# Patient Record
Sex: Male | Born: 1955
Health system: Southern US, Community
[De-identification: ages and names within clinical notes are randomized; demographics above are authoritative.]

## PROBLEM LIST (undated history)

## (undated) DIAGNOSIS — F329 Major depressive disorder, single episode, unspecified: Secondary | ICD-10-CM

## (undated) DIAGNOSIS — I5020 Unspecified systolic (congestive) heart failure: Secondary | ICD-10-CM

## (undated) DIAGNOSIS — E119 Type 2 diabetes mellitus without complications: Secondary | ICD-10-CM

## (undated) DIAGNOSIS — M1990 Unspecified osteoarthritis, unspecified site: Secondary | ICD-10-CM

## (undated) DIAGNOSIS — I10 Essential (primary) hypertension: Secondary | ICD-10-CM

## (undated) DIAGNOSIS — I2510 Atherosclerotic heart disease of native coronary artery without angina pectoris: Secondary | ICD-10-CM

## (undated) DIAGNOSIS — E785 Hyperlipidemia, unspecified: Secondary | ICD-10-CM

## (undated) DIAGNOSIS — F32A Depression, unspecified: Secondary | ICD-10-CM

## (undated) DIAGNOSIS — N183 Chronic kidney disease, stage 3 unspecified: Secondary | ICD-10-CM

## (undated) DIAGNOSIS — I77810 Thoracic aortic ectasia: Secondary | ICD-10-CM

## (undated) DIAGNOSIS — I255 Ischemic cardiomyopathy: Secondary | ICD-10-CM

## (undated) HISTORY — DX: Unspecified osteoarthritis, unspecified site: M19.90

## (undated) HISTORY — DX: Hyperlipidemia, unspecified: E78.5

## (undated) HISTORY — PX: CATARACT EXTRACTION: SUR2

## (undated) HISTORY — PX: TONSILLECTOMY: SUR1361

## (undated) HISTORY — DX: Depression, unspecified: F32.A

## (undated) HISTORY — DX: Essential (primary) hypertension: I10

## (undated) HISTORY — DX: Type 2 diabetes mellitus without complications: E11.9

---
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
## 2017-04-08 LAB — HM HEPATITIS C SCREENING LAB: HM Hepatitis Screen: NEGATIVE

## 2020-02-11 ENCOUNTER — Telehealth: Payer: Self-pay

## 2020-02-11 ENCOUNTER — Encounter (INDEPENDENT_AMBULATORY_CARE_PROVIDER_SITE_OTHER): Payer: Self-pay

## 2020-02-11 ENCOUNTER — Other Ambulatory Visit: Payer: Self-pay

## 2020-02-11 ENCOUNTER — Ambulatory Visit (INDEPENDENT_AMBULATORY_CARE_PROVIDER_SITE_OTHER): Payer: Medicare Other | Admitting: Physician Assistant

## 2020-02-11 ENCOUNTER — Encounter: Payer: Self-pay | Admitting: Physician Assistant

## 2020-02-11 VITALS — BP 138/80 | HR 77 | Temp 98.2°F | Resp 16 | Ht 70.0 in | Wt 248.0 lb

## 2020-02-11 DIAGNOSIS — IMO0002 Reserved for concepts with insufficient information to code with codable children: Secondary | ICD-10-CM

## 2020-02-11 DIAGNOSIS — E785 Hyperlipidemia, unspecified: Secondary | ICD-10-CM

## 2020-02-11 DIAGNOSIS — B070 Plantar wart: Secondary | ICD-10-CM

## 2020-02-11 DIAGNOSIS — E1122 Type 2 diabetes mellitus with diabetic chronic kidney disease: Secondary | ICD-10-CM

## 2020-02-11 DIAGNOSIS — I10 Essential (primary) hypertension: Secondary | ICD-10-CM

## 2020-02-11 DIAGNOSIS — Z794 Long term (current) use of insulin: Secondary | ICD-10-CM

## 2020-02-11 DIAGNOSIS — E1159 Type 2 diabetes mellitus with other circulatory complications: Secondary | ICD-10-CM

## 2020-02-11 DIAGNOSIS — E1140 Type 2 diabetes mellitus with diabetic neuropathy, unspecified: Secondary | ICD-10-CM | POA: Diagnosis not present

## 2020-02-11 DIAGNOSIS — E1165 Type 2 diabetes mellitus with hyperglycemia: Secondary | ICD-10-CM | POA: Diagnosis not present

## 2020-02-11 DIAGNOSIS — I152 Hypertension secondary to endocrine disorders: Secondary | ICD-10-CM

## 2020-02-11 DIAGNOSIS — Z125 Encounter for screening for malignant neoplasm of prostate: Secondary | ICD-10-CM

## 2020-02-11 DIAGNOSIS — E1169 Type 2 diabetes mellitus with other specified complication: Secondary | ICD-10-CM | POA: Diagnosis not present

## 2020-02-11 LAB — COMPREHENSIVE METABOLIC PANEL
ALT: 17 U/L (ref 0–53)
AST: 15 U/L (ref 0–37)
Albumin: 4.4 g/dL (ref 3.5–5.2)
Alkaline Phosphatase: 80 U/L (ref 39–117)
BUN: 22 mg/dL (ref 6–23)
CO2: 29 mEq/L (ref 19–32)
Calcium: 9.4 mg/dL (ref 8.4–10.5)
Chloride: 96 mEq/L (ref 96–112)
Creatinine, Ser: 1.26 mg/dL (ref 0.40–1.50)
GFR: 57.44 mL/min — ABNORMAL LOW (ref >60.00)
Glucose, Bld: 438 mg/dL — ABNORMAL HIGH (ref 70–99)
Potassium: 4.6 mEq/L (ref 3.5–5.1)
Sodium: 133 mEq/L — ABNORMAL LOW (ref 135–145)
Total Bilirubin: 0.7 mg/dL (ref 0.2–1.2)
Total Protein: 6.8 g/dL (ref 6.0–8.3)

## 2020-02-11 LAB — POCT URINALYSIS DIPSTICK
Bilirubin, UA: NEGATIVE
Blood, UA: NEGATIVE
Glucose, UA: POSITIVE — AB
Ketones, UA: POSITIVE
Leukocytes, UA: NEGATIVE
Nitrite, UA: NEGATIVE
Protein, UA: NEGATIVE
Spec Grav, UA: 1.015 (ref 1.010–1.025)
Urobilinogen, UA: 0.2 E.U./dL
pH, UA: 5.5 (ref 5.0–8.0)

## 2020-02-11 LAB — POCT GLYCOSYLATED HEMOGLOBIN (HGB A1C): Hemoglobin A1C: 14.7 % — AB (ref 4.0–5.6)

## 2020-02-11 LAB — CBC WITH DIFFERENTIAL/PLATELET
Basophils Absolute: 0 10*3/uL (ref 0.0–0.1)
Basophils Relative: 1 % (ref 0.0–3.0)
Eosinophils Absolute: 0.1 10*3/uL (ref 0.0–0.7)
Eosinophils Relative: 1.8 % (ref 0.0–5.0)
HCT: 49.5 % (ref 39.0–52.0)
Hemoglobin: 17.4 g/dL — ABNORMAL HIGH (ref 13.0–17.0)
Lymphocytes Relative: 37 % (ref 12.0–46.0)
Lymphs Abs: 1.7 10*3/uL (ref 0.7–4.0)
MCHC: 35.1 g/dL (ref 30.0–36.0)
MCV: 90.5 fl (ref 78.0–100.0)
Monocytes Absolute: 0.3 10*3/uL (ref 0.1–1.0)
Monocytes Relative: 7.3 % (ref 3.0–12.0)
Neutro Abs: 2.5 10*3/uL (ref 1.4–7.7)
Neutrophils Relative %: 52.9 % (ref 43.0–77.0)
Platelets: 172 10*3/uL (ref 150.0–400.0)
RBC: 5.47 Mil/uL (ref 4.22–5.81)
RDW: 13.3 % (ref 11.5–15.5)
WBC: 4.7 10*3/uL (ref 4.0–10.5)

## 2020-02-11 LAB — MICROALBUMIN / CREATININE URINE RATIO
Creatinine,U: 51.7 mg/dL
Microalb Creat Ratio: 11 mg/g (ref 0.0–30.0)
Microalb, Ur: 5.7 mg/dL — ABNORMAL HIGH (ref 0.0–1.9)

## 2020-02-11 LAB — LIPID PANEL
Cholesterol: 374 mg/dL — ABNORMAL HIGH (ref 0–200)
HDL: 30 mg/dL — ABNORMAL LOW (ref >39.00)
Total CHOL/HDL Ratio: 12
Triglycerides: 1665 mg/dL — ABNORMAL HIGH (ref 0.0–149.0)

## 2020-02-11 LAB — PSA: PSA: 0.68 ng/mL (ref 0.10–4.00)

## 2020-02-11 LAB — TSH: TSH: 2.95 u[IU]/mL (ref 0.35–4.50)

## 2020-02-11 LAB — POCT CBG (FASTING - GLUCOSE)-MANUAL ENTRY: Glucose Fasting, POC: 400 mg/dL — AB (ref 70–99)

## 2020-02-11 LAB — LDL CHOLESTEROL, DIRECT: Direct LDL: 81 mg/dL

## 2020-02-11 MED ORDER — BASAGLAR KWIKPEN 100 UNIT/ML ~~LOC~~ SOPN: 55.0000 [IU] | PEN_INJECTOR | Freq: Every day | SUBCUTANEOUS | 2 refills | Status: DC

## 2020-02-11 MED ORDER — ROSUVASTATIN CALCIUM 40 MG PO TABS: 40.0000 mg | ORAL_TABLET | Freq: Every day | ORAL | 3 refills | Status: DC

## 2020-02-11 MED ORDER — LOSARTAN POTASSIUM 25 MG PO TABS: 25.0000 mg | ORAL_TABLET | Freq: Every day | ORAL | 2 refills | Status: DC

## 2020-02-11 NOTE — Telephone Encounter (Signed)
Patient left a vm message only containing his name and phone number where he could be reached on the nurse's voice mail. Called patient but no answer. Left a vm message for the patient to call the office back.

## 2020-02-11 NOTE — Patient Instructions (Addendum)
Please go to the lab today for blood work.  I will call you with your results. We will alter treatment regimen(s) if indicated by your results.   We will need to get things under control as fast as possible but as safe as possible.   You need to really keep a low carb diet. I am restarting your Basaglar for now at 55 units once daily. We will be adding other agents to this but I need to assess your renal function. Take as directed.  I am sending in a freestyle libre meter for you so it will be easier to get glucose data without all the fingersticks.  I am restarting your losartan once daily and your crestor once daily.  Again we want to make sure you tolerate these things before we keep adding since you have had issue in the past.  I am setting you up with a local Endocrinologist, Nephrologist, Ophthalmologist and [place] will receive calls from these specialists. If you do not hear from them within a week at most, let me know.   I want to follow-up in 2 weeks to assess how things are going. Sooner if needed.    Diabetes Mellitus and Foot Care Foot care is an important part of your health, especially when you have diabetes. Diabetes may cause you to have problems because of poor blood flow (circulation) to your feet and legs, which can cause your skin to:  Become thinner and drier.  Break more easily.  Heal more slowly.  Peel and crack. You may also have nerve damage (neuropathy) in your legs and feet, causing decreased feeling in them. This means that you may not notice minor injuries to your feet that could lead to more serious problems. Noticing and addressing any potential problems early is the best way to prevent future foot problems. How to care for your feet Foot hygiene  Wash your feet daily with warm water and mild soap. Do not use hot water. Then, pat your feet and the areas between your toes until they are completely dry. Do not soak your feet as this can dry your  skin.  Trim your toenails straight across. Do not dig under them or around the cuticle. File the edges of your nails with an emery board or nail file.  Apply a moisturizing lotion or petroleum jelly to the skin on your feet and to dry, brittle toenails. Use lotion that does not contain alcohol and is unscented. Do not apply lotion between your toes. Shoes and socks  Wear clean socks or stockings every day. Make sure they are not too tight. Do not wear knee-high stockings since they may decrease blood flow to your legs.  Wear shoes that fit properly and have enough cushioning. Always look in your shoes before you put them on to be sure there are no objects inside.  To break in new shoes, wear them for just a few hours a day. This prevents injuries on your feet. Wounds, scrapes, corns, and calluses  Check your feet daily for blisters, cuts, bruises, sores, and redness. If you cannot see the bottom of your feet, use a mirror or ask someone for help.  Do not cut corns or calluses or try to remove them with medicine.  If you find a minor scrape, cut, or break in the skin on your feet, keep it and the skin around it clean and dry. You may clean these areas with mild soap and water. Do not clean  the area with peroxide, alcohol, or iodine.  If you have a wound, scrape, corn, or callus on your foot, look at it several times a day to make sure it is healing and not infected. Check for: ? Redness, swelling, or pain. ? Fluid or blood. ? Warmth. ? Pus or a bad smell. General instructions  Do not cross your legs. This may decrease blood flow to your feet.  Do not use heating pads or hot water bottles on your feet. They may burn your skin. If you have lost feeling in your feet or legs, you may not know this is happening until it is too late.  Protect your feet from hot and cold by wearing shoes, such as at the beach or on hot pavement.  Schedule a complete foot exam at least once a year (annually)  or more often if you have foot problems. If you have foot problems, report any cuts, sores, or bruises to your health care provider immediately. Contact a health care provider if:  You have a medical condition that increases your risk of infection and you have any cuts, sores, or bruises on your feet.  You have an injury that is not healing.  You have redness on your legs or feet.  You feel burning or tingling in your legs or feet.  You have pain or cramps in your legs and feet.  Your legs or feet are numb.  Your feet always feel cold.  You have pain around a toenail. Get help right away if:  You have a wound, scrape, corn, or callus on your foot and: ? You have pain, swelling, or redness that gets worse. ? You have fluid or blood coming from the wound, scrape, corn, or callus. ? Your wound, scrape, corn, or callus feels warm to the touch. ? You have pus or a bad smell coming from the wound, scrape, corn, or callus. ? You have a fever. ? You have a red line going up your leg. Summary  Check your feet every day for cuts, sores, red spots, swelling, and blisters.  Moisturize feet and legs daily.  Wear shoes that fit properly and have enough cushioning.  If you have foot problems, report any cuts, sores, or bruises to your health care provider immediately.  Schedule a complete foot exam at least once a year (annually) or more often if you have foot problems. This information is not intended to replace advice given to you by your health care provider. Make sure you discuss any questions you have with your health care provider. Document Revised: 05/20/2019 Document Reviewed: 09/28/2016 Elsevier Patient Education  [place]. Fee

## 2020-02-11 NOTE — Progress Notes (Signed)
Patient presents to clinic today with his wife to establish care. Patient has not seen a PCP since they moved from [place] over 6 months ago.  Per patient and wife, patient with history of DM II with hypertension and hyperlipidemia. Notes he was previously on a multi drug regimen for each condition; metoprolol, losartan and amlodipine for hypertension. Crestor and zetia for hyperlipidemia. Metformin, Novolog, Basaglar, Trulicity and [place] for DM.  Has been off of all medications for at least 6 months. Notes something was causing stomach upset so he stopped all of his medications. Wife notes she encouraged him to hold one medication at a time to see which may have been the culprit. States patient has horrible history of not taking his medications as directed and being non-adherent with treatment regimens.   Patient states he does not check glucose levels as directed. Does note history of some eye changes but unsure of dx of diabetic retinopathy. Is due for repeat eye examination. Wife notes he does have history of kidney disease 2/2 DM. Will need nephrologist in the area. Denies history of neuropathy. Denies current neuropathic symptoms. Was being managed by Endocrinology. Wife states she feels that he was on so many medications because he would tell provider he was taking medications when he really was not, they would note A1C rising and add on more medication.   In regards to hypertension and hyperlipidemia patient states he does try to watch what he eats. Patient denies chest pain, palpitations, lightheadedness, dizziness, vision changes or frequent headaches. Denies history of CVA or MI.    Health Maintenance: Immunizations -- will review records and update immunizations accordingly.  Colonoscopy -- Will need to review records.  Past Medical History:  Diagnosis Date  . Arthritis   . Depression   . Diabetes mellitus without complication ([place])   . Hyperlipidemia   . Hypertension      Past Surgical History:  Procedure Laterality Date  . TONSILLECTOMY      No current outpatient medications on file prior to visit.   No current facility-administered medications on file prior to visit.    Allergies  Allergen Reactions  . Niaspan [Niacin] Other (See Comments)    Red skin, red eyes    Family History  Problem Relation Age of Onset  . Cancer Mother   . Early death Mother   . Stroke Mother   . Diabetes Father   . Heart attack Father   . Heart disease Father   . Hyperlipidemia Father     Social History   Socioeconomic History  . Marital status: Married    Spouse name: Not on file  . Number of children: Not on file  . Years of education: Not on file  . Highest education level: Not on file  Occupational History  . Not on file  Tobacco Use  . Smoking status: Never Smoker  . Smokeless tobacco: Never Used  Vaping Use  . Vaping Use: Never used  Substance and Sexual Activity  . Alcohol use: Not Currently  . Drug use: Never  . Sexual activity: Yes  Other Topics Concern  . Not on file  Social History Narrative  . Not on file   Social Determinants of Health   Financial Resource Strain:   . Difficulty of Paying Living Expenses:   Food Insecurity:   . Worried About Charity fundraiser in the Last Year:   . Arboriculturist in the Last Year:   Transportation Needs:   .  Lack of Transportation (Medical):   Marland Kitchen Lack of Transportation (Non-Medical):   Physical Activity:   . Days of Exercise per Week:   . Minutes of Exercise per Session:   Stress:   . Feeling of Stress :   Social Connections:   . Frequency of Communication with Friends and Family:   . Frequency of Social Gatherings with Friends and Family:   . Attends Religious Services:   . Active Member of Clubs or Organizations:   . Attends Archivist Meetings:   Marland Kitchen Marital Status:   Intimate Partner Violence:   . Fear of Current or Ex-Partner:   . Emotionally Abused:   Marland Kitchen Physically  Abused:   . Sexually Abused:    ROS  Pertinent ROS are listed in the HPI  BP 138/80 (BP Location: Left Arm, Cuff Size: Large)   Pulse 77   Temp 98.2 F (36.8 C) (Temporal)   Resp 16   Ht 5\' 10"  (1.778 m)   Wt 248 lb (112.5 kg)   SpO2 97%   BMI 35.58 kg/m   Physical Exam Vitals reviewed.  Constitutional:      Appearance: Normal appearance.  HENT:     Head: Normocephalic and atraumatic.     Right Ear: Tympanic membrane normal.     Left Ear: Tympanic membrane normal.     Nose: Nose normal.     Mouth/Throat:     Mouth: Mucous membranes are moist.  Eyes:     Conjunctiva/sclera: Conjunctivae normal.     Pupils: Pupils are equal, round, and reactive to light.  Cardiovascular:     Rate and Rhythm: Normal rate and regular rhythm.     Pulses: Normal pulses.     Heart sounds: Normal heart sounds.  Pulmonary:     Effort: Pulmonary effort is normal.     Breath sounds: Normal breath sounds.  Musculoskeletal:     Cervical back: Neck supple.  Neurological:     General: No focal deficit present.     Mental Status: He is alert and oriented to person, place, and time.  Psychiatric:        Mood and Affect: Mood normal.    Diabetic Foot Form - Detailed   Diabetic Foot Exam - detailed Diabetic Foot exam was performed with the following findings: Yes 02/12/2020  7:46 AM  Can the patient see the bottom of their feet?: Yes Are the shoes appropriate in style and fit?: No Is there swelling or and abnormal foot shape?: No Is there a claw toe deformity?: No Is there elevated skin temparature?: No Is there foot or ankle muscle weakness?: No Normal Range of Motion: Yes Right posterior Tibialias: Present Left posterior Tibialias: Present  Right Dorsalis Pedis: Present Left Dorsalis Pedis: Present  Semmes-Weinstein Monofilament Test R Site 1-Great Toe: Pos L Site 1-Great Toe: Pos    Comments: Multiple plantar warts noted of ball of feet bilaterally, most are about 2 mm in diameter but a  few are up to 5 mm in diameter. These are the most symptomatic per patient.      Assessment/Plan: 1. Type 2 diabetes mellitus with diabetic neuropathy, with long-term current use of insulin ([place]) 2. Uncontrolled type II diabetes mellitus with chronic kidney disease ([place]) Discussed with patient that stopping all medications with his chronic conditions is a very bad idea. Think at this point giving all the history given, we are better off starting from scratch. POC A1C at 14.7. Restart Basaglar at 55 units daily. We  will update labs today. I want to see how renal function is at present to determine what other medications are safe to restart. Rx freestyle [place] sent in to get better reads on glucose level -- he is to check TID and record, bringing to follow-up. Referrals to Endo, Ophthalmology, Nephrology and Podiatry placed today. ARB and statin restarted along with 81 mg ASA daily. Evidence of neuropathy found on diabetic foot examination today. Discussed proper footwear, foot hygiene and monitoring of skin. Close follow-up scheduled to make further changes while awaiting Endocrinology assessment.  - Comprehensive metabolic panel - Lipid panel - Urine Microalbumin w/creat. ratio - Ambulatory referral to Podiatry - Ambulatory referral to Endocrinology - Ambulatory referral to Ophthalmology - POCT CBG (Fasting - Glucose) - POCT Urinalysis Dipstick  3. Hypertension associated with diabetes ([place]) BP with only mild elevation. Will restart losartan at prior dose. Start DASH diet. Labs as noted below. Want to obtain/review prior records to see if the other agents were solely for HTN or if there are other compelling indications that I am unaware of. Will adjust according to findings. Follow-up for repeat assessment of BP scheduled.  - CBC with Differential/Platelet - Comprehensive metabolic panel - Lipid panel - TSH  4. Hyperlipidemia associated with type 2 diabetes mellitus ([place]) Restart Crestor at  prior dose. Begin 81 mg ASA daily. Fasting labs today. - POCT HgB A1C - Comprehensive metabolic panel - Lipid panel  5. Prostate cancer screening The meaning of a false positive PSA and a false negative PSA has been discussed. He indicates understanding of the limitations of this screening test and wishes to proceed with screening PSA testing.  - PSA  6. Plantar warts Noted on diabetic foot examination today. Patient and wife have been trying to cut out themselves. Directed to stop immediately due to risk of diabetic foot infection. No sign of infection today. Will need referral to Podiatry for diabetic foot care and removal of warts. 1 application of Cryotherapy applied today in office to 4 larger lesions to help them shrink prior to podiatry management. Home care reviewed with patient and wife.  - Ambulatory referral to [place], PA-C

## 2020-02-17 ENCOUNTER — Telehealth: Payer: Self-pay

## 2020-02-17 DIAGNOSIS — IMO0002 Reserved for concepts with insufficient information to code with codable children: Secondary | ICD-10-CM

## 2020-02-17 MED ORDER — FREESTYLE LIBRE 14 DAY SENSOR MISC: 3 refills | Status: AC

## 2020-02-17 MED ORDER — FREESTYLE LIBRE 14 DAY READER DEVI: 0 refills | Status: AC

## 2020-02-17 NOTE — Telephone Encounter (Signed)
Rx for the meter and sensors has been sent.

## 2020-02-17 NOTE — Telephone Encounter (Signed)
Patient said he needs a prescription sent to the pharmacy for a [facility].

## 2020-02-17 NOTE — Telephone Encounter (Signed)
Called patient made aware that prescription had been sent to pharmacy

## 2020-02-22 DIAGNOSIS — E785 Hyperlipidemia, unspecified: Secondary | ICD-10-CM | POA: Insufficient documentation

## 2020-02-22 DIAGNOSIS — IMO0002 Reserved for concepts with insufficient information to code with codable children: Secondary | ICD-10-CM | POA: Insufficient documentation

## 2020-02-22 DIAGNOSIS — I152 Hypertension secondary to endocrine disorders: Secondary | ICD-10-CM | POA: Insufficient documentation

## 2020-02-22 DIAGNOSIS — E1169 Type 2 diabetes mellitus with other specified complication: Secondary | ICD-10-CM | POA: Insufficient documentation

## 2020-02-22 DIAGNOSIS — E119 Type 2 diabetes mellitus without complications: Secondary | ICD-10-CM | POA: Insufficient documentation

## 2020-02-22 DIAGNOSIS — E1140 Type 2 diabetes mellitus with diabetic neuropathy, unspecified: Secondary | ICD-10-CM | POA: Insufficient documentation

## 2020-02-22 DIAGNOSIS — B070 Plantar wart: Secondary | ICD-10-CM | POA: Insufficient documentation

## 2020-02-22 DIAGNOSIS — Z125 Encounter for screening for malignant neoplasm of prostate: Secondary | ICD-10-CM | POA: Insufficient documentation

## 2020-02-22 DIAGNOSIS — E1159 Type 2 diabetes mellitus with other circulatory complications: Secondary | ICD-10-CM | POA: Insufficient documentation

## 2020-02-25 ENCOUNTER — Encounter: Payer: Self-pay | Admitting: Physician Assistant

## 2020-02-25 ENCOUNTER — Ambulatory Visit (INDEPENDENT_AMBULATORY_CARE_PROVIDER_SITE_OTHER): Payer: Medicare Other | Admitting: Physician Assistant

## 2020-02-25 ENCOUNTER — Other Ambulatory Visit: Payer: Self-pay

## 2020-02-25 VITALS — BP 140/78 | HR 64 | Temp 97.6°F | Wt 258.4 lb

## 2020-02-25 DIAGNOSIS — E1140 Type 2 diabetes mellitus with diabetic neuropathy, unspecified: Secondary | ICD-10-CM | POA: Diagnosis not present

## 2020-02-25 DIAGNOSIS — E1169 Type 2 diabetes mellitus with other specified complication: Secondary | ICD-10-CM | POA: Diagnosis not present

## 2020-02-25 DIAGNOSIS — I10 Essential (primary) hypertension: Secondary | ICD-10-CM | POA: Diagnosis not present

## 2020-02-25 DIAGNOSIS — E1159 Type 2 diabetes mellitus with other circulatory complications: Secondary | ICD-10-CM

## 2020-02-25 DIAGNOSIS — I152 Hypertension secondary to endocrine disorders: Secondary | ICD-10-CM

## 2020-02-25 DIAGNOSIS — Z794 Long term (current) use of insulin: Secondary | ICD-10-CM

## 2020-02-25 DIAGNOSIS — E785 Hyperlipidemia, unspecified: Secondary | ICD-10-CM

## 2020-02-25 MED ORDER — LOSARTAN POTASSIUM 50 MG PO TABS
50.0000 mg | ORAL_TABLET | Freq: Every day | ORAL | 0 refills | Status: DC
Start: 2020-02-25 — End: 2020-05-18

## 2020-02-25 NOTE — Progress Notes (Signed)
Patient presents to clinic today for follow-up of chronic medical conditions after being restarted on medication regimen at last visit 2 weeks ago.  Patient with history of type 2 diabetes uncontrolled with chronic kidney disease and neuropathy, hyperlipidemia and hypertension.  Patient had stopped all of his medicines due to nausea, unsure which was the culprit.  At last visit labs were obtained, patient was restarted on losartan 25 mg daily, rosuvastatin 40 mg daily, baby aspirin and, and Basaglar 55 units once daily.  A prescription for freestyle libre meter sent in for more consistent glucose checks.  Patient was also referred to endocrinology, ophthalmology, podiatry and nephrology given his uncontrolled diabetes with complications.  Since last visit, patient endorses taking medications as directed and tolerating well.  Did receive his freestyle libre meter but states the sensors he has tried all read error.  As such she has not been able to check glucose frequently. Patient denies chest pain, palpitations, lightheadedness, dizziness, vision changes or frequent headaches.  Patient endorses trying to keep a lower carb diet.  Is keeping well-hydrated.  Notes staying very active at work.  Patient already has appointment scheduled with podiatry and nephrology next week.  Has ophthalmology appointment pending.  States he has been contacted by endocrinology but has not called them back yet. Patient denies chest pain, palpitations, lightheadedness, dizziness, vision changes or frequent headaches.      Past Medical History:  Diagnosis Date  . Arthritis   . Depression   . Diabetes mellitus without complication ([place])   . Hyperlipidemia   . Hypertension     Current Outpatient Medications on File Prior to Visit  Medication Sig Dispense Refill  . Continuous Blood Gluc Receiver (FREESTYLE LIBRE 14 DAY READER) DEVI Use to check glucose levels TID; E11.9 1 each 0  . Continuous Blood Gluc Sensor  (FREESTYLE LIBRE 14 DAY SENSOR) MISC Use to check glucose TID. Change every 14 days. E 11.9 6 each 3  . Insulin Glargine (BASAGLAR KWIKPEN) 100 UNIT/ML Inject 0.55 mLs (55 Units total) into the skin daily. 15 mL 2  . losartan (COZAAR) 25 MG tablet Take 1 tablet (25 mg total) by mouth daily. 30 tablet 2  . rosuvastatin (CRESTOR) 40 MG tablet Take 1 tablet (40 mg total) by mouth daily. 30 tablet 3   No current facility-administered medications on file prior to visit.    Allergies  Allergen Reactions  . Niaspan [Niacin] Other (See Comments)    Red skin, red eyes    Family History  Problem Relation Age of Onset  . Cancer Mother   . Early death Mother   . Stroke Mother   . Diabetes Father   . Heart attack Father   . Heart disease Father   . Hyperlipidemia Father     Social History   Socioeconomic History  . Marital status: Married    Spouse name: Not on file  . Number of children: Not on file  . Years of education: Not on file  . Highest education level: Not on file  Occupational History  . Not on file  Tobacco Use  . Smoking status: Never Smoker  . Smokeless tobacco: Never Used  Vaping Use  . Vaping Use: Never used  Substance and Sexual Activity  . Alcohol use: Not Currently  . Drug use: Never  . Sexual activity: Yes  Other Topics Concern  . Not on file  Social History Narrative  . Not on file   Social Determinants of Health  Financial Resource Strain:   . Difficulty of Paying Living Expenses:   Food Insecurity:   . Worried About Charity fundraiser in the Last Year:   . Arboriculturist in the Last Year:   Transportation Needs:   . Film/video editor (Medical):   Marland Kitchen Lack of Transportation (Non-Medical):   Physical Activity:   . Days of Exercise per Week:   . Minutes of Exercise per Session:   Stress:   . Feeling of Stress :   Social Connections:   . Frequency of Communication with Friends and Family:   . Frequency of Social Gatherings with Friends and  Family:   . Attends Religious Services:   . Active Member of Clubs or Organizations:   . Attends Archivist Meetings:   Marland Kitchen Marital Status:     Review of Systems - See HPI.  All other ROS are negative.  BP (!) 148/78   Pulse 64   Temp 97.6 F (36.4 C) (Temporal)   Wt 258 lb 6.4 oz (117.2 kg)   SpO2 97%   BMI 37.08 kg/m   Physical Exam Vitals reviewed.  Constitutional:      Appearance: Normal appearance.  HENT:     Head: Normocephalic and atraumatic.     Mouth/Throat:     Mouth: Mucous membranes are moist.  Eyes:     Conjunctiva/sclera: Conjunctivae normal.     Pupils: Pupils are equal, round, and reactive to light.  Cardiovascular:     Rate and Rhythm: Normal rate and regular rhythm.     Pulses: Normal pulses.     Heart sounds: Normal heart sounds.  Pulmonary:     Effort: Pulmonary effort is normal.     Breath sounds: Normal breath sounds.  Musculoskeletal:     Cervical back: Neck supple.  Neurological:     General: No focal deficit present.     Mental Status: He is alert and oriented to person, place, and time.  Psychiatric:        Mood and Affect: Mood normal.     Recent Results (from the past 2160 hour(s))  POCT HgB A1C     Status: Abnormal   Collection Time: 02/11/20  9:48 AM  Result Value Ref Range   Hemoglobin A1C 14.7 (A) 4.0 - 5.6 %   HbA1c POC (<> result, manual entry)     HbA1c, POC (prediabetic range)     HbA1c, POC (controlled diabetic range)    CBC with Differential/Platelet     Status: Abnormal   Collection Time: 02/11/20 10:37 AM  Result Value Ref Range   WBC 4.7 4.0 - 10.5 K/uL   RBC 5.47 4.22 - 5.81 Mil/uL   Hemoglobin 17.4 (H) 13.0 - 17.0 g/dL   HCT 49.5 39 - 52 %   MCV 90.5 78.0 - 100.0 fl   MCHC 35.1 30.0 - 36.0 g/dL   RDW 13.3 11.5 - 15.5 %   Platelets 172.0 Repeated and verified X2. 150 - 400 K/uL   Neutrophils Relative % 52.9 43 - 77 %   Lymphocytes Relative 37.0 12 - 46 %   Monocytes Relative 7.3 3 - 12 %    Eosinophils Relative 1.8 0 - 5 %   Basophils Relative 1.0 0 - 3 %   Neutro Abs 2.5 1.4 - 7.7 K/uL   Lymphs Abs 1.7 0.7 - 4.0 K/uL   Monocytes Absolute 0.3 0 - 1 K/uL   Eosinophils Absolute 0.1 0 - 0 K/uL  Basophils Absolute 0.0 0 - 0 K/uL  Comprehensive metabolic panel     Status: Abnormal   Collection Time: 02/11/20 10:37 AM  Result Value Ref Range   Sodium 133 (L) 135 - 145 mEq/L   Potassium 4.6 3.5 - 5.1 mEq/L   Chloride 96 96 - 112 mEq/L   CO2 29 19 - 32 mEq/L   Glucose, Bld 438 (H) 70 - 99 mg/dL   BUN 22 6 - 23 mg/dL   Creatinine, Ser 1.26 0.40 - 1.50 mg/dL   Total Bilirubin 0.7 0.2 - 1.2 mg/dL   Alkaline Phosphatase 80 39 - 117 U/L   AST 15 0 - 37 U/L   ALT 17 0 - 53 U/L   Total Protein 6.8 6.0 - 8.3 g/dL   Albumin 4.4 3.5 - 5.2 g/dL   GFR 57.44 (L) >60.00 mL/min   Calcium 9.4 8.4 - 10.5 mg/dL  Lipid panel     Status: Abnormal   Collection Time: 02/11/20 10:37 AM  Result Value Ref Range   Cholesterol 374 (H) 0 - 200 mg/dL    Comment: ATP III Classification       Desirable:  < 200 mg/dL               Borderline High:  200 - 239 mg/dL          High:  > = 240 mg/dL   Triglycerides (H) 0 - 149 mg/dL    1665.0 Triglyceride is over 400; calculations on Lipids are invalid.    Comment: Normal:  <150 mg/dLBorderline High:  150 - 199 mg/dL   HDL 30.00 (L) >39.00 mg/dL   Total CHOL/HDL Ratio 12     Comment:                Men          Women1/2 Average Risk     3.4          3.3Average Risk          5.0          4.42X Average Risk          9.6          7.13X Average Risk          15.0          11.0                      TSH     Status: None   Collection Time: 02/11/20 10:37 AM  Result Value Ref Range   TSH 2.95 0.35 - 4.50 uIU/mL  PSA     Status: None   Collection Time: 02/11/20 10:37 AM  Result Value Ref Range   PSA 0.68 0.10 - 4.00 ng/mL    Comment: Test performed using Access Hybritech PSA Assay, a parmagnetic partical, chemiluminecent immunoassay.  Urine Microalbumin  w/creat. ratio     Status: Abnormal   Collection Time: 02/11/20 10:37 AM  Result Value Ref Range   Microalb, Ur 5.7 (H) 0.0 - 1.9 mg/dL   Creatinine,U 51.7 mg/dL   Microalb Creat Ratio 11.0 0.0 - 30.0 mg/g  LDL cholesterol, direct     Status: None   Collection Time: 02/11/20 10:37 AM  Result Value Ref Range   Direct LDL 81.0 mg/dL    Comment: Optimal:  <100 mg/dLNear or Above Optimal:  100-129 mg/dLBorderline High:  130-159 mg/dLHigh:  160-189 mg/dLVery High:  >190 mg/dL  POCT CBG (Fasting - Glucose)  Status: Abnormal   Collection Time: 02/11/20 10:41 AM  Result Value Ref Range   Glucose Fasting, POC 400 (A) 70 - 99 mg/dL  POCT Urinalysis Dipstick     Status: Abnormal   Collection Time: 02/11/20 10:58 AM  Result Value Ref Range   Color, UA lighg yellow    Clarity, UA clear    Glucose, UA Positive (A) Negative    Comment: 3+   Bilirubin, UA Negative    Ketones, UA Positive, 5    Spec Grav, UA 1.015 1.010 - 1.025   Blood, UA Negative    pH, UA 5.5 5.0 - 8.0   Protein, UA Negative Negative   Urobilinogen, UA 0.2 0.2 or 1.0 E.U./dL   Nitrite, UA Negative    Leukocytes, UA Negative Negative   Appearance     Odor      Assessment/Plan: 1. Hypertension associated with diabetes ([place]) BP improved on recheck.  Still above goal given diabetes.  Will increase losartan to 50 mg daily.  Continue DASH diet.  Follow-up 4 weeks for reassessment.  2. Type 2 diabetes mellitus with diabetic neuropathy, with long-term current use of insulin ([place]) Patient given a temporary meter to use at home for now, checking glucose 3 times daily.  He is to reach out to his pharmacy regarding his freestyle libre sensors to get a new package of these.  He is to record glucose over the next week as directed and contact us with these readings so we can make further adjustments.  Telephone number for endocrinology given to patient.  He is going to call and set up an appointment.  3. Hyperlipidemia associated  with type 2 diabetes mellitus ([place]) Tolerating statin well.  We will continue current dose along with 81 mg ASA.  Follow-up in a month we will repeat fasting lipids and LFTs and adjust dosage according.  Goal LDL less than 70.  This visit occurred during the SARS-CoV-2 public health emergency.  Safety protocols were in place, including screening questions prior to the visit, additional usage of staff PPE, and extensive cleaning of exam room while observing appropriate contact time as indicated for disinfecting solutions.    Leeanne Rio, PA-C

## 2020-02-25 NOTE — Patient Instructions (Addendum)
Please follow-up with the Podiatrist and the Nephrologist (Kidney specialist as scheduled).   The number for Endocrinology is [phone]. They have been trying to reach you to set up appointment.   For now use the meter and strips I have given to check you glucose twice daily. Take the [facility] [place] sensors back to the pharmacy -- they should switch these out for you since they are a bad batch. Let me know if you have any issue.   Continue the insulin as directed for now. I want you to message me via MyChart or call in a week to give [place] an update on glucose levels so we can make further adjustments.   Continue the cholesterol medication as directed.   Increase your losartan to 2 tablets (50 mg) daily until you run out. I am sending in a script for the 50 mg dose to take daily as directed once you run out of the 25 mg tablets.   Let's follow-up in 1 month. Sooner for the skin tag removal if you would like.    DASH Eating Plan DASH stands for "Dietary Approaches to Stop Hypertension." The DASH eating plan is a healthy eating plan that has been shown to reduce high blood pressure (hypertension). It may also reduce your risk for type 2 diabetes, heart disease, and stroke. The DASH eating plan may also help with weight loss. What are tips for following this plan?  General guidelines  Avoid eating more than 2,300 mg (milligrams) of salt (sodium) a day. If you have hypertension, you may need to reduce your sodium intake to 1,500 mg a day.  Limit alcohol intake to no more than 1 drink a day for nonpregnant women and 2 drinks a day for men. One drink equals 12 oz of beer, 5 oz of wine, or 1 oz of hard liquor.  Work with your health care provider to maintain a healthy body weight or to lose weight. Ask what an ideal weight is for you.  Get at least 30 minutes of exercise that causes your heart to beat faster (aerobic exercise) most days of the week. Activities may include walking, swimming,  or biking.  Work with your health care provider or diet and nutrition specialist (dietitian) to adjust your eating plan to your individual calorie needs. Reading food labels   Check food labels for the amount of sodium per serving. Choose foods with less than 5 percent of the Daily Value of sodium. Generally, foods with less than 300 mg of sodium per serving fit into this eating plan.  To find whole grains, look for the word "whole" as the first word in the ingredient list. Shopping  Buy products labeled as "low-sodium" or "no salt added."  Buy fresh foods. Avoid canned foods and premade or frozen meals. Cooking  Avoid adding salt when cooking. Use salt-free seasonings or herbs instead of table salt or sea salt. Check with your health care provider or pharmacist before using salt substitutes.  Do not fry foods. Cook foods using healthy methods such as baking, boiling, grilling, and broiling instead.  Cook with heart-healthy oils, such as olive, canola, soybean, or sunflower oil. Meal planning  Eat a balanced diet that includes: ? 5 or more servings of fruits and vegetables each day. At each meal, try to fill half of your plate with fruits and vegetables. ? Up to 6-8 servings of whole grains each day. ? Less than 6 oz of lean meat, poultry, or fish each day. A  3-oz serving of meat is about the same size as a deck of cards. One egg equals 1 oz. ? 2 servings of low-fat dairy each day. ? A serving of nuts, seeds, or beans 5 times each week. ? Heart-healthy fats. Healthy fats called Omega-3 fatty acids are found in foods such as flaxseeds and coldwater fish, like sardines, salmon, and mackerel.  Limit how much you eat of the following: ? Canned or prepackaged foods. ? Food that is high in trans fat, such as fried foods. ? Food that is high in saturated fat, such as fatty meat. ? Sweets, desserts, sugary drinks, and other foods with added sugar. ? Full-fat dairy products.  Do not salt  foods before eating.  Try to eat at least 2 vegetarian meals each week.  Eat more home-cooked food and less restaurant, buffet, and fast food.  When eating at a restaurant, ask that your food be prepared with less salt or no salt, if possible. What foods are recommended? The items listed may not be a complete list. Talk with your dietitian about what dietary choices are best for you. Grains Whole-grain or whole-wheat bread. Whole-grain or whole-wheat pasta. Brown rice. [place]. Bulgur. Whole-grain and low-sodium cereals. Pita bread. Low-fat, low-sodium crackers. Whole-wheat flour tortillas. Vegetables Fresh or frozen vegetables (raw, steamed, roasted, or grilled). Low-sodium or reduced-sodium tomato and vegetable juice. Low-sodium or reduced-sodium tomato sauce and tomato paste. Low-sodium or reduced-sodium canned vegetables. Fruits All fresh, dried, or frozen fruit. Canned fruit in natural juice (without added sugar). Meat and other protein foods Skinless chicken or [place]. Ground chicken or [place]. Pork with fat trimmed off. Fish and seafood. Egg whites. Dried beans, peas, or lentils. Unsalted nuts, nut butters, and seeds. Unsalted canned beans. Lean cuts of beef with fat trimmed off. Low-sodium, lean deli meat. Dairy Low-fat (1%) or fat-free (skim) milk. Fat-free, low-fat, or reduced-fat cheeses. Nonfat, low-sodium ricotta or cottage cheese. Low-fat or nonfat yogurt. Low-fat, low-sodium cheese. Fats and oils Soft margarine without trans fats. Vegetable oil. Low-fat, reduced-fat, or light mayonnaise and salad dressings (reduced-sodium). Canola, safflower, olive, soybean, and sunflower oils. Avocado. Seasoning and other foods Herbs. Spices. Seasoning mixes without salt. Unsalted popcorn and pretzels. Fat-free sweets. What foods are not recommended? The items listed may not be a complete list. Talk with your dietitian about what dietary choices are best for you. Grains Baked goods  made with fat, such as croissants, muffins, or some breads. Dry pasta or rice meal packs. Vegetables Creamed or fried vegetables. Vegetables in a cheese sauce. Regular canned vegetables (not low-sodium or reduced-sodium). Regular canned tomato sauce and paste (not low-sodium or reduced-sodium). Regular tomato and vegetable juice (not low-sodium or reduced-sodium). Angie Fava. Olives. Fruits Canned fruit in a light or heavy syrup. Fried fruit. Fruit in cream or butter sauce. Meat and other protein foods Fatty cuts of meat. Ribs. Fried meat. Berniece Salines. Sausage. Bologna and other processed lunch meats. Salami. Fatback. Hotdogs. Bratwurst. Salted nuts and seeds. Canned beans with added salt. Canned or smoked fish. Whole eggs or egg yolks. Chicken or [place] with skin. Dairy Whole or 2% milk, cream, and half-and-half. Whole or full-fat cream cheese. Whole-fat or sweetened yogurt. Full-fat cheese. Nondairy creamers. Whipped toppings. Processed cheese and cheese spreads. Fats and oils Butter. Stick margarine. Lard. Shortening. Ghee. Bacon fat. Tropical oils, such as coconut, palm kernel, or palm oil. Seasoning and other foods Salted popcorn and pretzels. Onion salt, garlic salt, seasoned salt, table salt, and sea salt. Worcestershire sauce. Tartar sauce.  Barbecue sauce. Teriyaki sauce. Soy sauce, including reduced-sodium. Steak sauce. Canned and packaged gravies. Fish sauce. Oyster sauce. Cocktail sauce. Horseradish that you find on the shelf. Ketchup. Mustard. Meat flavorings and tenderizers. Bouillon cubes. Hot sauce and Tabasco sauce. Premade or packaged marinades. Premade or packaged taco seasonings. Relishes. Regular salad dressings. Where to find more information:  National Heart, Lung, and [place]: https://wilson-eaton.com/  American Heart Association: www.heart.org Summary  The DASH eating plan is a healthy eating plan that has been shown to reduce high blood pressure (hypertension). It may also reduce  your risk for type 2 diabetes, heart disease, and stroke.  With the DASH eating plan, you should limit salt (sodium) intake to 2,300 mg a day. If you have hypertension, you may need to reduce your sodium intake to 1,500 mg a day.  When on the DASH eating plan, aim to eat more fresh fruits and vegetables, whole grains, lean proteins, low-fat dairy, and heart-healthy fats.  Work with your health care provider or diet and nutrition specialist (dietitian) to adjust your eating plan to your individual calorie needs. This information is not intended to replace advice given to you by your health care provider. Make sure you discuss any questions you have with your health care provider. Document Revised: 08/09/2017 Document Reviewed: 08/20/2016 Elsevier Patient Education  2020 [employer]. =

## 2020-03-02 ENCOUNTER — Encounter (INDEPENDENT_AMBULATORY_CARE_PROVIDER_SITE_OTHER): Payer: Self-pay | Admitting: Ophthalmology

## 2020-03-02 ENCOUNTER — Other Ambulatory Visit: Payer: Self-pay

## 2020-03-02 ENCOUNTER — Ambulatory Visit (INDEPENDENT_AMBULATORY_CARE_PROVIDER_SITE_OTHER): Payer: Medicare Other | Admitting: Ophthalmology

## 2020-03-02 DIAGNOSIS — E113591 Type 2 diabetes mellitus with proliferative diabetic retinopathy without macular edema, right eye: Secondary | ICD-10-CM

## 2020-03-02 DIAGNOSIS — E113592 Type 2 diabetes mellitus with proliferative diabetic retinopathy without macular edema, left eye: Secondary | ICD-10-CM | POA: Diagnosis not present

## 2020-03-02 MED ORDER — FLUORESCEIN SODIUM 10 % IV SOLN
500.0000 mg | INTRAVENOUS | Status: AC | PRN
  Administered 2020-03-02: 500 mg via INTRAVENOUS

## 2020-03-02 NOTE — Progress Notes (Signed)
03/02/2020     CHIEF COMPLAINT Patient presents for Diabetic Eye Exam   HISTORY OF PRESENT ILLNESS: Andrew Pruitt is a 64 y.o. male who presents to the clinic today for:   HPI    Diabetic Eye Exam    Vision is stable.  Associated Symptoms Negative for Flashes and Floaters.  Diabetes characteristics include Type 2.  This started 20 years ago.  Blood sugar level is uncontrolled.  Last Blood Glucose 275.  Last A1C 14.  Associated Diagnosis Kidney Disease and Neuropathy.          Comments    Referred by [facility] - Patient states that he has poor vision OU at a distance. Patient states he knows that he has 'busted blood vessels in the back'. Patients wife states that every time he gets new glasses he states they do not work. LBS 275 /// A1C 14       Last edited by Gerda Diss on 03/02/2020  8:59 AM. (History)      Referring physician: Brunetta Jeans, PA-C [address] [place],  [place] [id number]  HISTORICAL INFORMATION:   Selected notes from the MEDICAL RECORD NUMBER    Lab Results  Component Value Date   HGBA1C 14.7 (A) 02/11/2020     CURRENT MEDICATIONS: No current outpatient medications on file. (Ophthalmic Drugs)   No current facility-administered medications for this visit. (Ophthalmic Drugs)   Current Outpatient Medications (Other)  Medication Sig  . Continuous Blood Gluc Receiver (FREESTYLE LIBRE 14 DAY READER) DEVI Use to check glucose levels TID; E11.9  . Continuous Blood Gluc Sensor (FREESTYLE LIBRE 14 DAY SENSOR) MISC Use to check glucose TID. Change every 14 days. E 11.9  . Insulin Glargine (BASAGLAR KWIKPEN) 100 UNIT/ML Inject 0.55 mLs (55 Units total) into the skin daily.  Marland Kitchen losartan (COZAAR) 50 MG tablet Take 1 tablet (50 mg total) by mouth daily.  . rosuvastatin (CRESTOR) 40 MG tablet Take 1 tablet (40 mg total) by mouth daily.   No current facility-administered medications for this visit. (Other)      REVIEW OF  SYSTEMS:    ALLERGIES Allergies  Allergen Reactions  . Niaspan [Niacin] Other (See Comments)    Red skin, red eyes    PAST MEDICAL HISTORY Past Medical History:  Diagnosis Date  . Arthritis   . Depression   . Diabetes mellitus without complication ([place])   . Hyperlipidemia   . Hypertension    Past Surgical History:  Procedure Laterality Date  . TONSILLECTOMY      FAMILY HISTORY Family History  Problem Relation Age of Onset  . Cancer Mother        Uterine   . Early death Mother   . Stroke Mother   . Diabetes Father   . Heart attack Father   . Heart disease Father   . Hyperlipidemia Father     SOCIAL HISTORY Social History   Tobacco Use  . Smoking status: Never Smoker  . Smokeless tobacco: Never Used  Vaping Use  . Vaping Use: Never used  Substance Use Topics  . Alcohol use: Not Currently  . Drug use: Never         OPHTHALMIC EXAM:  Base Eye Exam    Visual Acuity (Snellen - Linear)      Right Left   Dist [place] 20/30+2 20/40   Dist ph [place] 20/20-2 20/30       Tonometry (Tonopen, 9:05 AM)  Right Left   Pressure 19 18       Pupils      Pupils Dark Light Shape React APD   Right PERRL 5 4 Round Sluggish None   Left PERRL 5 4 Round Sluggish None       Visual Fields (Counting fingers)      Left Right    Full Full       Extraocular Movement      Right Left    Full Full       Neuro/Psych    Oriented x3: Yes   Mood/Affect: Normal       Dilation    Both eyes: 1.0% Mydriacyl, 2.5% Phenylephrine @ 9:05 AM        Slit Lamp and Fundus Exam    External Exam      Right Left   External Normal Normal       Slit Lamp Exam      Right Left   Lids/Lashes Normal Normal   Conjunctiva/Sclera White and quiet White and quiet   Cornea Clear Clear   Anterior Chamber Deep and quiet Deep and quiet   Iris Round and reactive Round and reactive   Lens 1+ Nuclear sclerosis 1+ Nuclear sclerosis   Anterior Vitreous Normal Normal       Fundus Exam       Right Left   Posterior Vitreous Normal Normal   Disc Normal Normal   C/D Ratio 0.3 0.45   Macula Microaneurysms, no macular thickening Microaneurysms, no macular thickening   Vessels NPDR-Severe NPDR-Severe   Periphery Normal Normal          IMAGING AND PROCEDURES  Imaging and Procedures for 03/02/20  Fluorescein Angiography Optos (Transit OS)       Injection:  500 mg Fluorescein Sodium 10 % injection   NDC: [id number]   Route: Intravenous, Site: Left ArmRight Eye   Progression has no prior data. Early phase findings include leakage. Mid/Late phase findings include leakage, retinal neovascularization.   Left Eye   Progression has no prior data. Early phase findings include leakage, delayed filling, microaneurysm. Mid/Late phase findings include leakage, window defect, retinal neovascularization.   Notes OS, with extensive peripheral nonperfusion capillary dropout and neovascularization elsewhere superotemporal to the nerve as well as along the inferotemporal vascular arcade.  This indicates the need for intravitreal Avastin to control followed thereafter by peripheral panretinal photocoagulation to long-term control of his neovascular disease from capillary dropout and to decrease vegF release.  OD with very severe NPDR and very small areas of neovascularization elsewhere that secondary to capillary dropout       Color Fundus Photography Optos - OU - Both Eyes       Right Eye Progression has no prior data. Macula : microaneurysms. Vessels : Neovascularization. Periphery : neovascularization.   Left Eye Progression has no prior data. Disc findings include hemorrhage. Macula : microaneurysms. Vessels : Neovascularization. Periphery : neovascularization.   Notes Severe diabetic retinopathy OU, currently clear media.  Extensive dot blot hemorrhages microaneurysms and IRMA                ASSESSMENT/PLAN:  No problem-specific Assessment & Plan notes  found for this encounter.      ICD-10-CM   1. Proliferative diabetic retinopathy of left eye without macular edema associated with type 2 diabetes mellitus (HCC)  E26.8341 Fluorescein Angiography Optos (Transit OS)    Fluorescein Sodium 10 % injection 500 mg    Color Fundus Photography Optos -  OU - Both Eyes  2. Proliferative diabetic retinopathy of right eye without macular edema associated with type 2 diabetes mellitus (HCC)  W97.9480 Fluorescein Angiography Optos (Transit OS)    Fluorescein Sodium 10 % injection 500 mg    Color Fundus Photography Optos - OU - Both Eyes    1.  We will schedule intravitreal Avastin OS in the near future.  2.  Thereafter will follow up with peripheral panretinal photocoagulation left eye.  3.  Very severe NPDR OD with early in NVE, early PDR, will also deliver Avastin intravitreal OD as the patient attempts to control his blood sugar.  .  Patient understands it takes 2 years of excellent blood sugar control before the progression of disease arrest due to previous accumulation of damage.   Ophthalmic Meds Ordered this visit:  Meds ordered this encounter  Medications  . Fluorescein Sodium 10 % injection 500 mg       Return in about 1 week (around 03/09/2020) for OS, AVASTIN OCT.  There are no Patient Instructions on file for this visit.   Explained the diagnoses, plan, and follow up with the patient and they expressed understanding.  Patient expressed understanding of the importance of proper follow up care.   Clent Demark Mariona Scholes M.D. Diseases & Surgery of the Retina and Vitreous Retina & Diabetic [place] 03/02/20     Abbreviations: M myopia (nearsighted); A astigmatism; H hyperopia (farsighted); P presbyopia; Mrx spectacle prescription;  CTL contact lenses; OD right eye; OS left eye; OU both eyes  XT exotropia; ET esotropia; PEK punctate epithelial keratitis; PEE punctate epithelial erosions; DES dry eye syndrome; MGD meibomian gland  dysfunction; ATs artificial tears; PFAT's preservative free artificial tears; [place] nuclear sclerotic cataract; PSC posterior subcapsular cataract; ERM epi-retinal membrane; PVD posterior vitreous detachment; RD retinal detachment; DM diabetes mellitus; DR diabetic retinopathy; NPDR non-proliferative diabetic retinopathy; PDR proliferative diabetic retinopathy; CSME clinically significant macular edema; DME diabetic macular edema; dbh dot blot hemorrhages; CWS cotton wool spot; POAG primary open angle glaucoma; C/D cup-to-disc ratio; HVF humphrey visual field; GVF goldmann visual field; OCT optical coherence tomography; IOP intraocular pressure; BRVO Branch retinal vein occlusion; CRVO central retinal vein occlusion; CRAO central retinal artery occlusion; BRAO branch retinal artery occlusion; RT retinal tear; SB scleral buckle; PPV pars plana vitrectomy; VH Vitreous hemorrhage; PRP panretinal laser photocoagulation; IVK intravitreal kenalog; VMT vitreomacular traction; MH Macular hole;  NVD neovascularization of the disc; NVE neovascularization elsewhere; AREDS age related eye disease study; ARMD age related macular degeneration; POAG primary open angle glaucoma; EBMD epithelial/anterior basement membrane dystrophy; ACIOL anterior chamber intraocular lens; IOL intraocular lens; PCIOL posterior chamber intraocular lens; Phaco/IOL phacoemulsification with intraocular lens placement; [place] photorefractive keratectomy; LASIK laser assisted in situ keratomileusis; HTN hypertension; DM diabetes mellitus; COPD chronic obstructive pulmonary disease

## 2020-03-04 ENCOUNTER — Ambulatory Visit (INDEPENDENT_AMBULATORY_CARE_PROVIDER_SITE_OTHER): Payer: Medicare Other | Admitting: Endocrinology

## 2020-03-04 ENCOUNTER — Encounter: Payer: Self-pay | Admitting: Endocrinology

## 2020-03-04 ENCOUNTER — Other Ambulatory Visit: Payer: Self-pay

## 2020-03-04 VITALS — BP 134/80 | HR 73 | Ht 70.0 in | Wt 258.8 lb

## 2020-03-04 DIAGNOSIS — E1140 Type 2 diabetes mellitus with diabetic neuropathy, unspecified: Secondary | ICD-10-CM

## 2020-03-04 DIAGNOSIS — Z794 Long term (current) use of insulin: Secondary | ICD-10-CM | POA: Diagnosis not present

## 2020-03-04 MED ORDER — BASAGLAR KWIKPEN 100 UNIT/ML ~~LOC~~ SOPN: 65.0000 [IU] | PEN_INJECTOR | SUBCUTANEOUS | 2 refills | Status: DC

## 2020-03-04 NOTE — Patient Instructions (Addendum)
good diet and exercise significantly improve the control of your diabetes.  please let me know if you wish to be referred to a dietician.  high blood sugar is very risky to your health.  you should see an eye doctor and dentist every year.  It is very important to get all recommended vaccinations.  Controlling your blood pressure and cholesterol drastically reduces the damage diabetes does to your body.  Those who smoke should quit.  Please discuss these with your doctor.  check your blood sugar twice a day.  vary the time of day when you check, between before the 3 meals, and at bedtime.  also check if you have symptoms of your blood sugar being too high or too low.  please keep a record of the readings and bring it to your next appointment here (or you can bring the meter itself).  You can write it on any piece of paper.  please call us sooner if your blood sugar goes below 70, or if you have a lot of readings over 200. We will need to take this complex situation in stages.   Please change the Basaglar to 65 units each morning.  Please call or message [place] next week, to tell us how the blood sugar is doing.   If we continue to have the problem of the blood sugar being low in the morning and high later on, we'll change to NPH, which is a faster-acting insulin.   Please come back for a follow-up appointment in 1 month.

## 2020-03-04 NOTE — Progress Notes (Signed)
Subjective:    Patient ID: Andrew Pruitt, male    DOB: January 07, 1956, 64 y.o.   MRN: [id number]  HPI pt is referred by Raiford Noble, PA,  for diabetes.  Pt states DM was dx'ed in 4259; it is complicated by PN, PDR, and CRI; he has been on insulin since 2005; pt says his diet and exercise are fair; he has never had pancreatitis, pancreatic surgery, severe hypoglycemia or DKA.  He moved here from [place] 6 mos ago.  He says he was taking multiple DM meds, and would like to minimize the number of meds.  Since insulin was resumed 3 weeks ago, cbg's vary 118-400 (avg 275).  It is in general higher as the day goes on.  He takes Lantus at HS.  I reviewed continuous glucose monitor data.  Glucose varies from 140 to > 400.  It is in general higher as the day goes on Past Medical History:  Diagnosis Date   Arthritis    Depression    Diabetes mellitus without complication ([place])    Hyperlipidemia    Hypertension     Past Surgical History:  Procedure Laterality Date   TONSILLECTOMY      Social History   Socioeconomic History   Marital status: Married    Spouse name: Not on file   Number of children: Not on file   Years of education: Not on file   Highest education level: Not on file  Occupational History   Not on file  Tobacco Use   Smoking status: Never Smoker   Smokeless tobacco: Never Used  Vaping Use   Vaping Use: Never used  Substance and Sexual Activity   Alcohol use: Not Currently   Drug use: Never   Sexual activity: Yes  Other Topics Concern   Not on file  Social History Narrative   Not on file   Social Determinants of Health   Financial Resource Strain:    Difficulty of Paying Living Expenses:   Food Insecurity:    Worried About Charity fundraiser in the Last Year:    Arboriculturist in the Last Year:   Transportation Needs:    Film/video editor (Medical):    Lack of Transportation (Non-Medical):   Physical Activity:    Days of Exercise  per Week:    Minutes of Exercise per Session:   Stress:    Feeling of Stress :   Social Connections:    Frequency of Communication with Friends and Family:    Frequency of Social Gatherings with Friends and Family:    Attends Religious Services:    Active Member of Clubs or Organizations:    Attends Music therapist:    Marital Status:   Intimate Partner Violence:    Fear of Current or Ex-Partner:    Emotionally Abused:    Physically Abused:    Sexually Abused:     Current Outpatient Medications on File Prior to Visit  Medication Sig Dispense Refill   Continuous Blood Gluc Receiver (FREESTYLE LIBRE 14 DAY READER) DEVI Use to check glucose levels TID; E11.9 1 each 0   Continuous Blood Gluc Sensor (FREESTYLE LIBRE 14 DAY SENSOR) MISC Use to check glucose TID. Change every 14 days. E 11.9 6 each 3   losartan (COZAAR) 50 MG tablet Take 1 tablet (50 mg total) by mouth daily. 90 tablet 0   rosuvastatin (CRESTOR) 40 MG tablet Take 1 tablet (40 mg total) by mouth daily. 30 tablet 3  No current facility-administered medications on file prior to visit.    Allergies  Allergen Reactions   Niaspan [Niacin] Other (See Comments)    Red skin, red eyes    Family History  Problem Relation Age of Onset   Cancer Mother        Uterine    Early death Mother    Stroke Mother    Diabetes Father    Heart attack Father    Heart disease Father    Hyperlipidemia Father     BP 134/80    Pulse 73    Ht 5\' 10"  (1.778 m)    Wt 258 lb 12.8 oz (117.4 kg)    SpO2 96%    BMI 37.13 kg/m    Review of Systems denies chest pain, sob, n/v, memory loss.  He has mild blurry vision, chronic depression, and frequent urination.  He has lost a few lbs.       Objective:   Physical Exam VS: see vs page GEN: no distress HEAD: head: no deformity eyes: no periorbital swelling, no proptosis external nose and ears are normal NECK: supple, thyroid is not enlarged CHEST  WALL: no deformity LUNGS: clear to auscultation CV: reg rate and rhythm, no murmur MUSCULOSKELETAL: muscle bulk and strength are grossly normal.  no obvious joint swelling.  gait is normal and steady.   EXTEMITIES: no deformity.  no ulcer on the feet.  feet are of normal color and temp.  1+ bilat leg edema PULSES: dorsalis pedis intact bilat.  no carotid bruit NEURO:  cn 2-12 grossly intact.   readily moves all 4's.  sensation is intact to touch on the feet.   SKIN:  Normal texture and temperature.  No rash or suspicious lesion is visible.   NODES:  None palpable at the neck PSYCH: alert, well-oriented.  Does not appear anxious nor depressed.   Lab Results  Component Value Date   HGBA1C 14.7 (A) 02/11/2020   Lab Results  Component Value Date   CREATININE 1.26 02/11/2020   BUN 22 02/11/2020   NA 133 (L) 02/11/2020   K 4.6 02/11/2020   CL 96 02/11/2020   CO2 29 02/11/2020    I have reviewed outside records, and summarized: Pt was noted to have elevated A1c, and referred here.  He was also seen by opthal for DR.  Avastin injection was planned      Assessment & Plan:  Insulin-requiring type 2 DM, with CRI: he needs increased rx  Patient Instructions  good diet and exercise significantly improve the control of your diabetes.  please let me know if you wish to be referred to a dietician.  high blood sugar is very risky to your health.  you should see an eye doctor and dentist every year.  It is very important to get all recommended vaccinations.  Controlling your blood pressure and cholesterol drastically reduces the damage diabetes does to your body.  Those who smoke should quit.  Please discuss these with your doctor.  check your blood sugar twice a day.  vary the time of day when you check, between before the 3 meals, and at bedtime.  also check if you have symptoms of your blood sugar being too high or too low.  please keep a record of the readings and bring it to your next  appointment here (or you can bring the meter itself).  You can write it on any piece of paper.  please call us sooner if your blood  sugar goes below 70, or if you have a lot of readings over 200. We will need to take this complex situation in stages.   Please change the Basaglar to 65 units each morning.  Please call or message [place] next week, to tell us how the blood sugar is doing.   If we continue to have the problem of the blood sugar being low in the morning and high later on, we'll change to NPH, which is a faster-acting insulin.   Please come back for a follow-up appointment in 1 month.

## 2020-03-07 ENCOUNTER — Encounter: Payer: Self-pay | Admitting: Podiatry

## 2020-03-07 ENCOUNTER — Other Ambulatory Visit: Payer: Self-pay

## 2020-03-07 ENCOUNTER — Ambulatory Visit (INDEPENDENT_AMBULATORY_CARE_PROVIDER_SITE_OTHER): Payer: Medicare Other | Admitting: Podiatry

## 2020-03-07 VITALS — Temp 97.6°F

## 2020-03-07 DIAGNOSIS — E1149 Type 2 diabetes mellitus with other diabetic neurological complication: Secondary | ICD-10-CM | POA: Diagnosis not present

## 2020-03-07 DIAGNOSIS — Q828 Other specified congenital malformations of skin: Secondary | ICD-10-CM

## 2020-03-07 DIAGNOSIS — E1140 Type 2 diabetes mellitus with diabetic neuropathy, unspecified: Secondary | ICD-10-CM | POA: Diagnosis not present

## 2020-03-07 NOTE — Patient Instructions (Signed)
Diabetes Mellitus and Foot Care Foot care is an important part of your health, especially when you have diabetes. Diabetes may cause you to have problems because of poor blood flow (circulation) to your feet and legs, which can cause your skin to:  Become thinner and drier.  Break more easily.  Heal more slowly.  Peel and crack. You may also have nerve damage (neuropathy) in your legs and feet, causing decreased feeling in them. This means that you may not notice minor injuries to your feet that could lead to more serious problems. Noticing and addressing any potential problems early is the best way to prevent future foot problems. How to care for your feet Foot hygiene  Wash your feet daily with warm water and mild soap. Do not use hot water. Then, pat your feet and the areas between your toes until they are completely dry. Do not soak your feet as this can dry your skin.  Trim your toenails straight across. Do not dig under them or around the cuticle. File the edges of your nails with an emery board or nail file.  Apply a moisturizing lotion or petroleum jelly to the skin on your feet and to dry, brittle toenails. Use lotion that does not contain alcohol and is unscented. Do not apply lotion between your toes. Shoes and socks  Wear clean socks or stockings every day. Make sure they are not too tight. Do not wear knee-high stockings since they may decrease blood flow to your legs.  Wear shoes that fit properly and have enough cushioning. Always look in your shoes before you put them on to be sure there are no objects inside.  To break in new shoes, wear them for just a few hours a day. This prevents injuries on your feet. Wounds, scrapes, corns, and calluses  Check your feet daily for blisters, cuts, bruises, sores, and redness. If you cannot see the bottom of your feet, use a mirror or ask someone for help.  Do not cut corns or calluses or try to remove them with medicine.  If you  find a minor scrape, cut, or break in the skin on your feet, keep it and the skin around it clean and dry. You may clean these areas with mild soap and water. Do not clean the area with peroxide, alcohol, or iodine.  If you have a wound, scrape, corn, or callus on your foot, look at it several times a day to make sure it is healing and not infected. Check for: ? Redness, swelling, or pain. ? Fluid or blood. ? Warmth. ? Pus or a bad smell. General instructions  Do not cross your legs. This may decrease blood flow to your feet.  Do not use heating pads or hot water bottles on your feet. They may burn your skin. If you have lost feeling in your feet or legs, you may not know this is happening until it is too late.  Protect your feet from hot and cold by wearing shoes, such as at the beach or on hot pavement.  Schedule a complete foot exam at least once a year (annually) or more often if you have foot problems. If you have foot problems, report any cuts, sores, or bruises to your health care provider immediately. Contact a health care provider if:  You have a medical condition that increases your risk of infection and you have any cuts, sores, or bruises on your feet.  You have an injury that is not   healing.  You have redness on your legs or feet.  You feel burning or tingling in your legs or feet.  You have pain or cramps in your legs and feet.  Your legs or feet are numb.  Your feet always feel cold.  You have pain around a toenail. Get help right away if:  You have a wound, scrape, corn, or callus on your foot and: ? You have pain, swelling, or redness that gets worse. ? You have fluid or blood coming from the wound, scrape, corn, or callus. ? Your wound, scrape, corn, or callus feels warm to the touch. ? You have pus or a bad smell coming from the wound, scrape, corn, or callus. ? You have a fever. ? You have a red line going up your leg. Summary  Check your feet every day  for cuts, sores, red spots, swelling, and blisters.  Moisturize feet and legs daily.  Wear shoes that fit properly and have enough cushioning.  If you have foot problems, report any cuts, sores, or bruises to your health care provider immediately.  Schedule a complete foot exam at least once a year (annually) or more often if you have foot problems. This information is not intended to replace advice given to you by your health care provider. Make sure you discuss any questions you have with your health care provider. Document Revised: 05/20/2019 Document Reviewed: 09/28/2016 Elsevier Patient Education  2020 [employer] Inc[employer]  

## 2020-03-07 NOTE — Progress Notes (Signed)
BNM

## 2020-03-07 NOTE — Progress Notes (Signed)
   Subjective:    Patient ID: Andrew Pruitt, male    DOB: 1955/10/26, 64 y.o.   MRN: [id number]  HPI    Review of Systems  All other systems reviewed and are negative.      Objective:   Physical Exam        Assessment & Plan:

## 2020-03-09 ENCOUNTER — Encounter (INDEPENDENT_AMBULATORY_CARE_PROVIDER_SITE_OTHER): Payer: Self-pay | Admitting: Ophthalmology

## 2020-03-09 ENCOUNTER — Ambulatory Visit (INDEPENDENT_AMBULATORY_CARE_PROVIDER_SITE_OTHER): Payer: Medicare Other | Admitting: Ophthalmology

## 2020-03-09 ENCOUNTER — Other Ambulatory Visit: Payer: Self-pay

## 2020-03-09 DIAGNOSIS — E113592 Type 2 diabetes mellitus with proliferative diabetic retinopathy without macular edema, left eye: Secondary | ICD-10-CM

## 2020-03-09 MED ORDER — BEVACIZUMAB CHEMO INJECTION 1.25MG/0.05ML SYRINGE FOR KALEIDOSCOPE
1.2500 mg | INTRAVITREAL | Status: AC | PRN
  Administered 2020-03-09: 1.25 mg via INTRAVITREAL

## 2020-03-09 NOTE — Progress Notes (Signed)
Subjective:   Patient ID: Andrew Pruitt, male   DOB: 64 y.o.   MRN: [id number]   HPI Patient presents stating I need a basic diabetic foot exam and I know I have been in very poor control and also I have lesions on both my feet that can become bothersome and my wife tries to work on them but it is difficult to do.  Patient does not smoke is moderately obese and would like to be more active and has had diabetes for approximately 20 years   Review of Systems  All other systems reviewed and are negative.       Objective:  Physical Exam Vitals and nursing note reviewed.  Constitutional:      Appearance: He is well-developed.  Pulmonary:     Effort: Pulmonary effort is normal.  Musculoskeletal:        General: Normal range of motion.  Skin:    General: Skin is warm.  Neurological:     Mental Status: He is alert.     Vascular status indicates intact pulses that are slightly weak.  Patient does have diminishment of sharp dull vibratory distal bilateral and is noted to have numerous keratotic lesions plantar aspect of both feet measuring approximately 20 different lesions.  Patient has good range of motion of the subtalar midtarsal joint and mild diminishment muscle strength and was found that have good digital perfusion and is well oriented      Assessment:  Patient who is at high risk due to elevated A1c's who has multiple porokeratotic lesions bilateral along with diabetic neuropathy of a moderate nature     Plan:  H&P reviewed diabetic foot care and spent a great deal of time encouraging him to reduce his A1c and to take this more seriously.  Today using sharp sterile debridement I carefully debrided all lesions not going deep to taking the lesions out so that it can be done in a more professional standard.  This will be done periodically by physician in our group and I encouraged him to continue to do this.  I then discussed daily inspections if any changes were to occur or any  source of redness he is to a point immediately

## 2020-03-09 NOTE — Progress Notes (Signed)
03/09/2020     CHIEF COMPLAINT Patient presents for Retina Follow Up   HISTORY OF PRESENT ILLNESS: Andrew Pruitt is a 64 y.o. male who presents to the clinic today for:   HPI    Retina Follow Up    Patient presents with  Diabetic Retinopathy.  In left eye.  Duration of 1 week.  Since onset it is stable.          Comments    1 week follow up - Poss Avastin OS, no dilation Patient denies change in vision and overall has no complaints. LBS 150 /// A1C 14       Last edited by Gerda Diss on 03/09/2020  8:55 AM. (History)      Referring physician: Brunetta Jeans, PA-C [address] [place],  [place] [address]  HISTORICAL INFORMATION:   Selected notes from the MEDICAL RECORD NUMBER    Lab Results  Component Value Date   HGBA1C 14.7 (A) 02/11/2020     CURRENT MEDICATIONS: No current outpatient medications on file. (Ophthalmic Drugs)   No current facility-administered medications for this visit. (Ophthalmic Drugs)   Current Outpatient Medications (Other)  Medication Sig  . Continuous Blood Gluc Receiver (FREESTYLE LIBRE 14 DAY READER) DEVI Use to check glucose levels TID; E11.9  . Continuous Blood Gluc Sensor (FREESTYLE LIBRE 14 DAY SENSOR) MISC Use to check glucose TID. Change every 14 days. E 11.9  . Insulin Glargine (BASAGLAR KWIKPEN) 100 UNIT/ML Inject 0.65 mLs (65 Units total) into the skin every morning.  Marland Kitchen losartan (COZAAR) 50 MG tablet Take 1 tablet (50 mg total) by mouth daily.  . rosuvastatin (CRESTOR) 40 MG tablet Take 1 tablet (40 mg total) by mouth daily.   No current facility-administered medications for this visit. (Other)      REVIEW OF SYSTEMS:    ALLERGIES Allergies  Allergen Reactions  . Niaspan [Niacin] Other (See Comments)    Red skin, red eyes    PAST MEDICAL HISTORY Past Medical History:  Diagnosis Date  . Arthritis   . Depression   . Diabetes mellitus without complication ([place])   . Hyperlipidemia   . Hypertension     Past Surgical History:  Procedure Laterality Date  . TONSILLECTOMY      FAMILY HISTORY Family History  Problem Relation Age of Onset  . Cancer Mother        Uterine   . Early death Mother   . Stroke Mother   . Diabetes Father   . Heart attack Father   . Heart disease Father   . Hyperlipidemia Father     SOCIAL HISTORY Social History   Tobacco Use  . Smoking status: Never Smoker  . Smokeless tobacco: Never Used  Vaping Use  . Vaping Use: Never used  Substance Use Topics  . Alcohol use: Not Currently  . Drug use: Never         OPHTHALMIC EXAM:  Base Eye Exam    Visual Acuity (Snellen - Linear)      Right Left   Dist [place] 20/20-2 20/25-2       Tonometry (Tonopen, 8:56 AM)      Right Left   Pressure 22 21       Neuro/Psych    Oriented x3: Yes   Mood/Affect: Normal        Slit Lamp and Fundus Exam    External Exam      Right Left   External Normal Normal  Slit Lamp Exam      Right Left   Lids/Lashes Normal Normal   Conjunctiva/Sclera White and quiet White and quiet   Cornea Clear Clear   Anterior Chamber Deep and quiet Deep and quiet   Iris Round and reactive Round and reactive   Vitreous Normal Normal          IMAGING AND PROCEDURES  Imaging and Procedures for 03/09/20  Intravitreal Injection, Pharmacologic Agent - OS - Left Eye       Time Out 03/09/2020. 9:24 AM. Confirmed correct patient, procedure, site, and patient consented.   Anesthesia Topical anesthesia was used. Anesthetic medications included Akten 3.5%.   Procedure Preparation included Ofloxacin , 10% betadine to eyelids. A 30 gauge needle was used.   Injection:  1.25 mg Bevacizumab (AVASTIN) SOLN   NDC: [address]-4, Lot: [id number]   Route: Intravitreal, Site: Left Eye, Waste: 0 mg  Post-op Post injection exam found visual acuity of at least counting fingers. The patient tolerated the procedure well. There were no complications. The patient received written and  verbal post procedure care education. Post injection medications were not given.                 ASSESSMENT/PLAN:  No problem-specific Assessment & Plan notes found for this encounter.      ICD-10-CM   1. Proliferative diabetic retinopathy of left eye without macular edema associated with type 2 diabetes mellitus (HCC)  T01.6010 Intravitreal Injection, Pharmacologic Agent - OS - Left Eye    Bevacizumab (AVASTIN) SOLN 1.25 mg    1.  OS for intravitreal Avastin today to slow the progression of proliferative diabetic retinopathy and diffuse macular edema, will need PRP in the near future peripherally  2.  OD will schedule in 1 to 2 weeks for intravitreal Avastin to prevent progression of PDR  3.  Ophthalmic Meds Ordered this visit:  Meds ordered this encounter  Medications  . Bevacizumab (AVASTIN) SOLN 1.25 mg       Return in about 1 week (around 03/16/2020) for AVASTIN OCT, OD.  There are no Patient Instructions on file for this visit.   Explained the diagnoses, plan, and follow up with the patient and they expressed understanding.  Patient expressed understanding of the importance of proper follow up care.   Clent Demark Idamay Hosein M.D. Diseases & Surgery of the Retina and Vitreous Retina & Diabetic [place] 03/09/20     Abbreviations: M myopia (nearsighted); A astigmatism; H hyperopia (farsighted); P presbyopia; Mrx spectacle prescription;  CTL contact lenses; OD right eye; OS left eye; OU both eyes  XT exotropia; ET esotropia; PEK punctate epithelial keratitis; PEE punctate epithelial erosions; DES dry eye syndrome; MGD meibomian gland dysfunction; ATs artificial tears; PFAT's preservative free artificial tears; [place] nuclear sclerotic cataract; PSC posterior subcapsular cataract; ERM epi-retinal membrane; PVD posterior vitreous detachment; RD retinal detachment; DM diabetes mellitus; DR diabetic retinopathy; NPDR non-proliferative diabetic retinopathy; PDR proliferative  diabetic retinopathy; CSME clinically significant macular edema; DME diabetic macular edema; dbh dot blot hemorrhages; CWS cotton wool spot; POAG primary open angle glaucoma; C/D cup-to-disc ratio; HVF humphrey visual field; GVF goldmann visual field; OCT optical coherence tomography; IOP intraocular pressure; BRVO Branch retinal vein occlusion; CRVO central retinal vein occlusion; CRAO central retinal artery occlusion; BRAO branch retinal artery occlusion; RT retinal tear; SB scleral buckle; PPV pars plana vitrectomy; VH Vitreous hemorrhage; PRP panretinal laser photocoagulation; IVK intravitreal kenalog; VMT vitreomacular traction; MH Macular hole;  NVD neovascularization of the disc; NVE neovascularization  elsewhere; AREDS age related eye disease study; ARMD age related macular degeneration; POAG primary open angle glaucoma; EBMD epithelial/anterior basement membrane dystrophy; ACIOL anterior chamber intraocular lens; IOL intraocular lens; PCIOL posterior chamber intraocular lens; Phaco/IOL phacoemulsification with intraocular lens placement; PRK photorefractive keratectomy; LASIK laser assisted in situ keratomileusis; HTN hypertension; DM diabetes mellitus; COPD chronic obstructive pulmonary disease 

## 2020-03-16 ENCOUNTER — Ambulatory Visit (INDEPENDENT_AMBULATORY_CARE_PROVIDER_SITE_OTHER): Payer: Medicare Other | Admitting: Ophthalmology

## 2020-03-16 ENCOUNTER — Other Ambulatory Visit: Payer: Self-pay

## 2020-03-16 ENCOUNTER — Encounter (INDEPENDENT_AMBULATORY_CARE_PROVIDER_SITE_OTHER): Payer: Self-pay | Admitting: Ophthalmology

## 2020-03-16 DIAGNOSIS — E113591 Type 2 diabetes mellitus with proliferative diabetic retinopathy without macular edema, right eye: Secondary | ICD-10-CM | POA: Diagnosis not present

## 2020-03-16 MED ORDER — BEVACIZUMAB CHEMO INJECTION 1.25MG/0.05ML SYRINGE FOR KALEIDOSCOPE
1.2500 mg | INTRAVITREAL | Status: AC | PRN
  Administered 2020-03-16: 1.25 mg via INTRAVITREAL

## 2020-03-16 NOTE — Progress Notes (Signed)
03/16/2020     CHIEF COMPLAINT Patient presents for Retina Follow Up   HISTORY OF PRESENT ILLNESS: Andrew Pruitt is a 64 y.o. male who presents to the clinic today for:   HPI    Retina Follow Up    Patient presents with  Diabetic Retinopathy.  In right eye.  This started 1 week ago.  Severity is mild.  Duration of 1 week.  Since onset it is stable.          Comments    1 Week Avastin OD, no dilation  Pt denies noticeable changes to [place] OU since last visit. Pt denies ocular pain, flashes of light, or floaters OU.         Last edited by Rockie Neighbours, [place] on 03/16/2020  9:17 AM. (History)      Referring physician: Brunetta Jeans, PA-C [address] [place],  [place] [address]  HISTORICAL INFORMATION:   Selected notes from the MEDICAL RECORD NUMBER    Lab Results  Component Value Date   HGBA1C 14.7 (A) 02/11/2020     CURRENT MEDICATIONS: No current outpatient medications on file. (Ophthalmic Drugs)   No current facility-administered medications for this visit. (Ophthalmic Drugs)   Current Outpatient Medications (Other)  Medication Sig  . Continuous Blood Gluc Receiver (FREESTYLE LIBRE 14 DAY READER) DEVI Use to check glucose levels TID; E11.9  . Continuous Blood Gluc Sensor (FREESTYLE LIBRE 14 DAY SENSOR) MISC Use to check glucose TID. Change every 14 days. E 11.9  . Insulin Glargine (BASAGLAR KWIKPEN) 100 UNIT/ML Inject 0.65 mLs (65 Units total) into the skin every morning.  Marland Kitchen losartan (COZAAR) 50 MG tablet Take 1 tablet (50 mg total) by mouth daily.  . rosuvastatin (CRESTOR) 40 MG tablet Take 1 tablet (40 mg total) by mouth daily.   No current facility-administered medications for this visit. (Other)      REVIEW OF SYSTEMS:    ALLERGIES Allergies  Allergen Reactions  . Niaspan [Niacin] Other (See Comments)    Red skin, red eyes    PAST MEDICAL HISTORY Past Medical History:  Diagnosis Date  . Arthritis   . Depression   . Diabetes mellitus  without complication ([place])   . Hyperlipidemia   . Hypertension    Past Surgical History:  Procedure Laterality Date  . TONSILLECTOMY      FAMILY HISTORY Family History  Problem Relation Age of Onset  . Cancer Mother        Uterine   . Early death Mother   . Stroke Mother   . Diabetes Father   . Heart attack Father   . Heart disease Father   . Hyperlipidemia Father     SOCIAL HISTORY Social History   Tobacco Use  . Smoking status: Never Smoker  . Smokeless tobacco: Never Used  Vaping Use  . Vaping Use: Never used  Substance Use Topics  . Alcohol use: Not Currently  . Drug use: Never         OPHTHALMIC EXAM:  Base Eye Exam    Visual Acuity (ETDRS)      Right Left   Dist [place] 20/20 20/40 +2   Dist ph [place]  20/20       Tonometry (Tonopen, 9:21 AM)      Right Left   Pressure 15 13       Pupils      Pupils Dark Light Shape React APD   Right PERRL 5 4 Round Brisk None  Left PERRL 5 4 Round Brisk None       Visual Fields (Counting fingers)      Left Right    Full Full       Extraocular Movement      Right Left    Full Full       Neuro/Psych    Oriented x3: Yes   Mood/Affect: Normal       Dilation   No dilation today per GAR          IMAGING AND PROCEDURES  Imaging and Procedures for 03/16/20  OCT, Retina - OU - Both Eyes       Right Eye Quality was good. Scan locations included subfoveal. Central Foveal Thickness: 286. Progression has been stable.   Left Eye Quality was good. Scan locations included subfoveal. Central Foveal Thickness: 291. Progression has improved.   Notes CSME OD, focally in significant temporal to the fovea.   Injection intravitreal Avastin OD today       Intravitreal Injection, Pharmacologic Agent - OD - Right Eye       Time Out 03/16/2020. 9:49 AM. Confirmed correct patient, procedure, site, and patient consented.   Anesthesia Topical anesthesia was used. Anesthetic medications included Akten 3.5%.    Procedure Preparation included Tobramycin 0.3%, 10% betadine to eyelids. A 30 gauge needle was used.   Injection:  1.25 mg Bevacizumab (AVASTIN) SOLN   NDC: [address]-3, Lot: [id number]   Route: Intravitreal, Site: Right Eye, Waste: 0 mg  Post-op Post injection exam found visual acuity of at least counting fingers. The patient tolerated the procedure well. There were no complications. The patient received written and verbal post procedure care education. Post injection medications were not given.                 ASSESSMENT/PLAN:  No problem-specific Assessment & Plan notes found for this encounter.      ICD-10-CM   1. Proliferative diabetic retinopathy of right eye without macular edema associated with type 2 diabetes mellitus (HCC)  E11.3591 OCT, Retina - OU - Both Eyes    Intravitreal Injection, Pharmacologic Agent - OD - Right Eye    Bevacizumab (AVASTIN) SOLN 1.25 mg    1.  2.  3.  Ophthalmic Meds Ordered this visit:  Meds ordered this encounter  Medications  . Bevacizumab (AVASTIN) SOLN 1.25 mg       Return in about 2 weeks (around 03/30/2020) for OS, PRP.  There are no Patient Instructions on file for this visit.   Explained the diagnoses, plan, and follow up with the patient and they expressed understanding.  Patient expressed understanding of the importance of proper follow up care.   Clent Demark Shawne Bulow M.D. Diseases & Surgery of the Retina and Vitreous Retina & Diabetic [place] 03/16/20     Abbreviations: M myopia (nearsighted); A astigmatism; H hyperopia (farsighted); P presbyopia; Mrx spectacle prescription;  CTL contact lenses; OD right eye; OS left eye; OU both eyes  XT exotropia; ET esotropia; PEK punctate epithelial keratitis; PEE punctate epithelial erosions; DES dry eye syndrome; MGD meibomian gland dysfunction; ATs artificial tears; PFAT's preservative free artificial tears; [place] nuclear sclerotic cataract; PSC posterior subcapsular cataract;  ERM epi-retinal membrane; PVD posterior vitreous detachment; RD retinal detachment; DM diabetes mellitus; DR diabetic retinopathy; NPDR non-proliferative diabetic retinopathy; PDR proliferative diabetic retinopathy; CSME clinically significant macular edema; DME diabetic macular edema; dbh dot blot hemorrhages; CWS cotton wool spot; POAG primary open angle glaucoma; C/D cup-to-disc ratio; HVF humphrey visual  field; GVF goldmann visual field; OCT optical coherence tomography; IOP intraocular pressure; BRVO Branch retinal vein occlusion; CRVO central retinal vein occlusion; CRAO central retinal artery occlusion; BRAO branch retinal artery occlusion; RT retinal tear; SB scleral buckle; PPV pars plana vitrectomy; VH Vitreous hemorrhage; PRP panretinal laser photocoagulation; IVK intravitreal kenalog; VMT vitreomacular traction; MH Macular hole;  NVD neovascularization of the disc; NVE neovascularization elsewhere; AREDS age related eye disease study; ARMD age related macular degeneration; POAG primary open angle glaucoma; EBMD epithelial/anterior basement membrane dystrophy; ACIOL anterior chamber intraocular lens; IOL intraocular lens; PCIOL posterior chamber intraocular lens; Phaco/IOL phacoemulsification with intraocular lens placement; [place] photorefractive keratectomy; LASIK laser assisted in situ keratomileusis; HTN hypertension; DM diabetes mellitus; COPD chronic obstructive pulmonary disease

## 2020-03-23 ENCOUNTER — Ambulatory Visit (INDEPENDENT_AMBULATORY_CARE_PROVIDER_SITE_OTHER): Payer: Medicare Other | Admitting: Psychiatry

## 2020-03-23 ENCOUNTER — Other Ambulatory Visit: Payer: Self-pay

## 2020-03-23 DIAGNOSIS — F4323 Adjustment disorder with mixed anxiety and depressed mood: Secondary | ICD-10-CM

## 2020-03-23 NOTE — Progress Notes (Signed)
PROBLEM-FOCUSED INITIAL PSYCHOTHERAPY EVALUATION Andrew Moore, PhD LP [facility]  Name: Andrew Pruitt Date: 03/23/2020 Time spent: 50 min MRN: [id number] DOB: Sep 22, 1955 Guardian/Payee: self  PCP: Brunetta Jeans, PA-C Documentation requested on this visit: No  PROBLEM HISTORY Reason for Visit /Presenting Problem:  Chief Complaint  Patient presents with  . Establish Care  . Stress    Narrative/History of Present Illness Referred by self for treatment of stress and purported anger management.  PT reports wife has recommended him for help with anger -- may walk out in a huff when disputes happen.  No violence, threats, physical acting-out, or insulting language noted.  Greatly about being frustrated.  In [place] since December.  Original plan to relocate near daughter here, but stuck living with her and son-in-law after 54 years in [place] area).  Feels out of place here.  Remarried himself since 2000, wife had come to call his home her home.  Moved last summer in installments, got bullied out of the first job he held here at [employer] in [place].  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  Meanwhile, stepdaughter Loma Sousa, with whom they live, took over the finances, charging that PT and W blew it.  Issue with Anda Kraft having given $12K to [facility] for dubious reasons.  Family dynamics noted below, largely marked by control struggle with stepdaughter.    Wants to overcome depression, which he feels most for feeling confined to small space in their house, seeing sD and SIL spend large amounts of time smoking and drinking, and getting hooked into debates and disagreements in the house.  Wife in agreement with setting boundaries with their benefactor children, just not so clear how.  In  agreement about moving out, financially need to save $40-50K in order to go back out on their own.    Prior Psychiatric Assessment/Treatment:   Outpatient treatment: remote history Psychiatric hospitalization: none stated Psychological assessment/testing: none stated   Abuse/neglect screening: Victim of abuse: Not assessed at this time / none suspected.   Victim of neglect: Not assessed at this time / none suspected.   Perpetrator of abuse/neglect: Not assessed at this time / none suspected.   Witness / Exposure to Domestic Violence: Not assessed at this time / none suspected.   Witness to Community Violence:  Not assessed at this time / none suspected.   Protective Services Involvement: No.   Report needed: No.    Substance abuse screening: Current substance abuse: Not assessed at this time / none suspected.   History of impactful substance use/abuse: Not assessed at this time / none suspected.     FAMILY/SOCIAL HISTORY Family of origin -- eldest of 51 boys, father expected the most of PT, used to call him an SOB Family of intention/current living situation -- 5 grown children.  First wife 75 months, mother of eldest daughter, absconded with the daughter, costly.  Ex-W always told the daughter he was an "uncle", not her father.  Pt was only given 1 hr supervised visitation per 2 weeks.  Remarried, 2nd wife verbally abusive, called him stupid/dumb, took her back 3x, she milked him financially and ran off with a guy 51 yrs younger.  1 son with her, hx of his mother indulging frequent car-trading.  Son has found a track to live on  and become successful as a Buyer, retail.  Current wife Anda Kraft), her eldest D passed away 65 year of suspected opiate abuse.  Has seen Anda Kraft turn bitter in the aftermath.  Met her originally as the mother of several of his employees when he managed a [employer] and became something of a father figure to many.  Anda Kraft realized she was dissatisfied with her  20-year marriage to a fraudster, began their relationship as an affair.  Eventually came out to [facility], who was employed by PT at the time, allegedly well-received.  Living with Loma Sousa ("control freak") now.  She has been married 3x herself, hx of married to a hypocritical fundamentalist/porn addict, now stably to a man [place] from [place] who has hasty, ill-formed ideas about making money. 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 Spiritually -- deferred Enjoyable activities -- car collecting Other situational factors affecting treatment and prognosis: Stressors from the following areas: Financial difficulties, Marital or family conflict and Occupational concerns Barriers to service: none stated  Notable cultural sensitivities: none stated Strengths: Hopefulness, Self Advocate and Able to Communicate Effectively   MED/SURG HISTORY Med/surg history was not reviewed with PT at this time.  Of note for psychotherapy at this time stress-related illnesses and possible depressing medication effects. Past Medical History:  Diagnosis Date  . Arthritis   . Depression   . Diabetes mellitus without complication ([place])   . Hyperlipidemia   . Hypertension      Past Surgical History:  Procedure Laterality Date  . TONSILLECTOMY      Allergies  Allergen Reactions  . Niaspan [Niacin] Other (See Comments)    Red skin, red eyes    Medications (as listed in Epic): Current Outpatient Medications  Medication Sig Dispense Refill  . Continuous Blood Gluc Receiver (FREESTYLE LIBRE 14 DAY READER) DEVI Use to check glucose levels TID; E11.9 1 each 0  . Continuous Blood Gluc Sensor (FREESTYLE LIBRE 14 DAY SENSOR) MISC Use to check glucose TID. Change every 14 days. E 11.9 6 each 3  . Insulin NPH, Human,, Isophane, (HUMULIN N KWIKPEN) 100 UNIT/ML Kiwkpen Inject 65 Units into the  skin every morning. And pen needles 1/day 10 pen 11  . losartan (COZAAR) 50 MG tablet Take 1 tablet (50 mg total) by mouth daily. 90 tablet 0  . rosuvastatin (CRESTOR) 40 MG tablet Take 1 tablet (40 mg total) by mouth daily. 30 tablet 3  . Semaglutide,0.25 or 0.5MG/DOS, (OZEMPIC, 0.25 OR 0.5 MG/DOSE,) 2 MG/1.5ML SOPN Inject 0.1875 mLs (0.25 mg total) into the skin once a week. 1 pen 11   No current facility-administered medications for this visit.    MENTAL STATUS AND OBSERVATIONS Appearance:   Casual and Neat     Behavior:  Appropriate  Motor:  Normal  Speech/Language:   Clear and Coherent  Affect:  Appropriate  Mood:  worn  Thought process:  normal  Thought content:    WNL  Sensory/Perceptual disturbances:    WNL  Orientation:  Fully oriented  Attention:  Good  Concentration:  Good  Memory:  WNL  Fund of knowledge:   Good  Insight:    Good  Judgment:   Good  Impulse Control:  Good   Initial Risk Assessment: Danger to self: No Self-injurious behavior: No Danger to others: No Physical aggression / violence: No Duty to warn: No Access to firearms a concern: No Rosaria Ferries  involvement: No Patient / guardian was educated about steps to take if suicide or homicide risk level increases between visits: yes . While future psychiatric events cannot be accurately predicted, the patient does not currently require acute inpatient psychiatric care and does not currently meet [facility] involuntary commitment criteria.   DIAGNOSIS:    ICD-10-CM   1. Adjustment disorder with mixed anxiety and depressed mood  F43.23     INITIAL TREATMENT: . Support/validation provided for distressing symptoms and confirmed rapport . Ethical orientation and informed consent confirmed re: o privacy rights -- including but not limited to HIPAA, EMR and use of e-PHI o patient responsibilities -- scheduling, fair notice of changes, in-person vs. telehealth and regulatory and financial conditions affecting  choice o expectations for working relationship in psychotherapy o needs and consents for working partnerships and exchange of information with other health care providers, especially any medication and other behavioral health providers . Initial orientation to cognitive-behavioral and solution-focused therapy approach . Initial discussion and problem-solving o Acknowledged emotional difficulty of having been the head of household and having to submit to unwanted things for the sake of peace, understandable trigger for frustration, anger o Acknowledged difficulty with step relationship and wife being intermediary and having difficulty with conflict . Strong benefit to just being able to unpack his history and predicament in a supportive environment, validated PT's capacity to solve his living and relationship problems satisfactorily, largely about restoring autonomy and boundaries . Outlook for therapy -- scheduling constraints, availability of crisis service, inclusion of family member(s) as appropriate  Plan: . Further clarify with wife intentions, plans, steps needed to establish independent residence, whether it is in the vicinity or back to [place], as desired . Engage further for social problem-solving as desired . Option to relaxation training most likely to improve response to stress and ability to self-soothe on demand . Option formal or informal cognitive therapy to adjust and equip for more constructive interpretation of adverse interactions . Maintain medication as prescribed and work faithfully with relevant prescriber(s) if any changes are desired or seem indicated . Call the clinic on-call service, present to ER, or call 911 if any life-threatening psychiatric crisis Return in about 2 weeks (around 04/06/2020).  Blanchie Serve, PhD  Andrew Moore, PhD LP Clinical Psychologist, [facility] [address] [place], [place] [address] [phone]

## 2020-03-25 ENCOUNTER — Other Ambulatory Visit: Payer: Self-pay

## 2020-03-25 ENCOUNTER — Encounter: Payer: Self-pay | Admitting: Physician Assistant

## 2020-03-25 ENCOUNTER — Ambulatory Visit (INDEPENDENT_AMBULATORY_CARE_PROVIDER_SITE_OTHER): Payer: Medicare Other | Admitting: Physician Assistant

## 2020-03-25 VITALS — BP 130/82 | HR 75 | Temp 98.0°F | Resp 16 | Ht 70.0 in | Wt 259.0 lb

## 2020-03-25 DIAGNOSIS — I10 Essential (primary) hypertension: Secondary | ICD-10-CM

## 2020-03-25 DIAGNOSIS — E1159 Type 2 diabetes mellitus with other circulatory complications: Secondary | ICD-10-CM | POA: Diagnosis not present

## 2020-03-25 DIAGNOSIS — I152 Hypertension secondary to endocrine disorders: Secondary | ICD-10-CM

## 2020-03-25 DIAGNOSIS — Z23 Encounter for immunization: Secondary | ICD-10-CM

## 2020-03-25 DIAGNOSIS — Z1211 Encounter for screening for malignant neoplasm of colon: Secondary | ICD-10-CM

## 2020-03-25 NOTE — Patient Instructions (Signed)
Please keep taking BP medication as directed.  Follow low-salt diet below.  Call Dr. Loanne Drilling -- to discuss change in regimen to help with evening glucose levels. You really need to cut back on the snacking.   Follow-up with me in 3 months. Sooner if needed.  You will be contacted regarding your Cologuard kit. Please complete as soon as you can once it is received. We will call with results.   Hang in there!   DASH Eating Plan DASH stands for "Dietary Approaches to Stop Hypertension." The DASH eating plan is a healthy eating plan that has been shown to reduce high blood pressure (hypertension). It may also reduce your risk for type 2 diabetes, heart disease, and stroke. The DASH eating plan may also help with weight loss. What are tips for following this plan?  General guidelines  Avoid eating more than 2,300 mg (milligrams) of salt (sodium) a day. If you have hypertension, you may need to reduce your sodium intake to 1,500 mg a day.  Limit alcohol intake to no more than 1 drink a day for nonpregnant women and 2 drinks a day for men. One drink equals 12 oz of beer, 5 oz of wine, or 1 oz of hard liquor.  Work with your health care provider to maintain a healthy body weight or to lose weight. Ask what an ideal weight is for you.  Get at least 30 minutes of exercise that causes your heart to beat faster (aerobic exercise) most days of the week. Activities may include walking, swimming, or biking.  Work with your health care provider or diet and nutrition specialist (dietitian) to adjust your eating plan to your individual calorie needs. Reading food labels   Check food labels for the amount of sodium per serving. Choose foods with less than 5 percent of the Daily Value of sodium. Generally, foods with less than 300 mg of sodium per serving fit into this eating plan.  To find whole grains, look for the word "whole" as the first word in the ingredient list. Shopping  Buy products  labeled as "low-sodium" or "no salt added."  Buy fresh foods. Avoid canned foods and premade or frozen meals. Cooking  Avoid adding salt when cooking. Use salt-free seasonings or herbs instead of table salt or sea salt. Check with your health care provider or pharmacist before using salt substitutes.  Do not fry foods. Cook foods using healthy methods such as baking, boiling, grilling, and broiling instead.  Cook with heart-healthy oils, such as olive, canola, soybean, or sunflower oil. Meal planning  Eat a balanced diet that includes: ? 5 or more servings of fruits and vegetables each day. At each meal, try to fill half of your plate with fruits and vegetables. ? Up to 6-8 servings of whole grains each day. ? Less than 6 oz of lean meat, poultry, or fish each day. A 3-oz serving of meat is about the same size as a deck of cards. One egg equals 1 oz. ? 2 servings of low-fat dairy each day. ? A serving of nuts, seeds, or beans 5 times each week. ? Heart-healthy fats. Healthy fats called Omega-3 fatty acids are found in foods such as flaxseeds and coldwater fish, like sardines, salmon, and mackerel.  Limit how much you eat of the following: ? Canned or prepackaged foods. ? Food that is high in trans fat, such as fried foods. ? Food that is high in saturated fat, such as fatty meat. ? Sweets, desserts,  sugary drinks, and other foods with added sugar. ? Full-fat dairy products.  Do not salt foods before eating.  Try to eat at least 2 vegetarian meals each week.  Eat more home-cooked food and less restaurant, buffet, and fast food.  When eating at a restaurant, ask that your food be prepared with less salt or no salt, if possible. What foods are recommended? The items listed may not be a complete list. Talk with your dietitian about what dietary choices are best for you. Grains Whole-grain or whole-wheat bread. Whole-grain or whole-wheat pasta. Brown rice. [place]. Bulgur.  Whole-grain and low-sodium cereals. Pita bread. Low-fat, low-sodium crackers. Whole-wheat flour tortillas. Vegetables Fresh or frozen vegetables (raw, steamed, roasted, or grilled). Low-sodium or reduced-sodium tomato and vegetable juice. Low-sodium or reduced-sodium tomato sauce and tomato paste. Low-sodium or reduced-sodium canned vegetables. Fruits All fresh, dried, or frozen fruit. Canned fruit in natural juice (without added sugar). Meat and other protein foods Skinless chicken or [place]. Ground chicken or [place]. Pork with fat trimmed off. Fish and seafood. Egg whites. Dried beans, peas, or lentils. Unsalted nuts, nut butters, and seeds. Unsalted canned beans. Lean cuts of beef with fat trimmed off. Low-sodium, lean deli meat. Dairy Low-fat (1%) or fat-free (skim) milk. Fat-free, low-fat, or reduced-fat cheeses. Nonfat, low-sodium ricotta or cottage cheese. Low-fat or nonfat yogurt. Low-fat, low-sodium cheese. Fats and oils Soft margarine without trans fats. Vegetable oil. Low-fat, reduced-fat, or light mayonnaise and salad dressings (reduced-sodium). Canola, safflower, olive, soybean, and sunflower oils. Avocado. Seasoning and other foods Herbs. Spices. Seasoning mixes without salt. Unsalted popcorn and pretzels. Fat-free sweets. What foods are not recommended? The items listed may not be a complete list. Talk with your dietitian about what dietary choices are best for you. Grains Baked goods made with fat, such as croissants, muffins, or some breads. Dry pasta or rice meal packs. Vegetables Creamed or fried vegetables. Vegetables in a cheese sauce. Regular canned vegetables (not low-sodium or reduced-sodium). Regular canned tomato sauce and paste (not low-sodium or reduced-sodium). Regular tomato and vegetable juice (not low-sodium or reduced-sodium). Angie Fava. Olives. Fruits Canned fruit in a light or heavy syrup. Fried fruit. Fruit in cream or butter sauce. Meat and other protein  foods Fatty cuts of meat. Ribs. Fried meat. Berniece Salines. Sausage. Bologna and other processed lunch meats. Salami. Fatback. Hotdogs. Bratwurst. Salted nuts and seeds. Canned beans with added salt. Canned or smoked fish. Whole eggs or egg yolks. Chicken or [place] with skin. Dairy Whole or 2% milk, cream, and half-and-half. Whole or full-fat cream cheese. Whole-fat or sweetened yogurt. Full-fat cheese. Nondairy creamers. Whipped toppings. Processed cheese and cheese spreads. Fats and oils Butter. Stick margarine. Lard. Shortening. Ghee. Bacon fat. Tropical oils, such as coconut, palm kernel, or palm oil. Seasoning and other foods Salted popcorn and pretzels. Onion salt, garlic salt, seasoned salt, table salt, and sea salt. Worcestershire sauce. Tartar sauce. Barbecue sauce. Teriyaki sauce. Soy sauce, including reduced-sodium. Steak sauce. Canned and packaged gravies. Fish sauce. Oyster sauce. Cocktail sauce. Horseradish that you find on the shelf. Ketchup. Mustard. Meat flavorings and tenderizers. Bouillon cubes. Hot sauce and Tabasco sauce. Premade or packaged marinades. Premade or packaged taco seasonings. Relishes. Regular salad dressings. Where to find more information:  National Heart, Lung, and [place]: https://wilson-eaton.com/  American Heart Association: www.heart.org Summary  The DASH eating plan is a healthy eating plan that has been shown to reduce high blood pressure (hypertension). It may also reduce your risk for type 2 diabetes, heart disease,  and stroke.  With the DASH eating plan, you should limit salt (sodium) intake to 2,300 mg a day. If you have hypertension, you may need to reduce your sodium intake to 1,500 mg a day.  When on the DASH eating plan, aim to eat more fresh fruits and vegetables, whole grains, lean proteins, low-fat dairy, and heart-healthy fats.  Work with your health care provider or diet and nutrition specialist (dietitian) to adjust your eating plan to your  individual calorie needs. This information is not intended to replace advice given to you by your health care provider. Make sure you discuss any questions you have with your health care provider. Document Revised: 08/09/2017 Document Reviewed: 08/20/2016 Elsevier Patient Education  2020 [employer].

## 2020-03-25 NOTE — Progress Notes (Signed)
Patient presents to clinic today for follow-up of hypertension associated with DM II. Patient has been able to establish with Endocrinology since last visit, seeing Dr. Loanne Drilling. At last visit, losartan was increased to 50 mg daily due to BP above goal (140/78). Patient endorses taking medication as directed and tolerating well. Has not been checking BP at home. Patient denies chest pain, palpitations, lightheadedness, dizziness, vision changes or frequent headaches.  BP Readings from Last 3 Encounters:  03/04/20 134/80  02/25/20 140/78  02/11/20 138/80    Past Medical History:  Diagnosis Date  . Arthritis   . Depression   . Diabetes mellitus without complication ([place])   . Hyperlipidemia   . Hypertension     Current Outpatient Medications on File Prior to Visit  Medication Sig Dispense Refill  . Continuous Blood Gluc Receiver (FREESTYLE LIBRE 14 DAY READER) DEVI Use to check glucose levels TID; E11.9 1 each 0  . Continuous Blood Gluc Sensor (FREESTYLE LIBRE 14 DAY SENSOR) MISC Use to check glucose TID. Change every 14 days. E 11.9 6 each 3  . Insulin Glargine (BASAGLAR KWIKPEN) 100 UNIT/ML Inject 0.65 mLs (65 Units total) into the skin every morning. 15 mL 2  . losartan (COZAAR) 50 MG tablet Take 1 tablet (50 mg total) by mouth daily. 90 tablet 0  . rosuvastatin (CRESTOR) 40 MG tablet Take 1 tablet (40 mg total) by mouth daily. 30 tablet 3   No current facility-administered medications on file prior to visit.    Allergies  Allergen Reactions  . Niaspan [Niacin] Other (See Comments)    Red skin, red eyes    Family History  Problem Relation Age of Onset  . Cancer Mother        Uterine   . Early death Mother   . Stroke Mother   . Diabetes Father   . Heart attack Father   . Heart disease Father   . Hyperlipidemia Father     Social History   Socioeconomic History  . Marital status: Married    Spouse name: Not on file  . Number of children: Not on file  . Years of  education: Not on file  . Highest education level: Not on file  Occupational History  . Not on file  Tobacco Use  . Smoking status: Never Smoker  . Smokeless tobacco: Never Used  Vaping Use  . Vaping Use: Never used  Substance and Sexual Activity  . Alcohol use: Not Currently  . Drug use: Never  . Sexual activity: Yes  Other Topics Concern  . Not on file  Social History Narrative  . Not on file   Social Determinants of Health   Financial Resource Strain:   . Difficulty of Paying Living Expenses:   Food Insecurity:   . Worried About Charity fundraiser in the Last Year:   . Arboriculturist in the Last Year:   Transportation Needs:   . Film/video editor (Medical):   Marland Kitchen Lack of Transportation (Non-Medical):   Physical Activity:   . Days of Exercise per Week:   . Minutes of Exercise per Session:   Stress:   . Feeling of Stress :   Social Connections:   . Frequency of Communication with Friends and Family:   . Frequency of Social Gatherings with Friends and Family:   . Attends Religious Services:   . Active Member of Clubs or Organizations:   . Attends Archivist Meetings:   Marland Kitchen Marital Status:  Review of Systems - See HPI.  All other ROS are negative.  Wt 259 lb (117.5 kg)   BMI 37.16 kg/m   Physical Exam Vitals reviewed.  Constitutional:      Appearance: Normal appearance.  HENT:     Head: Normocephalic and atraumatic.  Eyes:     Conjunctiva/sclera: Conjunctivae normal.  Cardiovascular:     Rate and Rhythm: Normal rate and regular rhythm.     Pulses: Normal pulses.     Heart sounds: Normal heart sounds.  Pulmonary:     Effort: Pulmonary effort is normal.     Breath sounds: Normal breath sounds.  Musculoskeletal:     Cervical back: Neck supple.  Neurological:     General: No focal deficit present.     Mental Status: He is alert.  Psychiatric:        Mood and Affect: Mood normal.     Recent Results (from the past 2160 hour(s))  POCT  HgB A1C     Status: Abnormal   Collection Time: 02/11/20  9:48 AM  Result Value Ref Range   Hemoglobin A1C 14.7 (A) 4.0 - 5.6 %   HbA1c POC (<> result, manual entry)     HbA1c, POC (prediabetic range)     HbA1c, POC (controlled diabetic range)    CBC with Differential/Platelet     Status: Abnormal   Collection Time: 02/11/20 10:37 AM  Result Value Ref Range   WBC 4.7 4.0 - 10.5 K/uL   RBC 5.47 4.22 - 5.81 Mil/uL   Hemoglobin 17.4 (H) 13.0 - 17.0 g/dL   HCT 49.5 39 - 52 %   MCV 90.5 78.0 - 100.0 fl   MCHC 35.1 30.0 - 36.0 g/dL   RDW 13.3 11.5 - 15.5 %   Platelets 172.0 Repeated and verified X2. 150 - 400 K/uL   Neutrophils Relative % 52.9 43 - 77 %   Lymphocytes Relative 37.0 12 - 46 %   Monocytes Relative 7.3 3 - 12 %   Eosinophils Relative 1.8 0 - 5 %   Basophils Relative 1.0 0 - 3 %   Neutro Abs 2.5 1.4 - 7.7 K/uL   Lymphs Abs 1.7 0.7 - 4.0 K/uL   Monocytes Absolute 0.3 0 - 1 K/uL   Eosinophils Absolute 0.1 0 - 0 K/uL   Basophils Absolute 0.0 0 - 0 K/uL  Comprehensive metabolic panel     Status: Abnormal   Collection Time: 02/11/20 10:37 AM  Result Value Ref Range   Sodium 133 (L) 135 - 145 mEq/L   Potassium 4.6 3.5 - 5.1 mEq/L   Chloride 96 96 - 112 mEq/L   CO2 29 19 - 32 mEq/L   Glucose, Bld 438 (H) 70 - 99 mg/dL   BUN 22 6 - 23 mg/dL   Creatinine, Ser 1.26 0.40 - 1.50 mg/dL   Total Bilirubin 0.7 0.2 - 1.2 mg/dL   Alkaline Phosphatase 80 39 - 117 U/L   AST 15 0 - 37 U/L   ALT 17 0 - 53 U/L   Total Protein 6.8 6.0 - 8.3 g/dL   Albumin 4.4 3.5 - 5.2 g/dL   GFR 57.44 (L) >60.00 mL/min   Calcium 9.4 8.4 - 10.5 mg/dL  Lipid panel     Status: Abnormal   Collection Time: 02/11/20 10:37 AM  Result Value Ref Range   Cholesterol 374 (H) 0 - 200 mg/dL    Comment: ATP III Classification       Desirable:  <  200 mg/dL               Borderline High:  200 - 239 mg/dL          High:  > = 240 mg/dL   Triglycerides (H) 0 - 149 mg/dL    1665.0 Triglyceride is over 400;  calculations on Lipids are invalid.    Comment: Normal:  <150 mg/dLBorderline High:  150 - 199 mg/dL   HDL 30.00 (L) >39.00 mg/dL   Total CHOL/HDL Ratio 12     Comment:                Men          Women1/2 Average Risk     3.4          3.3Average Risk          5.0          4.42X Average Risk          9.6          7.13X Average Risk          15.0          11.0                      TSH     Status: None   Collection Time: 02/11/20 10:37 AM  Result Value Ref Range   TSH 2.95 0.35 - 4.50 uIU/mL  PSA     Status: None   Collection Time: 02/11/20 10:37 AM  Result Value Ref Range   PSA 0.68 0.10 - 4.00 ng/mL    Comment: Test performed using Access Hybritech PSA Assay, a parmagnetic partical, chemiluminecent immunoassay.  Urine Microalbumin w/creat. ratio     Status: Abnormal   Collection Time: 02/11/20 10:37 AM  Result Value Ref Range   Microalb, Ur 5.7 (H) 0.0 - 1.9 mg/dL   Creatinine,U 51.7 mg/dL   Microalb Creat Ratio 11.0 0.0 - 30.0 mg/g  LDL cholesterol, direct     Status: None   Collection Time: 02/11/20 10:37 AM  Result Value Ref Range   Direct LDL 81.0 mg/dL    Comment: Optimal:  <100 mg/dLNear or Above Optimal:  100-129 mg/dLBorderline High:  130-159 mg/dLHigh:  160-189 mg/dLVery High:  >190 mg/dL  POCT CBG (Fasting - Glucose)     Status: Abnormal   Collection Time: 02/11/20 10:41 AM  Result Value Ref Range   Glucose Fasting, POC 400 (A) 70 - 99 mg/dL  POCT Urinalysis Dipstick     Status: Abnormal   Collection Time: 02/11/20 10:58 AM  Result Value Ref Range   Color, UA lighg yellow    Clarity, UA clear    Glucose, UA Positive (A) Negative    Comment: 3+   Bilirubin, UA Negative    Ketones, UA Positive, 5    Spec Grav, UA 1.015 1.010 - 1.025   Blood, UA Negative    pH, UA 5.5 5.0 - 8.0   Protein, UA Negative Negative   Urobilinogen, UA 0.2 0.2 or 1.0 E.U./dL   Nitrite, UA Negative    Leukocytes, UA Negative Negative   Appearance     Odor     Assessment/Plan: 1.  Hypertension associated with diabetes ([place]) BP improved today. Continue current regimen. Follow-up with me in 3 months.   2. Need for Streptococcus pneumoniae vaccination Prevnar given today. Pneumovax in 1 year. Patient to get Shingrix from the pharmacy due to cost with Medicare.  3. Colon  cancer screening Average risk. No prior screening. Agrees  - Cologuard  This visit occurred during the SARS-CoV-2 public health emergency.  Safety protocols were in place, including screening questions prior to the visit, additional usage of staff PPE, and extensive cleaning of exam room while observing appropriate contact time as indicated for disinfecting solutions.     Leeanne Rio, PA-C

## 2020-03-30 ENCOUNTER — Other Ambulatory Visit: Payer: Self-pay

## 2020-03-30 ENCOUNTER — Ambulatory Visit (INDEPENDENT_AMBULATORY_CARE_PROVIDER_SITE_OTHER): Payer: Medicare Other | Admitting: Ophthalmology

## 2020-03-30 ENCOUNTER — Encounter (INDEPENDENT_AMBULATORY_CARE_PROVIDER_SITE_OTHER): Payer: Self-pay | Admitting: Ophthalmology

## 2020-03-30 DIAGNOSIS — E113592 Type 2 diabetes mellitus with proliferative diabetic retinopathy without macular edema, left eye: Secondary | ICD-10-CM

## 2020-03-30 NOTE — Progress Notes (Signed)
03/30/2020     CHIEF COMPLAINT Patient presents for Retina Follow Up   HISTORY OF PRESENT ILLNESS: Andrew Pruitt is a 64 y.o. male who presents to the clinic today for:   HPI    Retina Follow Up    Patient presents with  Diabetic Retinopathy.  In left eye.  This started 2 weeks ago.  Duration of 2 weeks.  Since onset it is stable.          Comments    2 week f/u dilated exam and possible PRP OS.  Pt denies noticeable changes to [place] OU since last visit. Pt denies ocular pain, flashes of light, or floaters OU.  LBS was 197 this a.m        Last edited by Melburn Popper, COA on 03/30/2020  9:04 AM. (History)      Referring physician: Brunetta Jeans, PA-C [id number] [place],  [place] [id number]  HISTORICAL INFORMATION:   Selected notes from the MEDICAL RECORD NUMBER    Lab Results  Component Value Date   HGBA1C 14.7 (A) 02/11/2020     CURRENT MEDICATIONS: No current outpatient medications on file. (Ophthalmic Drugs)   No current facility-administered medications for this visit. (Ophthalmic Drugs)   Current Outpatient Medications (Other)  Medication Sig  . Continuous Blood Gluc Receiver (FREESTYLE LIBRE 14 DAY READER) DEVI Use to check glucose levels TID; E11.9  . Continuous Blood Gluc Sensor (FREESTYLE LIBRE 14 DAY SENSOR) MISC Use to check glucose TID. Change every 14 days. E 11.9  . Insulin Glargine (BASAGLAR KWIKPEN) 100 UNIT/ML Inject 0.65 mLs (65 Units total) into the skin every morning.  Marland Kitchen losartan (COZAAR) 50 MG tablet Take 1 tablet (50 mg total) by mouth daily.  . rosuvastatin (CRESTOR) 40 MG tablet Take 1 tablet (40 mg total) by mouth daily.   No current facility-administered medications for this visit. (Other)      REVIEW OF SYSTEMS:    ALLERGIES Allergies  Allergen Reactions  . Niaspan [Niacin] Other (See Comments)    Red skin, red eyes    PAST MEDICAL HISTORY Past Medical History:  Diagnosis Date  . Arthritis   . Depression     . Diabetes mellitus without complication ([place])   . Hyperlipidemia   . Hypertension    Past Surgical History:  Procedure Laterality Date  . TONSILLECTOMY      FAMILY HISTORY Family History  Problem Relation Age of Onset  . Cancer Mother        Uterine   . Early death Mother   . Stroke Mother   . Diabetes Father   . Heart attack Father   . Heart disease Father   . Hyperlipidemia Father     SOCIAL HISTORY Social History   Tobacco Use  . Smoking status: Never Smoker  . Smokeless tobacco: Never Used  Vaping Use  . Vaping Use: Never used  Substance Use Topics  . Alcohol use: Not Currently  . Drug use: Never         OPHTHALMIC EXAM:  Base Eye Exam    Visual Acuity (ETDRS)      Right Left   Dist [place] 20/20 -1 20/40   Dist ph [place]  20/30       Tonometry (Tonopen, 9:07 AM)      Right Left   Pressure 18 17       Pupils      Pupils Dark Light Shape React APD   Right  PERRL 4 3 Round Slow None   Left PERRL 4 3 Round Slow None       Visual Fields (Counting fingers)      Left Right    Full Full       Extraocular Movement      Right Left    Full Full       Neuro/Psych    Oriented x3: Yes   Mood/Affect: Normal       Dilation    Left eye: 1.0% Mydriacyl, 2.5% Phenylephrine @ 9:07 AM        Slit Lamp and Fundus Exam    External Exam      Right Left   External Normal Normal       Slit Lamp Exam      Right Left   Lids/Lashes Normal Normal   Conjunctiva/Sclera White and quiet White and quiet   Cornea Clear Clear   Anterior Chamber Deep and quiet Deep and quiet   Iris Round and reactive Round and reactive   Lens Normal Normal   Vitreous Normal Normal          IMAGING AND PROCEDURES  Imaging and Procedures for 03/30/20  Panretinal Photocoagulation - OS - Left Eye       Time Out Confirmed correct patient, procedure, site, and patient consented.   Anesthesia Topical anesthesia was used. Anesthetic medications included Proparacaine 0.5%.    Laser Information The type of laser was diode. Color was yellow. The duration in seconds was 0.02. The spot size was 390 microns. Laser power was 280. Total spots was 1437.   Post-op The patient tolerated the procedure well. There were no complications. The patient received written and verbal post procedure care education.   Notes PRP applied anterior and at the equator, nearly 362 regions of capillary nonperfusion.                ASSESSMENT/PLAN:  Proliferative diabetic retinopathy of left eye (HCC) PRP applied anterior and at the equator 360 OS to regions of capillary nonperfusion      ICD-10-CM   1. Proliferative diabetic retinopathy of left eye without macular edema associated with type 2 diabetes mellitus (HCC)  Z30.8657 Panretinal Photocoagulation - OS - Left Eye    1.  PRP applied OS today  2.  3.  Ophthalmic Meds Ordered this visit:  No orders of the defined types were placed in this encounter.      Return in about 2 weeks (around 04/13/2020) for OD, dilate, PRP,  2 quadrants.  There are no Patient Instructions on file for this visit.   Explained the diagnoses, plan, and follow up with the patient and they expressed understanding.  Patient expressed understanding of the importance of proper follow up care.   Clent Demark Seiji Wiswell M.D. Diseases & Surgery of the Retina and Vitreous [facility] [place] 03/30/20     Abbreviations: M myopia (nearsighted); A astigmatism; H hyperopia (farsighted); P presbyopia; Mrx spectacle prescription;  CTL contact lenses; OD right eye; OS left eye; OU both eyes  XT exotropia; ET esotropia; PEK punctate epithelial keratitis; PEE punctate epithelial erosions; DES dry eye syndrome; MGD meibomian gland dysfunction; ATs artificial tears; PFAT's preservative free artificial tears; [place] nuclear sclerotic cataract; PSC posterior subcapsular cataract; ERM epi-retinal membrane; PVD posterior vitreous detachment; RD retinal  detachment; DM diabetes mellitus; DR diabetic retinopathy; NPDR non-proliferative diabetic retinopathy; PDR proliferative diabetic retinopathy; CSME clinically significant macular edema; DME diabetic macular edema; dbh dot blot hemorrhages;  CWS cotton wool spot; POAG primary open angle glaucoma; C/D cup-to-disc ratio; HVF humphrey visual field; GVF goldmann visual field; OCT optical coherence tomography; IOP intraocular pressure; BRVO Branch retinal vein occlusion; CRVO central retinal vein occlusion; CRAO central retinal artery occlusion; BRAO branch retinal artery occlusion; RT retinal tear; SB scleral buckle; PPV pars plana vitrectomy; VH Vitreous hemorrhage; PRP panretinal laser photocoagulation; IVK intravitreal kenalog; VMT vitreomacular traction; MH Macular hole;  NVD neovascularization of the disc; NVE neovascularization elsewhere; AREDS age related eye disease study; ARMD age related macular degeneration; POAG primary open angle glaucoma; EBMD epithelial/anterior basement membrane dystrophy; ACIOL anterior chamber intraocular lens; IOL intraocular lens; PCIOL posterior chamber intraocular lens; Phaco/IOL phacoemulsification with intraocular lens placement; [place] photorefractive keratectomy; LASIK laser assisted in situ keratomileusis; HTN hypertension; DM diabetes mellitus; COPD chronic obstructive pulmonary disease

## 2020-03-30 NOTE — Assessment & Plan Note (Signed)
PRP applied anterior and at the equator 360 OS to regions of capillary nonperfusion

## 2020-04-08 ENCOUNTER — Other Ambulatory Visit: Payer: Self-pay

## 2020-04-08 ENCOUNTER — Telehealth: Payer: Self-pay | Admitting: Endocrinology

## 2020-04-08 ENCOUNTER — Encounter: Payer: Self-pay | Admitting: Endocrinology

## 2020-04-08 ENCOUNTER — Ambulatory Visit (INDEPENDENT_AMBULATORY_CARE_PROVIDER_SITE_OTHER): Payer: Medicare Other | Admitting: Endocrinology

## 2020-04-08 VITALS — BP 140/72 | HR 85 | Ht 70.0 in | Wt 264.4 lb

## 2020-04-08 DIAGNOSIS — Z794 Long term (current) use of insulin: Secondary | ICD-10-CM | POA: Diagnosis not present

## 2020-04-08 DIAGNOSIS — E1140 Type 2 diabetes mellitus with diabetic neuropathy, unspecified: Secondary | ICD-10-CM | POA: Diagnosis not present

## 2020-04-08 LAB — POCT GLYCOSYLATED HEMOGLOBIN (HGB A1C): Hemoglobin A1C: 12.6 % — AB (ref 4.0–5.6)

## 2020-04-08 MED ORDER — TRULICITY 0.75 MG/0.5ML ~~LOC~~ SOAJ
0.7500 mg | SUBCUTANEOUS | 3 refills | Status: DC
Start: 2020-04-08 — End: 2020-04-08

## 2020-04-08 MED ORDER — OZEMPIC (0.25 OR 0.5 MG/DOSE) 2 MG/1.5ML ~~LOC~~ SOPN: 0.2500 mg | PEN_INJECTOR | SUBCUTANEOUS | 11 refills | Status: DC

## 2020-04-08 MED ORDER — HUMULIN N KWIKPEN 100 UNIT/ML ~~LOC~~ SUPN: 65.0000 [IU] | PEN_INJECTOR | SUBCUTANEOUS | 11 refills | Status: DC

## 2020-04-08 MED ORDER — HUMULIN N KWIKPEN 100 UNIT/ML ~~LOC~~ SUPN: 50.0000 [IU] | PEN_INJECTOR | SUBCUTANEOUS | 11 refills | Status: DC

## 2020-04-08 NOTE — Patient Instructions (Addendum)
check your blood sugar twice a day.  vary the time of day when you check, between before the 3 meals, and at bedtime.  also check if you have symptoms of your blood sugar being too high or too low.  please keep a record of the readings and bring it to your next appointment here (or you can bring the meter itself).  You can write it on any piece of paper.  please call us sooner if your blood sugar goes below 70, or if you have a lot of readings over 200. We will need to take this complex situation in stages.   Please change the Basaglar to NPH, 65 units each morning.    I have also sent a prescription to your pharmacy, to start Trulicity Please come back for a follow-up appointment in 1 month.

## 2020-04-08 NOTE — Telephone Encounter (Signed)
Please review and advise.

## 2020-04-08 NOTE — Progress Notes (Signed)
Subjective:    Patient ID: Andrew Pruitt, male    DOB: Feb 05, 1956, 64 y.o.   MRN: [id number]  HPI Pt returns for f/u of diabetes mellitus: DM type: Insulin-requiring type 2 Dx'ed: 3007 Complications: PN, PDR, and CRI Therapy: insulin since 2005 DKA: never Severe hypoglycemia: never Pancreatitis: never Pancreatic imaging: never SDOH: none Other: she declines multiple daily injections Interval history: pt states he feels well in general.  I have reviewed continuous glucose monitor data.  Glucose varies from 130 up to >400.  It is in general higher as the day goes on.    Past Medical History:  Diagnosis Date  . Arthritis   . Depression   . Diabetes mellitus without complication ([place])   . Hyperlipidemia   . Hypertension     Past Surgical History:  Procedure Laterality Date  . TONSILLECTOMY      Social History   Socioeconomic History  . Marital status: Married    Spouse name: Not on file  . Number of children: Not on file  . Years of education: Not on file  . Highest education level: Not on file  Occupational History  . Not on file  Tobacco Use  . Smoking status: Never Smoker  . Smokeless tobacco: Never Used  Vaping Use  . Vaping Use: Never used  Substance and Sexual Activity  . Alcohol use: Not Currently  . Drug use: Never  . Sexual activity: Yes  Other Topics Concern  . Not on file  Social History Narrative  . Not on file   Social Determinants of Health   Financial Resource Strain:   . Difficulty of Paying Living Expenses:   Food Insecurity:   . Worried About Charity fundraiser in the Last Year:   . Arboriculturist in the Last Year:   Transportation Needs:   . Film/video editor (Medical):   Marland Kitchen Lack of Transportation (Non-Medical):   Physical Activity:   . Days of Exercise per Week:   . Minutes of Exercise per Session:   Stress:   . Feeling of Stress :   Social Connections:   . Frequency of Communication with Friends and Family:   . Frequency  of Social Gatherings with Friends and Family:   . Attends Religious Services:   . Active Member of Clubs or Organizations:   . Attends Archivist Meetings:   Marland Kitchen Marital Status:   Intimate Partner Violence:   . Fear of Current or Ex-Partner:   . Emotionally Abused:   Marland Kitchen Physically Abused:   . Sexually Abused:     Current Outpatient Medications on File Prior to Visit  Medication Sig Dispense Refill  . Continuous Blood Gluc Receiver (FREESTYLE LIBRE 14 DAY READER) DEVI Use to check glucose levels TID; E11.9 1 each 0  . Continuous Blood Gluc Sensor (FREESTYLE LIBRE 14 DAY SENSOR) MISC Use to check glucose TID. Change every 14 days. E 11.9 6 each 3  . losartan (COZAAR) 50 MG tablet Take 1 tablet (50 mg total) by mouth daily. 90 tablet 0  . rosuvastatin (CRESTOR) 40 MG tablet Take 1 tablet (40 mg total) by mouth daily. 30 tablet 3   No current facility-administered medications on file prior to visit.    Allergies  Allergen Reactions  . Niaspan [Niacin] Other (See Comments)    Red skin, red eyes    Family History  Problem Relation Age of Onset  . Cancer Mother        Uterine   .  Early death Mother   . Stroke Mother   . Diabetes Father   . Heart attack Father   . Heart disease Father   . Hyperlipidemia Father     BP (!) 140/72 (BP Location: Left Arm, Patient Position: Sitting, Cuff Size: Normal)   Pulse 85   Ht 5\' 10"  (1.778 m)   Wt (!) 264 lb 6.4 oz (119.9 kg)   SpO2 98%   BMI 37.94 kg/m   Review of Systems He denies hypoglycemia.      Objective:   Physical Exam VITAL SIGNS:  See vs page GENERAL: no distress Pulses: dorsalis pedis intact bilat.   MSK: no deformity of the feet CV: no leg edema Skin:  no ulcer on the feet.  normal color and temp on the feet. Neuro: sensation is intact to touch on the feet  Lab Results  Component Value Date   HGBA1C 12.6 (A) 04/08/2020       Assessment & Plan:  Insulin-requiring type 2 DM.  The pattern of his cbg's  indicates he needs a faster-acting qd insulin. He declines multiple daily injections.    Patient Instructions  check your blood sugar twice a day.  vary the time of day when you check, between before the 3 meals, and at bedtime.  also check if you have symptoms of your blood sugar being too high or too low.  please keep a record of the readings and bring it to your next appointment here (or you can bring the meter itself).  You can write it on any piece of paper.  please call us sooner if your blood sugar goes below 70, or if you have a lot of readings over 200. We will need to take this complex situation in stages.   Please change the Basaglar to NPH, 65 units each morning.    I have also sent a prescription to your pharmacy, to start Trulicity Please come back for a follow-up appointment in 1 month.

## 2020-04-08 NOTE — Telephone Encounter (Signed)
Ok, I have sent a prescription to your pharmacy, to change to [place].  There is a discount card that makes the copay less.

## 2020-04-08 NOTE — Telephone Encounter (Signed)
Patient called to advise that Trulicity is too expensive and that Dr Loanne Drilling said "to let him know if this was a cost prohibitive medication and that he would do something different."  Call back number [phone]

## 2020-04-08 NOTE — Telephone Encounter (Signed)
Called pt and informed him about Dr. Cordelia Pen response below. Verbalized acceptance and understanding.

## 2020-04-11 ENCOUNTER — Encounter (INDEPENDENT_AMBULATORY_CARE_PROVIDER_SITE_OTHER): Payer: Self-pay | Admitting: Ophthalmology

## 2020-04-11 ENCOUNTER — Ambulatory Visit (INDEPENDENT_AMBULATORY_CARE_PROVIDER_SITE_OTHER): Payer: Medicare Other | Admitting: Ophthalmology

## 2020-04-11 ENCOUNTER — Other Ambulatory Visit: Payer: Self-pay

## 2020-04-11 DIAGNOSIS — E113591 Type 2 diabetes mellitus with proliferative diabetic retinopathy without macular edema, right eye: Secondary | ICD-10-CM | POA: Diagnosis not present

## 2020-04-11 NOTE — Progress Notes (Signed)
04/11/2020     CHIEF COMPLAINT Patient presents for Retina Follow Up   HISTORY OF PRESENT ILLNESS: Andrew Pruitt is a 64 y.o. male who presents to the clinic today for:   HPI    Retina Follow Up    Patient presents with  Diabetic Retinopathy.  In right eye.  This started 2 weeks ago.  Severity is mild.  Duration of 2 weeks.  Since onset it is stable.          Comments    2 Week PRP OD  Pt denies noticeable changes to [place] OU since last visit. Pt denies ocular pain, flashes of light, or floaters OU.  LBS: 110 this AM       Last edited by Rockie Neighbours, [place] on 04/11/2020 10:38 AM. (History)      Referring physician: Brunetta Jeans, PA-C [address] [place],  [place] [address]  HISTORICAL INFORMATION:   Selected notes from the MEDICAL RECORD NUMBER    Lab Results  Component Value Date   HGBA1C 12.6 (A) 04/08/2020     CURRENT MEDICATIONS: No current outpatient medications on file. (Ophthalmic Drugs)   No current facility-administered medications for this visit. (Ophthalmic Drugs)   Current Outpatient Medications (Other)  Medication Sig  . Continuous Blood Gluc Receiver (FREESTYLE LIBRE 14 DAY READER) DEVI Use to check glucose levels TID; E11.9  . Continuous Blood Gluc Sensor (FREESTYLE LIBRE 14 DAY SENSOR) MISC Use to check glucose TID. Change every 14 days. E 11.9  . Insulin NPH, Human,, Isophane, (HUMULIN N KWIKPEN) 100 UNIT/ML Kiwkpen Inject 65 Units into the skin every morning. And pen needles 1/day  . losartan (COZAAR) 50 MG tablet Take 1 tablet (50 mg total) by mouth daily.  . rosuvastatin (CRESTOR) 40 MG tablet Take 1 tablet (40 mg total) by mouth daily.  . Semaglutide,0.25 or 0.5MG /DOS, (OZEMPIC, 0.25 OR 0.5 MG/DOSE,) 2 MG/1.5ML SOPN Inject 0.1875 mLs (0.25 mg total) into the skin once a week.   No current facility-administered medications for this visit. (Other)      REVIEW OF SYSTEMS:    ALLERGIES Allergies  Allergen Reactions  . Niaspan  [Niacin] Other (See Comments)    Red skin, red eyes    PAST MEDICAL HISTORY Past Medical History:  Diagnosis Date  . Arthritis   . Depression   . Diabetes mellitus without complication ([place])   . Hyperlipidemia   . Hypertension    Past Surgical History:  Procedure Laterality Date  . TONSILLECTOMY      FAMILY HISTORY Family History  Problem Relation Age of Onset  . Cancer Mother        Uterine   . Early death Mother   . Stroke Mother   . Diabetes Father   . Heart attack Father   . Heart disease Father   . Hyperlipidemia Father     SOCIAL HISTORY Social History   Tobacco Use  . Smoking status: Never Smoker  . Smokeless tobacco: Never Used  Vaping Use  . Vaping Use: Never used  Substance Use Topics  . Alcohol use: Not Currently  . Drug use: Never         OPHTHALMIC EXAM:  Base Eye Exam    Visual Acuity (ETDRS)      Right Left   Dist [place] 20/25 -1 20/25       Tonometry (Tonopen, 10:42 AM)      Right Left   Pressure 16 14  Pupils      Pupils Dark Light Shape React APD   Right PERRL 4 3 Round Brisk None   Left PERRL 4 3 Round Brisk None       Visual Fields (Counting fingers)      Left Right    Full Full       Extraocular Movement      Right Left    Full Full       Neuro/Psych    Oriented x3: Yes   Mood/Affect: Normal       Dilation    Right eye: 1.0% Mydriacyl, 2.5% Phenylephrine @ 10:42 AM          IMAGING AND PROCEDURES  Imaging and Procedures for 04/11/20  Panretinal Photocoagulation - OD - Right Eye       Time Out Confirmed correct patient, procedure, site, and patient consented.   Anesthesia Topical anesthesia was used. Anesthetic medications included Proparacaine 0.5%.   Laser Information The type of laser was diode. Color was yellow. The duration in seconds was 0.02. The spot size was 390 microns. Laser power was 260. Total spots was 680.   Post-op The patient tolerated the procedure well. There were no  complications. The patient received written and verbal post procedure care education.                 ASSESSMENT/PLAN:  No problem-specific Assessment & Plan notes found for this encounter.      ICD-10-CM   1. Proliferative diabetic retinopathy of right eye without macular edema associated with type 2 diabetes mellitus (HCC)  N86.7672 Panretinal Photocoagulation - OD - Right Eye    1.  Return as scheduled for injection of intravitreal antivegF agent  2.    3.  Ophthalmic Meds Ordered this visit:  No orders of the defined types were placed in this encounter.      Return in about 1 week (around 04/18/2020) for dilate, OS, AVASTIN OCT.  There are no Patient Instructions on file for this visit.   Explained the diagnoses, plan, and follow up with the patient and they expressed understanding.  Patient expressed understanding of the importance of proper follow up care.   Clent Demark Oaklan Persons M.D. Diseases & Surgery of the Retina and Vitreous Retina & Diabetic [place] 04/11/20     Abbreviations: M myopia (nearsighted); A astigmatism; H hyperopia (farsighted); P presbyopia; Mrx spectacle prescription;  CTL contact lenses; OD right eye; OS left eye; OU both eyes  XT exotropia; ET esotropia; PEK punctate epithelial keratitis; PEE punctate epithelial erosions; DES dry eye syndrome; MGD meibomian gland dysfunction; ATs artificial tears; PFAT's preservative free artificial tears; [place] nuclear sclerotic cataract; PSC posterior subcapsular cataract; ERM epi-retinal membrane; PVD posterior vitreous detachment; RD retinal detachment; DM diabetes mellitus; DR diabetic retinopathy; NPDR non-proliferative diabetic retinopathy; PDR proliferative diabetic retinopathy; CSME clinically significant macular edema; DME diabetic macular edema; dbh dot blot hemorrhages; CWS cotton wool spot; POAG primary open angle glaucoma; C/D cup-to-disc ratio; HVF humphrey visual field; GVF goldmann visual field; OCT  optical coherence tomography; IOP intraocular pressure; BRVO Branch retinal vein occlusion; CRVO central retinal vein occlusion; CRAO central retinal artery occlusion; BRAO branch retinal artery occlusion; RT retinal tear; SB scleral buckle; PPV pars plana vitrectomy; VH Vitreous hemorrhage; PRP panretinal laser photocoagulation; IVK intravitreal kenalog; VMT vitreomacular traction; MH Macular hole;  NVD neovascularization of the disc; NVE neovascularization elsewhere; AREDS age related eye disease study; ARMD age related macular degeneration; POAG primary open angle glaucoma; EBMD  epithelial/anterior basement membrane dystrophy; ACIOL anterior chamber intraocular lens; IOL intraocular lens; PCIOL posterior chamber intraocular lens; Phaco/IOL phacoemulsification with intraocular lens placement; [place] photorefractive keratectomy; LASIK laser assisted in situ keratomileusis; HTN hypertension; DM diabetes mellitus; COPD chronic obstructive pulmonary disease

## 2020-04-13 ENCOUNTER — Other Ambulatory Visit: Payer: Self-pay

## 2020-04-13 ENCOUNTER — Ambulatory Visit (INDEPENDENT_AMBULATORY_CARE_PROVIDER_SITE_OTHER): Payer: Medicare Other | Admitting: Ophthalmology

## 2020-04-13 ENCOUNTER — Encounter (INDEPENDENT_AMBULATORY_CARE_PROVIDER_SITE_OTHER): Payer: Self-pay | Admitting: Ophthalmology

## 2020-04-13 DIAGNOSIS — E113592 Type 2 diabetes mellitus with proliferative diabetic retinopathy without macular edema, left eye: Secondary | ICD-10-CM | POA: Diagnosis not present

## 2020-04-13 DIAGNOSIS — E113512 Type 2 diabetes mellitus with proliferative diabetic retinopathy with macular edema, left eye: Secondary | ICD-10-CM

## 2020-04-13 MED ORDER — BEVACIZUMAB CHEMO INJECTION 1.25MG/0.05ML SYRINGE FOR KALEIDOSCOPE
1.2500 mg | INTRAVITREAL | Status: AC | PRN
Start: 2020-04-13 — End: 2020-04-13
  Administered 2020-04-13: 1.25 mg via INTRAVITREAL

## 2020-04-13 NOTE — Assessment & Plan Note (Signed)
Intravitreal Avastin OS today, much less retinal thickening thus will repeat today and simply observe hereafter OS

## 2020-04-13 NOTE — Progress Notes (Signed)
04/13/2020     CHIEF COMPLAINT Patient presents for Retina Follow Up   HISTORY OF PRESENT ILLNESS: Andrew Pruitt is a 64 y.o. male who presents to the clinic today for:   HPI    Retina Follow Up    Patient presents with  Diabetic Retinopathy.  In left eye.  Severity is moderate.  Duration of 5 weeks.  Since onset it is stable.  I, the attending physician,  performed the HPI with the patient and updated documentation appropriately.          Comments    5 Week PDR f\u OS. Possible Avastin OS. OCT  Pt states vision is stable. Denies any complaints. BGL: did not check       Last edited by Tilda Franco on 04/13/2020  9:30 AM. (History)      Referring physician: Brunetta Jeans, PA-C [address] [place],  [place] [address]  HISTORICAL INFORMATION:   Selected notes from the MEDICAL RECORD NUMBER    Lab Results  Component Value Date   HGBA1C 12.6 (A) 04/08/2020     CURRENT MEDICATIONS: No current outpatient medications on file. (Ophthalmic Drugs)   No current facility-administered medications for this visit. (Ophthalmic Drugs)   Current Outpatient Medications (Other)  Medication Sig  . Continuous Blood Gluc Receiver (FREESTYLE LIBRE 14 DAY READER) DEVI Use to check glucose levels TID; E11.9  . Continuous Blood Gluc Sensor (FREESTYLE LIBRE 14 DAY SENSOR) MISC Use to check glucose TID. Change every 14 days. E 11.9  . Insulin NPH, Human,, Isophane, (HUMULIN N KWIKPEN) 100 UNIT/ML Kiwkpen Inject 65 Units into the skin every morning. And pen needles 1/day  . losartan (COZAAR) 50 MG tablet Take 1 tablet (50 mg total) by mouth daily.  . rosuvastatin (CRESTOR) 40 MG tablet Take 1 tablet (40 mg total) by mouth daily.  . Semaglutide,0.25 or 0.5MG /DOS, (OZEMPIC, 0.25 OR 0.5 MG/DOSE,) 2 MG/1.5ML SOPN Inject 0.1875 mLs (0.25 mg total) into the skin once a week.   No current facility-administered medications for this visit. (Other)      REVIEW OF SYSTEMS: ROS     Positive for: Endocrine   Last edited by Tilda Franco on 04/13/2020  9:30 AM. (History)       ALLERGIES Allergies  Allergen Reactions  . Niaspan [Niacin] Other (See Comments)    Red skin, red eyes    PAST MEDICAL HISTORY Past Medical History:  Diagnosis Date  . Arthritis   . Depression   . Diabetes mellitus without complication ([place])   . Hyperlipidemia   . Hypertension    Past Surgical History:  Procedure Laterality Date  . TONSILLECTOMY      FAMILY HISTORY Family History  Problem Relation Age of Onset  . Cancer Mother        Uterine   . Early death Mother   . Stroke Mother   . Diabetes Father   . Heart attack Father   . Heart disease Father   . Hyperlipidemia Father     SOCIAL HISTORY Social History   Tobacco Use  . Smoking status: Never Smoker  . Smokeless tobacco: Never Used  Vaping Use  . Vaping Use: Never used  Substance Use Topics  . Alcohol use: Not Currently  . Drug use: Never         OPHTHALMIC EXAM:  Base Eye Exam    Visual Acuity (Snellen - Linear)      Right Left   Dist [place]  20/20 20/30 +       Tonometry (Tonopen, 9:34 AM)      Right Left   Pressure 12 10       Pupils      Pupils Dark Light Shape React APD   Right PERRL 4 3 Round Slow None   Left PERRL 4 3 Round Slow None       Visual Fields (Counting fingers)      Left Right    Full Full       Neuro/Psych    Oriented x3: Yes   Mood/Affect: Normal       Dilation    Left eye: 1.0% Mydriacyl, 2.5% Phenylephrine @ 9:34 AM        Slit Lamp and Fundus Exam    External Exam      Right Left   External Normal Normal       Slit Lamp Exam      Right Left   Lids/Lashes Normal Normal   Conjunctiva/Sclera White and quiet White and quiet   Cornea Clear Clear   Anterior Chamber Deep and quiet Deep and quiet   [place]ris Round and reactive Round and reactive   Lens 1+ Nuclear sclerosis 1+ Nuclear sclerosis   Anterior Vitreous Normal Normal       Fundus Exam      Right  Left   Posterior Vitreous  Normal   Disc  Normal   C/D Ratio  0.45   Macula  Microaneurysms, no macular thickening   Vessels  NPDR-Severe   Periphery  Normal          IMAGING AND PROCEDURES  Imaging and Procedures for 04/13/20  OCT, Retina - OU - Both Eyes       Right Eye Quality was good. Scan locations included subfoveal. Central Foveal Thickness: 284. Progression has improved.   Left Eye Quality was good. Scan locations included subfoveal. Central Foveal Thickness: 288. Progression has improved.   Notes The OD much improved after recent injection Eylea of intravitreal Avastin, OS similarly improved, will repeat injection Avastin OS today       Intravitreal Injection, Pharmacologic Agent - OS - Left Eye       Time Out 04/13/2020. 11:00 AM. Confirmed correct patient, procedure, site, and patient consented.   Anesthesia Topical anesthesia was used. Anesthetic medications included [place] 3.5%.   Procedure Preparation included Tobramycin 0.3%, 10% betadine to eyelids, 5% betadine to ocular surface. A 30 gauge needle was used.   Injection:  1.25 mg Bevacizumab (AVASTIN) SOLN   NDC: [address]-2, Lot: [id number]   Route: Intravitreal, Site: Left Eye, Waste: 0 mg  Post-op Post injection exam found visual acuity of at least counting fingers. The patient tolerated the procedure well. There were no complications. The patient received written and verbal post procedure care education. Post injection medications were not given.                 ASSESSMENT/PLAN:  Proliferative diabetic retinopathy of left eye with macular edema associated with type 2 diabetes mellitus (HCC) Intravitreal Avastin OS today, much less retinal thickening thus will repeat today and simply observe hereafter OS      ICD-10-CM   1. Proliferative diabetic retinopathy of left eye without macular edema associated with type 2 diabetes mellitus (HCC)  M35.3614 OCT, Retina - OU - Both Eyes  2.  Proliferative diabetic retinopathy of left eye with macular edema associated with type 2 diabetes mellitus (HCC)  E31.5400 Intravitreal Injection, Pharmacologic Agent -  OS - Left Eye    Bevacizumab (AVASTIN) SOLN 1.25 mg    1.  Repeat injection intravitreal Avastin OS today, will observation alone OS as the PRP in the past may help stabilize and decrease vegF load  2.  Follow-up examination as scheduled and likely injection of intravitreal Avastin until CSME completely resolves.  3.  Ophthalmic Meds Ordered this visit:  Meds ordered this encounter  Medications  . Bevacizumab (AVASTIN) SOLN 1.25 mg       Return in about 8 weeks (around 06/08/2020), or ,,, OS follow-up in 8 weeks OCT and exam, for ,,,,,,,,As scheduled, OD, AVASTIN OCT.  There are no Patient Instructions on file for this visit.   Explained the diagnoses, plan, and follow up with the patient and they expressed understanding.  Patient expressed understanding of the importance of proper follow up care.   Clent Demark Elif Yonts M.D. Diseases & Surgery of the Retina and Vitreous Retina & Diabetic [place] 04/13/20     Abbreviations: M myopia (nearsighted); A astigmatism; H hyperopia (farsighted); P presbyopia; Mrx spectacle prescription;  CTL contact lenses; OD right eye; OS left eye; OU both eyes  XT exotropia; ET esotropia; PEK punctate epithelial keratitis; PEE punctate epithelial erosions; DES dry eye syndrome; MGD meibomian gland dysfunction; ATs artificial tears; PFAT's preservative free artificial tears; [place] nuclear sclerotic cataract; PSC posterior subcapsular cataract; ERM epi-retinal membrane; PVD posterior vitreous detachment; RD retinal detachment; DM diabetes mellitus; DR diabetic retinopathy; NPDR non-proliferative diabetic retinopathy; PDR proliferative diabetic retinopathy; CSME clinically significant macular edema; DME diabetic macular edema; dbh dot blot hemorrhages; CWS cotton wool spot; POAG primary open angle  glaucoma; C/D cup-to-disc ratio; HVF humphrey visual field; GVF goldmann visual field; OCT optical coherence tomography; IOP intraocular pressure; BRVO Branch retinal vein occlusion; CRVO central retinal vein occlusion; CRAO central retinal artery occlusion; BRAO branch retinal artery occlusion; RT retinal tear; SB scleral buckle; PPV pars plana vitrectomy; VH Vitreous hemorrhage; PRP panretinal laser photocoagulation; IVK intravitreal kenalog; VMT vitreomacular traction; MH Macular hole;  NVD neovascularization of the disc; NVE neovascularization elsewhere; AREDS age related eye disease study; ARMD age related macular degeneration; POAG primary open angle glaucoma; EBMD epithelial/anterior basement membrane dystrophy; ACIOL anterior chamber intraocular lens; IOL intraocular lens; PCIOL posterior chamber intraocular lens; Phaco/IOL phacoemulsification with intraocular lens placement; [place] photorefractive keratectomy; LASIK laser assisted in situ keratomileusis; HTN hypertension; DM diabetes mellitus; COPD chronic obstructive pulmonary disease

## 2020-04-18 ENCOUNTER — Encounter (INDEPENDENT_AMBULATORY_CARE_PROVIDER_SITE_OTHER): Payer: Medicare Other | Admitting: Ophthalmology

## 2020-04-19 ENCOUNTER — Other Ambulatory Visit: Payer: Self-pay

## 2020-04-19 ENCOUNTER — Ambulatory Visit (INDEPENDENT_AMBULATORY_CARE_PROVIDER_SITE_OTHER): Payer: Medicare Other | Admitting: Psychiatry

## 2020-04-19 ENCOUNTER — Encounter (INDEPENDENT_AMBULATORY_CARE_PROVIDER_SITE_OTHER): Payer: Medicare Other | Admitting: Ophthalmology

## 2020-04-19 DIAGNOSIS — E1140 Type 2 diabetes mellitus with diabetic neuropathy, unspecified: Secondary | ICD-10-CM

## 2020-04-19 DIAGNOSIS — F4323 Adjustment disorder with mixed anxiety and depressed mood: Secondary | ICD-10-CM | POA: Diagnosis not present

## 2020-04-19 DIAGNOSIS — Z794 Long term (current) use of insulin: Secondary | ICD-10-CM

## 2020-04-19 MED ORDER — BD PEN NEEDLE MINI U/F 31G X 5 MM MISC: 1.0000 | Freq: Every day | 0 refills | Status: DC

## 2020-04-19 MED ORDER — HUMULIN N KWIKPEN 100 UNIT/ML ~~LOC~~ SUPN: 65.0000 [IU] | PEN_INJECTOR | SUBCUTANEOUS | Status: DC

## 2020-04-19 NOTE — Progress Notes (Signed)
Psychotherapy Progress Note [facility] Andrew Moore, PhD LP  Patient ID: Andrew Pruitt     MRN: [id number] Therapy format: Individual psychotherapy Date: 04/19/2020      Start: 9:13a     Stop: 10:04a     Time Spent: 51 min Location: In-person   Session narrative (presenting needs, interim history, self-report of stressors and symptoms, applications of prior therapy, status changes, and interventions made in session) Bothered further by developments with sD and SIL -- "half-assed' repairs, ignorant choices, committing large amounts of money when they owe him and W $20K, and they bought another house, in [place], which PT sees as unwise, and revealing unilateral plans to move themselves and PT & W there, with PT & W in a fully built basement apartment.  While it may be thoughtful to find the space and semi-detachment, it's still commitment without consulting, and coming clearer that PT and W do not want to remain here but will prefer to return to [place], [place] if at all possible.  Visited last week, wife very interested in going back, partly for how the real estate system is more streamlined there.  Pt also very interested, likes the people and property better, and close to some other family members.    PT looking for 2nd job to go ahead and make enough money to go out on their own.  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  Trying to sell some family land in [place] to see if he can free up more of the $100K they will need to relocate.  Estimates it will take a year, unless her real estate productivity stays active.  Will have to go along with the [facility] plan until then.    Has established better boundaries about finances, gotten daughter out of their checking account.  She is asking for rent in addition to the $250/mo food portion, has noted having helped PT & W by paying off their cars, changing auto insurance, other matters.  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  Personally, bothered by watching sD and SIL's hasty judgment and how his experience and wisdom get pooh-poohed regularly for everything from real estate values to building to how it's not good for Curt's tools to sit out in the rain.  Does feel like lines of authority over personal finance are established well enough to tolerate the rest but gets drawn into debates still about why to do things one way or another. Trying to use his own capacity to notice feeling heated and take a personal timeout.  Not always working, but significant amount of the time.  Insight in session that his father instilled a sense of never good enough in him, cultured a need to prove when questioned, that perhaps he can afford to let pass better with other people.  Affirmed and encouraged.  Discussed outlook for therapy -- feels he has the direction, means, and validation to go on as-needed, remains welcome at his interest.  Therapeutic modalities: Cognitive Behavioral Therapy, Solution-Oriented/Positive Psychology and Assertiveness/Communication  Mental Status/Observations:  Appearance:   Casual     Behavior:  Appropriate  Motor:  Normal  Speech/Language:   Clear and Coherent  Affect:  Appropriate  Mood:  normal and a little down  Thought process:  normal  Thought content:    WNL  Sensory/Perceptual disturbances:    WNL  Orientation:  Fully oriented  Attention:  Good    Concentration:  Good  Memory:  WNL  Insight:    Good  Judgment:   Good  Impulse Control:  Good   Risk Assessment: Danger to Self: No Self-injurious Behavior: No Danger to Others: No Physical Aggression / Violence: No Duty to Warn: No Access to Firearms a concern: No  Assessment of progress:  progressing  Diagnosis:   ICD-10-CM   1. Adjustment disorder with mixed anxiety and depressed mood   F43.23    Plan:  . Practice being alert to when a conversation is a "nonstarter" or "slippery slope" into intractable debate and offer to table it, agree to disagree, or just not have to work it out . When pressed by daughter or son-in-law about doing things for him and W or giving unsolicited advice, consider just asking, without sarcasm, "How much do you need to fix that for me/us?" . Other recommendations/advice as may be noted above . Continue to utilize previously learned skills ad lib . Maintain medication as prescribed and work faithfully with relevant prescriber(s) if any changes are desired or seem indicated . Call the clinic on-call service, present to ER, or call 911 if any life-threatening psychiatric crisis Return for time at discretion. . Already scheduled visit in this office Visit date not found.  Blanchie Serve, PhD Andrew Moore, PhD LP Clinical Psychologist, [facility] [address] [place], [place] [address] [phone]

## 2020-04-20 ENCOUNTER — Ambulatory Visit (INDEPENDENT_AMBULATORY_CARE_PROVIDER_SITE_OTHER): Payer: Medicare Other | Admitting: Ophthalmology

## 2020-04-20 ENCOUNTER — Encounter (INDEPENDENT_AMBULATORY_CARE_PROVIDER_SITE_OTHER): Payer: Self-pay | Admitting: Ophthalmology

## 2020-04-20 DIAGNOSIS — E113591 Type 2 diabetes mellitus with proliferative diabetic retinopathy without macular edema, right eye: Secondary | ICD-10-CM | POA: Diagnosis not present

## 2020-04-20 DIAGNOSIS — E113511 Type 2 diabetes mellitus with proliferative diabetic retinopathy with macular edema, right eye: Secondary | ICD-10-CM | POA: Insufficient documentation

## 2020-04-20 MED ORDER — BEVACIZUMAB CHEMO INJECTION 1.25MG/0.05ML SYRINGE FOR KALEIDOSCOPE
1.2500 mg | INTRAVITREAL | Status: AC | PRN
  Administered 2020-04-20: 1.25 mg via INTRAVITREAL

## 2020-04-20 NOTE — Progress Notes (Signed)
04/20/2020     CHIEF COMPLAINT Patient presents for Retina Follow Up   HISTORY OF PRESENT ILLNESS: Andrew Pruitt is a 64 y.o. male who presents to the clinic today for:   HPI    Retina Follow Up    Patient presents with  Diabetic Retinopathy.  In right eye.  Duration of 5 weeks.  Since onset it is stable.          Comments    5 week follow up - Poss Avastin OD Patient denies change in vision and overall has no complaints. LBS 193 /// A1C 17       Last edited by Gerda Diss on 04/20/2020  8:30 AM. (History)      Referring physician: Brunetta Jeans, PA-C [id number] [place],  [place] [id number]  HISTORICAL INFORMATION:   Selected notes from the MEDICAL RECORD NUMBER    Lab Results  Component Value Date   HGBA1C 12.6 (A) 04/08/2020     CURRENT MEDICATIONS: No current outpatient medications on file. (Ophthalmic Drugs)   No current facility-administered medications for this visit. (Ophthalmic Drugs)   Current Outpatient Medications (Other)  Medication Sig   Continuous Blood Gluc Receiver (FREESTYLE LIBRE 14 DAY READER) DEVI Use to check glucose levels TID; E11.9   Continuous Blood Gluc Sensor (FREESTYLE LIBRE 14 DAY SENSOR) MISC Use to check glucose TID. Change every 14 days. E 11.9   Insulin NPH, Human,, Isophane, (HUMULIN N KWIKPEN) 100 UNIT/ML Kiwkpen Inject 65 Units into the skin every morning.   Insulin Pen Needle (B-D UF III MINI PEN NEEDLES) 31G X 5 MM MISC 1 each by Other route daily. E11.9   losartan (COZAAR) 50 MG tablet Take 1 tablet (50 mg total) by mouth daily.   rosuvastatin (CRESTOR) 40 MG tablet Take 1 tablet (40 mg total) by mouth daily.   Semaglutide,0.25 or 0.5MG /DOS, (OZEMPIC, 0.25 OR 0.5 MG/DOSE,) 2 MG/1.5ML SOPN Inject 0.1875 mLs (0.25 mg total) into the skin once a week.   No current facility-administered medications for this visit. (Other)      REVIEW OF SYSTEMS:    ALLERGIES Allergies  Allergen Reactions    Niaspan [Niacin] Other (See Comments)    Red skin, red eyes    PAST MEDICAL HISTORY Past Medical History:  Diagnosis Date   Arthritis    Depression    Diabetes mellitus without complication ([place])    Hyperlipidemia    Hypertension    Past Surgical History:  Procedure Laterality Date   TONSILLECTOMY      FAMILY HISTORY Family History  Problem Relation Age of Onset   Cancer Mother        Uterine    Early death Mother    Stroke Mother    Diabetes Father    Heart attack Father    Heart disease Father    Hyperlipidemia Father     SOCIAL HISTORY Social History   Tobacco Use   Smoking status: Never Smoker   Smokeless tobacco: Never Used  Scientific laboratory technician Use: Never used  Substance Use Topics   Alcohol use: Not Currently   Drug use: Never         OPHTHALMIC EXAM:  Base Eye Exam    Visual Acuity (Snellen - Linear)      Right Left   Dist [place] 20/30-2 20/40-1   Dist ph [place]  20/25+2       Tonometry (Tonopen, 8:29 AM)      Right  Left   Pressure 19 17       Pupils      Pupils Dark Light Shape React APD   Right PERRL 4 3 Round Slow None   Left PERRL 4 3 Round Slow None       Visual Fields (Counting fingers)      Left Right    Full Full       Extraocular Movement      Right Left    Full Full       Neuro/Psych    Oriented x3: Yes   Mood/Affect: Normal       Dilation    Right eye: 1.0% Mydriacyl, 2.5% Phenylephrine @ 8:29 AM        Slit Lamp and Fundus Exam    External Exam      Right Left   External Normal Normal       Slit Lamp Exam      Right Left   Lids/Lashes Normal Normal   Conjunctiva/Sclera White and quiet White and quiet   Cornea Clear Clear   Anterior Chamber Deep and quiet Deep and quiet   Iris Round and reactive Round and reactive   Lens 2+ Nuclear sclerosis 1+ Nuclear sclerosis   Anterior Vitreous Normal Normal       Fundus Exam      Right Left   Posterior Vitreous Normal    Disc Normal    C/D  Ratio 0.3    Macula Microaneurysms, no macular thickening    Vessels NPDR-Severe    Periphery Normal           IMAGING AND PROCEDURES  Imaging and Procedures for 04/20/20  Intravitreal Injection, Pharmacologic Agent - OD - Right Eye       Time Out 04/20/2020. 9:08 AM. Confirmed correct patient, procedure, site, and patient consented.   Anesthesia Topical anesthesia was used. Anesthetic medications included Akten 3.5%.   Procedure Preparation included Tobramycin 0.3%, 10% betadine to eyelids, 5% betadine to ocular surface. A 30 gauge needle was used.   Injection:  1.25 mg Bevacizumab (AVASTIN) SOLN   NDC: [address]-6, Lot: [id number]   Route: Intravitreal, Site: Right Eye, Waste: 0 mg  Post-op Post injection exam found visual acuity of at least counting fingers. The patient tolerated the procedure well. There were no complications. The patient received written and verbal post procedure care education. Post injection medications were not given.                 ASSESSMENT/PLAN:  Diabetic macular edema of right eye with proliferative retinopathy associated with type 2 diabetes mellitus (HCC) OD status post recent PRP #1 for extensive retinal nonperfusion, in order to decrease vegF production.  Center involved CSME has improved little over 1 month after first injection.  We will repeat injection today and examination for possible Avastin repeat OD at 8 weeks      ICD-10-CM   1. Diabetic macular edema of right eye with proliferative retinopathy associated with type 2 diabetes mellitus (HCC)  P54.6568 Intravitreal Injection, Pharmacologic Agent - OD - Right Eye    Bevacizumab (AVASTIN) SOLN 1.25 mg  2. Proliferative diabetic retinopathy of right eye without macular edema associated with type 2 diabetes mellitus ([place])  E11.3591     1.  PDR OU, status post PRP #1 OU  2.  Involved CSME OD, status post Avastin No. 1, repeat today reexamination in 8 weeks  3.  Return for  examination OS as scheduled  Ophthalmic Meds  Ordered this visit:  Meds ordered this encounter  Medications   Bevacizumab (AVASTIN) SOLN 1.25 mg       Return in about 8 weeks (around 06/15/2020) for dilate, OD, AVASTIN OCT.  There are no Patient Instructions on file for this visit.   Explained the diagnoses, plan, and follow up with the patient and they expressed understanding.  Patient expressed understanding of the importance of proper follow up care.   Clent Demark Jianni Shelden M.D. Diseases & Surgery of the Retina and Vitreous Retina & Diabetic [place] 04/20/20     Abbreviations: M myopia (nearsighted); A astigmatism; H hyperopia (farsighted); P presbyopia; Mrx spectacle prescription;  CTL contact lenses; OD right eye; OS left eye; OU both eyes  XT exotropia; ET esotropia; PEK punctate epithelial keratitis; PEE punctate epithelial erosions; DES dry eye syndrome; MGD meibomian gland dysfunction; ATs artificial tears; PFAT's preservative free artificial tears; [place] nuclear sclerotic cataract; PSC posterior subcapsular cataract; ERM epi-retinal membrane; PVD posterior vitreous detachment; RD retinal detachment; DM diabetes mellitus; DR diabetic retinopathy; NPDR non-proliferative diabetic retinopathy; PDR proliferative diabetic retinopathy; CSME clinically significant macular edema; DME diabetic macular edema; dbh dot blot hemorrhages; CWS cotton wool spot; POAG primary open angle glaucoma; C/D cup-to-disc ratio; HVF humphrey visual field; GVF goldmann visual field; OCT optical coherence tomography; IOP intraocular pressure; BRVO Branch retinal vein occlusion; CRVO central retinal vein occlusion; CRAO central retinal artery occlusion; BRAO branch retinal artery occlusion; RT retinal tear; SB scleral buckle; PPV pars plana vitrectomy; VH Vitreous hemorrhage; PRP panretinal laser photocoagulation; IVK intravitreal kenalog; VMT vitreomacular traction; MH Macular hole;  NVD neovascularization of the  disc; NVE neovascularization elsewhere; AREDS age related eye disease study; ARMD age related macular degeneration; POAG primary open angle glaucoma; EBMD epithelial/anterior basement membrane dystrophy; ACIOL anterior chamber intraocular lens; IOL intraocular lens; PCIOL posterior chamber intraocular lens; Phaco/IOL phacoemulsification with intraocular lens placement; [place] photorefractive keratectomy; LASIK laser assisted in situ keratomileusis; HTN hypertension; DM diabetes mellitus; COPD chronic obstructive pulmonary disease

## 2020-04-20 NOTE — Assessment & Plan Note (Signed)
OD status post recent PRP #1 for extensive retinal nonperfusion, in order to decrease vegF production.  Center involved CSME has improved little over 1 month after first injection.  We will repeat injection today and examination for possible Avastin repeat OD at 8 weeks

## 2020-04-26 LAB — COLOGUARD: Cologuard: POSITIVE — AB

## 2020-05-02 ENCOUNTER — Telehealth: Payer: Self-pay | Admitting: Emergency Medicine

## 2020-05-02 ENCOUNTER — Other Ambulatory Visit: Payer: Self-pay | Admitting: Emergency Medicine

## 2020-05-02 DIAGNOSIS — R195 Other fecal abnormalities: Secondary | ICD-10-CM

## 2020-05-02 NOTE — Telephone Encounter (Signed)
Positive Cologuard. Needs referral to GI for diagnostic colonoscopy. Ok to place referral to GI for consult, etc.

## 2020-05-02 NOTE — Telephone Encounter (Signed)
Cologuard test completed on 04/26/20 is positive.  Report in your folder for review

## 2020-05-03 NOTE — Telephone Encounter (Signed)
Advised patient of positive cologuard results, will proceed with referral to GI for diagnostic colonoscopy. Referral placed

## 2020-05-03 NOTE — Addendum Note (Signed)
Addended by: Leonidas Romberg on: 05/03/2020 02:57 PM   Modules accepted: Orders

## 2020-05-04 ENCOUNTER — Encounter: Payer: Self-pay | Admitting: Gastroenterology

## 2020-05-04 ENCOUNTER — Other Ambulatory Visit: Payer: Self-pay | Admitting: Physician Assistant

## 2020-05-05 ENCOUNTER — Other Ambulatory Visit: Payer: Self-pay | Admitting: Physician Assistant

## 2020-05-18 ENCOUNTER — Ambulatory Visit (INDEPENDENT_AMBULATORY_CARE_PROVIDER_SITE_OTHER): Payer: Medicare Other | Admitting: Endocrinology

## 2020-05-18 ENCOUNTER — Encounter: Payer: Self-pay | Admitting: Endocrinology

## 2020-05-18 ENCOUNTER — Other Ambulatory Visit: Payer: Self-pay

## 2020-05-18 VITALS — BP 152/80 | HR 77 | Ht 70.0 in | Wt 270.0 lb

## 2020-05-18 DIAGNOSIS — E1140 Type 2 diabetes mellitus with diabetic neuropathy, unspecified: Secondary | ICD-10-CM

## 2020-05-18 DIAGNOSIS — Z794 Long term (current) use of insulin: Secondary | ICD-10-CM | POA: Diagnosis not present

## 2020-05-18 LAB — POCT GLYCOSYLATED HEMOGLOBIN (HGB A1C): Hemoglobin A1C: 11.7 % — AB (ref 4.0–5.6)

## 2020-05-18 MED ORDER — NOVOLIN N FLEXPEN 100 UNIT/ML ~~LOC~~ SUPN: 80.0000 [IU] | PEN_INJECTOR | SUBCUTANEOUS | 11 refills | Status: DC

## 2020-05-18 MED ORDER — TRULICITY 1.5 MG/0.5ML ~~LOC~~ SOAJ: 1.5000 mg | SUBCUTANEOUS | 3 refills | Status: DC

## 2020-05-18 NOTE — Patient Instructions (Addendum)
Your blood pressure is high today.  Please see your primary care provider soon, to have it rechecked check your blood sugar twice a day.  vary the time of day when you check, between before the 3 meals, and at bedtime.  also check if you have symptoms of your blood sugar being too high or too low.  please keep a record of the readings and bring it to your next appointment here (or you can bring the meter itself).  You can write it on any piece of paper.  please call us sooner if your blood sugar goes below 70, or if you have a lot of readings over 200. We will need to take this complex situation in stages.   Please change the NPH insulin to "Novolin" 80 units each morning.    I have also sent a prescription to your pharmacy, to double the Trulicity Please come back for a follow-up appointment in 6 weeks.

## 2020-05-18 NOTE — Progress Notes (Signed)
   Subjective:    Patient ID: Andrew Pruitt, male    DOB: 1955-11-04, 64 y.o.   MRN: [id number]  HPI Pt returns for f/u of diabetes mellitus: DM type: Insulin-requiring type 2 Dx'ed: 9675 Complications: PN, PDR, and CRI Therapy: insulin since 2005 DKA: never Severe hypoglycemia: never Pancreatitis: never Pancreatic imaging: never SDOH: none Other: she declines multiple daily injections Interval history: pt says glucoses are in the 200's-300's.  pt states he feels well in general. He takes meds as rx'ed   Review of Systems He denies hypoglycemia and nausea.      Objective:   Physical Exam VITAL SIGNS:  See vs page GENERAL: no distress Pulses: dorsalis pedis intact bilat.   MSK: no deformity of the feet CV: 1+ bilat leg edema Skin:  no ulcer on the feet.  normal color and temp on the feet. Neuro: sensation is intact to touch on the feet.     Lab Results  Component Value Date   HGBA1C 11.7 (A) 05/18/2020   Assessment & Plan:  Insulin-requiring type 2 DM.  uncontrolled     Patient Instructions  Your blood pressure is high today.  Please see your primary care provider soon, to have it rechecked check your blood sugar twice a day.  vary the time of day when you check, between before the 3 meals, and at bedtime.  also check if you have symptoms of your blood sugar being too high or too low.  please keep a record of the readings and bring it to your next appointment here (or you can bring the meter itself).  You can write it on any piece of paper.  please call us sooner if your blood sugar goes below 70, or if you have a lot of readings over 200. We will need to take this complex situation in stages.   Please change the NPH insulin to "Novolin" 80 units each morning.    I have also sent a prescription to your pharmacy, to double the Trulicity Please come back for a follow-up appointment in 6 weeks.

## 2020-05-20 ENCOUNTER — Telehealth: Payer: Self-pay | Admitting: Physician Assistant

## 2020-05-20 DIAGNOSIS — I152 Hypertension secondary to endocrine disorders: Secondary | ICD-10-CM

## 2020-05-20 NOTE — Telephone Encounter (Signed)
Patient thinks he is having a reaction to the Losartin that he takes.  He has developed a cough and feels like it is coming from this medication.  Patient states he has had this before with an AC blocker - Please Advise.

## 2020-05-23 ENCOUNTER — Ambulatory Visit
Admission: EM | Admit: 2020-05-23 | Discharge: 2020-05-23 | Disposition: A | Discharge status: Home / Self Care | Payer: Medicare Other | Attending: Physician Assistant | Admitting: Physician Assistant

## 2020-05-23 ENCOUNTER — Other Ambulatory Visit: Payer: Self-pay

## 2020-05-23 DIAGNOSIS — R059 Cough, unspecified: Secondary | ICD-10-CM

## 2020-05-23 DIAGNOSIS — Z1152 Encounter for screening for COVID-19: Secondary | ICD-10-CM | POA: Diagnosis not present

## 2020-05-23 MED ORDER — BENZONATATE 200 MG PO CAPS: 200.0000 mg | ORAL_CAPSULE | Freq: Three times a day (TID) | ORAL | 0 refills | Status: DC

## 2020-05-23 NOTE — ED Provider Notes (Signed)
[facility]    CSN: [id number] Arrival date & time: 05/23/20  1719      History   Chief Complaint Chief Complaint  Patient presents with  . Cough  . Fatigue    HPI Andrew Pruitt is a 64 y.o. male.   64 year old male comes in for 1 week history of cough. States cough is nonproductive without shortness of breath. No obvious rhinorrhea, nasal congestion. States some irritation/itching to the throat that is causing the cough. Denies fever, chills, body aches. Denies abdominal pain, nausea, vomiting, diarrhea. Denies loss of taste/smell. Never smoker  Last a1c 11.7  States thought it could be his BP meds, so stopped for 3 days, no improvement. Chart review indicates he is on losartan, but he is unsure if this is correct.      Past Medical History:  Diagnosis Date  . Arthritis   . Depression   . Diabetes mellitus without complication ([place])   . Hyperlipidemia   . Hypertension     Patient Active Problem List   Diagnosis Date Noted  . Diabetic macular edema of right eye with proliferative retinopathy associated with type 2 diabetes mellitus ([place]) 04/20/2020  . Proliferative diabetic retinopathy of right eye ([place]) 03/02/2020  . Proliferative diabetic retinopathy of left eye with macular edema associated with type 2 diabetes mellitus ([place]) 03/02/2020  . Type 2 diabetes mellitus with diabetic neuropathy, with long-term current use of insulin ([place]) 02/22/2020  . Uncontrolled type II diabetes mellitus with chronic kidney disease ([place]) 02/22/2020  . Hypertension associated with diabetes ([place]) 02/22/2020  . Hyperlipidemia associated with type 2 diabetes mellitus ([place]) 02/22/2020  . Prostate cancer screening 02/22/2020  . Plantar warts 02/22/2020    Past Surgical History:  Procedure Laterality Date  . TONSILLECTOMY         Home Medications    Prior to Admission medications   Medication Sig Start Date End Date Taking? Authorizing Provider  benzonatate  (TESSALON) 200 MG capsule Take 1 capsule (200 mg total) by mouth every 8 (eight) hours. 05/23/20   Ok Edwards, PA-C  Continuous Blood Gluc Receiver (FREESTYLE LIBRE 14 DAY READER) DEVI Use to check glucose levels TID; E11.9 Patient not taking: Reported on 05/18/2020 02/17/20   Brunetta Jeans, PA-C  Continuous Blood Gluc Sensor (FREESTYLE LIBRE 14 DAY SENSOR) MISC Use to check glucose TID. Change every 14 days. E 11.9 Patient not taking: Reported on 05/18/2020 02/17/20   Brunetta Jeans, PA-C  Dulaglutide (TRULICITY) 1.5 KV/4.2VZ SOPN Inject 0.5 mLs (1.5 mg total) into the skin once a week. 05/18/20   Renato Shin, MD  Insulin NPH, Human,, Isophane, (NOVOLIN N FLEXPEN) 100 UNIT/ML Kiwkpen Inject 80 Units into the skin every morning. 05/18/20   Renato Shin, MD  Insulin Pen Needle (B-D UF III MINI PEN NEEDLES) 31G X 5 MM MISC 1 each by Other route daily. E11.9 04/19/20   Renato Shin, MD  losartan (COZAAR) 50 MG tablet Take 1 tablet (50 mg total) by mouth daily. 05/04/20   Brunetta Jeans, PA-C  rosuvastatin (CRESTOR) 40 MG tablet TAKE 1 TABLET BY MOUTH EVERY DAY 05/05/20   Brunetta Jeans, PA-C    Family History Family History  Problem Relation Age of Onset  . Cancer Mother        Uterine   . Early death Mother   . Stroke Mother   . Diabetes Father   . Heart attack Father   . Heart disease Father   .  Hyperlipidemia Father     Social History Social History   Tobacco Use  . Smoking status: Never Smoker  . Smokeless tobacco: Never Used  Vaping Use  . Vaping Use: Never used  Substance Use Topics  . Alcohol use: Not Currently  . Drug use: Never     Allergies   Niaspan [niacin]   Review of Systems Review of Systems  Reason unable to perform ROS: See HPI as above.     Physical Exam Triage Vital Signs ED Triage Vitals  Enc Vitals Group     BP 05/23/20 1801 (!) 150/84     Pulse Rate 05/23/20 1801 98     Resp 05/23/20 1801 16     Temp 05/23/20 1801 99.4 F (37.4 C)     Temp  Source 05/23/20 1801 Oral     SpO2 05/23/20 1801 100 %     Weight --      Height --      Head Circumference --      Peak Flow --      Pain Score 05/23/20 1802 0     Pain Loc --      Pain Edu? --      Excl. in [place]? --    No data found.  Updated Vital Signs BP (!) 150/84 (BP Location: Right Arm)   Pulse 98   Temp 99.4 F (37.4 C) (Oral)   Resp 16   SpO2 100%   Physical Exam Constitutional:      General: He is not in acute distress.    Appearance: Normal appearance. He is not ill-appearing, toxic-appearing or diaphoretic.  HENT:     Head: Normocephalic and atraumatic.     Mouth/Throat:     Mouth: Mucous membranes are moist.     Pharynx: Oropharynx is clear. Uvula midline.  Cardiovascular:     Rate and Rhythm: Normal rate and regular rhythm.     Heart sounds: Normal heart sounds. No murmur heard.  No friction rub. No gallop.   Pulmonary:     Effort: Pulmonary effort is normal. No accessory muscle usage, prolonged expiration, respiratory distress or retractions.     Comments: Lungs clear to auscultation without adventitious lung sounds. Musculoskeletal:     Cervical back: Normal range of motion and neck supple.  Neurological:     General: No focal deficit present.     Mental Status: He is alert and oriented to person, place, and time.      UC Treatments / Results  Labs (all labs ordered are listed, but only abnormal results are displayed) Labs Reviewed  NOVEL CORONAVIRUS, NAA    EKG   Radiology No results found.  Procedures Procedures (including critical care time)  Medications Ordered in UC Medications - No data to display  Initial Impression / Assessment and Plan / UC Course  I have reviewed the triage vital signs and the nursing notes.  Pertinent labs & imaging results that were available during my care of the patient were reviewed by me and considered in my medical decision making (see chart for details).    COVID PCR test ordered. Patient to  quarantine until testing results return. No alarming signs on exam. LCTAB. Symptomatic treatment discussed.  Push fluids.  Return precautions given.  Patient expresses understanding and agrees to plan.  Final Clinical Impressions(s) / UC Diagnoses   Final diagnoses:  Encounter for screening for COVID-19  Cough    ED Prescriptions    Medication Sig Dispense Auth. Provider  benzonatate (TESSALON) 200 MG capsule Take 1 capsule (200 mg total) by mouth every 8 (eight) hours. 21 capsule Ok Edwards, PA-C     PDMP not reviewed this encounter.   Ok Edwards, PA-C 05/23/20 1900

## 2020-05-23 NOTE — Discharge Instructions (Signed)
COVID PCR testing ordered. I would like you to quarantine until testing results. Tessalon for cough. Zyrtec for throat irritation, possibly from drainage/allergies. You can take over the counter flonase/nasacort to help with nasal congestion/drainage. Tylenol/motrin for pain and fever. Keep hydrated, urine should be clear to pale yellow in color. If experiencing shortness of breath, trouble breathing, go to the emergency department for further evaluation needed. Otherwise follow up with PCP for further evaluation.

## 2020-05-23 NOTE — Telephone Encounter (Signed)
Pt called in checking on this, please advise  

## 2020-05-23 NOTE — ED Triage Notes (Signed)
Pt present a cough with fatigue, symptoms started 1 week ago. Pt cough is non productive.

## 2020-05-24 NOTE — Telephone Encounter (Signed)
Since he is a diabetic we would want to try to have him on a medication in this class if possible to help protect kidneys from the diabetes. I would recommend that we consider switching from losartan to another ARB to get this protection but to see if symptoms abate. The other option would be to switch to a different class of medicine altogether and monitor microalbumin to make sure ratios are not climbing indicating injury to small blood vessels in the kidneys from diabetes.   If he is ok with switching to medication in same class, lets switch from losartan 50 to Valsartan 80 and do a follow-up in 10-14 days to recheck symptoms and BP.

## 2020-05-24 NOTE — Telephone Encounter (Signed)
LMOVM to call back about changing medications

## 2020-05-26 LAB — NOVEL CORONAVIRUS, NAA: SARS-CoV-2, NAA: DETECTED — AB

## 2020-05-27 MED ORDER — VALSARTAN 80 MG PO TABS: 80.0000 mg | ORAL_TABLET | Freq: Every day | ORAL | 1 refills | Status: DC

## 2020-05-27 NOTE — Addendum Note (Signed)
Addended by: Leonidas Romberg on: 05/27/2020 02:44 PM   Modules accepted: Orders

## 2020-05-27 NOTE — Telephone Encounter (Signed)
Left a detailed message on patient VM about PCP wanting to change his Losartan to Valsartan to help protect his kidneys due to being diabetic. rx sent to pharmacy Valsartan 80 mg to take and recheck blood pressures and make sure symptoms improve with change in medication.

## 2020-06-07 ENCOUNTER — Ambulatory Visit (INDEPENDENT_AMBULATORY_CARE_PROVIDER_SITE_OTHER): Payer: Medicare Other | Admitting: Podiatry

## 2020-06-07 ENCOUNTER — Encounter: Payer: Self-pay | Admitting: Podiatry

## 2020-06-07 ENCOUNTER — Other Ambulatory Visit: Payer: Self-pay

## 2020-06-07 DIAGNOSIS — B079 Viral wart, unspecified: Secondary | ICD-10-CM | POA: Insufficient documentation

## 2020-06-07 DIAGNOSIS — B070 Plantar wart: Secondary | ICD-10-CM | POA: Diagnosis not present

## 2020-06-07 NOTE — Progress Notes (Signed)
This patient presents to the office today for treatment of multiple skin lesions on the bottom of both feet.  Patient has previously been diagnosed as having plantar warts both feet.  Patient is a diabetic with neuropathy.  Patient also has chronic kidney disease.  He presents the office today for treatment of these multiple skin lesions both feet.  Vascular  Dorsalis pedis and posterior tibial pulses are weakly  palpable  B/L.  Capillary return  WNL.  Temperature gradient is  WNL.  Skin turgor  WNL  Sensorium  Senn Weinstein monofilament wire  Diminished . Diminished  tactile sensation.  Nail Exam  Patient has normal nails with no evidence of bacterial or fungal infection.  Orthopedic  Exam  Muscle tone and muscle strength  WNL.  No limitations of motion feet  B/L.  No crepitus or joint effusion noted.  Foot type is unremarkable and digits show no abnormalities.  Bony prominences are unremarkable.  Skin  No open lesions.  Normal skin texture and turgor. Multiple skin lesions plantar aspect both feet.  Verrucae/Porokeratosis  B/L  Debridement of the multiple skin lesions with a #15 blade and smoothing with a dermal tool.  Patient was told to return to the office  3 months.   Gardiner Barefoot DPM

## 2020-06-08 ENCOUNTER — Ambulatory Visit (INDEPENDENT_AMBULATORY_CARE_PROVIDER_SITE_OTHER): Payer: Medicare Other | Admitting: Ophthalmology

## 2020-06-08 ENCOUNTER — Encounter (INDEPENDENT_AMBULATORY_CARE_PROVIDER_SITE_OTHER): Payer: Medicare Other | Admitting: Ophthalmology

## 2020-06-08 ENCOUNTER — Encounter (INDEPENDENT_AMBULATORY_CARE_PROVIDER_SITE_OTHER): Payer: Self-pay | Admitting: Ophthalmology

## 2020-06-08 DIAGNOSIS — E113512 Type 2 diabetes mellitus with proliferative diabetic retinopathy with macular edema, left eye: Secondary | ICD-10-CM

## 2020-06-08 DIAGNOSIS — E113592 Type 2 diabetes mellitus with proliferative diabetic retinopathy without macular edema, left eye: Secondary | ICD-10-CM | POA: Diagnosis not present

## 2020-06-08 DIAGNOSIS — E113511 Type 2 diabetes mellitus with proliferative diabetic retinopathy with macular edema, right eye: Secondary | ICD-10-CM | POA: Diagnosis not present

## 2020-06-08 NOTE — Assessment & Plan Note (Signed)
CSME OD has continued a slow steady improvement.  Now status post PRP.  Will observe

## 2020-06-08 NOTE — Assessment & Plan Note (Signed)
CSME OS has also resolved.  Continue to monitor.  Follow-up in 3 months

## 2020-06-08 NOTE — Progress Notes (Signed)
06/08/2020     CHIEF COMPLAINT Patient presents for Retina Follow Up   HISTORY OF PRESENT ILLNESS: Andrew Pruitt is a 64 y.o. male who presents to the clinic today for:   HPI    Retina Follow Up    Patient presents with  Diabetic Retinopathy.  In left eye.  Severity is moderate.  Duration of 8.  Since onset it is stable.  I, the attending physician,  performed the HPI with the patient and updated documentation appropriately.          Comments    8 Week PDR f\u OS. Possible Avastin OS. OCT  Pt states no changes in vision. Denies any complaints. BGL: 305 currently       Last edited by Tilda Franco on 06/08/2020  8:24 AM. (History)      Referring physician: Brunetta Jeans, PA-C [address] [place],  [place] [address]  HISTORICAL INFORMATION:   Selected notes from the MEDICAL RECORD NUMBER    Lab Results  Component Value Date   HGBA1C 11.7 (A) 05/18/2020     CURRENT MEDICATIONS: No current outpatient medications on file. (Ophthalmic Drugs)   No current facility-administered medications for this visit. (Ophthalmic Drugs)   Current Outpatient Medications (Other)  Medication Sig  . benzonatate (TESSALON) 200 MG capsule Take 1 capsule (200 mg total) by mouth every 8 (eight) hours.  . Continuous Blood Gluc Receiver (FREESTYLE LIBRE 14 DAY READER) DEVI Use to check glucose levels TID; E11.9  . Continuous Blood Gluc Sensor (FREESTYLE LIBRE 14 DAY SENSOR) MISC Use to check glucose TID. Change every 14 days. E 11.9  . Dulaglutide (TRULICITY) 1.5 IO/0.3TD SOPN Inject 0.5 mLs (1.5 mg total) into the skin once a week.  . Insulin NPH, Human,, Isophane, (NOVOLIN N FLEXPEN) 100 UNIT/ML Kiwkpen Inject 80 Units into the skin every morning.  . Insulin Pen Needle (B-D UF III MINI PEN NEEDLES) 31G X 5 MM MISC 1 each by Other route daily. E11.9  . rosuvastatin (CRESTOR) 40 MG tablet TAKE 1 TABLET BY MOUTH EVERY DAY  . valsartan (DIOVAN) 80 MG tablet Take 1 tablet (80 mg  total) by mouth daily.   No current facility-administered medications for this visit. (Other)      REVIEW OF SYSTEMS: ROS    Positive for: Endocrine   Last edited by Tilda Franco on 06/08/2020  8:22 AM. (History)       ALLERGIES Allergies  Allergen Reactions  . Niaspan [Niacin] Other (See Comments)    Red skin, red eyes    PAST MEDICAL HISTORY Past Medical History:  Diagnosis Date  . Arthritis   . Depression   . Diabetes mellitus without complication ([place])   . Hyperlipidemia   . Hypertension    Past Surgical History:  Procedure Laterality Date  . TONSILLECTOMY      FAMILY HISTORY Family History  Problem Relation Age of Onset  . Cancer Mother        Uterine   . Early death Mother   . Stroke Mother   . Diabetes Father   . Heart attack Father   . Heart disease Father   . Hyperlipidemia Father     SOCIAL HISTORY Social History   Tobacco Use  . Smoking status: Never Smoker  . Smokeless tobacco: Never Used  Vaping Use  . Vaping Use: Never used  Substance Use Topics  . Alcohol use: Not Currently  . Drug use: Never  OPHTHALMIC EXAM: Base Eye Exam    Visual Acuity (Snellen - Linear)      Right Left   Dist [place] 20/30 -1 20/40 -2   Dist ph [place]  20/30 -1       Tonometry (Tonopen, 8:27 AM)      Right Left   Pressure 17 14       Pupils      Pupils Dark Light Shape React APD   Right PERRL 4 3 Round Slow None   Left PERRL 4 3 Round Slow None       Visual Fields (Counting fingers)      Left Right    Full Full       Neuro/Psych    Oriented x3: Yes   Mood/Affect: Normal       Dilation    Left eye: 1.0% Mydriacyl, 2.5% Phenylephrine @ 8:27 AM        Slit Lamp and Fundus Exam    External Exam      Right Left   External Normal Normal       Slit Lamp Exam      Right Left   Lids/Lashes Normal Normal   Conjunctiva/Sclera White and quiet White and quiet   Cornea Clear Clear   Anterior Chamber Deep and quiet Deep and quiet    Iris Round and reactive Round and reactive   Lens 2+ Nuclear sclerosis 1+ Nuclear sclerosis   Anterior Vitreous Normal Normal       Fundus Exam      Right Left   Posterior Vitreous  Normal   Disc  Normal   C/D Ratio  0.45   Macula  Microaneurysms, no macular thickening   Vessels  NPDR-Severe   Periphery  Normal          IMAGING AND PROCEDURES  Imaging and Procedures for 06/08/20  OCT, Retina - OU - Both Eyes       Right Eye Quality was good. Scan locations included subfoveal. Central Foveal Thickness: 273. Progression has improved.   Left Eye Quality was good. Scan locations included subfoveal. Central Foveal Thickness: 279. Progression has improved.   Notes OD, CSME temporal to the fovea, improved.  OS CSME has stabilized and is improved and is resolved                ASSESSMENT/PLAN:  Diabetic macular edema of right eye with proliferative retinopathy associated with type 2 diabetes mellitus (HCC) CSME OD has continued a slow steady improvement.  Now status post PRP.  Will observe  Proliferative diabetic retinopathy of left eye with macular edema associated with type 2 diabetes mellitus (HCC) CSME OS has also resolved.  Continue to monitor.  Follow-up in 3 months      ICD-10-CM   1. Proliferative diabetic retinopathy of left eye without macular edema associated with type 2 diabetes mellitus (HCC)  M09.4709 OCT, Retina - OU - Both Eyes  2. Diabetic macular edema of right eye with proliferative retinopathy associated with type 2 diabetes mellitus ([place])  E11.3511   3. Proliferative diabetic retinopathy of left eye with macular edema associated with type 2 diabetes mellitus ([place])  G28.3662     1.  I emphasized the importance of continued blood sugar control and monitoring  2.  Will observe OU for now.  3.  Ophthalmic Meds Ordered this visit:  No orders of the defined types were placed in this encounter.      Return in about 3 months (around  09/07/2020) for DILATE OU, COLOR FP, OCT.  There are no Patient Instructions on file for this visit.   Explained the diagnoses, plan, and follow up with the patient and they expressed understanding.  Patient expressed understanding of the importance of proper follow up care.   Clent Demark Tiffine Henigan M.D. Diseases & Surgery of the Retina and Vitreous Retina & Diabetic [place] 06/08/20     Abbreviations: M myopia (nearsighted); A astigmatism; H hyperopia (farsighted); P presbyopia; Mrx spectacle prescription;  CTL contact lenses; OD right eye; OS left eye; OU both eyes  XT exotropia; ET esotropia; PEK punctate epithelial keratitis; PEE punctate epithelial erosions; DES dry eye syndrome; MGD meibomian gland dysfunction; ATs artificial tears; PFAT's preservative free artificial tears; [place] nuclear sclerotic cataract; PSC posterior subcapsular cataract; ERM epi-retinal membrane; PVD posterior vitreous detachment; RD retinal detachment; DM diabetes mellitus; DR diabetic retinopathy; NPDR non-proliferative diabetic retinopathy; PDR proliferative diabetic retinopathy; CSME clinically significant macular edema; DME diabetic macular edema; dbh dot blot hemorrhages; CWS cotton wool spot; POAG primary open angle glaucoma; C/D cup-to-disc ratio; HVF humphrey visual field; GVF goldmann visual field; OCT optical coherence tomography; IOP intraocular pressure; BRVO Branch retinal vein occlusion; CRVO central retinal vein occlusion; CRAO central retinal artery occlusion; BRAO branch retinal artery occlusion; RT retinal tear; SB scleral buckle; PPV pars plana vitrectomy; VH Vitreous hemorrhage; PRP panretinal laser photocoagulation; IVK intravitreal kenalog; VMT vitreomacular traction; MH Macular hole;  NVD neovascularization of the disc; NVE neovascularization elsewhere; AREDS age related eye disease study; ARMD age related macular degeneration; POAG primary open angle glaucoma; EBMD epithelial/anterior basement membrane  dystrophy; ACIOL anterior chamber intraocular lens; IOL intraocular lens; PCIOL posterior chamber intraocular lens; Phaco/IOL phacoemulsification with intraocular lens placement; [place] photorefractive keratectomy; LASIK laser assisted in situ keratomileusis; HTN hypertension; DM diabetes mellitus; COPD chronic obstructive pulmonary disease

## 2020-06-09 ENCOUNTER — Other Ambulatory Visit: Payer: Self-pay | Admitting: Nephrology

## 2020-06-09 DIAGNOSIS — N1831 Chronic kidney disease, stage 3a: Secondary | ICD-10-CM

## 2020-06-16 ENCOUNTER — Encounter (INDEPENDENT_AMBULATORY_CARE_PROVIDER_SITE_OTHER): Payer: Self-pay | Admitting: Ophthalmology

## 2020-06-16 ENCOUNTER — Ambulatory Visit (INDEPENDENT_AMBULATORY_CARE_PROVIDER_SITE_OTHER): Payer: Medicare Other | Admitting: Ophthalmology

## 2020-06-16 ENCOUNTER — Other Ambulatory Visit: Payer: Self-pay

## 2020-06-16 DIAGNOSIS — E113511 Type 2 diabetes mellitus with proliferative diabetic retinopathy with macular edema, right eye: Secondary | ICD-10-CM

## 2020-06-16 MED ORDER — BEVACIZUMAB CHEMO INJECTION 1.25MG/0.05ML SYRINGE FOR KALEIDOSCOPE
1.2500 mg | INTRAVITREAL | Status: AC | PRN
  Administered 2020-06-16: 1.25 mg via INTRAVITREAL

## 2020-06-16 NOTE — Progress Notes (Signed)
06/16/2020     CHIEF COMPLAINT Patient presents for Retina Follow Up   HISTORY OF PRESENT ILLNESS: Andrew Pruitt is a 64 y.o. male who presents to the clinic today for:   HPI    Retina Follow Up    Patient presents with  Diabetic Retinopathy.  In right eye.  Severity is moderate.  Duration of 8 weeks.  Since onset it is stable.  I, the attending physician,  performed the HPI with the patient and updated documentation appropriately.          Comments    8 Week f\u OD. Possible Avastin OD. OCT  Pt states vision is stable. Denies any complaints. BGL: 219 currently       Last edited by Tilda Franco on 06/16/2020  8:25 AM. (History)      Referring physician: Brunetta Jeans, PA-C [address] [place],  [place] [address]  HISTORICAL INFORMATION:   Selected notes from the MEDICAL RECORD NUMBER    Lab Results  Component Value Date   HGBA1C 11.7 (A) 05/18/2020     CURRENT MEDICATIONS: No current outpatient medications on file. (Ophthalmic Drugs)   No current facility-administered medications for this visit. (Ophthalmic Drugs)   Current Outpatient Medications (Other)  Medication Sig   benzonatate (TESSALON) 200 MG capsule Take 1 capsule (200 mg total) by mouth every 8 (eight) hours.   Continuous Blood Gluc Receiver (FREESTYLE LIBRE 14 DAY READER) DEVI Use to check glucose levels TID; E11.9   Continuous Blood Gluc Sensor (FREESTYLE LIBRE 14 DAY SENSOR) MISC Use to check glucose TID. Change every 14 days. E 11.9   Dulaglutide (TRULICITY) 1.5 VE/7.2CN SOPN Inject 0.5 mLs (1.5 mg total) into the skin once a week.   Insulin NPH, Human,, Isophane, (NOVOLIN N FLEXPEN) 100 UNIT/ML Kiwkpen Inject 80 Units into the skin every morning.   Insulin Pen Needle (B-D UF III MINI PEN NEEDLES) 31G X 5 MM MISC 1 each by Other route daily. E11.9   rosuvastatin (CRESTOR) 40 MG tablet TAKE 1 TABLET BY MOUTH EVERY DAY   valsartan (DIOVAN) 80 MG tablet Take 1 tablet (80 mg  total) by mouth daily.   No current facility-administered medications for this visit. (Other)      REVIEW OF SYSTEMS: ROS    Positive for: Endocrine   Last edited by Tilda Franco on 06/16/2020  8:25 AM. (History)       ALLERGIES Allergies  Allergen Reactions   Niaspan [Niacin] Other (See Comments)    Red skin, red eyes    PAST MEDICAL HISTORY Past Medical History:  Diagnosis Date   Arthritis    Depression    Diabetes mellitus without complication ([place])    Hyperlipidemia    Hypertension    Past Surgical History:  Procedure Laterality Date   TONSILLECTOMY      FAMILY HISTORY Family History  Problem Relation Age of Onset   Cancer Mother        Uterine    Early death Mother    Stroke Mother    Diabetes Father    Heart attack Father    Heart disease Father    Hyperlipidemia Father     SOCIAL HISTORY Social History   Tobacco Use   Smoking status: Never Smoker   Smokeless tobacco: Never Used  Vaping Use   Vaping Use: Never used  Substance Use Topics   Alcohol use: Not Currently   Drug use: Never  OPHTHALMIC EXAM: Base Eye Exam    Visual Acuity (Snellen - Linear)      Right Left   Dist [place] 20/30 + 20/30 -1       Tonometry (Tonopen, 8:29 AM)      Right Left   Pressure 14 17       Pupils      Pupils Dark Light Shape React APD   Right PERRL 4 3 Round Sluggish None   Left PERRL 4 3 Round Sluggish None       Neuro/Psych    Oriented x3: Yes   Mood/Affect: Normal       Dilation    Right eye: 1.0% Mydriacyl, 2.5% Phenylephrine @ 8:29 AM        Slit Lamp and Fundus Exam    External Exam      Right Left   External Normal Normal       Slit Lamp Exam      Right Left   Lids/Lashes Normal Normal   Conjunctiva/Sclera White and quiet White and quiet   Cornea Clear Clear   Anterior Chamber Deep and quiet Deep and quiet   Iris Round and reactive Round and reactive   Lens 2+ Nuclear sclerosis 1+ Nuclear  sclerosis   Anterior Vitreous Normal Normal       Fundus Exam      Right Left   Posterior Vitreous Normal    Disc Normal    C/D Ratio 0.3    Macula Microaneurysms,  macular thickening, with hard exudates.    Vessels NPDR-Severe    Periphery Normal           IMAGING AND PROCEDURES  Imaging and Procedures for 06/16/20  OCT, Retina - OU - Both Eyes       Right Eye Quality was good. Scan locations included subfoveal. Central Foveal Thickness: 278. Progression has improved.   Left Eye Quality was good. Scan locations included subfoveal. Central Foveal Thickness: 281. Progression has improved.   Notes OD, still active CSME, will proceed with retinal Avastin OD today and examination OU in 8 weeks       Intravitreal Injection, Pharmacologic Agent - OD - Right Eye       Time Out 06/16/2020. 9:12 AM. Confirmed correct patient, procedure, site, and patient consented.   Anesthesia Topical anesthesia was used. Anesthetic medications included [place] 3.5%.   Procedure Preparation included Tobramycin 0.3%, 10% betadine to eyelids, 5% betadine to ocular surface. A 30 gauge needle was used.   Injection:  1.25 mg Bevacizumab (AVASTIN) SOLN   NDC: 70360-001-02   Route: Intravitreal, Site: Right Eye, Waste: 0 mg  Post-op Post injection exam found visual acuity of at least counting fingers. The patient tolerated the procedure well. There were no complications. The patient received written and verbal post procedure care education. Post injection medications were not given.                 ASSESSMENT/PLAN:  No problem-specific Assessment & Plan notes found for this encounter.      ICD-10-CM   1. Diabetic macular edema of right eye with proliferative retinopathy associated with type 2 diabetes mellitus (HCC)  E11.3511 OCT, Retina - OU - Both Eyes    Intravitreal Injection, Pharmacologic Agent - OD - Right Eye    Bevacizumab (AVASTIN) SOLN 1.25 mg    1.  CSME OD does  persist, will treat today with intravitreal Avastin and dilated OU in 8 weeks  2.  3.  Ophthalmic  Meds Ordered this visit:  Meds ordered this encounter  Medications   Bevacizumab (AVASTIN) SOLN 1.25 mg       Return in about 8 weeks (around 08/11/2020) for DILATE OU, OCT, COLOR FP.  There are no Patient Instructions on file for this visit.   Explained the diagnoses, plan, and follow up with the patient and they expressed understanding.  Patient expressed understanding of the importance of proper follow up care.   Clent Demark Lastacia Solum M.D. Diseases & Surgery of the Retina and Vitreous Retina & Diabetic [place] 06/16/20     Abbreviations: M myopia (nearsighted); A astigmatism; H hyperopia (farsighted); P presbyopia; Mrx spectacle prescription;  CTL contact lenses; OD right eye; OS left eye; OU both eyes  XT exotropia; ET esotropia; PEK punctate epithelial keratitis; PEE punctate epithelial erosions; DES dry eye syndrome; MGD meibomian gland dysfunction; ATs artificial tears; PFAT's preservative free artificial tears; [place] nuclear sclerotic cataract; PSC posterior subcapsular cataract; ERM epi-retinal membrane; PVD posterior vitreous detachment; RD retinal detachment; DM diabetes mellitus; DR diabetic retinopathy; NPDR non-proliferative diabetic retinopathy; PDR proliferative diabetic retinopathy; CSME clinically significant macular edema; DME diabetic macular edema; dbh dot blot hemorrhages; CWS cotton wool spot; POAG primary open angle glaucoma; C/D cup-to-disc ratio; HVF humphrey visual field; GVF goldmann visual field; OCT optical coherence tomography; IOP intraocular pressure; BRVO Branch retinal vein occlusion; CRVO central retinal vein occlusion; CRAO central retinal artery occlusion; BRAO branch retinal artery occlusion; RT retinal tear; SB scleral buckle; PPV pars plana vitrectomy; VH Vitreous hemorrhage; PRP panretinal laser photocoagulation; IVK intravitreal kenalog; VMT  vitreomacular traction; MH Macular hole;  NVD neovascularization of the disc; NVE neovascularization elsewhere; AREDS age related eye disease study; ARMD age related macular degeneration; POAG primary open angle glaucoma; EBMD epithelial/anterior basement membrane dystrophy; ACIOL anterior chamber intraocular lens; IOL intraocular lens; PCIOL posterior chamber intraocular lens; Phaco/IOL phacoemulsification with intraocular lens placement; [place] photorefractive keratectomy; LASIK laser assisted in situ keratomileusis; HTN hypertension; DM diabetes mellitus; COPD chronic obstructive pulmonary disease

## 2020-06-21 ENCOUNTER — Other Ambulatory Visit: Payer: Self-pay | Admitting: Physician Assistant

## 2020-06-21 DIAGNOSIS — E1159 Type 2 diabetes mellitus with other circulatory complications: Secondary | ICD-10-CM

## 2020-06-21 DIAGNOSIS — I152 Hypertension secondary to endocrine disorders: Secondary | ICD-10-CM

## 2020-06-22 ENCOUNTER — Ambulatory Visit
Admission: RE | Admit: 2020-06-22 | Discharge: 2020-06-22 | Disposition: A | Discharge status: Home / Self Care | Payer: Medicare Other | Source: Ambulatory Visit | Attending: Nephrology | Admitting: Nephrology

## 2020-06-22 DIAGNOSIS — N1831 Chronic kidney disease, stage 3a: Secondary | ICD-10-CM

## 2020-06-22 IMAGING — US US RENAL
1 series · 14 of 25 positions shown · non-contrast
Comparison: None.

CLINICAL DATA: CKD

EXAM:
RENAL / URINARY TRACT ULTRASOUND COMPLETE

[Series 1: us renal · 0.23mm/px · 14 of 46 slices shown]
[im 1/46]
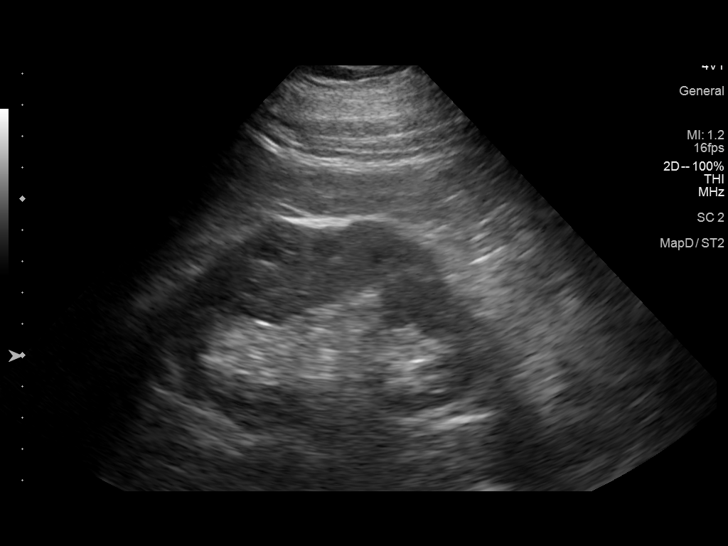
[im 4/46]
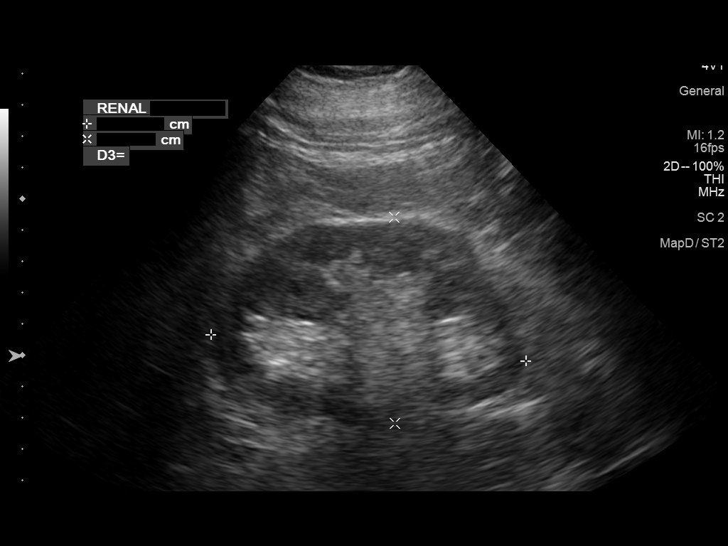
[im 8/46]
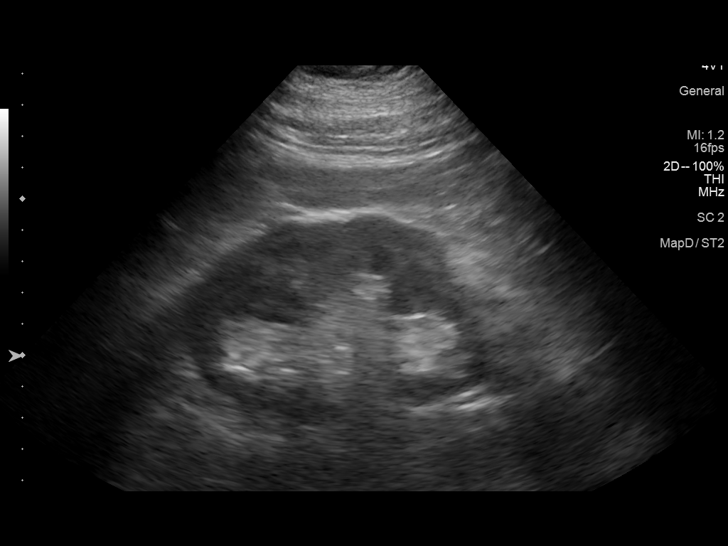
[im 12/46]
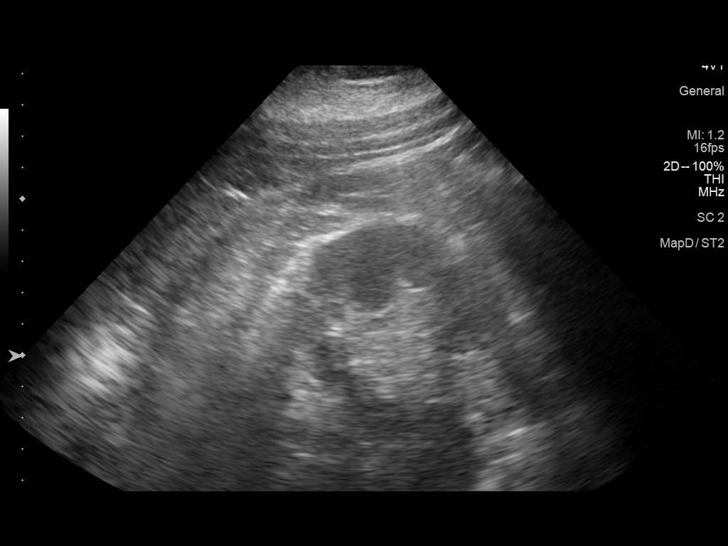
[im 16/46]
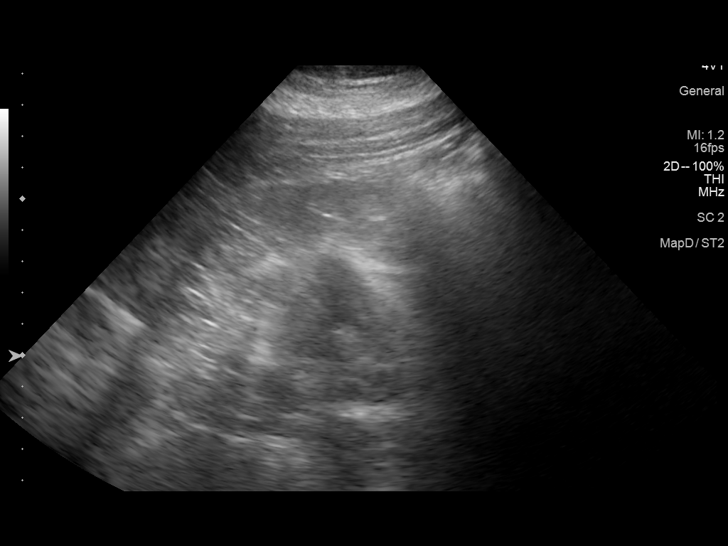
[im 17/46]
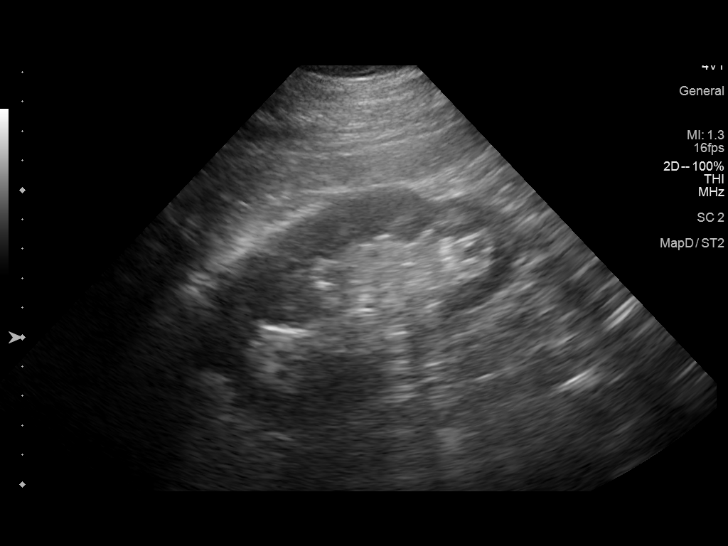
[im 21/46]
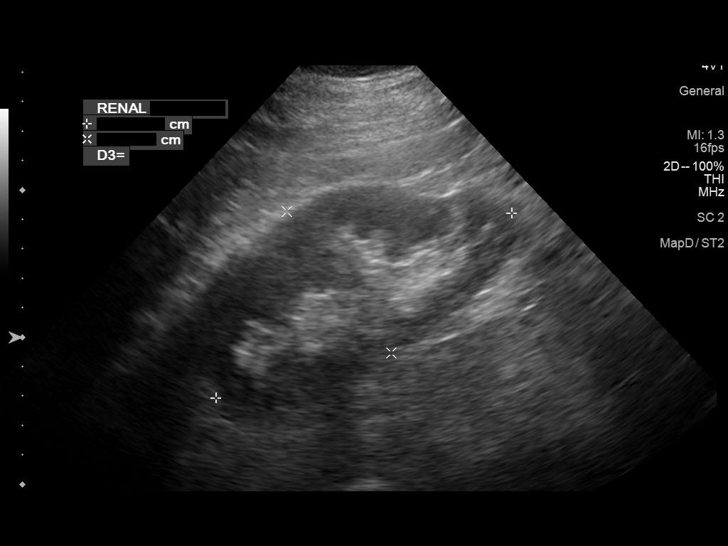
[im 25/46]
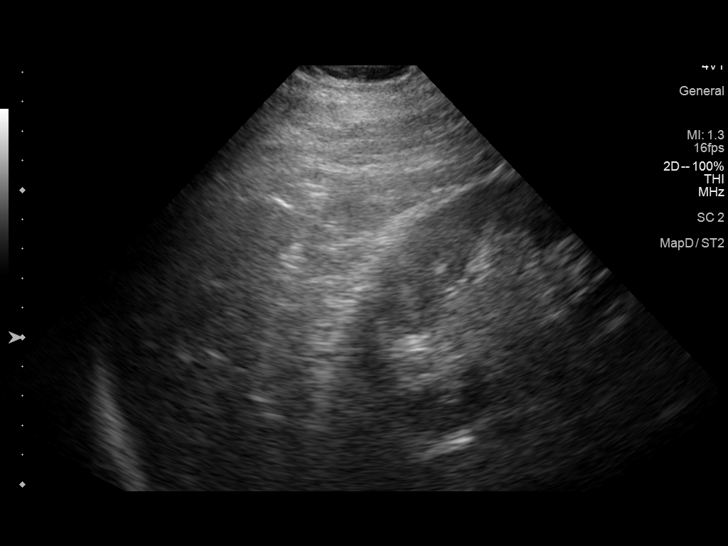
[im 29/46]
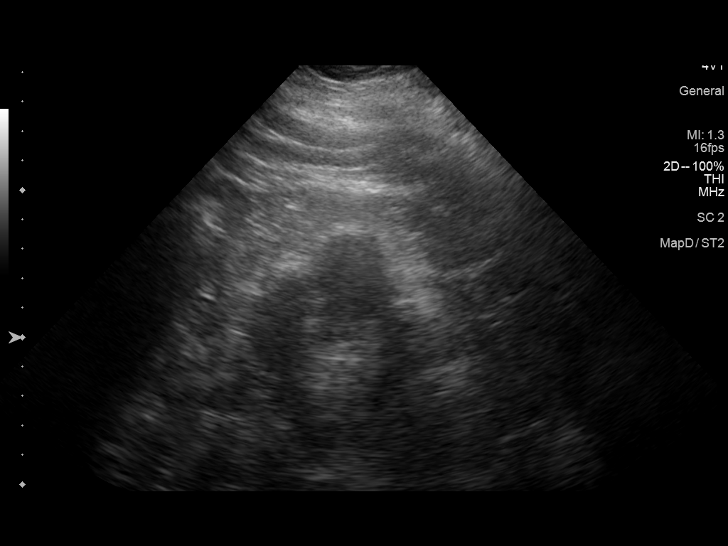
[im 31/46]
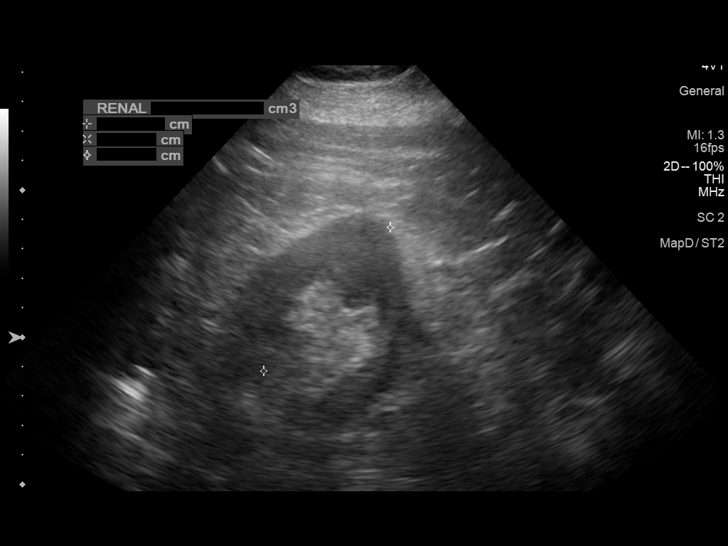
[im 34/46]
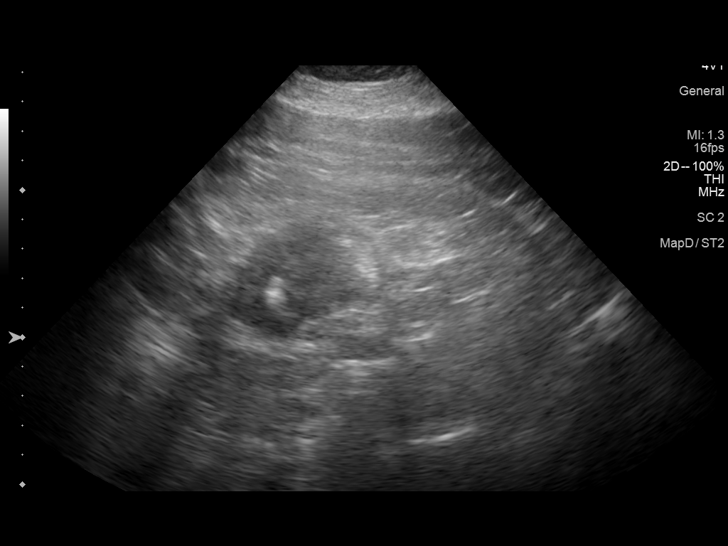
[im 38/46]
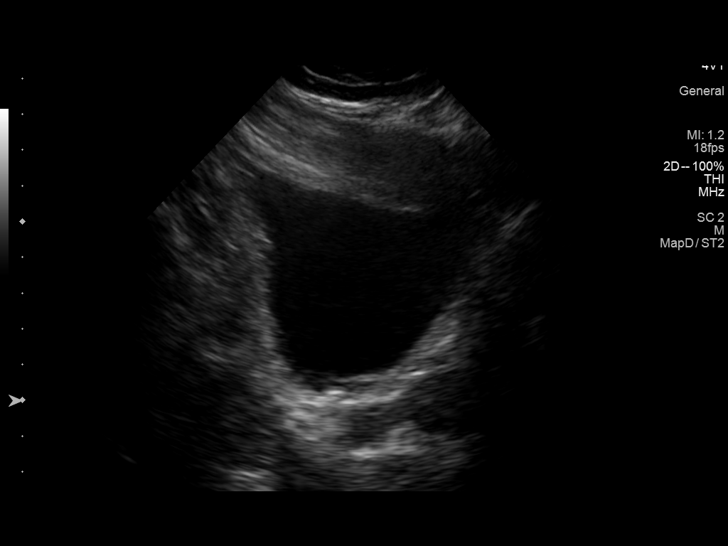
[im 42/46]
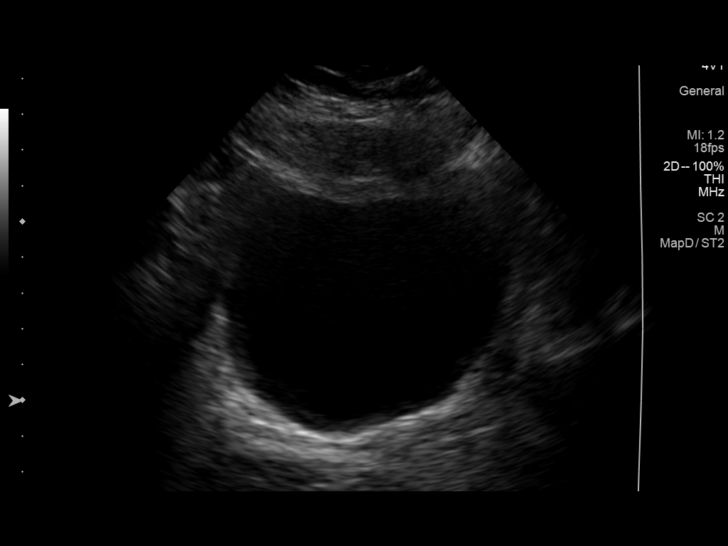
[im 46/46]
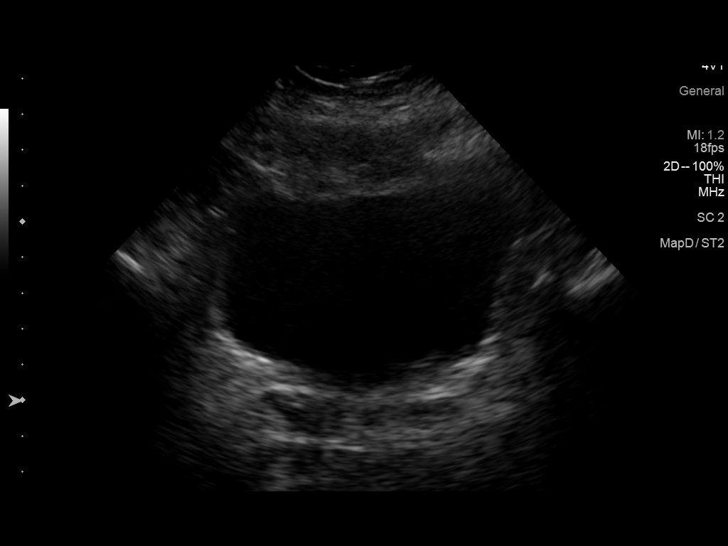

[14 of 25 positions shown; findings below may reference images not displayed]

FINDINGS: Right Kidney:

Renal measurements: 10.1 x 6.6 x 6.2 cm = volume: 217 mL.
Echogenicity within normal limits. No mass or hydronephrosis
visualized.

Left Kidney:

Renal measurements: 11.9 x 6.0 x 6.5 cm = volume: 241 mL.
Echogenicity within normal limits. No mass or hydronephrosis
visualized.

Bladder:

Appears normal for degree of bladder distention.
IMPRESSION: No hydronephrosis.

## 2020-06-24 ENCOUNTER — Other Ambulatory Visit: Payer: Self-pay

## 2020-06-24 ENCOUNTER — Encounter: Payer: Self-pay | Admitting: Physician Assistant

## 2020-06-24 ENCOUNTER — Ambulatory Visit (INDEPENDENT_AMBULATORY_CARE_PROVIDER_SITE_OTHER): Payer: Medicare Other | Admitting: Physician Assistant

## 2020-06-24 VITALS — BP 170/98 | HR 78 | Temp 97.7°F | Resp 17 | Ht 70.0 in | Wt 267.6 lb

## 2020-06-24 DIAGNOSIS — E1169 Type 2 diabetes mellitus with other specified complication: Secondary | ICD-10-CM | POA: Diagnosis not present

## 2020-06-24 DIAGNOSIS — E785 Hyperlipidemia, unspecified: Secondary | ICD-10-CM

## 2020-06-24 DIAGNOSIS — E1159 Type 2 diabetes mellitus with other circulatory complications: Secondary | ICD-10-CM

## 2020-06-24 DIAGNOSIS — I152 Hypertension secondary to endocrine disorders: Secondary | ICD-10-CM

## 2020-06-24 DIAGNOSIS — R053 Chronic cough: Secondary | ICD-10-CM

## 2020-06-24 DIAGNOSIS — Z23 Encounter for immunization: Secondary | ICD-10-CM | POA: Diagnosis not present

## 2020-06-24 DIAGNOSIS — E1122 Type 2 diabetes mellitus with diabetic chronic kidney disease: Secondary | ICD-10-CM

## 2020-06-24 DIAGNOSIS — E1140 Type 2 diabetes mellitus with diabetic neuropathy, unspecified: Secondary | ICD-10-CM

## 2020-06-24 MED ORDER — HYDROCHLOROTHIAZIDE 25 MG PO TABS: 25.0000 mg | ORAL_TABLET | Freq: Every day | ORAL | 1 refills | Status: DC

## 2020-06-24 MED ORDER — AMLODIPINE BESYLATE 2.5 MG PO TABS: 2.5000 mg | ORAL_TABLET | Freq: Every day | ORAL | 3 refills | Status: DC

## 2020-06-24 NOTE — Patient Instructions (Signed)
Please go to our [place] office at [place] for chest x-ray. You can go M-F 8-5. You do not need an appointment for this since I have put an order in.  I will call with results.  Follow dietary recommendations below in hopes it will help with cough. Stop the Valsartan.  I am putting you on a combination of amlodipine and hctz for the next 2 weeks.   If x-ray negative and cough not improving after cessation of ARB  (Valsartan), I will need you to have follow-up with a specialist for further evaluation.  Follow-up 2 weeks for reassessment of blood pressure and cough.

## 2020-06-24 NOTE — Progress Notes (Signed)
Patient presents to clinic today for hypertension follow-up as well as follow-up regarding chronic cough.   Patient with ongoing dry cough felt initially to be secondary to mild GERD which was treated without resolution of cough. Patient was concerned cough was being caused by losartan as he had similar issue with ACEI in the past. We attempted first to switch patient to a different ARB to see if this would mitigate symptoms while still giving renovascular protection from his DM. Is taking the Valsartan as directed but denies any major change in cough. Would like trial off of medication. Patient denies fever, chills, chest congestion. Had COVID a few months ago but cough has been present for longer than this. Denies wheezing, chest tightness or SOB. Denies PND, heartburn or indigestion. Patient has never been a smoker.   Past Medical History:  Diagnosis Date  . Arthritis   . Depression   . Diabetes mellitus without complication ([place])   . Hyperlipidemia   . Hypertension     Current Outpatient Medications on File Prior to Visit  Medication Sig Dispense Refill  . benzonatate (TESSALON) 200 MG capsule Take 1 capsule (200 mg total) by mouth every 8 (eight) hours. 21 capsule 0  . Continuous Blood Gluc Receiver (FREESTYLE LIBRE 14 DAY READER) DEVI Use to check glucose levels TID; E11.9 1 each 0  . Continuous Blood Gluc Sensor (FREESTYLE LIBRE 14 DAY SENSOR) MISC Use to check glucose TID. Change every 14 days. E 11.9 6 each 3  . Dulaglutide (TRULICITY) 1.5 WK/4.6KM SOPN Inject 0.5 mLs (1.5 mg total) into the skin once a week. 6 mL 3  . Insulin NPH, Human,, Isophane, (NOVOLIN N FLEXPEN) 100 UNIT/ML Kiwkpen Inject 80 Units into the skin every morning. 30 mL 11  . Insulin Pen Needle (B-D UF III MINI PEN NEEDLES) 31G X 5 MM MISC 1 each by Other route daily. E11.9 90 each 0  . valsartan (DIOVAN) 80 MG tablet TAKE 1 TABLET BY MOUTH EVERY DAY 30 tablet 1  . rosuvastatin (CRESTOR) 40 MG tablet TAKE 1  TABLET BY MOUTH EVERY DAY (Patient not taking: Reported on 06/24/2020) 90 tablet 1   No current facility-administered medications on file prior to visit.    Allergies  Allergen Reactions  . Niaspan [Niacin] Other (See Comments)    Red skin, red eyes    Family History  Problem Relation Age of Onset  . Cancer Mother        Uterine   . Early death Mother   . Stroke Mother   . Diabetes Father   . Heart attack Father   . Heart disease Father   . Hyperlipidemia Father     Social History   Socioeconomic History  . Marital status: Married    Spouse name: Not on file  . Number of children: Not on file  . Years of education: Not on file  . Highest education level: Not on file  Occupational History  . Not on file  Tobacco Use  . Smoking status: Never Smoker  . Smokeless tobacco: Never Used  Vaping Use  . Vaping Use: Never used  Substance and Sexual Activity  . Alcohol use: Not Currently  . Drug use: Never  . Sexual activity: Yes  Other Topics Concern  . Not on file  Social History Narrative  . Not on file   Social Determinants of Health   Financial Resource Strain:   . Difficulty of Paying Living Expenses: Not on file  Food  Insecurity:   . Worried About Charity fundraiser in the Last Year: Not on file  . Ran Out of Food in the Last Year: Not on file  Transportation Needs:   . Lack of Transportation (Medical): Not on file  . Lack of Transportation (Non-Medical): Not on file  Physical Activity:   . Days of Exercise per Week: Not on file  . Minutes of Exercise per Session: Not on file  Stress:   . Feeling of Stress : Not on file  Social Connections:   . Frequency of Communication with Friends and Family: Not on file  . Frequency of Social Gatherings with Friends and Family: Not on file  . Attends Religious Services: Not on file  . Active Member of Clubs or Organizations: Not on file  . Attends Archivist Meetings: Not on file  . Marital Status: Not on  file    Review of Systems - See HPI.  All other ROS are negative.  BP (!) 170/98   Pulse 78   Temp 97.7 F (36.5 C) (Temporal)   Resp 17   Ht 5\' 10"  (1.778 m)   Wt 267 lb 9.6 oz (121.4 kg)   SpO2 98%   BMI 38.40 kg/m   Physical Exam Vitals reviewed.  Constitutional:      Appearance: Normal appearance.  HENT:     Head: Normocephalic and atraumatic.     Right Ear: Tympanic membrane normal.     Left Ear: Tympanic membrane normal.  Eyes:     Conjunctiva/sclera: Conjunctivae normal.     Pupils: Pupils are equal, round, and reactive to light.  Cardiovascular:     Rate and Rhythm: Normal rate and regular rhythm.     Pulses: Normal pulses.     Heart sounds: Normal heart sounds.  Pulmonary:     Effort: Pulmonary effort is normal.     Breath sounds: Normal breath sounds.  Musculoskeletal:     Cervical back: Neck supple.  Neurological:     General: No focal deficit present.     Mental Status: He is alert and oriented to person, place, and time.  Psychiatric:        Mood and Affect: Mood normal.     Recent Results (from the past 2160 hour(s))  POCT HgB A1C     Status: Abnormal   Collection Time: 04/08/20  8:50 AM  Result Value Ref Range   Hemoglobin A1C 12.6 (A) 4.0 - 5.6 %   HbA1c POC (<> result, manual entry)     HbA1c, POC (prediabetic range)     HbA1c, POC (controlled diabetic range)    Cologuard     Status: Abnormal   Collection Time: 04/26/20 12:00 AM  Result Value Ref Range   Cologuard Positive (A) Negative  POCT HgB A1C     Status: Abnormal   Collection Time: 05/18/20  8:40 AM  Result Value Ref Range   Hemoglobin A1C 11.7 (A) 4.0 - 5.6 %   HbA1c POC (<> result, manual entry)     HbA1c, POC (prediabetic range)     HbA1c, POC (controlled diabetic range)    Novel Coronavirus, NAA ([facility])     Status: Abnormal   Collection Time: 05/23/20  6:09 PM   Specimen: Nasopharyngeal(NP) swabs in vial transport medium   Nasopharynge  Patient  Result Value Ref Range    SARS-CoV-2, NAA Detected (A) Not Detected    Comment: Patients who have a positive COVID-19 test result may now have  treatment options. Treatment options are available for patients with mild to moderate symptoms and for hospitalized patients. Visit our website at [email or web address] for resources and information. This nucleic acid amplification test was developed and its performance characteristics determined by [employer]. Nucleic acid amplification tests include RT-PCR and TMA. This test has not been FDA cleared or approved. This test has been authorized by FDA under an Emergency Use Authorization (EUA). This test is only authorized for the duration of time the declaration that circumstances exist justifying the authorization of the emergency use of in vitro diagnostic tests for detection of SARS-CoV-2 virus and/or diagnosis of COVID-19 infection under section 564(b)(1) of the Act, 21 U.S.C. 962IWL-7(L) (1), unless the authorization is terminated or revoked sooner. When diagnostic testing is negativ e, the possibility of a false negative result should be considered in the context of a patient's recent exposures and the presence of clinical signs and symptoms consistent with COVID-19. An individual without symptoms of COVID-19 and who is not shedding SARS-CoV-2 virus would expect to have a negative (not detected) result in this assay.     Assessment/Plan: 1. Hypertension associated with diabetes ([place]) BP above goal. Will stop ARB altogether giving ongoing cough to see if related. Will start patient on combination of amlodipine and hydrochlorothiazide. DASH diet. He is to keep a check of BP daily at home and record. Bring to follow-up in 2 weeks.   2. Chronic cough Will stop ARB completely for now. Examination unremarkable. Will proceed with CXR. If negative and cough not resolving with discontinuation of ARB, will need Pulmonology evaluation.  - DG Chest 2 View;  Future  4. Need for immunization against influenza - Flu Vaccine QUAD High Dose(Fluad)  This visit occurred during the SARS-CoV-2 public health emergency.  Safety protocols were in place, including screening questions prior to the visit, additional usage of staff PPE, and extensive cleaning of exam room while observing appropriate contact time as indicated for disinfecting solutions.     Leeanne Rio, PA-C

## 2020-06-30 ENCOUNTER — Ambulatory Visit
Admission: RE | Admit: 2020-06-30 | Discharge: 2020-06-30 | Disposition: A | Discharge status: Home / Self Care | Payer: Medicare Other | Source: Ambulatory Visit | Attending: Physician Assistant | Admitting: Physician Assistant

## 2020-06-30 ENCOUNTER — Other Ambulatory Visit: Payer: Self-pay

## 2020-06-30 ENCOUNTER — Other Ambulatory Visit: Payer: Self-pay | Admitting: Physician Assistant

## 2020-06-30 ENCOUNTER — Ambulatory Visit (INDEPENDENT_AMBULATORY_CARE_PROVIDER_SITE_OTHER): Payer: Medicare Other | Admitting: Endocrinology

## 2020-06-30 VITALS — BP 160/90 | HR 81 | Ht 70.0 in | Wt 266.0 lb

## 2020-06-30 DIAGNOSIS — Z794 Long term (current) use of insulin: Secondary | ICD-10-CM

## 2020-06-30 DIAGNOSIS — R053 Chronic cough: Secondary | ICD-10-CM

## 2020-06-30 DIAGNOSIS — E1140 Type 2 diabetes mellitus with diabetic neuropathy, unspecified: Secondary | ICD-10-CM | POA: Diagnosis not present

## 2020-06-30 LAB — POCT GLYCOSYLATED HEMOGLOBIN (HGB A1C): Hemoglobin A1C: 11.7 % — AB (ref 4.0–5.6)

## 2020-06-30 IMAGING — DX DG CHEST 2V
2 series · 2 of 2 positions shown · non-contrast
Comparison: None.

CLINICAL DATA: Chronic cough

EXAM:
CHEST - 2 VIEW

[dg chest 2 view (1 of 2)]
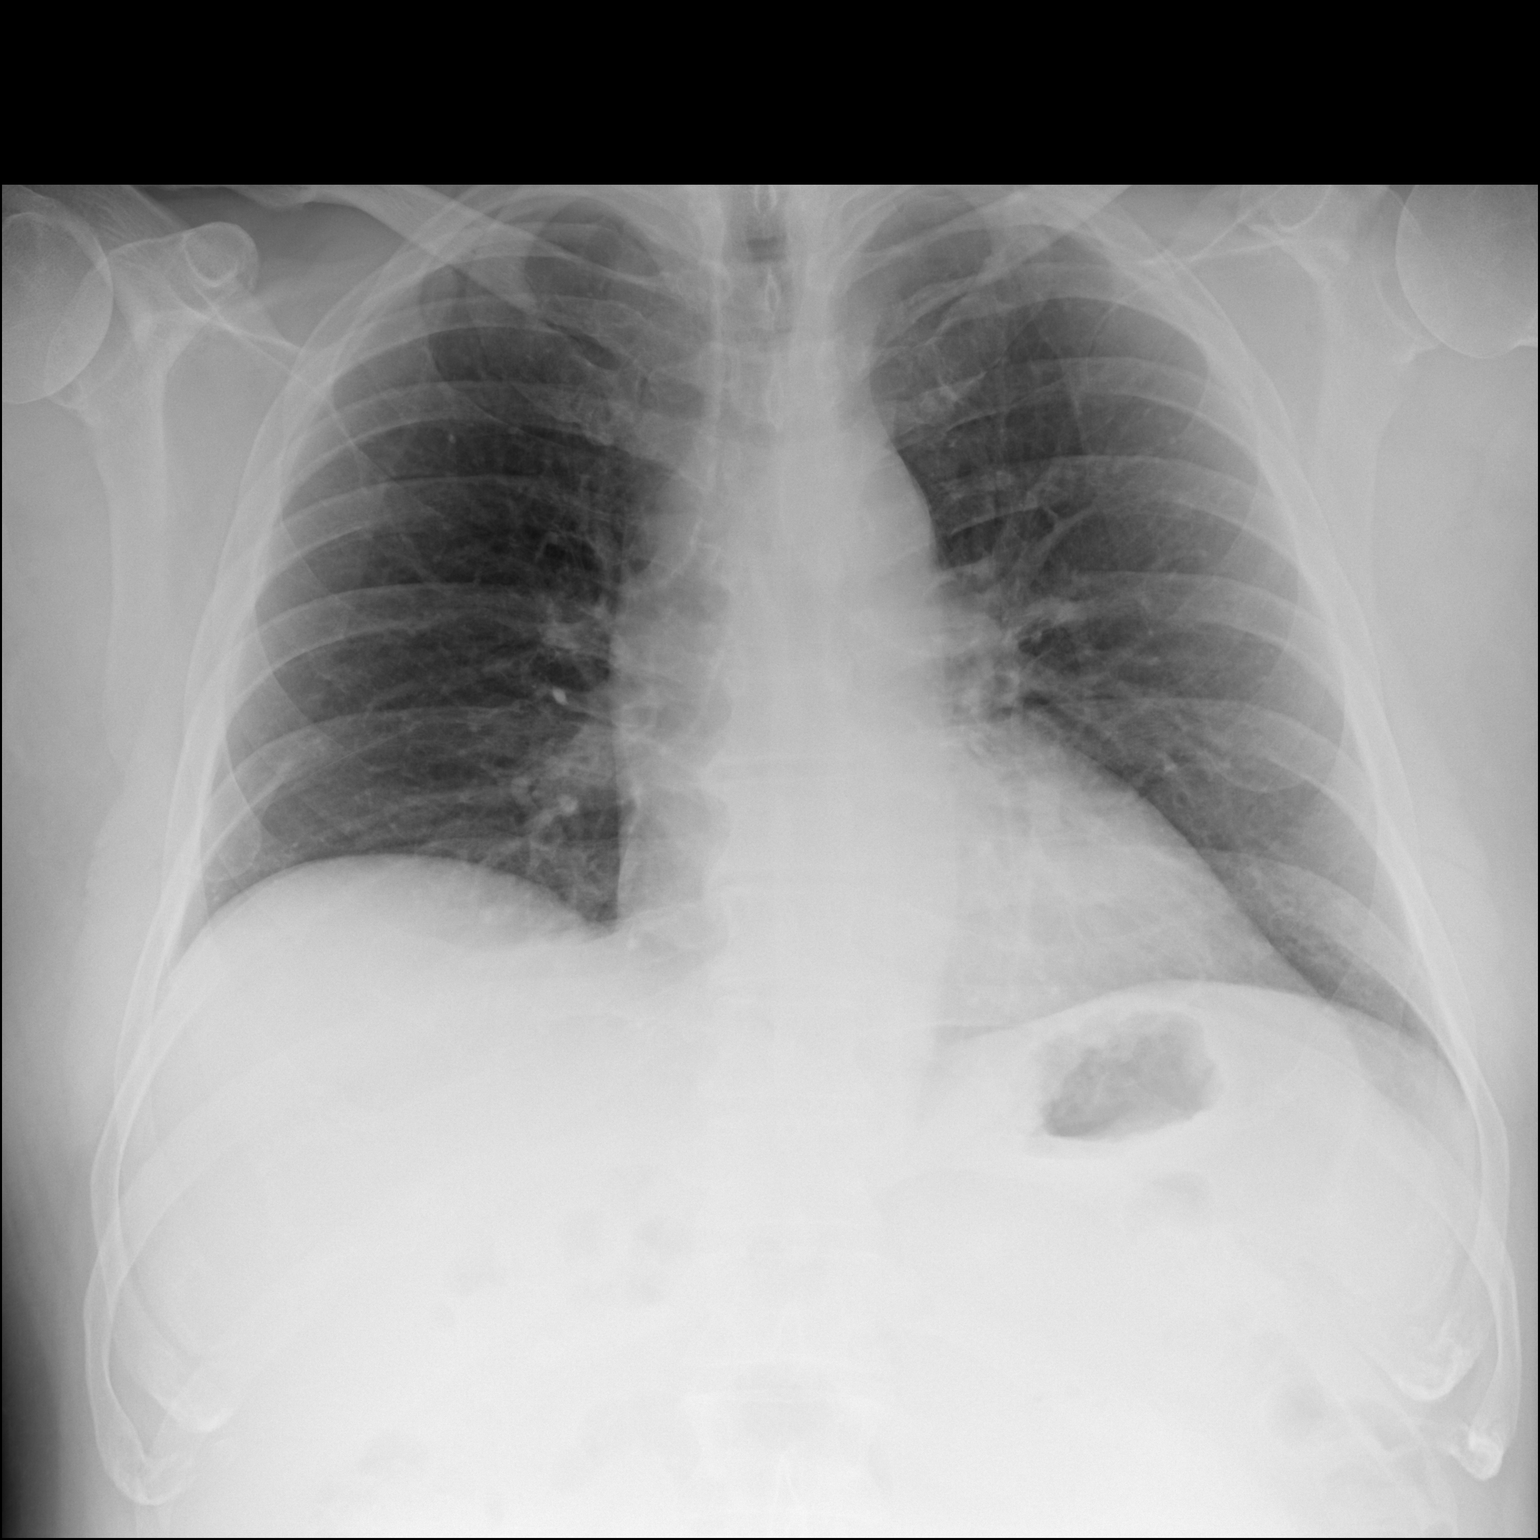

[dg chest 2 view (2 of 2)]
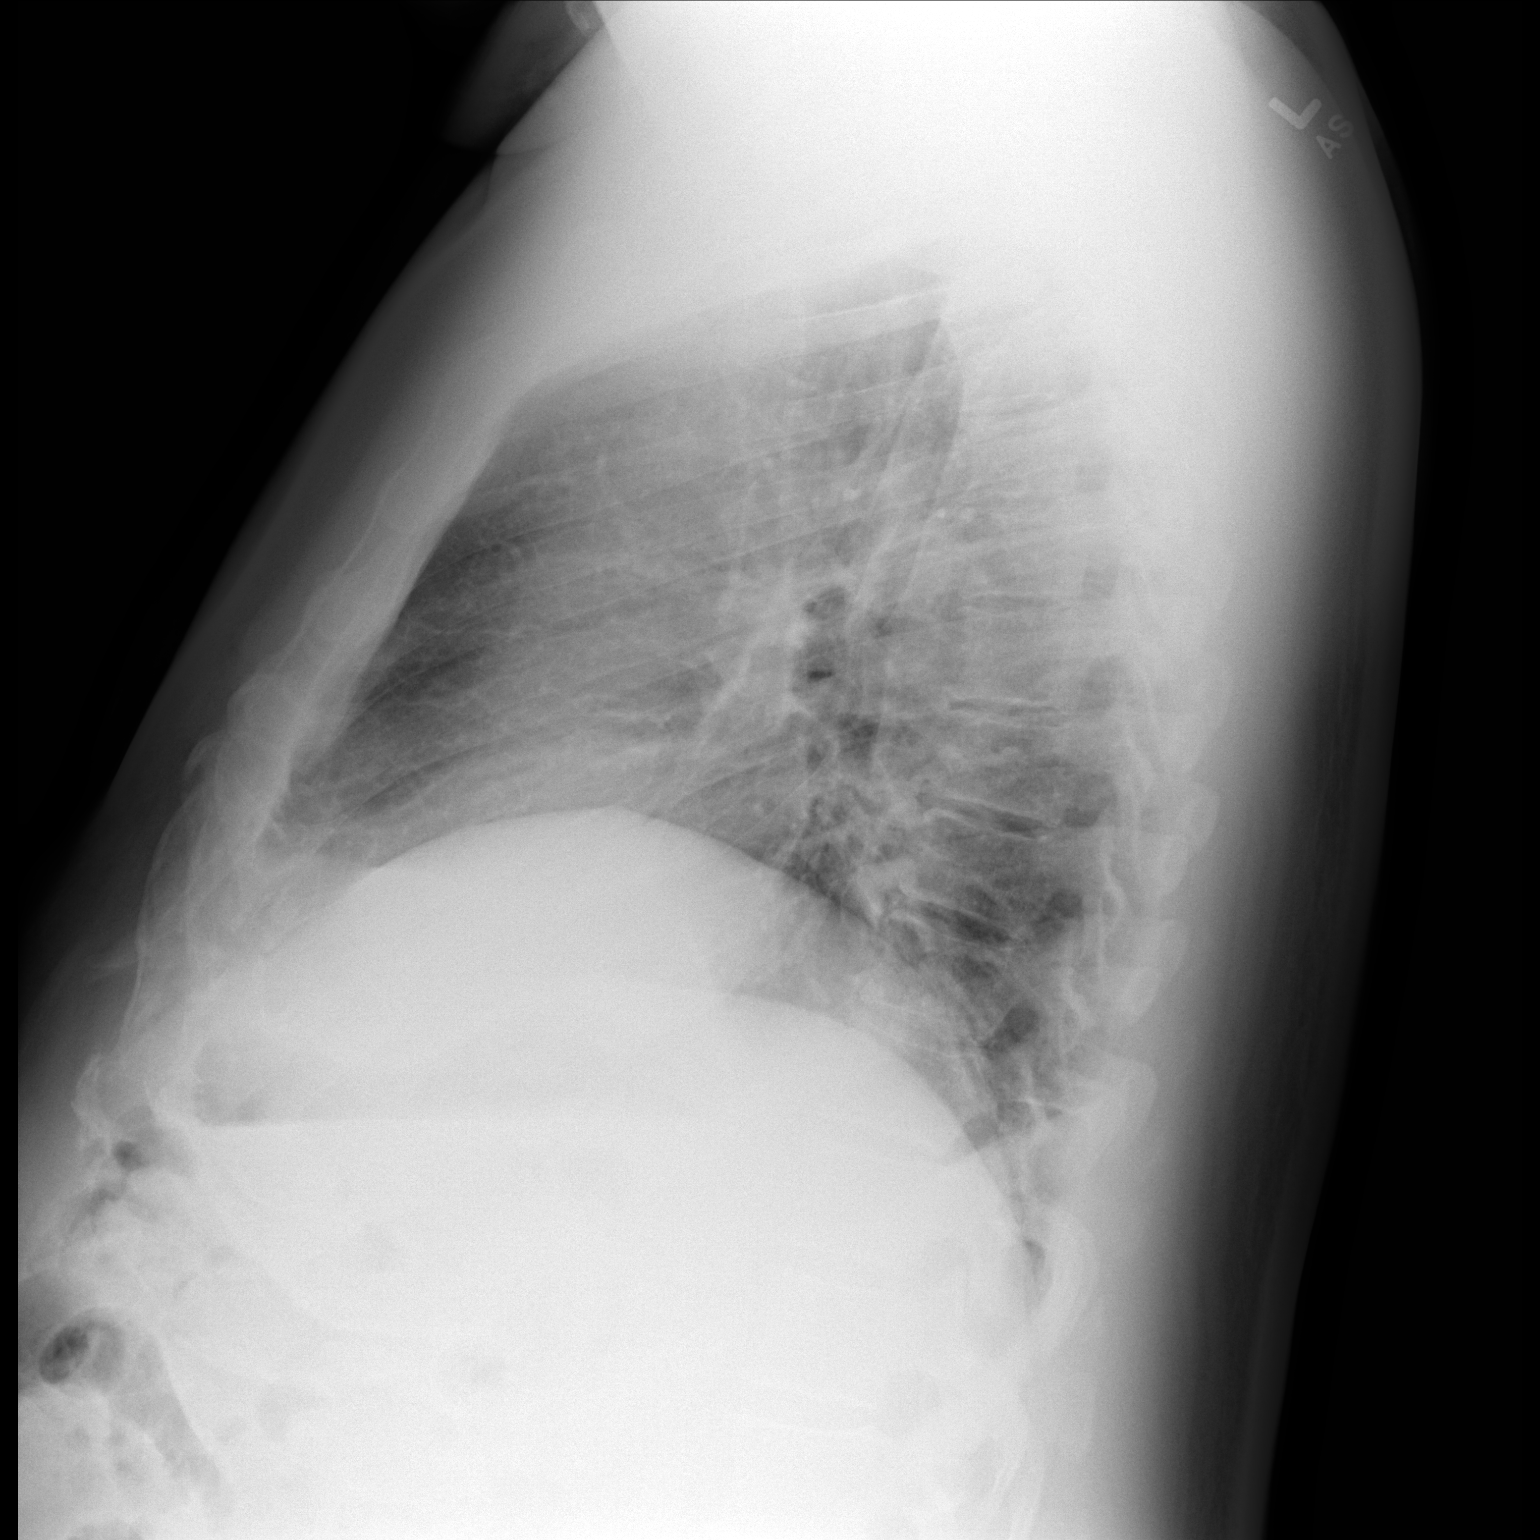

[2 of 2 positions shown; findings below may reference images not displayed]

FINDINGS: The heart size and mediastinal contours are within normal limits.
Both lungs are clear. The visualized skeletal structures are
unremarkable.
IMPRESSION: No active cardiopulmonary disease.

## 2020-06-30 MED ORDER — TRULICITY 3 MG/0.5ML ~~LOC~~ SOAJ: 3.0000 mg | SUBCUTANEOUS | 3 refills | Status: DC

## 2020-06-30 NOTE — Patient Instructions (Addendum)
Your blood pressure is high today.  Please see your primary care provider soon, to have it rechecked check your blood sugar twice a day.  vary the time of day when you check, between before the 3 meals, and at bedtime.  also check if you have symptoms of your blood sugar being too high or too low.  please keep a record of the readings and bring it to your next appointment here (or you can bring the meter itself).  You can write it on any piece of paper.  please call us sooner if your blood sugar goes below 70, or if you have a lot of readings over 200.   We will need to take this complex situation in stages.   Please continue the same insulin.   I have also sent a prescription to your pharmacy, to double the Trulicity again.   Please come back for a follow-up appointment in 6 weeks.

## 2020-06-30 NOTE — Progress Notes (Signed)
Subjective:    Patient ID: Andrew Pruitt, male    DOB: 1955/12/26, 64 y.o.   MRN: [id number]  HPI Pt returns for f/u of diabetes mellitus: DM type: Insulin-requiring type 2 Dx'ed: 0865 Complications: PN, PDR, and CRI Therapy: insulin since 2005 DKA: never Severe hypoglycemia: never Pancreatitis: never Pancreatic imaging: never SDOH: none Other: she declines multiple daily injections: Lantus was changed to NPH, due to pattern of cbg's.   Interval history: I reviewed continuous glucose monitor data.  glucoses vary from 150-400.  It is in general lowest in the afternoon (pt says after he misses lunch).  pt states he feels well in general. He takes meds as rx'ed.  He could not afford [place].   Past Medical History:  Diagnosis Date  . Arthritis   . Depression   . Diabetes mellitus without complication ([place])   . Hyperlipidemia   . Hypertension     Past Surgical History:  Procedure Laterality Date  . TONSILLECTOMY      Social History   Socioeconomic History  . Marital status: Married    Spouse name: Not on file  . Number of children: Not on file  . Years of education: Not on file  . Highest education level: Not on file  Occupational History  . Not on file  Tobacco Use  . Smoking status: Never Smoker  . Smokeless tobacco: Never Used  Vaping Use  . Vaping Use: Never used  Substance and Sexual Activity  . Alcohol use: Not Currently  . Drug use: Never  . Sexual activity: Yes  Other Topics Concern  . Not on file  Social History Narrative  . Not on file   Social Determinants of Health   Financial Resource Strain:   . Difficulty of Paying Living Expenses: Not on file  Food Insecurity:   . Worried About Charity fundraiser in the Last Year: Not on file  . Ran Out of Food in the Last Year: Not on file  Transportation Needs:   . Lack of Transportation (Medical): Not on file  . Lack of Transportation (Non-Medical): Not on file  Physical Activity:   . Days of  Exercise per Week: Not on file  . Minutes of Exercise per Session: Not on file  Stress:   . Feeling of Stress : Not on file  Social Connections:   . Frequency of Communication with Friends and Family: Not on file  . Frequency of Social Gatherings with Friends and Family: Not on file  . Attends Religious Services: Not on file  . Active Member of Clubs or Organizations: Not on file  . Attends Archivist Meetings: Not on file  . Marital Status: Not on file  Intimate Partner Violence:   . Fear of Current or Ex-Partner: Not on file  . Emotionally Abused: Not on file  . Physically Abused: Not on file  . Sexually Abused: Not on file    Current Outpatient Medications on File Prior to Visit  Medication Sig Dispense Refill  . benzonatate (TESSALON) 200 MG capsule Take 1 capsule (200 mg total) by mouth every 8 (eight) hours. 21 capsule 0  . Continuous Blood Gluc Receiver (FREESTYLE LIBRE 14 DAY READER) DEVI Use to check glucose levels TID; E11.9 1 each 0  . Continuous Blood Gluc Sensor (FREESTYLE LIBRE 14 DAY SENSOR) MISC Use to check glucose TID. Change every 14 days. E 11.9 6 each 3  . hydrochlorothiazide (HYDRODIURIL) 25 MG tablet Take 1 tablet (25 mg total)  by mouth daily. 30 tablet 1  . Insulin NPH, Human,, Isophane, (NOVOLIN N FLEXPEN) 100 UNIT/ML Kiwkpen Inject 80 Units into the skin every morning. 30 mL 11  . Insulin Pen Needle (B-D UF III MINI PEN NEEDLES) 31G X 5 MM MISC 1 each by Other route daily. E11.9 90 each 0  . rosuvastatin (CRESTOR) 40 MG tablet TAKE 1 TABLET BY MOUTH EVERY DAY 90 tablet 1  . amLODipine (NORVASC) 2.5 MG tablet Take 1 tablet (2.5 mg total) by mouth daily. (Patient not taking: Reported on 06/30/2020) 30 tablet 3   No current facility-administered medications on file prior to visit.    Allergies  Allergen Reactions  . Niaspan [Niacin] Other (See Comments)    Red skin, red eyes    Family History  Problem Relation Age of Onset  . Cancer Mother         Uterine   . Early death Mother   . Stroke Mother   . Diabetes Father   . Heart attack Father   . Heart disease Father   . Hyperlipidemia Father     BP (!) 160/90 (BP Location: Left Arm, Patient Position: Sitting, Cuff Size: Large)   Pulse 81   Ht 5\' 10"  (1.778 m)   Wt 266 lb (120.7 kg)   SpO2 96%   BMI 38.17 kg/m    Review of Systems He denies hypoglycemia and nausea.      Objective:   Physical Exam VITAL SIGNS:  See vs page GENERAL: no distress Pulses: dorsalis pedis intact bilat.   MSK: no deformity of the feet CV: no leg edema Skin:  no ulcer on the feet.  normal color and temp on the feet. Neuro: sensation is intact to touch on the feet.    Lab Results  Component Value Date   HGBA1C 11.7 (A) 06/30/2020        Assessment & Plan:  HTN: is noted today Insulin-requiring type 2 DM, with CRI: uncontrolled  Patient Instructions  Your blood pressure is high today.  Please see your primary care provider soon, to have it rechecked check your blood sugar twice a day.  vary the time of day when you check, between before the 3 meals, and at bedtime.  also check if you have symptoms of your blood sugar being too high or too low.  please keep a record of the readings and bring it to your next appointment here (or you can bring the meter itself).  You can write it on any piece of paper.  please call us sooner if your blood sugar goes below 70, or if you have a lot of readings over 200.   We will need to take this complex situation in stages.   Please continue the same insulin.   I have also sent a prescription to your pharmacy, to double the Trulicity again.   Please come back for a follow-up appointment in 6 weeks.

## 2020-07-05 ENCOUNTER — Other Ambulatory Visit: Payer: Self-pay

## 2020-07-06 ENCOUNTER — Telehealth: Payer: Self-pay

## 2020-07-06 ENCOUNTER — Encounter: Payer: Self-pay | Admitting: Gastroenterology

## 2020-07-06 ENCOUNTER — Ambulatory Visit (INDEPENDENT_AMBULATORY_CARE_PROVIDER_SITE_OTHER): Payer: Medicare Other | Admitting: Gastroenterology

## 2020-07-06 VITALS — BP 132/80 | HR 88 | Ht 70.0 in | Wt 269.0 lb

## 2020-07-06 DIAGNOSIS — E1165 Type 2 diabetes mellitus with hyperglycemia: Secondary | ICD-10-CM

## 2020-07-06 DIAGNOSIS — IMO0002 Reserved for concepts with insufficient information to code with codable children: Secondary | ICD-10-CM

## 2020-07-06 DIAGNOSIS — R195 Other fecal abnormalities: Secondary | ICD-10-CM

## 2020-07-06 DIAGNOSIS — E1122 Type 2 diabetes mellitus with diabetic chronic kidney disease: Secondary | ICD-10-CM | POA: Diagnosis not present

## 2020-07-06 MED ORDER — PLENVU 140 G PO SOLR: 140.0000 g | ORAL | 0 refills | Status: DC

## 2020-07-06 NOTE — Patient Instructions (Addendum)
If you are age 65 or older, your body mass index should be between 23-30. Your Body mass index is 38.6 kg/m. If this is out of the aforementioned range listed, please consider follow up with your Primary Care Provider.  If you are age 21 or younger, your body mass index should be between 19-25. Your Body mass index is 38.6 kg/m. If this is out of the aformentioned range listed, please consider follow up with your Primary Care Provider.   You have been scheduled for a colonoscopy. Please follow written instructions given to you at your visit today.  Please pick up your prep supplies at the pharmacy within the next 1-3 days. If you use inhalers (even only as needed), please bring them with you on the day of your procedure.    We will talk with your PCP about diabetic management instructions.  It was a pleasure to see you today!  Dr. Loletha Carrow

## 2020-07-06 NOTE — Telephone Encounter (Signed)
This patient has been scheduled for a colonoscopy with Dr Loletha Carrow on 07-12-2020. He is on Novolin and Trulicity. Please advise on instructions for dosing prior to colonoscopy.   Our colonoscopy prep protocol requires that:  the patient must be on a clear liquid diet the entire day prior to the procedure date as well as the morning of the procedure  the patient must be NPO for 3 hours prior to the procedure   the patient must consume a PEG 3350 solution to prepare for the procedure.  Please advise [place] of any adjustments that need to be made to the patients insulin  therapy prior to the above procedure date.    Thank you for your help with this matter.   Vivien Rota, CMA

## 2020-07-06 NOTE — Progress Notes (Signed)
[facility] Consult Note:  History: Andrew Pruitt 07/06/2020  Referring provider: Brunetta Jeans, PA-C  Reason for consult/chief complaint: New Patient (Initial Visit) (positive cologuard)   Subjective  HPI:  This is a pleasant 64 year old man accompanied by his wife, referred by primary care for positive Cologuard test 2 months ago.  That was his first colon cancer screening test.  He has occasional constipation or loose stool for several years, denies rectal bleeding.  He denies abdominal pain, nausea or vomiting or dysphagia. Chart review indicates that his diabetes has been under poor control for quite some time, he has had medication changes since moving to this area and getting a new primary care provider.  Morning blood sugars are sometimes in the 50s but more often as high as 300.   ROS:  Review of Systems  Constitutional: Negative for appetite change and unexpected weight change.  HENT: Negative for mouth sores and voice change.   Eyes: Negative for pain and redness.  Respiratory: Negative for cough and shortness of breath.   Cardiovascular: Negative for chest pain and palpitations.  Genitourinary: Negative for dysuria and hematuria.  Musculoskeletal: Negative for arthralgias and myalgias.  Skin: Negative for pallor and rash.  Neurological: Negative for weakness and headaches.  Hematological: Negative for adenopathy.     Past Medical History: Past Medical History:  Diagnosis Date  . Arthritis   . Depression   . Diabetes mellitus without complication ([place])   . Hyperlipidemia   . Hypertension      Past Surgical History: Past Surgical History:  Procedure Laterality Date  . TONSILLECTOMY       Family History: Family History  Problem Relation Age of Onset  . Cancer Mother        Uterine   . Early death Mother   . Stroke Mother   . Diabetes Father   . Heart attack Father   . Heart disease Father   . Hyperlipidemia Father     . Colon cancer Neg Hx   . Pancreatic cancer Neg Hx   . Esophageal cancer Neg Hx     Social History: Social History   Socioeconomic History  . Marital status: Married    Spouse name: Not on file  . Number of children: Not on file  . Years of education: Not on file  . Highest education level: Not on file  Occupational History  . Not on file  Tobacco Use  . Smoking status: Never Smoker  . Smokeless tobacco: Never Used  Vaping Use  . Vaping Use: Never used  Substance and Sexual Activity  . Alcohol use: Not Currently  . Drug use: Never  . Sexual activity: Yes  Other Topics Concern  . Not on file  Social History Narrative  . Not on file   Social Determinants of Health   Financial Resource Strain:   . Difficulty of Paying Living Expenses: Not on file  Food Insecurity:   . Worried About Charity fundraiser in the Last Year: Not on file  . Ran Out of Food in the Last Year: Not on file  Transportation Needs:   . Lack of Transportation (Medical): Not on file  . Lack of Transportation (Non-Medical): Not on file  Physical Activity:   . Days of Exercise per Week: Not on file  . Minutes of Exercise per Session: Not on file  Stress:   . Feeling of Stress : Not on file  Social Connections:   . Frequency of  Communication with Friends and Family: Not on file  . Frequency of Social Gatherings with Friends and Family: Not on file  . Attends Religious Services: Not on file  . Active Member of Clubs or Organizations: Not on file  . Attends Archivist Meetings: Not on file  . Marital Status: Not on file    Allergies: Allergies  Allergen Reactions  . Niaspan [Niacin] Other (See Comments)    Red skin, red eyes    Outpatient Meds: Current Outpatient Medications  Medication Sig Dispense Refill  . amLODipine (NORVASC) 2.5 MG tablet Take 1 tablet (2.5 mg total) by mouth daily. 30 tablet 3  . Continuous Blood Gluc Receiver (FREESTYLE LIBRE 14 DAY READER) DEVI Use to  check glucose levels TID; E11.9 1 each 0  . Continuous Blood Gluc Sensor (FREESTYLE LIBRE 14 DAY SENSOR) MISC Use to check glucose TID. Change every 14 days. E 11.9 6 each 3  . Dulaglutide (TRULICITY) 3 XF/8.1WE SOPN Inject 3 mg into the skin once a week. (Patient taking differently: Inject 3 mg into the skin once a week. Patient states he takes injection twice a week) 6 mL 3  . hydrochlorothiazide (HYDRODIURIL) 25 MG tablet Take 1 tablet (25 mg total) by mouth daily. 30 tablet 1  . Insulin NPH, Human,, Isophane, (NOVOLIN N FLEXPEN) 100 UNIT/ML Kiwkpen Inject 80 Units into the skin every morning. 30 mL 11  . Insulin Pen Needle (B-D UF III MINI PEN NEEDLES) 31G X 5 MM MISC 1 each by Other route daily. E11.9 90 each 0  . rosuvastatin (CRESTOR) 40 MG tablet TAKE 1 TABLET BY MOUTH EVERY DAY 90 tablet 1  . valsartan (DIOVAN) 80 MG tablet Take 80 mg by mouth daily.    Marland Kitchen PEG-KCl-NaCl-NaSulf-Na Asc-C (PLENVU) 140 g SOLR Take 140 g by mouth as directed. Manufacturer's coupon Universal coupon code:BIN: P2366821; GROUP: [id number]; PCN: [id number]; ID: [id number]; PAY NO MORE $50 1 each 0   No current facility-administered medications for this visit.      ___________________________________________________________________ Objective   Exam:  BP 132/80   Pulse 88   Ht 5\' 10"  (1.778 m)   Wt 269 lb (122 kg)   SpO2 95%   BMI 38.60 kg/m    General: Obese, otherwise well-appearing  Eyes: sclera anicteric, no redness  ENT: oral mucosa moist without lesions, no cervical or supraclavicular lymphadenopathy  CV: RRR without murmur, S1/S2, no JVD, mild peripheral edema  Resp: clear to auscultation bilaterally, normal RR and effort noted  GI: soft, no tenderness, with active bowel sounds. No guarding or palpable organomegaly noted.  Skin; warm and dry, no rash or jaundice noted  Neuro: awake, alert and oriented x 3. Normal gross motor function and fluent speech  Labs:  CBC Latest Ref Rng & Units  02/11/2020  WBC 4.0 - 10.5 K/uL 4.7  Hemoglobin 13.0 - 17.0 g/dL 17.4(H)  Hematocrit 39 - 52 % 49.5  Platelets 150 - 400 K/uL 172.0 Repeated and verified X2.   CMP Latest Ref Rng & Units 02/11/2020  Glucose 70 - 99 mg/dL 438(H)  BUN 6 - 23 mg/dL 22  Creatinine 0.40 - 1.50 mg/dL 1.26  Sodium 135 - 145 mEq/L 133(L)  Potassium 3.5 - 5.1 mEq/L 4.6  Chloride 96 - 112 mEq/L 96  CO2 19 - 32 mEq/L 29  Calcium 8.4 - 10.5 mg/dL 9.4  Total Protein 6.0 - 8.3 g/dL 6.8  Total Bilirubin 0.2 - 1.2 mg/dL 0.7  Alkaline Phos 39 - 117 U/L  80  AST 0 - 37 U/L 15  ALT 0 - 53 U/L 17   Hgb A1C 11.7 - 14.7 last several months   Assessment: Encounter Diagnoses  Name Primary?  . Positive colorectal cancer screening using Cologuard test Yes  . Uncontrolled type II diabetes mellitus with chronic kidney disease ([place])     We discussed Cologuard test, possible explanations for a positive (precancerous colorectal polyps, false-positive, colon cancer). I recommended a colonoscopy, he was agreeable after discussion of procedure and risks.  The benefits and risks of the planned procedure were described in detail with the patient or (when appropriate) their health care proxy.  Risks were outlined as including, but not limited to, bleeding, infection, perforation, adverse medication reaction leading to cardiac or pulmonary decompensation, pancreatitis (if ERCP).  The limitation of incomplete mucosal visualization was also discussed.  No guarantees or warranties were given. Patient at increased risk for cardiopulmonary complications of procedure due to medical comorbidities.    We will also ask his primary care provider for some guidance regarding adjustment of insulin regimen day before and day of procedure.  Thank you for the courtesy of this consult.  Please call me with any questions or concerns.  Nelida Meuse III  CC: Referring provider noted above

## 2020-07-07 NOTE — Telephone Encounter (Signed)
Dr Loanne Drilling please advise. Thank you.

## 2020-07-07 NOTE — Telephone Encounter (Signed)
Managed by Endocrinology. You will need to reach out to them regarding this

## 2020-07-07 NOTE — Telephone Encounter (Signed)
As high as blood sugar is, no adjustment is needed.

## 2020-07-08 ENCOUNTER — Ambulatory Visit (INDEPENDENT_AMBULATORY_CARE_PROVIDER_SITE_OTHER): Payer: Medicare Other | Admitting: Physician Assistant

## 2020-07-08 ENCOUNTER — Other Ambulatory Visit: Payer: Self-pay

## 2020-07-08 ENCOUNTER — Encounter: Payer: Self-pay | Admitting: Physician Assistant

## 2020-07-08 VITALS — BP 130/80 | HR 80 | Temp 98.4°F | Resp 16 | Ht 70.0 in | Wt 269.0 lb

## 2020-07-08 DIAGNOSIS — E1159 Type 2 diabetes mellitus with other circulatory complications: Secondary | ICD-10-CM | POA: Diagnosis not present

## 2020-07-08 DIAGNOSIS — I152 Hypertension secondary to endocrine disorders: Secondary | ICD-10-CM | POA: Diagnosis not present

## 2020-07-08 DIAGNOSIS — N529 Male erectile dysfunction, unspecified: Secondary | ICD-10-CM | POA: Diagnosis not present

## 2020-07-08 MED ORDER — ROSUVASTATIN CALCIUM 40 MG PO TABS: 40.0000 mg | ORAL_TABLET | Freq: Every day | ORAL | 1 refills | Status: DC

## 2020-07-08 MED ORDER — SILDENAFIL CITRATE 20 MG PO TABS: ORAL_TABLET | ORAL | 1 refills | Status: DC

## 2020-07-08 NOTE — Patient Instructions (Signed)
Please continue current medication regimen.  We spoke with the pharmacy at the [facility] is 9:00 for 30-day supply.  The Jardiance and Trulicity given before by Dr Loanne Drilling are several hundred dollars so you will need to talk to them.   I have sent in a prescription for the generic viagra to take as directed. Let me know how things are going.

## 2020-07-08 NOTE — Progress Notes (Signed)
Patient presents to clinic today for reassessment of cough thought to be related to ARB therapy, as well as recheck of his blood pressure medication after change in therapy.  At last visit patient's Diovan was discontinued.  Patient was started on a regimen of amlodipine 2.5 mg daily along with his hydrochlorothiazide.  Patient endorses taking medications as directed.  Is tolerating well.  Has noted substantial improvement in cough, noting it is almost completely resolved.  Denies any new symptoms.  Has been checking blood pressure at home averaging up to 130/80. Patient denies chest pain, palpitations, lightheadedness, dizziness, vision changes or frequent headaches.  BP Readings from Last 3 Encounters:  07/08/20 130/80  07/06/20 132/80  06/30/20 (!) 160/90   Patient endorses ongoing issue with erectile dysfunction.  Sometimes is able to achieve in a semierect state but cannot maintain erection.  Other times is unable to get an erection altogether.  Denies any abnormal curvature or pain with erection.  Denies any prior trauma or injury.  Patient is a type II diabetic with hyperlipidemia, currently managed by endocrinology.  Would like to discuss options to help with this.  Past Medical History:  Diagnosis Date   Arthritis    Depression    Diabetes mellitus without complication ([place])    Hyperlipidemia    Hypertension     Current Outpatient Medications on File Prior to Visit  Medication Sig Dispense Refill   amLODipine (NORVASC) 2.5 MG tablet Take 1 tablet (2.5 mg total) by mouth daily. 30 tablet 3   Continuous Blood Gluc Receiver (FREESTYLE LIBRE 14 DAY READER) DEVI Use to check glucose levels TID; E11.9 1 each 0   Continuous Blood Gluc Sensor (FREESTYLE LIBRE 14 DAY SENSOR) MISC Use to check glucose TID. Change every 14 days. E 11.9 6 each 3   Dulaglutide (TRULICITY) 3 HB/7.1IR SOPN Inject 3 mg into the skin once a week. (Patient taking differently: Inject 3 mg into the skin  once a week. Patient states he takes injection twice a week) 6 mL 3   hydrochlorothiazide (HYDRODIURIL) 25 MG tablet Take 1 tablet (25 mg total) by mouth daily. 30 tablet 1   Insulin NPH, Human,, Isophane, (NOVOLIN N FLEXPEN) 100 UNIT/ML Kiwkpen Inject 80 Units into the skin every morning. 30 mL 11   Insulin Pen Needle (B-D UF III MINI PEN NEEDLES) 31G X 5 MM MISC 1 each by Other route daily. E11.9 90 each 0   PEG-KCl-NaCl-NaSulf-Na Asc-C (PLENVU) 140 g SOLR Take 140 g by mouth as directed. Manufacturer's coupon Universal coupon code:BIN: P2366821; GROUP: [id number]; PCN: [id number]; ID: [id number]; PAY NO MORE $50 1 each 0   No current facility-administered medications on file prior to visit.    Allergies  Allergen Reactions   Niaspan [Niacin] Other (See Comments)    Red skin, red eyes    Family History  Problem Relation Age of Onset   Cancer Mother        Uterine    Early death Mother    Stroke Mother    Diabetes Father    Heart attack Father    Heart disease Father    Hyperlipidemia Father    Colon cancer Neg Hx    Pancreatic cancer Neg Hx    Esophageal cancer Neg Hx     Social History   Socioeconomic History   Marital status: Married    Spouse name: Not on file   Number of children: Not on file   Years of education: Not on file  Highest education level: Not on file  Occupational History   Not on file  Tobacco Use   Smoking status: Never Smoker   Smokeless tobacco: Never Used  Vaping Use   Vaping Use: Never used  Substance and Sexual Activity   Alcohol use: Not Currently   Drug use: Never   Sexual activity: Yes  Other Topics Concern   Not on file  Social History Narrative   Not on file   Social Determinants of Health   Financial Resource Strain:    Difficulty of Paying Living Expenses: Not on file  Food Insecurity:    Worried About [place] in the Last Year: Not on file   Ran Out of Food in the Last Year: Not on file    Transportation Needs:    Lack of Transportation (Medical): Not on file   Lack of Transportation (Non-Medical): Not on file  Physical Activity:    Days of Exercise per Week: Not on file   Minutes of Exercise per Session: Not on file  Stress:    Feeling of Stress : Not on file  Social Connections:    Frequency of Communication with Friends and Family: Not on file   Frequency of Social Gatherings with Friends and Family: Not on file   Attends Religious Services: Not on file   Active Member of Clubs or Organizations: Not on file   Attends Archivist Meetings: Not on file   Marital Status: Not on file   Review of Systems - See HPI.  All other ROS are negative.  BP 130/80    Pulse 80    Temp 98.4 F (36.9 C) (Temporal)    Resp 16    Ht 5\' 10"  (1.778 m)    Wt 269 lb (122 kg)    SpO2 98%    BMI 38.60 kg/m   Physical Exam Vitals reviewed.  Constitutional:      Appearance: Normal appearance.  HENT:     Head: Normocephalic and atraumatic.  Cardiovascular:     Rate and Rhythm: Normal rate and regular rhythm.     Pulses: Normal pulses.     Heart sounds: Normal heart sounds.  Musculoskeletal:     Cervical back: Neck supple.  Neurological:     General: No focal deficit present.     Mental Status: He is alert and oriented to person, place, and time.  Psychiatric:        Mood and Affect: Mood normal.     Recent Results (from the past 2160 hour(s))  Cologuard     Status: Abnormal   Collection Time: 04/26/20 12:00 AM  Result Value Ref Range   Cologuard Positive (A) Negative  POCT HgB A1C     Status: Abnormal   Collection Time: 05/18/20  8:40 AM  Result Value Ref Range   Hemoglobin A1C 11.7 (A) 4.0 - 5.6 %   HbA1c POC (<> result, manual entry)     HbA1c, POC (prediabetic range)     HbA1c, POC (controlled diabetic range)    Novel Coronavirus, NAA ([facility])     Status: Abnormal   Collection Time: 05/23/20  6:09 PM   Specimen: Nasopharyngeal(NP) swabs in vial  transport medium   Nasopharynge  Patient  Result Value Ref Range   SARS-CoV-2, NAA Detected (A) Not Detected    Comment: Patients who have a positive COVID-19 test result may now have treatment options. Treatment options are available for patients with mild to moderate symptoms and for  hospitalized patients. Visit our website at [email or web address] for resources and information. This nucleic acid amplification test was developed and its performance characteristics determined by [employer]. Nucleic acid amplification tests include RT-PCR and TMA. This test has not been FDA cleared or approved. This test has been authorized by FDA under an Emergency Use Authorization (EUA). This test is only authorized for the duration of time the declaration that circumstances exist justifying the authorization of the emergency use of in vitro diagnostic tests for detection of SARS-CoV-2 virus and/or diagnosis of COVID-19 infection under section 564(b)(1) of the Act, 21 U.S.C. 390ZES-9(Q) (1), unless the authorization is terminated or revoked sooner. When diagnostic testing is negativ e, the possibility of a false negative result should be considered in the context of a patient's recent exposures and the presence of clinical signs and symptoms consistent with COVID-19. An individual without symptoms of COVID-19 and who is not shedding SARS-CoV-2 virus would expect to have a negative (not detected) result in this assay.   POCT glycosylated hemoglobin (Hb A1C)     Status: Abnormal   Collection Time: 06/30/20 10:00 AM  Result Value Ref Range   Hemoglobin A1C 11.7 (A) 4.0 - 5.6 %   HbA1c POC (<> result, manual entry)     HbA1c, POC (prediabetic range)     HbA1c, POC (controlled diabetic range)      Assessment/Plan: 1. Hypertension associated with diabetes ([place]) Tolerating the medicines well.  Resolution of cough with ARB.  BP stable.  Asymptomatic.  Continue current regimen.   Will monitor for any recurrence of cough as this will prompt need for further evaluation with specialist.  2. Erectile dysfunction, unspecified erectile dysfunction type Will start trial of generic Viagra. Discussed if no major improvement, would recommend Urology consultation.   This visit occurred during the SARS-CoV-2 public health emergency.  Safety protocols were in place, including screening questions prior to the visit, additional usage of staff PPE, and extensive cleaning of exam room while observing appropriate contact time as indicated for disinfecting solutions.   Leeanne Rio, PA-C

## 2020-07-11 NOTE — Telephone Encounter (Signed)
Understood.  It's late in the day, so I called patient to tell him that no insulin adjustment needed.

## 2020-07-11 NOTE — Telephone Encounter (Signed)
Dr Loletha Carrow,  Please see communication below.

## 2020-07-12 ENCOUNTER — Encounter: Payer: Self-pay | Admitting: Gastroenterology

## 2020-07-12 ENCOUNTER — Other Ambulatory Visit: Payer: Self-pay

## 2020-07-12 ENCOUNTER — Ambulatory Visit (AMBULATORY_SURGERY_CENTER): Payer: Medicare Other | Admitting: Gastroenterology

## 2020-07-12 VITALS — BP 140/80 | HR 76 | Temp 97.1°F | Resp 11 | Ht 70.0 in | Wt 269.0 lb

## 2020-07-12 DIAGNOSIS — D120 Benign neoplasm of cecum: Secondary | ICD-10-CM

## 2020-07-12 DIAGNOSIS — D123 Benign neoplasm of transverse colon: Secondary | ICD-10-CM

## 2020-07-12 DIAGNOSIS — D122 Benign neoplasm of ascending colon: Secondary | ICD-10-CM | POA: Diagnosis not present

## 2020-07-12 DIAGNOSIS — D124 Benign neoplasm of descending colon: Secondary | ICD-10-CM | POA: Diagnosis not present

## 2020-07-12 DIAGNOSIS — R195 Other fecal abnormalities: Secondary | ICD-10-CM

## 2020-07-12 MED ORDER — SODIUM CHLORIDE 0.9 % IV SOLN: 500.0000 mL | Freq: Once | INTRAVENOUS | Status: DC

## 2020-07-12 NOTE — Op Note (Signed)
[facility] Patient Name: Andrew Pruitt Procedure Date: 07/12/2020 9:21 AM MRN: [id number] Endoscopist: Mallie Mussel L. Loletha Carrow , MD Age: 64 Referring MD:  Date of Birth: [birth year] Gender: Male Account #: [id number] Procedure:                Colonoscopy Indications:              Positive Cologuard test Medicines:                Monitored Anesthesia Care Procedure:                Pre-Anesthesia Assessment:                           - Prior to the procedure, a History and Physical                            was performed, and patient medications and                            allergies were reviewed. The patient's tolerance of                            previous anesthesia was also reviewed. The risks                            and benefits of the procedure and the sedation                            options and risks were discussed with the patient.                            All questions were answered, and informed consent                            was obtained. Prior Anticoagulants: The patient has                            taken no previous anticoagulant or antiplatelet                            agents. ASA Grade Assessment: II - A patient with                            mild systemic disease. After reviewing the risks                            and benefits, the patient was deemed in                            satisfactory condition to undergo the procedure.                           After obtaining informed consent, the colonoscope  was passed under direct vision. Throughout the                            procedure, the patient's blood pressure, pulse, and                            oxygen saturations were monitored continuously. The                            Colonoscope was introduced through the anus and                            advanced to the the cecum, identified by                            appendiceal orifice and ileocecal valve. The                             colonoscopy was somewhat difficult due to a                            redundant colon. The patient tolerated the                            procedure well. The quality of the bowel                            preparation was good after lavage (especially left                            colon). The ileocecal valve, appendiceal orifice,                            and rectum were photographed. The bowel preparation                            used was Plenvu. Scope In: 9:24:39 AM Scope Out: 10:00:25 AM Scope Withdrawal Time: 0 hours 27 minutes 43 seconds  Total Procedure Duration: 0 hours 35 minutes 46 seconds  Findings:                 The perianal and digital rectal examinations were                            normal.                           A few diverticula were found in the left colon.                           Five sessile polyps were found in the descending                            colon (1), transverse(1) colon and cecum (3). The  polyps were 2 to 6 mm in size. These polyps were                            removed with a cold snare. Resection and retrieval                            were complete. (Jar 1)                           Four sessile polyps were found in the descending                            colon(1) and ascending colon(3). The polyps were 4                            to 6 mm in size. These polyps were removed with a                            cold snare. Resection and retrieval were complete.                            (Jar 2)                           A 15 mm polyp was found in the ascending colon. The                            polyp was sessile. The polyp was removed en bloc                            with a hot snare. Resection and retrieval were                            complete after cold cutting polyp with snare to                            retrieve through suction. (Jar 3)                           A  diminutive polyp was found in the descending                            colon. The polyp was sessile. The polyp was removed                            with a cold snare. Resection and retrieval were                            complete.                           The exam was otherwise without abnormality on  direct and retroflexion views. Complications:            No immediate complications. Estimated Blood Loss:     Estimated blood loss was minimal. Impression:               - Diverticulosis in the left colon.                           - Five 2 to 6 mm polyps in the descending colon, in                            the transverse colon and in the cecum, removed with                            a cold snare. Resected and retrieved.                           - Four 4 to 6 mm polyps in the descending colon and                            in the ascending colon, removed with a cold snare.                            Resected and retrieved.                           - One 15 mm polyp in the ascending colon, removed                            with a hot snare. Resected and retrieved.                           - One diminutive polyp in the descending colon,                            removed with a cold snare. Resected and retrieved.                           - The examination was otherwise normal on direct                            and retroflexion views. Recommendation:           - Patient has a contact number available for                            emergencies. The signs and symptoms of potential                            delayed complications were discussed with the                            patient. Return to normal activities tomorrow.  Written discharge instructions were provided to the                            patient.                           - Resume previous diet.                           - Continue present medications.                            - Await pathology results.                           - Repeat colonoscopy in 1 year for surveillance.                            (Start AM prep dose earlier and consume more water                            with prep for next exam) Mallie Mussel L. Loletha Carrow, MD 07/12/2020 10:08:44 AM This report has been signed electronically.

## 2020-07-12 NOTE — Progress Notes (Signed)
Called to room to assist during endoscopic procedure.  Patient ID and intended procedure confirmed with present staff. Received instructions for my participation in the procedure from the performing physician.  

## 2020-07-12 NOTE — Patient Instructions (Signed)
Please read handouts provided. Continue present medications. Await pathology results. Repeat colonoscopy in 1 year for surveillance.     YOU HAD AN ENDOSCOPIC PROCEDURE TODAY AT THE [facility]:   Refer to the procedure report that was given to you for any specific questions about what was found during the examination.  If the procedure report does not answer your questions, please call your gastroenterologist to clarify.  If you requested that your care partner not be given the details of your procedure findings, then the procedure report has been included in a sealed envelope for you to review at your convenience later.  YOU SHOULD EXPECT: Some feelings of bloating in the abdomen. Passage of more gas than usual.  Walking can help get rid of the air that was put into your GI tract during the procedure and reduce the bloating. If you had a lower endoscopy (such as a colonoscopy or flexible sigmoidoscopy) you may notice spotting of blood in your stool or on the toilet paper. If you underwent a bowel prep for your procedure, you may not have a normal bowel movement for a few days.  Please Note:  You might notice some irritation and congestion in your nose or some drainage.  This is from the oxygen used during your procedure.  There is no need for concern and it should clear up in a day or so.  SYMPTOMS TO REPORT IMMEDIATELY:   Following lower endoscopy (colonoscopy or flexible sigmoidoscopy):  Excessive amounts of blood in the stool  Significant tenderness or worsening of abdominal pains  Swelling of the abdomen that is new, acute  Fever of 100F or higher    For urgent or emergent issues, a gastroenterologist can be reached at any hour by calling [phone]. Do not use MyChart messaging for urgent concerns.    DIET:  We do recommend a small meal at first, but then you may proceed to your regular diet.  Drink plenty of fluids but you should avoid alcoholic beverages for 24  hours.  ACTIVITY:  You should plan to take it easy for the rest of today and you should NOT DRIVE or use heavy machinery until tomorrow (because of the sedation medicines used during the test).    FOLLOW UP: Our staff will call the number listed on your records 48-72 hours following your procedure to check on you and address any questions or concerns that you may have regarding the information given to you following your procedure. If we do not reach you, we will leave a message.  We will attempt to reach you two times.  During this call, we will ask if you have developed any symptoms of COVID 19. If you develop any symptoms (ie: fever, flu-like symptoms, shortness of breath, cough etc.) before then, please call [phone].  If you test positive for Covid 19 in the 2 weeks post procedure, please call and report this information to us.    If any biopsies were taken you will be contacted by phone or by letter within the next 1-3 weeks.  Please call us at [phone] if you have not heard about the biopsies in 3 weeks.    SIGNATURES/CONFIDENTIALITY: You and/or your care partner have signed paperwork which will be entered into your electronic medical record.  These signatures attest to the fact that that the information above on your After Visit Summary has been reviewed and is understood.  Full responsibility of the confidentiality of this discharge information lies with   you and/or your care-partner.

## 2020-07-12 NOTE — Progress Notes (Signed)
PT taken to PACU. Monitors in place. VSS. Report given to RN. 

## 2020-07-14 ENCOUNTER — Telehealth: Payer: Self-pay

## 2020-07-14 NOTE — Telephone Encounter (Signed)
  Follow up Call-  Call back number 07/12/2020  Post procedure Call Back phone  # [phone]  Permission to leave phone message Yes     Patient questions:  Do you have a fever, pain , or abdominal swelling? No. Pain Score  0 *  Have you tolerated food without any problems? Yes.    Have you been able to return to your normal activities? Yes.    Do you have any questions about your discharge instructions: Diet   No. Medications  No. Follow up visit  No.  Do you have questions or concerns about your Care? No.  Actions: * If pain score is 4 or above: No action needed, pain <4.  1. Have you developed a fever since your procedure? no  2.   Have you had an respiratory symptoms (SOB or cough) since your procedure? no  3.   Have you tested positive for COVID 19 since your procedure no  4.   Have you had any family members/close contacts diagnosed with the COVID 19 since your procedure?  no   If yes to any of these questions please route to Joylene John, RN and Joella Prince, RN

## 2020-07-19 ENCOUNTER — Encounter: Payer: Self-pay | Admitting: Gastroenterology

## 2020-07-19 ENCOUNTER — Other Ambulatory Visit: Payer: Self-pay | Admitting: Endocrinology

## 2020-07-19 DIAGNOSIS — E1140 Type 2 diabetes mellitus with diabetic neuropathy, unspecified: Secondary | ICD-10-CM

## 2020-07-24 ENCOUNTER — Other Ambulatory Visit: Payer: Self-pay | Admitting: Endocrinology

## 2020-07-26 ENCOUNTER — Other Ambulatory Visit: Payer: Self-pay | Admitting: Physician Assistant

## 2020-08-02 ENCOUNTER — Telehealth: Payer: Self-pay

## 2020-08-02 NOTE — Telephone Encounter (Signed)
Will wait for request. Not able to pull up a [facility] pharmacy in [place].

## 2020-08-02 NOTE — Telephone Encounter (Signed)
Patient states he is going through [facility] in [place] (mail order) for his diabetic testing supplies.   States that this pharmacy will be sending [place] a request for supplies.

## 2020-08-11 ENCOUNTER — Other Ambulatory Visit: Payer: Self-pay

## 2020-08-11 ENCOUNTER — Ambulatory Visit (INDEPENDENT_AMBULATORY_CARE_PROVIDER_SITE_OTHER): Payer: Medicare Other | Admitting: Ophthalmology

## 2020-08-11 ENCOUNTER — Encounter (INDEPENDENT_AMBULATORY_CARE_PROVIDER_SITE_OTHER): Payer: Self-pay | Admitting: Ophthalmology

## 2020-08-11 DIAGNOSIS — E113511 Type 2 diabetes mellitus with proliferative diabetic retinopathy with macular edema, right eye: Secondary | ICD-10-CM | POA: Diagnosis not present

## 2020-08-11 DIAGNOSIS — H33311 Horseshoe tear of retina without detachment, right eye: Secondary | ICD-10-CM

## 2020-08-11 DIAGNOSIS — E113592 Type 2 diabetes mellitus with proliferative diabetic retinopathy without macular edema, left eye: Secondary | ICD-10-CM | POA: Diagnosis not present

## 2020-08-11 NOTE — Progress Notes (Signed)
08/11/2020     CHIEF COMPLAINT Patient presents for Retina Follow Up   HISTORY OF PRESENT ILLNESS: Andrew Pruitt is a 64 y.o. male who presents to the clinic today for:   HPI    Retina Follow Up    Patient presents with  Diabetic Retinopathy.  In both eyes.  This started 8 weeks ago.  Duration of 8 weeks.  Since onset it is stable.          Comments    8 WK F/U OU   Pt reports stable vision, no f/f, no pain or pressure.     Last A1C: 11 taken 06/2020    Last BS: 275 taken this AM        Last edited by Nichola Sizer D on 08/11/2020  8:05 AM. (History)      Referring physician: Brunetta Jeans, PA-C [id number] [place],  [place] [id number]  HISTORICAL INFORMATION:   Selected notes from the MEDICAL RECORD NUMBER    Lab Results  Component Value Date   HGBA1C 11.7 (A) 06/30/2020     CURRENT MEDICATIONS: No current outpatient medications on file. (Ophthalmic Drugs)   No current facility-administered medications for this visit. (Ophthalmic Drugs)   Current Outpatient Medications (Other)  Medication Sig  . amLODipine (NORVASC) 2.5 MG tablet Take 1 tablet (2.5 mg total) by mouth daily.  . B-D UF III MINI PEN NEEDLES 31G X 5 MM MISC USE DAILY  . Continuous Blood Gluc Receiver (FREESTYLE LIBRE 14 DAY READER) DEVI Use to check glucose levels TID; E11.9  . Continuous Blood Gluc Sensor (FREESTYLE LIBRE 14 DAY SENSOR) MISC Use to check glucose TID. Change every 14 days. E 11.9  . Dulaglutide (TRULICITY) 3 RS/8.5IO SOPN Inject 3 mg into the skin once a week. (Patient taking differently: Inject 3 mg into the skin once a week. Patient states he takes injection twice a week)  . hydrochlorothiazide (HYDRODIURIL) 25 MG tablet Take 1 tablet (25 mg total) by mouth daily.  . Insulin NPH, Human,, Isophane, (NOVOLIN N FLEXPEN) 100 UNIT/ML Kiwkpen Inject 80 Units into the skin every morning.  . rosuvastatin (CRESTOR) 40 MG tablet Take 1 tablet (40 mg total) by mouth  daily.  . sildenafil (REVATIO) 20 MG tablet Take 2-3 tablets as needed at least 30 minutes prior to desired effect. No more than in 1 dosing in a 24 hour period.  . TRULICITY 2.70 JJ/0.0XF SOPN INJECT 0.5 MLS (0.75 MG TOTAL) INTO THE SKIN ONCE A WEEK.   No current facility-administered medications for this visit. (Other)      REVIEW OF SYSTEMS:    ALLERGIES Allergies  Allergen Reactions  . Niaspan [Niacin] Other (See Comments)    Red skin, red eyes    PAST MEDICAL HISTORY Past Medical History:  Diagnosis Date  . Arthritis   . Depression   . Diabetes mellitus without complication ([place])   . Hyperlipidemia   . Hypertension    Past Surgical History:  Procedure Laterality Date  . TONSILLECTOMY      FAMILY HISTORY Family History  Problem Relation Age of Onset  . Cancer Mother        Uterine   . Early death Mother   . Stroke Mother   . Diabetes Father   . Heart attack Father   . Heart disease Father   . Hyperlipidemia Father   . Colon cancer Neg Hx   . Pancreatic cancer Neg Hx   . Esophageal cancer  Neg Hx     SOCIAL HISTORY Social History   Tobacco Use  . Smoking status: Never Smoker  . Smokeless tobacco: Never Used  Vaping Use  . Vaping Use: Never used  Substance Use Topics  . Alcohol use: Not Currently  . Drug use: Never         OPHTHALMIC EXAM:  Base Eye Exam    Visual Acuity (ETDRS)      Right Left   Dist [place] 20/40 -2 20/50 +1   Dist ph [place] 20/25 -1 20/30 -1       Tonometry (Tonopen, 8:11 AM)      Right Left   Pressure 21 18       Pupils      Pupils Dark Light Shape React APD   Right PERRL 4 3 Round Sluggish None   Left PERRL 4 3 Round Sluggish None       Visual Fields (Counting fingers)      Left Right    Full Full       Extraocular Movement      Right Left    Full Full       Neuro/Psych    Oriented x3: Yes   Mood/Affect: Normal       Dilation    Both eyes: 1.0% Mydriacyl, 2.5% Phenylephrine @ 8:11 AM        Slit  Lamp and Fundus Exam    External Exam      Right Left   External Normal Normal       Slit Lamp Exam      Right Left   Lids/Lashes Normal Normal   Conjunctiva/Sclera White and quiet White and quiet   Cornea Clear Clear   Anterior Chamber Deep and quiet Deep and quiet   Iris Round and reactive Round and reactive   Lens 2+ Nuclear sclerosis 1+ Nuclear sclerosis   Anterior Vitreous Normal Normal       Fundus Exam      Right Left   Posterior Vitreous Normal Normal   Disc Normal Normal   C/D Ratio 0.3 0.45   Macula Microaneurysms,  macular thickening, with hard exudates. Microaneurysms, no macular thickening   Vessels NPDR-Severe NPDR-Severe   Periphery horseshoe retinal tear 7:00, new Normal          IMAGING AND PROCEDURES  Imaging and Procedures for 08/11/20  OCT, Retina - OU - Both Eyes       Right Eye Quality was good. Scan locations included subfoveal. Central Foveal Thickness: 281. Progression has been stable.   Left Eye Quality was good. Scan locations included subfoveal. Central Foveal Thickness: 290. Progression has been stable.   Notes OD, and OS with no active CSME, will continue to monitor and observe       Color Fundus Photography Optos - OU - Both Eyes       Right Eye Progression has been stable.   Left Eye Progression has been stable.   Notes Severe nonproliferative diabetic retinopathy OU, with mid peripheral PRP, stable in the past, no active CSME       Repair Retinal Breaks, Laser - OD - Right Eye       Tear locations include inferior, temporal.   Time Out Confirmed correct patient, procedure, site, and patient consented.   Anesthesia Topical anesthesia was used. Anesthetic medications included Proparacaine 0.5%.   Laser Information The type of laser was diode. Color was yellow. The duration in seconds was 0.02. The spot size was  390 microns. Laser power was 260. Total spots was 344.   Post-op The patient tolerated the  procedure well. There were no complications. The patient received written and verbal post procedure care education.   Notes 3 rows of retinopexy applied inferotemporal right eye                ASSESSMENT/PLAN:  Horseshoe retinal tear, right eye New horseshoe retinal tear inferotemporal OD, the need for laser retinopexy reviewed with the patient, in order to prevent progression to full-fledged retinal detachment.  Booklet demonstrated and reviewed with the patient  Risk and benefits reviewed.  Type 2 diabetes mellitus with proliferative diabetic retinopathy of left eye without macular edema (HCC) Stable OS status post mid peripheral PRP observe  Diabetic macular edema of right eye with proliferative retinopathy associated with type 2 diabetes mellitus (HCC)  OD, stable status post mid peripheral PRP      ICD-10-CM   1. Horseshoe retinal tear, right eye  H33.311 Repair Retinal Breaks, Laser - OD - Right Eye  2. Diabetic macular edema of right eye with proliferative retinopathy associated with type 2 diabetes mellitus (HCC)  E11.3511 OCT, Retina - OU - Both Eyes    Color Fundus Photography Optos - OU - Both Eyes  3. Type 2 diabetes mellitus with left eye affected by proliferative retinopathy without macular edema, without long-term current use of insulin ([place])  C16.6063     1.  Patient instructed to report any new onset of profound visual acuity declines or distortions in either eye particularly if associated sparkles flashes of light floaters or curtain of darkness  2.  3.  Ophthalmic Meds Ordered this visit:  No orders of the defined types were placed in this encounter.      Return in about 4 months (around 12/10/2020) for DILATE OU, COLOR FP.  There are no Patient Instructions on file for this visit.   Explained the diagnoses, plan, and follow up with the patient and they expressed understanding.  Patient expressed understanding of the importance of proper follow up  care.   Clent Demark Hazelyn Kallen M.D. Diseases & Surgery of the Retina and Vitreous [facility] 08/11/20     Abbreviations: M myopia (nearsighted); A astigmatism; H hyperopia (farsighted); P presbyopia; Mrx spectacle prescription;  CTL contact lenses; OD right eye; OS left eye; OU both eyes  XT exotropia; ET esotropia; PEK punctate epithelial keratitis; PEE punctate epithelial erosions; DES dry eye syndrome; MGD meibomian gland dysfunction; ATs artificial tears; PFAT's preservative free artificial tears; [place] nuclear sclerotic cataract; PSC posterior subcapsular cataract; ERM epi-retinal membrane; PVD posterior vitreous detachment; RD retinal detachment; DM diabetes mellitus; DR diabetic retinopathy; NPDR non-proliferative diabetic retinopathy; PDR proliferative diabetic retinopathy; CSME clinically significant macular edema; DME diabetic macular edema; dbh dot blot hemorrhages; CWS cotton wool spot; POAG primary open angle glaucoma; C/D cup-to-disc ratio; HVF humphrey visual field; GVF goldmann visual field; OCT optical coherence tomography; IOP intraocular pressure; BRVO Branch retinal vein occlusion; CRVO central retinal vein occlusion; CRAO central retinal artery occlusion; BRAO branch retinal artery occlusion; RT retinal tear; SB scleral buckle; PPV pars plana vitrectomy; VH Vitreous hemorrhage; PRP panretinal laser photocoagulation; IVK intravitreal kenalog; VMT vitreomacular traction; MH Macular hole;  NVD neovascularization of the disc; NVE neovascularization elsewhere; AREDS age related eye disease study; ARMD age related macular degeneration; POAG primary open angle glaucoma; EBMD epithelial/anterior basement membrane dystrophy; ACIOL anterior chamber intraocular lens; IOL intraocular lens; PCIOL posterior chamber intraocular lens; Phaco/IOL phacoemulsification with  intraocular lens placement; [place] photorefractive keratectomy; LASIK laser assisted in situ keratomileusis; HTN hypertension;  DM diabetes mellitus; COPD chronic obstructive pulmonary disease

## 2020-08-11 NOTE — Assessment & Plan Note (Signed)
OD, stable status post mid peripheral PRP

## 2020-08-11 NOTE — Assessment & Plan Note (Signed)
Stable OS status post mid peripheral PRP observe

## 2020-08-11 NOTE — Assessment & Plan Note (Signed)
New horseshoe retinal tear inferotemporal OD, the need for laser retinopexy reviewed with the patient, in order to prevent progression to full-fledged retinal detachment.  Booklet demonstrated and reviewed with the patient  Risk and benefits reviewed.

## 2020-08-15 ENCOUNTER — Other Ambulatory Visit: Payer: Self-pay

## 2020-08-15 ENCOUNTER — Encounter: Payer: Self-pay | Admitting: Endocrinology

## 2020-08-15 ENCOUNTER — Ambulatory Visit (INDEPENDENT_AMBULATORY_CARE_PROVIDER_SITE_OTHER): Payer: Medicare Other | Admitting: Endocrinology

## 2020-08-15 VITALS — BP 150/78 | HR 82 | Ht 72.0 in | Wt 269.6 lb

## 2020-08-15 DIAGNOSIS — E1140 Type 2 diabetes mellitus with diabetic neuropathy, unspecified: Secondary | ICD-10-CM

## 2020-08-15 DIAGNOSIS — Z794 Long term (current) use of insulin: Secondary | ICD-10-CM | POA: Diagnosis not present

## 2020-08-15 LAB — POCT GLYCOSYLATED HEMOGLOBIN (HGB A1C): Hemoglobin A1C: 13 % — AB (ref 4.0–5.6)

## 2020-08-15 MED ORDER — NOVOLIN N FLEXPEN 100 UNIT/ML ~~LOC~~ SUPN: 100.0000 [IU] | PEN_INJECTOR | SUBCUTANEOUS | 3 refills | Status: DC

## 2020-08-15 MED ORDER — RYBELSUS 3 MG PO TABS: 1.5000 mg | ORAL_TABLET | Freq: Every day | ORAL | 3 refills | Status: DC

## 2020-08-15 NOTE — Patient Instructions (Addendum)
Your blood pressure is high today.  Please see your primary care provider soon, to have it rechecked check your blood sugar twice a day.  vary the time of day when you check, between before the 3 meals, and at bedtime.  also check if you have symptoms of your blood sugar being too high or too low.  please keep a record of the readings and bring it to your next appointment here (or you can bring the meter itself).  You can write it on any piece of paper.  please call us sooner if your blood sugar goes below 70, or if you have a lot of readings over 200.   We will need to take this complex situation in stages.   Please increase the insulin to 100 units each morning.   Here is a prescription, to add "Rybelsus."  Please start this at the beginning of next year.  To keep you out of the donut hole, we'll stay with 1/2 pill per day.    Please come back for a follow-up appointment in 2 months.

## 2020-08-15 NOTE — Progress Notes (Signed)
Subjective:    Patient ID: Andrew Pruitt, male    DOB: March 21, 1956, 64 y.o.   MRN: [id number]  HPI Pt returns for f/u of diabetes mellitus:  DM type: Insulin-requiring type 2 Dx'ed: 8916 Complications: PN, PDR, and CRI Therapy: insulin since 2005 DKA: never Severe hypoglycemia: never Pancreatitis: never Pancreatic imaging: never SDOH: he could not afford [place].  Other: she declines multiple daily injections: Lantus was changed to NPH, due to pattern of cbg's.   Interval history: he has not used continuous glucose monitor recently.  he stopped trulicity, due to being in the "donut hole." glucoses have increase tho the 300's.   pt states he feels well in general.  Past Medical History:  Diagnosis Date  . Arthritis   . Depression   . Diabetes mellitus without complication ([place])   . Hyperlipidemia   . Hypertension     Past Surgical History:  Procedure Laterality Date  . TONSILLECTOMY      Social History   Socioeconomic History  . Marital status: Married    Spouse name: Not on file  . Number of children: Not on file  . Years of education: Not on file  . Highest education level: Not on file  Occupational History  . Not on file  Tobacco Use  . Smoking status: Never Smoker  . Smokeless tobacco: Never Used  Vaping Use  . Vaping Use: Never used  Substance and Sexual Activity  . Alcohol use: Not Currently  . Drug use: Never  . Sexual activity: Yes  Other Topics Concern  . Not on file  Social History Narrative  . Not on file   Social Determinants of Health   Financial Resource Strain:   . Difficulty of Paying Living Expenses: Not on file  Food Insecurity:   . Worried About Charity fundraiser in the Last Year: Not on file  . Ran Out of Food in the Last Year: Not on file  Transportation Needs:   . Lack of Transportation (Medical): Not on file  . Lack of Transportation (Non-Medical): Not on file  Physical Activity:   . Days of Exercise per Week: Not on file  .  Minutes of Exercise per Session: Not on file  Stress:   . Feeling of Stress : Not on file  Social Connections:   . Frequency of Communication with Friends and Family: Not on file  . Frequency of Social Gatherings with Friends and Family: Not on file  . Attends Religious Services: Not on file  . Active Member of Clubs or Organizations: Not on file  . Attends Archivist Meetings: Not on file  . Marital Status: Not on file  Intimate Partner Violence:   . Fear of Current or Ex-Partner: Not on file  . Emotionally Abused: Not on file  . Physically Abused: Not on file  . Sexually Abused: Not on file    Current Outpatient Medications on File Prior to Visit  Medication Sig Dispense Refill  . amLODipine (NORVASC) 2.5 MG tablet Take 1 tablet (2.5 mg total) by mouth daily. 30 tablet 3  . B-D UF III MINI PEN NEEDLES 31G X 5 MM MISC USE DAILY 100 each 0  . hydrochlorothiazide (HYDRODIURIL) 25 MG tablet Take 1 tablet (25 mg total) by mouth daily. 30 tablet 1  . rosuvastatin (CRESTOR) 40 MG tablet Take 1 tablet (40 mg total) by mouth daily. 90 tablet 1  . sildenafil (REVATIO) 20 MG tablet Take 2-3 tablets as needed at least  30 minutes prior to desired effect. No more than in 1 dosing in a 24 hour period. 20 tablet 1  . Continuous Blood Gluc Receiver (FREESTYLE LIBRE 14 DAY READER) DEVI Use to check glucose levels TID; E11.9 (Patient not taking: Reported on 08/15/2020) 1 each 0  . Continuous Blood Gluc Sensor (FREESTYLE LIBRE 14 DAY SENSOR) MISC Use to check glucose TID. Change every 14 days. E 11.9 (Patient not taking: Reported on 08/15/2020) 6 each 3   No current facility-administered medications on file prior to visit.    Allergies  Allergen Reactions  . Niaspan [Niacin] Other (See Comments)    Red skin, red eyes    Family History  Problem Relation Age of Onset  . Cancer Mother        Uterine   . Early death Mother   . Stroke Mother   . Diabetes Father   . Heart attack Father    . Heart disease Father   . Hyperlipidemia Father   . Colon cancer Neg Hx   . Pancreatic cancer Neg Hx   . Esophageal cancer Neg Hx     BP (!) 150/78   Pulse 82   Ht 6' (1.829 m)   Wt 269 lb 9.6 oz (122.3 kg)   SpO2 97%   BMI 36.56 kg/m    Review of Systems Denies n/v    Objective:   Physical Exam VITAL SIGNS:  See vs page GENERAL: no distress Pulses: dorsalis pedis intact bilat.   MSK: no deformity of the feet CV: no leg edema Skin:  no ulcer on the feet.  normal color and temp on the feet. Neuro: sensation is intact to touch on the feet  Lab Results  Component Value Date   HGBA1C 13.0 (A) 08/15/2020       Assessment & Plan:  HTN: is noted today Insulin-requiring type 2 DM: uncontrolled SDOH: This limits rx options   Patient Instructions  Your blood pressure is high today.  Please see your primary care provider soon, to have it rechecked check your blood sugar twice a day.  vary the time of day when you check, between before the 3 meals, and at bedtime.  also check if you have symptoms of your blood sugar being too high or too low.  please keep a record of the readings and bring it to your next appointment here (or you can bring the meter itself).  You can write it on any piece of paper.  please call us sooner if your blood sugar goes below 70, or if you have a lot of readings over 200.   We will need to take this complex situation in stages.   Please increase the insulin to 100 units each morning.   Here is a prescription, to add "Rybelsus."  Please start this at the beginning of next year.  To keep you out of the donut hole, we'll stay with 1/2 pill per day.    Please come back for a follow-up appointment in 2 months.

## 2020-08-22 ENCOUNTER — Other Ambulatory Visit: Payer: Self-pay | Admitting: Physician Assistant

## 2020-09-07 ENCOUNTER — Encounter: Payer: Self-pay | Admitting: Podiatry

## 2020-09-07 ENCOUNTER — Ambulatory Visit (INDEPENDENT_AMBULATORY_CARE_PROVIDER_SITE_OTHER): Payer: Medicare Other | Admitting: Podiatry

## 2020-09-07 ENCOUNTER — Ambulatory Visit (INDEPENDENT_AMBULATORY_CARE_PROVIDER_SITE_OTHER): Payer: Medicare Other | Admitting: Ophthalmology

## 2020-09-07 ENCOUNTER — Other Ambulatory Visit: Payer: Self-pay

## 2020-09-07 DIAGNOSIS — E113592 Type 2 diabetes mellitus with proliferative diabetic retinopathy without macular edema, left eye: Secondary | ICD-10-CM | POA: Diagnosis not present

## 2020-09-07 DIAGNOSIS — E1149 Type 2 diabetes mellitus with other diabetic neurological complication: Secondary | ICD-10-CM | POA: Diagnosis not present

## 2020-09-07 DIAGNOSIS — H33311 Horseshoe tear of retina without detachment, right eye: Secondary | ICD-10-CM

## 2020-09-07 DIAGNOSIS — Q828 Other specified congenital malformations of skin: Secondary | ICD-10-CM | POA: Diagnosis not present

## 2020-09-07 DIAGNOSIS — B070 Plantar wart: Secondary | ICD-10-CM

## 2020-09-07 DIAGNOSIS — E1140 Type 2 diabetes mellitus with diabetic neuropathy, unspecified: Secondary | ICD-10-CM

## 2020-09-07 DIAGNOSIS — H2513 Age-related nuclear cataract, bilateral: Secondary | ICD-10-CM | POA: Diagnosis not present

## 2020-09-07 DIAGNOSIS — E113511 Type 2 diabetes mellitus with proliferative diabetic retinopathy with macular edema, right eye: Secondary | ICD-10-CM | POA: Diagnosis not present

## 2020-09-07 NOTE — Assessment & Plan Note (Signed)
Stable post retinopexy, no new breaks

## 2020-09-07 NOTE — Progress Notes (Signed)
This patient presents to the office today for treatment of multiple skin lesions on the bottom of both feet.  Patient has previously been diagnosed as having plantar warts both feet.  Patient is a diabetic with neuropathy.  Patient also has chronic kidney disease.  He presents the office today for treatment of these multiple skin lesions both feet.  Vascular  Dorsalis pedis and posterior tibial pulses are weakly  palpable  B/L.  Capillary return  WNL.  Temperature gradient is  WNL.  Skin turgor  WNL  Sensorium  Senn Weinstein monofilament wire  Diminished . Diminished  tactile sensation.  Nail Exam  Patient has normal nails with no evidence of bacterial or fungal infection.  Orthopedic  Exam  Muscle tone and muscle strength  WNL.  No limitations of motion feet  B/L.  No crepitus or joint effusion noted.  Foot type is unremarkable and digits show no abnormalities.  Bony prominences are unremarkable.  Skin  No open lesions.  Normal skin texture and turgor. Multiple skin lesions plantar aspect both feet. No blood noted upon debridement.  Verrucae/Porokeratosis  B/L  Debridement of the multiple skin lesions with a #15 blade and smoothing with a dermal tool.  Patient desires permanent correction.  Told patient to make an appointment with Dr.  Lilian Kapur in [place].   Possible unna thost disease.   Helane Gunther DPM

## 2020-09-07 NOTE — Assessment & Plan Note (Signed)
Angiographic neovascularization seen in the past with significant peripheral retinal nonperfusion capillary dropout, now likely stabilized status post peripheral anterior PRP.  No active neovascularization noted By examination on color fundus documentation OU

## 2020-09-07 NOTE — Progress Notes (Signed)
09/07/2020     CHIEF COMPLAINT Patient presents for Retina Follow Up (3 MO FU OU////Pt reports blurry vision OU, no pain or pressure, no new F/F////Last  A1C: 11.0 taken 07/2020////Last BS: Around 300 this AM)   HISTORY OF PRESENT ILLNESS: Andrew Pruitt is a 64 y.o. male who presents to the clinic today for:   HPI    Retina Follow Up    Patient presents with  Diabetic Retinopathy.  In both eyes.  This started 3 weeks ago.  Duration of 3 months. Additional comments: 3 MO FU OU    Pt reports blurry vision OU, no pain or pressure, no new F/F    Last  A1C: 11.0 taken 07/2020    Last BS: Around 300 this AM       Last edited by Nichola Sizer D on 09/07/2020  8:06 AM. (History)      Referring physician: Brunetta Jeans, PA-C [id number] [place],   [id number]  HISTORICAL INFORMATION:   Selected notes from the MEDICAL RECORD NUMBER    Lab Results  Component Value Date   HGBA1C 13.0 (A) 08/15/2020     CURRENT MEDICATIONS: No current outpatient medications on file. (Ophthalmic Drugs)   No current facility-administered medications for this visit. (Ophthalmic Drugs)   Current Outpatient Medications (Other)  Medication Sig  . amLODipine (NORVASC) 2.5 MG tablet Take 1 tablet (2.5 mg total) by mouth daily.  . B-D UF III MINI PEN NEEDLES 31G X 5 MM MISC USE DAILY  . Continuous Blood Gluc Receiver (FREESTYLE LIBRE 14 DAY READER) DEVI Use to check glucose levels TID; E11.9 (Patient not taking: Reported on 08/15/2020)  . Continuous Blood Gluc Sensor (FREESTYLE LIBRE 14 DAY SENSOR) MISC Use to check glucose TID. Change every 14 days. E 11.9 (Patient not taking: Reported on 08/15/2020)  . hydrochlorothiazide (HYDRODIURIL) 25 MG tablet Take 1 tablet (25 mg total) by mouth daily.  . Insulin NPH, Human,, Isophane, (NOVOLIN N FLEXPEN) 100 UNIT/ML Kiwkpen Inject 100 Units into the skin every morning.  . rosuvastatin (CRESTOR) 40 MG tablet Take 1 tablet (40 mg total)  by mouth daily.  . Semaglutide (RYBELSUS) 3 MG TABS Take 1.5 mg by mouth daily.  . sildenafil (REVATIO) 20 MG tablet Take 2-3 tablets as needed at least 30 minutes prior to desired effect. No more than in 1 dosing in a 24 hour period.  . valsartan (DIOVAN) 80 MG tablet TAKE 1 TABLET BY MOUTH EVERY DAY   No current facility-administered medications for this visit. (Other)      REVIEW OF SYSTEMS:    ALLERGIES Allergies  Allergen Reactions  . Niaspan [Niacin] Other (See Comments)    Red skin, red eyes    PAST MEDICAL HISTORY Past Medical History:  Diagnosis Date  . Arthritis   . Depression   . Diabetes mellitus without complication ([place])   . Hyperlipidemia   . Hypertension    Past Surgical History:  Procedure Laterality Date  . TONSILLECTOMY      FAMILY HISTORY Family History  Problem Relation Age of Onset  . Cancer Mother        Uterine   . Early death Mother   . Stroke Mother   . Diabetes Father   . Heart attack Father   . Heart disease Father   . Hyperlipidemia Father   . Colon cancer Neg Hx   . Pancreatic cancer Neg Hx   . Esophageal cancer Neg Hx  SOCIAL HISTORY Social History   Tobacco Use  . Smoking status: Never Smoker  . Smokeless tobacco: Never Used  Vaping Use  . Vaping Use: Never used  Substance Use Topics  . Alcohol use: Not Currently  . Drug use: Never         OPHTHALMIC EXAM:  Base Eye Exam    Visual Acuity (ETDRS)      Right Left   Dist [place] 20/40 -1 20/60 -2   Dist ph [place] 20/20 -1 20/40 -2       Tonometry (Tonopen, 8:12 AM)      Right Left   Pressure 19 18       Pupils      Pupils Dark Light Shape React APD   Right PERRL 4 3 Round Sluggish None   Left PERRL 4 3 Round Sluggish None       Visual Fields (Counting fingers)      Left Right    Full Full       Extraocular Movement      Right Left    Full Full       Neuro/Psych    Oriented x3: Yes   Mood/Affect: Normal       Dilation    Both eyes: 1.0%  Mydriacyl, 2.5% Phenylephrine @ 8:13 AM        Slit Lamp and Fundus Exam    External Exam      Right Left   External Normal Normal       Slit Lamp Exam      Right Left   Lids/Lashes Normal Normal   Conjunctiva/Sclera White and quiet White and quiet   Cornea Clear Clear   Anterior Chamber Deep and quiet Deep and quiet   Iris Round and reactive Round and reactive   Lens 2+ Nuclear sclerosis 1+ Nuclear sclerosis   Anterior Vitreous no cells no cells       Fundus Exam      Right Left   Posterior Vitreous Posterior vitreous detachment Normal   Disc Normal Normal   C/D Ratio 0.4 0.45   Macula Microaneurysms,  macular thickening, with hard exudates. Microaneurysms, no macular thickening   Vessels NPDR-Severe NPDR-Severe   Periphery horseshoe retinal tear 7:00 with good retinopexy surrounding Normal          IMAGING AND PROCEDURES  Imaging and Procedures for 09/07/20  Color Fundus Photography Optos - OU - Both Eyes       Right Eye Progression has been stable. Disc findings include normal observations. Macula : microaneurysms. Vessels : IRMA. Periphery : tear.   Left Eye Progression has been stable. Disc findings include normal observations. Macula : microaneurysms.   Notes OD with good retinopexy 7:00 meridian, no new breaks.  Severe NPDR stable OU.  Good PRP peripherally anteriorly OU, stable                ASSESSMENT/PLAN:  Horseshoe retinal tear, right eye Stable post retinopexy, no new breaks  Type 2 diabetes mellitus with proliferative diabetic retinopathy of left eye without macular edema (HCC) Angiographic neovascularization seen in the past with significant peripheral retinal nonperfusion capillary dropout, now likely stabilized status post peripheral anterior PRP.  No active neovascularization noted By examination on color fundus documentation OU  Nuclear sclerotic cataract of both eyes The nature of cataract was discussed with the patient as well  as the elective nature of surgery. The patient was reassured that surgery at a later date does not put the  patient at risk for a worse outcome. It was emphasized that the need for surgery is dictated by the patient's quality of life as influenced by the cataract. Patient was instructed to maintain close follow up with their general eye care doctor.  Blurred vision OS today, most notably from progression of nuclear sclerotic changes with 20/60 vision noted without distance glasses in 2040 pinhole, residual visual acuity is from the the cataract opacities self  I have suggested patient follow-up with local optometrist and/or ophthalmologist.  Near the location would be Yvette Rack, OD [facility]      ICD-10-CM   1. Type 2 diabetes mellitus with left eye affected by proliferative retinopathy without macular edema, without long-term current use of insulin (HCC)  J85.6314 Color Fundus Photography Optos - OU - Both Eyes  2. Diabetic macular edema of right eye with proliferative retinopathy associated with type 2 diabetes mellitus (HCC)  H70.2637 Color Fundus Photography Optos - OU - Both Eyes  3. Horseshoe retinal tear, right eye  H33.311   4. Nuclear sclerotic cataract of both eyes  H25.13     1.  OD, stable status post retinopexy for retinal tear inferiorly  2.  OU, early proliferative diabetic retinopathy determined only by angiographic findings in the past, now likely stabilized with peripheral anterior PRP will continue to monitor  3.  Ophthalmic Meds Ordered this visit:  No orders of the defined types were placed in this encounter.      Return in about 4 months (around 01/06/2021) for DILATE OU, OCT.  There are no Patient Instructions on file for this visit.   Explained the diagnoses, plan, and follow up with the patient and they expressed understanding.  Patient expressed understanding of the importance of proper follow up care.   Alford Highland Heba Ige M.D. Diseases &  Surgery of the Retina and Vitreous [facility] 09/07/20     Abbreviations: M myopia (nearsighted); A astigmatism; H hyperopia (farsighted); P presbyopia; Mrx spectacle prescription;  CTL contact lenses; OD right eye; OS left eye; OU both eyes  XT exotropia; ET esotropia; PEK punctate epithelial keratitis; PEE punctate epithelial erosions; DES dry eye syndrome; MGD meibomian gland dysfunction; ATs artificial tears; PFAT's preservative free artificial tears; NSC nuclear sclerotic cataract; PSC posterior subcapsular cataract; ERM epi-retinal membrane; PVD posterior vitreous detachment; RD retinal detachment; DM diabetes mellitus; DR diabetic retinopathy; NPDR non-proliferative diabetic retinopathy; PDR proliferative diabetic retinopathy; CSME clinically significant macular edema; DME diabetic macular edema; dbh dot blot hemorrhages; CWS cotton wool spot; POAG primary open angle glaucoma; C/D cup-to-disc ratio; HVF humphrey visual field; GVF goldmann visual field; OCT optical coherence tomography; IOP intraocular pressure; BRVO Branch retinal vein occlusion; CRVO central retinal vein occlusion; CRAO central retinal artery occlusion; BRAO branch retinal artery occlusion; RT retinal tear; SB scleral buckle; PPV pars plana vitrectomy; VH Vitreous hemorrhage; PRP panretinal laser photocoagulation; IVK intravitreal kenalog; VMT vitreomacular traction; MH Macular hole;  NVD neovascularization of the disc; NVE neovascularization elsewhere; AREDS age related eye disease study; ARMD age related macular degeneration; POAG primary open angle glaucoma; EBMD epithelial/anterior basement membrane dystrophy; ACIOL anterior chamber intraocular lens; IOL intraocular lens; PCIOL posterior chamber intraocular lens; Phaco/IOL phacoemulsification with intraocular lens placement; PRK photorefractive keratectomy; LASIK laser assisted in situ keratomileusis; HTN hypertension; DM diabetes mellitus; COPD chronic  obstructive pulmonary disease

## 2020-09-07 NOTE — Assessment & Plan Note (Signed)
The nature of cataract was discussed with the patient as well as the elective nature of surgery. The patient was reassured that surgery at a later date does not put the patient at risk for a worse outcome. It was emphasized that the need for surgery is dictated by the patient's quality of life as influenced by the cataract. Patient was instructed to maintain close follow up with their general eye care doctor.  Blurred vision OS today, most notably from progression of nuclear sclerotic changes with 20/60 vision noted without distance glasses in 2040 pinhole, residual visual acuity is from the the cataract opacities self  I have suggested patient follow-up with local optometrist and/or ophthalmologist.  Near the location would be Yvette Rack, OD [facility]

## 2020-09-14 ENCOUNTER — Other Ambulatory Visit: Payer: Self-pay | Admitting: Physician Assistant

## 2020-09-20 ENCOUNTER — Other Ambulatory Visit: Payer: Self-pay | Admitting: Physician Assistant

## 2020-09-25 ENCOUNTER — Other Ambulatory Visit: Payer: Self-pay | Admitting: Physician Assistant

## 2020-10-13 ENCOUNTER — Other Ambulatory Visit: Payer: Self-pay | Admitting: Physician Assistant

## 2020-10-19 ENCOUNTER — Other Ambulatory Visit: Payer: Self-pay

## 2020-10-19 ENCOUNTER — Ambulatory Visit (INDEPENDENT_AMBULATORY_CARE_PROVIDER_SITE_OTHER): Payer: Medicare Other | Admitting: Endocrinology

## 2020-10-19 ENCOUNTER — Encounter: Payer: Self-pay | Admitting: Endocrinology

## 2020-10-19 VITALS — BP 130/62 | HR 83 | Ht 72.0 in | Wt 272.4 lb

## 2020-10-19 DIAGNOSIS — Z794 Long term (current) use of insulin: Secondary | ICD-10-CM | POA: Diagnosis not present

## 2020-10-19 DIAGNOSIS — E1140 Type 2 diabetes mellitus with diabetic neuropathy, unspecified: Secondary | ICD-10-CM | POA: Diagnosis not present

## 2020-10-19 LAB — POCT GLYCOSYLATED HEMOGLOBIN (HGB A1C): Hemoglobin A1C: 15 % — AB (ref 4.0–5.6)

## 2020-10-19 MED ORDER — NOVOLIN N FLEXPEN 100 UNIT/ML ~~LOC~~ SUPN: 120.0000 [IU] | PEN_INJECTOR | SUBCUTANEOUS | 3 refills | Status: DC

## 2020-10-19 NOTE — Patient Instructions (Addendum)
check your blood sugar twice a day.  vary the time of day when you check, between before the 3 meals, and at bedtime.  also check if you have symptoms of your blood sugar being too high or too low.  please keep a record of the readings and bring it to your next appointment here (or you can bring the meter itself).  You can write it on any piece of paper.  please call us sooner if your blood sugar goes below 70, or if you have a lot of readings over 200.   We will need to take this complex situation in stages.   Please increase the insulin to 120 units each morning.    Please come back for a follow-up appointment in 1 month.

## 2020-10-19 NOTE — Progress Notes (Signed)
Subjective:    Patient ID: Andrew Pruitt, male    DOB: [birth year], 65 y.o.   MRN: [id number]  HPI Pt returns for f/u of diabetes mellitus:  DM type: Insulin-requiring type 2 Dx'ed: 5170 Complications: PN, PDR, and CRI Therapy: insulin since 2005 DKA: never Severe hypoglycemia: never Pancreatitis: never Pancreatic imaging: never SDOH: he could not afford [place] or Rybelsus   Other: he declines multiple daily injections: Lantus was changed to NPH, due to pattern of cbg's.   Interval history: I reviewed continuous glucose monitor data.  Glucose varies from 250-500.  It is in general lowest in the afternoon, and highest at HS.   pt states he feels well in general.  He stopped Rybelsus, due to cost.  He takes just 80 units qam.  Pt says there is no problem with injecting insulin.   Past Medical History:  Diagnosis Date  . Arthritis   . Depression   . Diabetes mellitus without complication ([place])   . Hyperlipidemia   . Hypertension     Past Surgical History:  Procedure Laterality Date  . TONSILLECTOMY      Social History   Socioeconomic History  . Marital status: Married    Spouse name: Not on file  . Number of children: Not on file  . Years of education: Not on file  . Highest education level: Not on file  Occupational History  . Not on file  Tobacco Use  . Smoking status: Never Smoker  . Smokeless tobacco: Never Used  Vaping Use  . Vaping Use: Never used  Substance and Sexual Activity  . Alcohol use: Not Currently  . Drug use: Never  . Sexual activity: Yes  Other Topics Concern  . Not on file  Social History Narrative  . Not on file   Social Determinants of Health   Financial Resource Strain: Not on file  Food Insecurity: Not on file  Transportation Needs: Not on file  Physical Activity: Not on file  Stress: Not on file  Social Connections: Not on file  Intimate Partner Violence: Not on file    Current Outpatient Medications on File Prior to Visit   Medication Sig Dispense Refill  . amLODipine (NORVASC) 2.5 MG tablet TAKE 1 TABLET BY MOUTH DAILY 90 tablet 0  . B-D UF III MINI PEN NEEDLES 31G X 5 MM MISC USE DAILY 100 each 0  . Continuous Blood Gluc Receiver (FREESTYLE LIBRE 14 DAY READER) DEVI Use to check glucose levels TID; E11.9 1 each 0  . Continuous Blood Gluc Sensor (FREESTYLE LIBRE 14 DAY SENSOR) MISC Use to check glucose TID. Change every 14 days. E 11.9 6 each 3  . hydrochlorothiazide (HYDRODIURIL) 25 MG tablet TAKE 1 TABLET BY MOUTH EVERY DAY 90 tablet 0  . rosuvastatin (CRESTOR) 40 MG tablet Take 1 tablet (40 mg total) by mouth daily. 90 tablet 1  . sildenafil (REVATIO) 20 MG tablet Take 2-3 tablets as needed at least 30 minutes prior to desired effect. No more than in 1 dosing in a 24 hour period. 20 tablet 1  . valsartan (DIOVAN) 80 MG tablet TAKE 1 TABLET BY MOUTH EVERY DAY 30 tablet 1   No current facility-administered medications on file prior to visit.    Allergies  Allergen Reactions  . Niaspan [Niacin] Other (See Comments)    Red skin, red eyes    Family History  Problem Relation Age of Onset  . Cancer Mother        Uterine   .  Early death Mother   . Stroke Mother   . Diabetes Father   . Heart attack Father   . Heart disease Father   . Hyperlipidemia Father   . Colon cancer Neg Hx   . Pancreatic cancer Neg Hx   . Esophageal cancer Neg Hx     BP 130/62 (BP Location: Right Arm, Patient Position: Sitting, Cuff Size: Large)   Pulse 83   Ht 6' (1.829 m)   Wt 272 lb 6.4 oz (123.6 kg)   SpO2 95%   BMI 36.94 kg/m    Review of Systems He denies hypoglycemia.      Objective:   Physical Exam VITAL SIGNS:  See vs page GENERAL: no distress Pulses: dorsalis pedis intact bilat.   MSK: no deformity of the feet CV: no leg edema Skin:  no ulcer on the feet.  normal color and temp on the feet. Neuro: sensation is intact to touch on the feet.   Lab Results  Component Value Date   HGBA1C 15.0 (A)  10/19/2020       Assessment & Plan:  Insulin-requiring type 2 DM, with CRI: very poor glycemic control--usually due to noncompliance.    Patient Instructions  check your blood sugar twice a day.  vary the time of day when you check, between before the 3 meals, and at bedtime.  also check if you have symptoms of your blood sugar being too high or too low.  please keep a record of the readings and bring it to your next appointment here (or you can bring the meter itself).  You can write it on any piece of paper.  please call us sooner if your blood sugar goes below 70, or if you have a lot of readings over 200.   We will need to take this complex situation in stages.   Please increase the insulin to 120 units each morning.    Please come back for a follow-up appointment in 1 month.

## 2020-11-15 ENCOUNTER — Other Ambulatory Visit: Payer: Self-pay | Admitting: Physician Assistant

## 2020-11-23 ENCOUNTER — Ambulatory Visit (INDEPENDENT_AMBULATORY_CARE_PROVIDER_SITE_OTHER): Payer: Medicare Other | Admitting: Endocrinology

## 2020-11-23 ENCOUNTER — Other Ambulatory Visit: Payer: Self-pay

## 2020-11-23 VITALS — BP 140/60 | HR 78 | Ht 72.0 in | Wt 285.8 lb

## 2020-11-23 DIAGNOSIS — E113592 Type 2 diabetes mellitus with proliferative diabetic retinopathy without macular edema, left eye: Secondary | ICD-10-CM

## 2020-11-23 DIAGNOSIS — E113511 Type 2 diabetes mellitus with proliferative diabetic retinopathy with macular edema, right eye: Secondary | ICD-10-CM

## 2020-11-23 MED ORDER — NOVOLIN N FLEXPEN 100 UNIT/ML ~~LOC~~ SUPN: 150.0000 [IU] | PEN_INJECTOR | SUBCUTANEOUS | 3 refills | Status: DC

## 2020-11-23 NOTE — Progress Notes (Signed)
Subjective:    Patient ID: Andrew Pruitt, male    DOB: Aug 19, 1956, 65 y.o.   MRN: [id number]  HPI Pt returns for f/u of diabetes mellitus:  DM type: Insulin-requiring type 2 Dx'ed: 2542 Complications: PN, PDR, and CRI Therapy: insulin since 2005 DKA: never Severe hypoglycemia: never Pancreatitis: never Pancreatic imaging: never SDOH: he could not afford Farxiga or Rybelsus.  Other: he declines multiple daily injections: Lantus was changed to NPH, due to pattern of cbg's.   Interval history: I reviewed continuous glucose monitor data.  Glucose varies from 170-500.  It is in general lowest at 12N, and highest at HS.   pt states he feels well in general.  Pt says there is no problem with injecting insulin.   Past Medical History:  Diagnosis Date  . Arthritis   . Depression   . Diabetes mellitus without complication ([place])   . Hyperlipidemia   . Hypertension     Past Surgical History:  Procedure Laterality Date  . TONSILLECTOMY      Social History   Socioeconomic History  . Marital status: Married    Spouse name: Not on file  . Number of children: Not on file  . Years of education: Not on file  . Highest education level: Not on file  Occupational History  . Not on file  Tobacco Use  . Smoking status: Never Smoker  . Smokeless tobacco: Never Used  Vaping Use  . Vaping Use: Never used  Substance and Sexual Activity  . Alcohol use: Not Currently  . Drug use: Never  . Sexual activity: Yes  Other Topics Concern  . Not on file  Social History Narrative  . Not on file   Social Determinants of Health   Financial Resource Strain: Not on file  Food Insecurity: Not on file  Transportation Needs: Not on file  Physical Activity: Not on file  Stress: Not on file  Social Connections: Not on file  Intimate Partner Violence: Not on file    Current Outpatient Medications on File Prior to Visit  Medication Sig Dispense Refill  . amLODipine (NORVASC) 2.5 MG tablet Take 1  tablet (2.5 mg total) by mouth daily. Further refills will need to come from new primary care provider 90 tablet 0  . B-D UF III MINI PEN NEEDLES 31G X 5 MM MISC USE DAILY 100 each 0  . Continuous Blood Gluc Receiver (FREESTYLE LIBRE 14 DAY READER) DEVI Use to check glucose levels TID; E11.9 1 each 0  . Continuous Blood Gluc Sensor (FREESTYLE LIBRE 14 DAY SENSOR) MISC Use to check glucose TID. Change every 14 days. E 11.9 6 each 3  . hydrochlorothiazide (HYDRODIURIL) 25 MG tablet TAKE 1 TABLET BY MOUTH EVERY DAY 90 tablet 0  . rosuvastatin (CRESTOR) 40 MG tablet Take 1 tablet (40 mg total) by mouth daily. 90 tablet 1  . sildenafil (REVATIO) 20 MG tablet Take 2-3 tablets as needed at least 30 minutes prior to desired effect. No more than in 1 dosing in a 24 hour period. 20 tablet 1  . valsartan (DIOVAN) 80 MG tablet TAKE 1 TABLET BY MOUTH EVERY DAY 30 tablet 1   No current facility-administered medications on file prior to visit.    Allergies  Allergen Reactions  . Niaspan [Niacin] Other (See Comments)    Red skin, red eyes    Family History  Problem Relation Age of Onset  . Cancer Mother        Uterine   .  Early death Mother   . Stroke Mother   . Diabetes Father   . Heart attack Father   . Heart disease Father   . Hyperlipidemia Father   . Colon cancer Neg Hx   . Pancreatic cancer Neg Hx   . Esophageal cancer Neg Hx     BP 140/60 (BP Location: Right Arm, Patient Position: Sitting, Cuff Size: Large)   Pulse 78   Ht 6' (1.829 m)   Wt 285 lb 12.8 oz (129.6 kg)   SpO2 97%   BMI 38.76 kg/m    Review of Systems     Objective:   Physical Exam VITAL SIGNS:  See vs page GENERAL: no distress Pulses: dorsalis pedis intact bilat.   MSK: no deformity of the feet CV: no leg edema Skin:  no ulcer on the feet.  normal color and temp on the feet.  Several white papules on the feet (has podiatry appt).  Neuro: sensation is intact to touch on the feet.    Lab Results  Component  Value Date   CREATININE 1.26 02/11/2020   BUN 22 02/11/2020   NA 133 (L) 02/11/2020   K 4.6 02/11/2020   CL 96 02/11/2020   CO2 29 02/11/2020       Assessment & Plan:  Insulin-requiring type 2 DM, with stage 3 CRI: uncontrolled.   Patient Instructions  check your blood sugar twice a day.  vary the time of day when you check, between before the 3 meals, and at bedtime.  also check if you have symptoms of your blood sugar being too high or too low.  please keep a record of the readings and bring it to your next appointment here (or you can bring the meter itself).  You can write it on any piece of paper.  please call us sooner if your blood sugar goes below 70, or if you have a lot of readings over 200.   We will need to take this complex situation in stages.   Please increase the insulin to 150 units each morning.    Please come back for a follow-up appointment in 1 month.

## 2020-11-23 NOTE — Patient Instructions (Addendum)
check your blood sugar twice a day.  vary the time of day when you check, between before the 3 meals, and at bedtime.  also check if you have symptoms of your blood sugar being too high or too low.  please keep a record of the readings and bring it to your next appointment here (or you can bring the meter itself).  You can write it on any piece of paper.  please call us sooner if your blood sugar goes below 70, or if you have a lot of readings over 200.   We will need to take this complex situation in stages.   Please increase the insulin to 150 units each morning.    Please come back for a follow-up appointment in 1 month.

## 2020-12-05 ENCOUNTER — Ambulatory Visit: Payer: Medicare Other | Admitting: Nurse Practitioner

## 2020-12-07 ENCOUNTER — Other Ambulatory Visit: Payer: Self-pay

## 2020-12-07 ENCOUNTER — Encounter: Payer: Self-pay | Admitting: Podiatry

## 2020-12-07 ENCOUNTER — Ambulatory Visit (INDEPENDENT_AMBULATORY_CARE_PROVIDER_SITE_OTHER): Payer: Medicare Other | Admitting: Podiatry

## 2020-12-07 DIAGNOSIS — D2371 Other benign neoplasm of skin of right lower limb, including hip: Secondary | ICD-10-CM

## 2020-12-07 DIAGNOSIS — M79675 Pain in left toe(s): Secondary | ICD-10-CM | POA: Diagnosis not present

## 2020-12-07 DIAGNOSIS — E1149 Type 2 diabetes mellitus with other diabetic neurological complication: Secondary | ICD-10-CM

## 2020-12-07 DIAGNOSIS — L852 Keratosis punctata (palmaris et plantaris): Secondary | ICD-10-CM

## 2020-12-07 DIAGNOSIS — B351 Tinea unguium: Secondary | ICD-10-CM | POA: Diagnosis not present

## 2020-12-07 DIAGNOSIS — D2370 Other benign neoplasm of skin of unspecified lower limb, including hip: Secondary | ICD-10-CM

## 2020-12-07 DIAGNOSIS — E1140 Type 2 diabetes mellitus with diabetic neuropathy, unspecified: Secondary | ICD-10-CM | POA: Diagnosis not present

## 2020-12-07 DIAGNOSIS — M79674 Pain in right toe(s): Secondary | ICD-10-CM | POA: Diagnosis not present

## 2020-12-07 NOTE — Patient Instructions (Addendum)
Use salicylic acid ointment daily with a pumice stone to soften and get the lesions down   Will send referral to [facility]  [place], [place] [address]  Phone: [phone] Fax: [phone]

## 2020-12-07 NOTE — Progress Notes (Signed)
  Subjective:  Patient ID: Andrew Pruitt, male    DOB: 10/28/1955,  MRN: [id number]  Chief Complaint  Patient presents with  . Nail Problem  . Callouses  . Diabetes    Nail trim DFC, Patient c/o multiple callous lesions bottom of feet bilat    65 y.o. male returns with the above complaint. History confirmed with patient.  He is referred to me by Dr. Prudence Davidson for evaluation of multiple skin lesions on both feet.  He has diabetes which he says is well controlled.  He also complains of thickened elongated yellow toenails  Objective:  Physical Exam: warm, good capillary refill, no trophic changes or ulcerative lesions, normal DP and PT pulses and normal sensory exam.  Thickened elongated yellow toenails x10.  He has numerous (34 in total) punctate skin lesions which are benign-appearing spread throughout the plantar foot which are painful to palpation  Assessment:   1. Benign neoplasm of skin of lower extremity, unspecified laterality   2. Keratosis punctata (palmaris et plantaris)   3. Diabetic neuropathy with neurologic complication (HCC)   4. Pain due to onychomycosis of toenails of both feet      Plan:  Patient was evaluated and treated and all questions answered.  Reviewed etiology and treatment options for these punctate lesions.  With this many I do not know if this is a more diffuse systemic skin condition or genetic abnormality.  This is beyond what I usually treat and I think a dermatology evaluation would be prudent to see if there is any further testing required or treatment strategies.  Consider referral to [facility].  Today I debrided each lesion and applied salicylic acid.  I encouraged him to use salicylic acid ointment and a pumice stone or shaver at home to keep things at [place].  Discussed the etiology and treatment options for the condition in detail with the patient. Educated patient on the topical and oral treatment options for mycotic nails. Recommended  debridement of the nails today. Sharp and mechanical debridement performed of all painful and mycotic nails today. Nails debrided in length and thickness using a nail nipper to level of comfort. Discussed treatment options including appropriate shoe gear. Follow up as needed for painful nails.   Return if symptoms worsen or fail to improve.

## 2020-12-08 ENCOUNTER — Other Ambulatory Visit: Payer: Self-pay | Admitting: Physician Assistant

## 2020-12-20 NOTE — Progress Notes (Signed)
BP (!) 150/81   Pulse 83   Temp (!) 97.4 F (36.3 C)   Ht 5' 10.43" (1.789 m)   Wt 288 lb 6 oz (130.8 kg)   SpO2 95%   BMI 40.87 kg/m    Subjective:    Patient ID: Andrew Pruitt, male    DOB: [birth year], 65 y.o.   MRN: [id number]  HPI: Andrew Pruitt is a 65 y.o. male  Chief Complaint  Patient presents with  . Establish Care   Patient presents to clinic to establish care with new PCP.  Patient has a history of hypertension, DM 2, diabetic retinopathy, HLD, and plantar wars.  His previous PCP left the practice so he is establishing care here.  Patient also see's endocrinology who is managing his DM 2.    HYPERTENSION / [place] Patient was recently changed from [place] for blood pressure control due to cough.  He is now taking Amlodipine 2.5mg .  States cough has resolved.  Satisfied with current treatment? yes Duration of hypertension: years BP monitoring frequency: daily BP range: 150-170/80 BP medication side effects: no Past BP meds: amlodipine and HCTZ Duration of hyperlipidemia: years Cholesterol medication side effects: no Cholesterol supplements: none Past cholesterol medications: rosuvastatin (crestor) Medication compliance: excellent compliance Aspirin: no Recent stressors: no Recurrent headaches: no Visual changes: no Palpitations: no Dyspnea: no Chest pain: no Lower extremity edema: no Dizzy/lightheaded: no  DIABETES Patient was on Trulicity in the past but it became too expensive for him.  He has never tried Ozempic. Hypoglycemic episodes:no Polydipsia/polyuria: no Visual disturbance: no Chest pain: no Paresthesias: no Glucose Monitoring: yes  Accucheck frequency: uses the libre2  Fasting glucose:   Post prandial:  Evening:  Before meals: Taking Insulin?: yes  Long acting insulin: 150u all at one time.-- prescribed by Endo  Short acting insulin: Blood Pressure Monitoring: daily Retinal Examination: Up to Date Foot Exam: Up to  Date Diabetic Education: Not Completed Pneumovax: Up to Date Influenza: Up to Date Aspirin: no  ARTHRITIS Patient states he has a lot of bilateral hand pain.  His hands are stiff and feel like needles stick in his hands. Patient states it is also in his hips. Patient has neuropathy.  Has not tried any medications for the pain.   Relevant past medical, surgical, family and social history reviewed and updated as indicated. Interim medical history since our last visit reviewed. Allergies and medications reviewed and updated.  Review of Systems  Eyes: Negative for visual disturbance.  Respiratory: Negative for chest tightness and shortness of breath.   Cardiovascular: Negative for chest pain, palpitations and leg swelling.  Endocrine: Negative for polydipsia and polyuria.  Neurological: Positive for numbness. Negative for dizziness, light-headedness and headaches.    Per HPI unless specifically indicated above     Objective:    BP (!) 150/81   Pulse 83   Temp (!) 97.4 F (36.3 C)   Ht 5' 10.43" (1.789 m)   Wt 288 lb 6 oz (130.8 kg)   SpO2 95%   BMI 40.87 kg/m   Wt Readings from Last 3 Encounters:  12/21/20 288 lb 6 oz (130.8 kg)  11/23/20 285 lb 12.8 oz (129.6 kg)  10/19/20 272 lb 6.4 oz (123.6 kg)    Physical Exam Vitals and nursing note reviewed.  Constitutional:      General: He is not in acute distress.    Appearance: Normal appearance. He is obese. He is not ill-appearing, toxic-appearing or diaphoretic.  HENT:  Head: Normocephalic.     Right Ear: External ear normal.     Left Ear: External ear normal.     Nose: Nose normal. No congestion or rhinorrhea.     Mouth/Throat:     Mouth: Mucous membranes are moist.  Eyes:     General:        Right eye: No discharge.        Left eye: No discharge.     Extraocular Movements: Extraocular movements intact.     Conjunctiva/sclera: Conjunctivae normal.     Pupils: Pupils are equal, round, and reactive to light.   Cardiovascular:     Rate and Rhythm: Normal rate and regular rhythm.     Heart sounds: No murmur heard.   Pulmonary:     Effort: Pulmonary effort is normal. No respiratory distress.     Breath sounds: Normal breath sounds. No wheezing, rhonchi or rales.  Abdominal:     General: Abdomen is flat. Bowel sounds are normal.  Musculoskeletal:     Cervical back: Normal range of motion and neck supple.  Skin:    General: Skin is warm and dry.     Capillary Refill: Capillary refill takes less than 2 seconds.  Neurological:     General: No focal deficit present.     Mental Status: He is alert and oriented to person, place, and time.  Psychiatric:        Mood and Affect: Mood normal.        Behavior: Behavior normal.        Thought Content: Thought content normal.        Judgment: Judgment normal.     Results for orders placed or performed in visit on 10/19/20  POCT glycosylated hemoglobin (Hb A1C)  Result Value Ref Range   Hemoglobin A1C 15.0 (A) 4.0 - 5.6 %   HbA1c POC (<> result, manual entry)     HbA1c, POC (prediabetic range)     HbA1c, POC (controlled diabetic range)        Assessment & Plan:   Problem List Items Addressed This Visit      Cardiovascular and Mediastinum   Hypertension associated with diabetes ([place]) - Primary    Chronic.  Uncontrolled on Amlodipine 2.5mg  and HCTZ 25mg .  Will increase Amlodipine to 5mg  daily.  Continue to check blood pressures at home daily.  Bring log to next visit. Follow up in 1 month.      Relevant Medications   amLODipine (NORVASC) 5 MG tablet   Other Relevant Orders   AMB Referral to [facility] Coordinaton     Endocrine   Diabetic neuropathy associated with type 2 diabetes mellitus (HCC)    Chronic.  Worsening.  Advised patient that neuropathy will continue to worsen if A1c remains not well controlled.  Begin Gabapentin 100mg  daily.  Can take TID if able to tolerate.  Side effects and benefits of medication discussed with  patient during visit today.  Voltaren Gel sent for patient to use PRN for pain.  Return in 1 month for reevaluation.  Can increase dose of Gabapentin if needed.        Uncontrolled type II diabetes mellitus with chronic kidney disease (HCC)    Chronic.  Uncontrolled.  Last A1c was 11.7 seven months ago.  Patient is followed by Endocrinology and currently taking 150u of Insulin daily.  Uses the [employer]. Referral placed for CCM.  Patient has not tried Ozempic.  Continue to follow up with Endocrinology and  follow their recommendations.  Will draw labs at next visit.       Relevant Orders   [facility] Ref[facility] to [facility]   Hyperlipidemia associated with type 2 diabetes mellitus (HCC)    Chronic.  Continue with Crestor.  Will draw labs at next visit.        Relevant Orders   AMB Referral to [facility] Coordinaton   Proliferative diabetic retinopathy of right eye ([place])    Chronic.  Controlled.  Continue to see Ophthalmology and follow their recommendations.       Type 2 diabetes mellitus with proliferative diabetic retinopathy of left eye without macular edema (HCC)    Chronic.  Controlled.  Continue to see Ophthalmology and follow their recommendations.       Diabetic macular edema of right eye with proliferative retinopathy associated with type 2 diabetes mellitus (HCC)    Chronic.  Controlled.  Continue to see Ophthalmology and follow their recommendations.        Other Visit Diagnoses    Encounter to establish care           Follow up plan: Return in about 1 month (around 01/20/2021) for BP Check and Neuropathy check.

## 2020-12-21 ENCOUNTER — Other Ambulatory Visit: Payer: Self-pay

## 2020-12-21 ENCOUNTER — Ambulatory Visit (INDEPENDENT_AMBULATORY_CARE_PROVIDER_SITE_OTHER): Payer: Medicare Other | Admitting: Nurse Practitioner

## 2020-12-21 ENCOUNTER — Encounter: Payer: Self-pay | Admitting: Nurse Practitioner

## 2020-12-21 VITALS — BP 150/81 | HR 83 | Temp 97.4°F | Ht 70.43 in | Wt 288.4 lb

## 2020-12-21 DIAGNOSIS — E113591 Type 2 diabetes mellitus with proliferative diabetic retinopathy without macular edema, right eye: Secondary | ICD-10-CM

## 2020-12-21 DIAGNOSIS — Z7689 Persons encountering health services in other specified circumstances: Secondary | ICD-10-CM

## 2020-12-21 DIAGNOSIS — E1169 Type 2 diabetes mellitus with other specified complication: Secondary | ICD-10-CM | POA: Diagnosis not present

## 2020-12-21 DIAGNOSIS — Z794 Long term (current) use of insulin: Secondary | ICD-10-CM

## 2020-12-21 DIAGNOSIS — I152 Hypertension secondary to endocrine disorders: Secondary | ICD-10-CM

## 2020-12-21 DIAGNOSIS — E1122 Type 2 diabetes mellitus with diabetic chronic kidney disease: Secondary | ICD-10-CM

## 2020-12-21 DIAGNOSIS — E1159 Type 2 diabetes mellitus with other circulatory complications: Secondary | ICD-10-CM

## 2020-12-21 DIAGNOSIS — E113592 Type 2 diabetes mellitus with proliferative diabetic retinopathy without macular edema, left eye: Secondary | ICD-10-CM

## 2020-12-21 DIAGNOSIS — E113511 Type 2 diabetes mellitus with proliferative diabetic retinopathy with macular edema, right eye: Secondary | ICD-10-CM | POA: Diagnosis not present

## 2020-12-21 DIAGNOSIS — E1165 Type 2 diabetes mellitus with hyperglycemia: Secondary | ICD-10-CM

## 2020-12-21 DIAGNOSIS — IMO0002 Reserved for concepts with insufficient information to code with codable children: Secondary | ICD-10-CM

## 2020-12-21 DIAGNOSIS — E1140 Type 2 diabetes mellitus with diabetic neuropathy, unspecified: Secondary | ICD-10-CM

## 2020-12-21 DIAGNOSIS — E785 Hyperlipidemia, unspecified: Secondary | ICD-10-CM

## 2020-12-21 MED ORDER — AMLODIPINE BESYLATE 5 MG PO TABS: 5.0000 mg | ORAL_TABLET | Freq: Every day | ORAL | 0 refills | Status: DC

## 2020-12-21 MED ORDER — DICLOFENAC SODIUM 1 % EX GEL: 2.0000 g | Freq: Four times a day (QID) | CUTANEOUS | 1 refills | Status: DC

## 2020-12-21 MED ORDER — GABAPENTIN 100 MG PO CAPS: 100.0000 mg | ORAL_CAPSULE | Freq: Three times a day (TID) | ORAL | 0 refills | Status: AC

## 2020-12-21 NOTE — Assessment & Plan Note (Signed)
Chronic.  Controlled.  Continue to see Ophthalmology and follow their recommendations.

## 2020-12-21 NOTE — Assessment & Plan Note (Signed)
Chronic.  Uncontrolled on Amlodipine 2.5mg  and HCTZ 25mg .  Will increase Amlodipine to 5mg  daily.  Continue to check blood pressures at home daily.  Bring log to next visit. Follow up in 1 month.

## 2020-12-21 NOTE — Assessment & Plan Note (Signed)
Chronic.  Worsening.  Advised patient that neuropathy will continue to worsen if A1c remains not well controlled.  Begin Gabapentin 100mg  daily.  Can take TID if able to tolerate.  Side effects and benefits of medication discussed with patient during visit today.  Voltaren Gel sent for patient to use PRN for pain.  Return in 1 month for reevaluation.  Can increase dose of Gabapentin if needed.

## 2020-12-21 NOTE — Assessment & Plan Note (Signed)
Chronic.  Continue with Crestor.  Will draw labs at next visit.

## 2020-12-21 NOTE — Assessment & Plan Note (Deleted)
Chronic.  Continue with Crestor.  Will draw labs at next visit.

## 2020-12-21 NOTE — Assessment & Plan Note (Addendum)
Chronic.  Uncontrolled.  Last A1c was 11.7 seven months ago.  Patient is followed by Endocrinology and currently taking 150u of Insulin daily.  Uses the [employer]. Referral placed for CCM.  Patient has not tried Ozempic.  Continue to follow up with Endocrinology and follow their recommendations.  Will draw labs at next visit.

## 2020-12-27 ENCOUNTER — Telehealth: Payer: Self-pay

## 2020-12-27 NOTE — Chronic Care Management (AMB) (Signed)
  Chronic Care Management   Outreach Note  12/27/2020 Name: Clary Meeker MRN: [id number] DOB: February 24, 1956  Jaydeen Odor is a 65 y.o. year old male who is a primary care patient of Jon Billings, NP. I reached out to [name] by phone today in response to a referral sent by Mr. Czar Munford's PCP, Jon Billings, NP     A second unsuccessful telephone outreach was attempted today. The patient was referred to the case management team for assistance with care management and care coordination.   Follow Up Plan: A HIPAA compliant phone message was left for the patient providing contact information and requesting a return call.  The care management team will reach out to the patient again over the next 7 days.  If patient returns call to provider office, please advise to call [facility]  at [place], [place], [place],  [address] Direct Dial: [phone] [email or web address]

## 2021-01-03 ENCOUNTER — Other Ambulatory Visit: Payer: Self-pay

## 2021-01-03 ENCOUNTER — Ambulatory Visit (INDEPENDENT_AMBULATORY_CARE_PROVIDER_SITE_OTHER): Payer: Medicare Other | Admitting: Endocrinology

## 2021-01-03 VITALS — BP 150/70 | HR 82 | Ht 70.0 in | Wt 298.6 lb

## 2021-01-03 DIAGNOSIS — E113592 Type 2 diabetes mellitus with proliferative diabetic retinopathy without macular edema, left eye: Secondary | ICD-10-CM | POA: Diagnosis not present

## 2021-01-03 LAB — POCT GLYCOSYLATED HEMOGLOBIN (HGB A1C): Hemoglobin A1C: 11.8 % — AB (ref 4.0–5.6)

## 2021-01-03 MED ORDER — OZEMPIC (0.25 OR 0.5 MG/DOSE) 2 MG/1.5ML ~~LOC~~ SOPN: 0.5000 mg | PEN_INJECTOR | SUBCUTANEOUS | 3 refills | Status: DC

## 2021-01-03 NOTE — Progress Notes (Signed)
Subjective:    Patient ID: Andrew Pruitt, male    DOB: 1956/08/06, 65 y.o.   MRN: [id number]  HPI Pt returns for f/u of diabetes mellitus:  DM type: Insulin-requiring type 2 Dx'ed: 6160 Complications: PN, PDR, and CRI.   Therapy: insulin since 2005 DKA: never Severe hypoglycemia: never Pancreatitis: never Pancreatic imaging: never SDOH: he could not afford Farxiga or Rybelsus.  Other: he declines multiple daily injections: Lantus was changed to NPH, due to pattern of cbg's.   Interval history: ins no longer pays for continuous glucose monitor sensors.  Glucose varies from 100-300.  It is in general higher as the day goes on.  Main symptoms are weight gain and insomnia.    Past Medical History:  Diagnosis Date  . Arthritis   . Depression   . Diabetes mellitus without complication ([place])   . Hyperlipidemia   . Hypertension     Past Surgical History:  Procedure Laterality Date  . TONSILLECTOMY      Social History   Socioeconomic History  . Marital status: Married    Spouse name: Not on file  . Number of children: Not on file  . Years of education: Not on file  . Highest education level: Not on file  Occupational History  . Not on file  Tobacco Use  . Smoking status: Never Smoker  . Smokeless tobacco: Never Used  Vaping Use  . Vaping Use: Never used  Substance and Sexual Activity  . Alcohol use: Not Currently  . Drug use: Never  . Sexual activity: Yes  Other Topics Concern  . Not on file  Social History Narrative  . Not on file   Social Determinants of Health   Financial Resource Strain: Not on file  Food Insecurity: Not on file  Transportation Needs: Not on file  Physical Activity: Not on file  Stress: Not on file  Social Connections: Not on file  Intimate Partner Violence: Not on file    Current Outpatient Medications on File Prior to Visit  Medication Sig Dispense Refill  . amLODipine (NORVASC) 5 MG tablet Take 1 tablet (5 mg total) by mouth daily.  Further refills will need to come from new primary care provider 90 tablet 0  . B-D UF III MINI PEN NEEDLES 31G X 5 MM MISC USE DAILY 100 each 0  . Continuous Blood Gluc Receiver (FREESTYLE LIBRE 14 DAY READER) DEVI Use to check glucose levels TID; E11.9 1 each 0  . Continuous Blood Gluc Sensor (FREESTYLE LIBRE 14 DAY SENSOR) MISC Use to check glucose TID. Change every 14 days. E 11.9 6 each 3  . diclofenac Sodium (VOLTAREN) 1 % GEL Apply 2 g topically 4 (four) times daily. 100 g 1  . gabapentin (NEURONTIN) 100 MG capsule Take 1 capsule (100 mg total) by mouth 3 (three) times daily. 90 capsule 0  . hydrochlorothiazide (HYDRODIURIL) 25 MG tablet TAKE 1 TABLET BY MOUTH EVERY DAY 90 tablet 0  . Insulin NPH, Human,, Isophane, (NOVOLIN N FLEXPEN) 100 UNIT/ML Kiwkpen Inject 150 Units into the skin every morning. 150 mL 3  . rosuvastatin (CRESTOR) 40 MG tablet Take 1 tablet (40 mg total) by mouth daily. 90 tablet 1   No current facility-administered medications on file prior to visit.    Allergies  Allergen Reactions  . Niaspan [Niacin] Other (See Comments)    Red skin, red eyes    Family History  Problem Relation Age of Onset  . Cancer Mother  Uterine   . Early death Mother   . Stroke Mother   . Diabetes Father   . Heart attack Father   . Heart disease Father   . Hyperlipidemia Father   . Diabetes Brother   . Alzheimer's disease Maternal Grandmother   . Kidney failure Maternal Grandfather   . Diabetes Paternal Grandfather   . Diabetes Brother   . Diabetes Brother   . Colon cancer Neg Hx   . Pancreatic cancer Neg Hx   . Esophageal cancer Neg Hx     BP (!) 150/70 (BP Location: Right Arm, Patient Position: Sitting, Cuff Size: Large)   Pulse 82   Ht 5\' 10"  (1.778 m)   Wt 298 lb 9.6 oz (135.4 kg)   SpO2 94%   BMI 42.84 kg/m    Review of Systems He denies hypoglycemia.     Objective:   Physical Exam VITAL SIGNS:  See vs page GENERAL: no distress Pulses: dorsalis  pedis intact bilat.   MSK: no deformity of the feet CV: 1+ bilat leg edema Skin:  no ulcer on the feet.  normal color and temp on the feet.  Neuro: sensation is intact to touch on the feet, but decreased from normal.     A1c=11.8%    Assessment & Plan:  HTN: is noted today Insulin-requiring type 2 DM: uncontrolled.  Weight gain: we discussed.  He wants to add GLP rather than increasing insulin.   Patient Instructions  Your blood pressure is high today.  Please see your primary care provider soon, to have it rechecked check your blood sugar twice a day.  vary the time of day when you check, between before the 3 meals, and at bedtime.  also check if you have symptoms of your blood sugar being too high or too low.  please keep a record of the readings and bring it to your next appointment here (or you can bring the meter itself).  You can write it on any piece of paper.  please call us sooner if your blood sugar goes below 70, or if you have a lot of readings over 200.   We will need to take this complex situation in stages.  I have sent a prescription to your pharmacy, to add "Ozempic."  Please continue the same insulin.   Please come back for a follow-up appointment in 2 months.

## 2021-01-03 NOTE — Chronic Care Management (AMB) (Signed)
  Chronic Care Management   Note  01/03/2021 Name: Andrew Pruitt MRN: [id number] DOB: May 11, 1956  Andrew Pruitt is a 65 y.o. year old male who is a primary care patient of Jon Billings, NP. I reached out to [name] by phone today in response to a referral sent by Andrew Pruitt's PCP, Jon Billings, NP     Andrew Pruitt was given information about Chronic Care Management services today including:  1. CCM service includes personalized support from designated clinical staff supervised by his physician, including individualized plan of care and coordination with other care providers 2. 24/7 contact phone numbers for assistance for urgent and routine care needs. 3. Service will only be billed when office clinical staff spend 20 minutes or more in a month to coordinate care. 4. Only one practitioner may furnish and bill the service in a calendar month. 5. The patient may stop CCM services at any time (effective at the end of the month) by phone call to the office staff. 6. The patient will be responsible for cost sharing (co-pay) of up to 20% of the service fee (after annual deductible is met).  Patient agreed to services and verbal consent obtained.   Follow up plan: Telephone appointment with care management team member scheduled for:02/07/2021  Noreene Larsson, [place], [place], [place] [address] Direct Dial: [phone] [email or web address]

## 2021-01-03 NOTE — Patient Instructions (Addendum)
Your blood pressure is high today.  Please see your primary care provider soon, to have it rechecked check your blood sugar twice a day.  vary the time of day when you check, between before the 3 meals, and at bedtime.  also check if you have symptoms of your blood sugar being too high or too low.  please keep a record of the readings and bring it to your next appointment here (or you can bring the meter itself).  You can write it on any piece of paper.  please call us sooner if your blood sugar goes below 70, or if you have a lot of readings over 200.   We will need to take this complex situation in stages.  I have sent a prescription to your pharmacy, to add "Ozempic."  Please continue the same insulin.   Please come back for a follow-up appointment in 2 months.

## 2021-01-07 ENCOUNTER — Other Ambulatory Visit: Payer: Self-pay | Admitting: Family Medicine

## 2021-01-10 ENCOUNTER — Encounter (INDEPENDENT_AMBULATORY_CARE_PROVIDER_SITE_OTHER): Payer: Medicare Other | Admitting: Ophthalmology

## 2021-01-14 ENCOUNTER — Other Ambulatory Visit: Payer: Self-pay | Admitting: Nurse Practitioner

## 2021-01-17 ENCOUNTER — Ambulatory Visit (INDEPENDENT_AMBULATORY_CARE_PROVIDER_SITE_OTHER): Payer: Medicare Other | Admitting: Ophthalmology

## 2021-01-17 ENCOUNTER — Encounter (INDEPENDENT_AMBULATORY_CARE_PROVIDER_SITE_OTHER): Payer: Self-pay | Admitting: Ophthalmology

## 2021-01-17 ENCOUNTER — Other Ambulatory Visit: Payer: Self-pay

## 2021-01-17 DIAGNOSIS — E113592 Type 2 diabetes mellitus with proliferative diabetic retinopathy without macular edema, left eye: Secondary | ICD-10-CM | POA: Diagnosis not present

## 2021-01-17 DIAGNOSIS — H2513 Age-related nuclear cataract, bilateral: Secondary | ICD-10-CM | POA: Diagnosis not present

## 2021-01-17 DIAGNOSIS — E113511 Type 2 diabetes mellitus with proliferative diabetic retinopathy with macular edema, right eye: Secondary | ICD-10-CM

## 2021-01-17 LAB — HM DIABETES EYE EXAM

## 2021-01-17 NOTE — Progress Notes (Signed)
01/17/2021     CHIEF COMPLAINT Patient presents for Retina Follow Up (4 Mo F/U OU//Pt reports [facility] OU is "doing good." Pt sts, "no floaters" OU. No new symptoms OU./A1c: 11.0, 04/22/LBS: 200-300 on avg per pt)   HISTORY OF PRESENT ILLNESS: Andrew Pruitt is a 65 y.o. male who presents to the clinic today for:   HPI    Retina Follow Up    Diagnosis: Diabetic Retinopathy   Laterality: both eyes   Onset: 4 months ago   Severity: mild   Duration: 4 months   Course: gradually improving   Comments: 4 Mo F/U OU  Pt reports VA OU is "doing good." Pt sts, "no floaters" OU. No new symptoms OU. A1c: 11.0, 04/22 LBS: 200-300 on avg per pt       Last edited by Rockie Neighbours, [place] on 01/17/2021  3:01 PM. (History)      Referring physician: Brunetta Jeans, PA-C [place],  [place] [address]  HISTORICAL INFORMATION:   Selected notes from the MEDICAL RECORD NUMBER    Lab Results  Component Value Date   HGBA1C 11.8 (A) 01/03/2021     CURRENT MEDICATIONS: No current outpatient medications on file. (Ophthalmic Drugs)   No current facility-administered medications for this visit. (Ophthalmic Drugs)   Current Outpatient Medications (Other)  Medication Sig  . amLODipine (NORVASC) 5 MG tablet Take 1 tablet (5 mg total) by mouth daily. Further refills will need to come from new primary care provider  . B-D UF III MINI PEN NEEDLES 31G X 5 MM MISC USE DAILY  . Continuous Blood Gluc Receiver (FREESTYLE LIBRE 14 DAY READER) DEVI Use to check glucose levels TID; E11.9  . Continuous Blood Gluc Sensor (FREESTYLE LIBRE 14 DAY SENSOR) MISC Use to check glucose TID. Change every 14 days. E 11.9  . diclofenac Sodium (VOLTAREN) 1 % GEL Apply 2 g topically 4 (four) times daily.  Marland Kitchen gabapentin (NEURONTIN) 100 MG capsule Take 1 capsule (100 mg total) by mouth 3 (three) times daily.  . hydrochlorothiazide (HYDRODIURIL) 25 MG tablet TAKE 1 TABLET BY MOUTH EVERY DAY  .  Insulin NPH, Human,, Isophane, (NOVOLIN N FLEXPEN) 100 UNIT/ML Kiwkpen Inject 150 Units into the skin every morning.  . rosuvastatin (CRESTOR) 40 MG tablet Take 1 tablet (40 mg total) by mouth daily.  . Semaglutide,0.25 or 0.5MG /DOS, (OZEMPIC, 0.25 OR 0.5 MG/DOSE,) 2 MG/1.5ML SOPN Inject 0.5 mg into the skin once a week.   No current facility-administered medications for this visit. (Other)      REVIEW OF SYSTEMS:    ALLERGIES Allergies  Allergen Reactions  . Niaspan [Niacin] Other (See Comments)    Red skin, red eyes    PAST MEDICAL HISTORY Past Medical History:  Diagnosis Date  . Arthritis   . Depression   . Diabetes mellitus without complication ([place])   . Hyperlipidemia   . Hypertension    Past Surgical History:  Procedure Laterality Date  . TONSILLECTOMY      FAMILY HISTORY Family History  Problem Relation Age of Onset  . Cancer Mother        Uterine   . Early death Mother   . Stroke Mother   . Diabetes Father   . Heart attack Father   . Heart disease Father   . Hyperlipidemia Father   . Diabetes Brother   . Alzheimer's disease Maternal Grandmother   . Kidney failure Maternal Grandfather   . Diabetes Paternal Grandfather   .  Diabetes Brother   . Diabetes Brother   . Colon cancer Neg Hx   . Pancreatic cancer Neg Hx   . Esophageal cancer Neg Hx     SOCIAL HISTORY Social History   Tobacco Use  . Smoking status: Never Smoker  . Smokeless tobacco: Never Used  Vaping Use  . Vaping Use: Never used  Substance Use Topics  . Alcohol use: Not Currently  . Drug use: Never         OPHTHALMIC EXAM: Base Eye Exam    Visual Acuity (ETDRS)      Right Left   Dist [place] 20/25 +1 20/50 +2   Dist ph [place]  20/25 -2       Tonometry (Tonopen, 3:06 PM)      Right Left   Pressure 13 19       Pupils      Pupils Dark Light Shape React APD   Right PERRL 5 4 Round Sluggish None   Left PERRL 5 4 Round Sluggish None       Visual Fields (Counting fingers)       Left Right    Full Full       Extraocular Movement      Right Left    Full Full       Neuro/Psych    Oriented x3: Yes   Mood/Affect: Normal       Dilation    Both eyes: 1.0% Mydriacyl, 2.5% Phenylephrine @ 3:06 PM        Slit Lamp and Fundus Exam    External Exam      Right Left   External Normal Normal       Slit Lamp Exam      Right Left   Lids/Lashes Normal Normal   Conjunctiva/Sclera White and quiet White and quiet   Cornea Clear Clear   Anterior Chamber Deep and quiet Deep and quiet   Iris Round and reactive Round and reactive   Lens 2+ Nuclear sclerosis 1+ Nuclear sclerosis   Anterior Vitreous no cells no cells       Fundus Exam      Right Left   Posterior Vitreous Posterior vitreous detachment Normal   Disc Normal Normal   C/D Ratio 0.4 0.45   Macula Microaneurysms,  , no exudates, no clinically significant macular edema, no macular thickening Microaneurysms, no macular thickening   Vessels NPDR-Severe NPDR-Severe   Periphery horseshoe retinal tear 7:00 with good retinopexy surrounding Normal, good PRP anterior,           IMAGING AND PROCEDURES  Imaging and Procedures for 01/17/21  OCT, Retina - OU - Both Eyes       Right Eye Quality was good. Scan locations included subfoveal. Central Foveal Thickness: 282. Progression has been stable. Findings include abnormal foveal contour.   Left Eye Quality was good. Scan locations included subfoveal. Central Foveal Thickness: 292. Progression has been stable. Findings include abnormal foveal contour.   Notes No active CSME at this time yet with poor blood sugar control and history of CSME and underlying severe NPDR will need to follow-up in 4 to 5 months                ASSESSMENT/PLAN:  Diabetic macular edema of right eye with proliferative retinopathy associated with type 2 diabetes mellitus (HCC) CSME OD has resolved, no longer active, status post peripheral PRP  Type 2 diabetes mellitus  with proliferative diabetic retinopathy of left eye without macular edema (  [place]) OS stable, no signs of recurrent CSME will continue to observe  Nuclear sclerotic cataract of both eyes Minor OU no impact on acuity      ICD-10-CM   1. Diabetic macular edema of right eye with proliferative retinopathy associated with type 2 diabetes mellitus (HCC)  E11.3511 OCT, Retina - OU - Both Eyes  2. Type 2 diabetes mellitus with left eye affected by proliferative retinopathy without macular edema, without long-term current use of insulin (HCC)  E11.3592 OCT, Retina - OU - Both Eyes  3. Nuclear sclerotic cataract of both eyes  H25.13     1.  We will need to closely monitor peripheral retinopathy from severe NPDR to early PDR due to patient's persistent and ongoing poor blood sugar control.  Follow-up in 4 to 5 months next  2.  3.  Ophthalmic Meds Ordered this visit:  No orders of the defined types were placed in this encounter.      Return in about 18 weeks (around 05/23/2021) for DILATE OU, COLOR FP, OCT.  There are no Patient Instructions on file for this visit.   Explained the diagnoses, plan, and follow up with the patient and they expressed understanding.  Patient expressed understanding of the importance of proper follow up care.   Clent Demark Jaquesha Boroff M.D. Diseases & Surgery of the Retina and Vitreous Retina & Diabetic [place] 01/17/21     Abbreviations: M myopia (nearsighted); A astigmatism; H hyperopia (farsighted); P presbyopia; Mrx spectacle prescription;  CTL contact lenses; OD right eye; OS left eye; OU both eyes  XT exotropia; ET esotropia; PEK punctate epithelial keratitis; PEE punctate epithelial erosions; DES dry eye syndrome; MGD meibomian gland dysfunction; ATs artificial tears; PFAT's preservative free artificial tears; [place] nuclear sclerotic cataract; PSC posterior subcapsular cataract; ERM epi-retinal membrane; PVD posterior vitreous detachment; RD retinal detachment; DM  diabetes mellitus; DR diabetic retinopathy; NPDR non-proliferative diabetic retinopathy; PDR proliferative diabetic retinopathy; CSME clinically significant macular edema; DME diabetic macular edema; dbh dot blot hemorrhages; CWS cotton wool spot; POAG primary open angle glaucoma; C/D cup-to-disc ratio; HVF humphrey visual field; GVF goldmann visual field; OCT optical coherence tomography; IOP intraocular pressure; BRVO Branch retinal vein occlusion; CRVO central retinal vein occlusion; CRAO central retinal artery occlusion; BRAO branch retinal artery occlusion; RT retinal tear; SB scleral buckle; PPV pars plana vitrectomy; VH Vitreous hemorrhage; PRP panretinal laser photocoagulation; IVK intravitreal kenalog; VMT vitreomacular traction; MH Macular hole;  NVD neovascularization of the disc; NVE neovascularization elsewhere; AREDS age related eye disease study; ARMD age related macular degeneration; POAG primary open angle glaucoma; EBMD epithelial/anterior basement membrane dystrophy; ACIOL anterior chamber intraocular lens; IOL intraocular lens; PCIOL posterior chamber intraocular lens; Phaco/IOL phacoemulsification with intraocular lens placement; [place] photorefractive keratectomy; LASIK laser assisted in situ keratomileusis; HTN hypertension; DM diabetes mellitus; COPD chronic obstructive pulmonary disease

## 2021-01-17 NOTE — Assessment & Plan Note (Signed)
Minor OU no impact on acuity

## 2021-01-17 NOTE — Assessment & Plan Note (Signed)
CSME OD has resolved, no longer active, status post peripheral PRP

## 2021-01-17 NOTE — Assessment & Plan Note (Signed)
OS stable, no signs of recurrent CSME will continue to observe

## 2021-01-20 ENCOUNTER — Ambulatory Visit: Payer: Medicare Other | Admitting: Nurse Practitioner

## 2021-01-24 ENCOUNTER — Other Ambulatory Visit: Payer: Self-pay

## 2021-01-24 ENCOUNTER — Encounter: Payer: Self-pay | Admitting: Nurse Practitioner

## 2021-01-24 ENCOUNTER — Ambulatory Visit (INDEPENDENT_AMBULATORY_CARE_PROVIDER_SITE_OTHER): Payer: Medicare Other | Admitting: Nurse Practitioner

## 2021-01-24 VITALS — BP 130/82 | HR 83 | Temp 97.9°F | Wt 292.0 lb

## 2021-01-24 DIAGNOSIS — I152 Hypertension secondary to endocrine disorders: Secondary | ICD-10-CM

## 2021-01-24 DIAGNOSIS — F5101 Primary insomnia: Secondary | ICD-10-CM

## 2021-01-24 DIAGNOSIS — E1169 Type 2 diabetes mellitus with other specified complication: Secondary | ICD-10-CM

## 2021-01-24 DIAGNOSIS — IMO0002 Reserved for concepts with insufficient information to code with codable children: Secondary | ICD-10-CM

## 2021-01-24 DIAGNOSIS — E1159 Type 2 diabetes mellitus with other circulatory complications: Secondary | ICD-10-CM

## 2021-01-24 DIAGNOSIS — E1122 Type 2 diabetes mellitus with diabetic chronic kidney disease: Secondary | ICD-10-CM

## 2021-01-24 DIAGNOSIS — E1165 Type 2 diabetes mellitus with hyperglycemia: Secondary | ICD-10-CM

## 2021-01-24 DIAGNOSIS — E785 Hyperlipidemia, unspecified: Secondary | ICD-10-CM

## 2021-01-24 LAB — URINALYSIS, ROUTINE W REFLEX MICROSCOPIC
Bilirubin, UA: NEGATIVE
Ketones, UA: NEGATIVE
Leukocytes,UA: NEGATIVE
Nitrite, UA: NEGATIVE
Protein,UA: NEGATIVE
RBC, UA: NEGATIVE
Specific Gravity, UA: 1.01 (ref 1.005–1.030)
Urobilinogen, Ur: 0.2 mg/dL (ref 0.2–1.0)
pH, UA: 5 (ref 5.0–7.5)

## 2021-01-24 MED ORDER — ATORVASTATIN CALCIUM 40 MG PO TABS: 40.0000 mg | ORAL_TABLET | Freq: Every day | ORAL | 1 refills | Status: DC

## 2021-01-24 NOTE — Assessment & Plan Note (Signed)
Chronic.  Patient did not start Crestor due to cost. Atorvastatin sent for patient.  Referral placed for CCM to see if financial assistance is available to patient.   Will draw labs at next visit.

## 2021-01-24 NOTE — Assessment & Plan Note (Signed)
Elevated at initial check and better controlled on recheck.  Patient checks at home and runs 120-130s/80s.  Will consider Ace/ARB at future visit due to patient having diabetes.  Labs ordered today.  Follow up in 3 months.

## 2021-01-24 NOTE — Progress Notes (Signed)
BP 130/82   Pulse 83   Temp 97.9 F (36.6 C)   Wt 292 lb (132.5 kg)   SpO2 95%   BMI 41.90 kg/m    Subjective:    Patient ID: Andrew Pruitt, male    DOB: Feb 05, 1956, 65 y.o.   MRN: [id number]  HPI: Andrew Pruitt is a 65 y.o. male  Chief Complaint  Patient presents with  . Diabetes   HYPERTENSION / HYPERLIPIDEMIA Satisfied with current treatment? yes Duration of hypertension: years BP monitoring frequency: weekly BP range: 120-130/80s BP medication side effects: no Past BP meds: amlodipine/NCTZ Duration of hyperlipidemia: years Cholesterol medication side effects: no Cholesterol supplements: none Past cholesterol medications: none Medication compliance: good compliance Aspirin: no Recent stressors: no Recurrent headaches: no Visual changes: no Palpitations: no Dyspnea: no Chest pain: no Lower extremity edema: no Dizzy/lightheaded: no  DIABETES Hypoglycemic episodes:no Polydipsia/polyuria: no Visual disturbance: no Chest pain: no Paresthesias: no Glucose Monitoring: no  Accucheck frequency: TID  Fasting glucose:  Post prandial:  Evening:  Before meals: Taking Insulin?: yes  Long acting insulin: 150u daily  Short acting insulin: Blood Pressure Monitoring: weekly Retinal Examination:Up to date Foot Exam: Up to date Diabetic Education: Not Completed Pneumovax: unknown Influenza: unknown Aspirin: no  INSOMNIA Patient only getting 2-3 hours of sleep per night. Complains of fatigue and lack of energy. Has only tried melatonin to help him sleep which was not helpful.     Relevant past medical, surgical, family and social history reviewed and updated as indicated. Interim medical history since our last visit reviewed. Allergies and medications reviewed and updated.  Review of Systems  Constitutional: Positive for fatigue.  Eyes: Negative for visual disturbance.  Respiratory: Negative for chest tightness and shortness of breath.   Cardiovascular:  Negative for chest pain, palpitations and leg swelling.  Endocrine: Negative for polydipsia and polyuria.  Neurological: Negative for dizziness, light-headedness, numbness and headaches.    Per HPI unless specifically indicated above     Objective:    BP 130/82   Pulse 83   Temp 97.9 F (36.6 C)   Wt 292 lb (132.5 kg)   SpO2 95%   BMI 41.90 kg/m   Wt Readings from Last 3 Encounters:  01/24/21 292 lb (132.5 kg)  01/03/21 298 lb 9.6 oz (135.4 kg)  12/21/20 288 lb 6 oz (130.8 kg)    Physical Exam Vitals and nursing note reviewed.  Constitutional:      General: He is not in acute distress.    Appearance: Normal appearance. He is obese. He is not ill-appearing, toxic-appearing or diaphoretic.  HENT:     Head: Normocephalic.     Right Ear: External ear normal.     Left Ear: External ear normal.     Nose: Nose normal. No congestion or rhinorrhea.     Mouth/Throat:     Mouth: Mucous membranes are moist.  Eyes:     General:        Right eye: No discharge.        Left eye: No discharge.     Extraocular Movements: Extraocular movements intact.     Conjunctiva/sclera: Conjunctivae normal.     Pupils: Pupils are equal, round, and reactive to light.  Cardiovascular:     Rate and Rhythm: Normal rate and regular rhythm.     Heart sounds: No murmur heard.   Pulmonary:     Effort: Pulmonary effort is normal. No respiratory distress.     Breath sounds: Normal breath sounds.  No wheezing, rhonchi or rales.  Abdominal:     General: Abdomen is flat. Bowel sounds are normal.  Musculoskeletal:     Cervical back: Normal range of motion and neck supple.  Skin:    General: Skin is warm and dry.     Capillary Refill: Capillary refill takes less than 2 seconds.  Neurological:     General: No focal deficit present.     Mental Status: He is alert and oriented to person, place, and time.  Psychiatric:        Mood and Affect: Mood normal.        Behavior: Behavior normal.        Thought  Content: Thought content normal.        Judgment: Judgment normal.     Results for orders placed or performed in visit on 01/24/21  HM DIABETES EYE EXAM  Result Value Ref Range   HM Diabetic Eye Exam Retinopathy (A) No Retinopathy      Assessment & Plan:   Problem List Items Addressed This Visit      Cardiovascular and Mediastinum   Hypertension associated with diabetes ([place]) - Primary    Elevated at initial check and better controlled on recheck.  Patient checks at home and runs 120-130s/80s.  Will consider Ace/ARB at future visit due to patient having diabetes.  Labs ordered today.  Follow up in 3 months.      Relevant Medications   atorvastatin (LIPITOR) 40 MG tablet   Other Relevant Orders   Lipid panel   Basic metabolic panel   Urinalysis, Routine w reflex microscopic   HgB A1c   AMB Referral to [facility]     Endocrine   Uncontrolled type II diabetes mellitus with chronic kidney disease ([place])    Uncontrolled. Managed by Endocrinology.  Ozempic is not covered by patient's insurance. Referral placed for CCM to see if financial assistance is available to patient. Labs ordered today.  Follow up in 3 months.       Relevant Medications   atorvastatin (LIPITOR) 40 MG tablet   Hyperlipidemia associated with type 2 diabetes mellitus (HCC)    Chronic.  Patient did not start Crestor due to cost. Atorvastatin sent for patient.  Referral placed for CCM to see if financial assistance is available to patient.   Will draw labs at next visit.        Relevant Medications   atorvastatin (LIPITOR) 40 MG tablet   Other Relevant Orders   Lipid panel   Basic metabolic panel   Urinalysis, Routine w reflex microscopic   HgB A1c   AMB Referral to [facility]    Other Visit Diagnoses    Primary insomnia       Recommend trying benadryl to help with sleep. If not able to sleep with medication assistance can try trazadone and possible sleep study.         Follow up plan: Return in about 3 months (around 04/26/2021) for HTN, HLD, DM2 FU.

## 2021-01-24 NOTE — Assessment & Plan Note (Signed)
Uncontrolled. Managed by Endocrinology.  Ozempic is not covered by patient's insurance. Referral placed for CCM to see if financial assistance is available to patient. Labs ordered today.  Follow up in 3 months.

## 2021-01-25 LAB — LIPID PANEL
Chol/HDL Ratio: 9.5 ratio — ABNORMAL HIGH (ref 0.0–5.0)
Cholesterol, Total: 200 mg/dL — ABNORMAL HIGH (ref 100–199)
HDL: 21 mg/dL — ABNORMAL LOW (ref >39)
Triglycerides: 1075 mg/dL (ref 0–149)

## 2021-01-25 LAB — BASIC METABOLIC PANEL
BUN/Creatinine Ratio: 15 (ref 10–24)
BUN: 25 mg/dL (ref 8–27)
CO2: 21 mmol/L (ref 20–29)
Calcium: 9.3 mg/dL (ref 8.6–10.2)
Chloride: 94 mmol/L — ABNORMAL LOW (ref 96–106)
Creatinine, Ser: 1.69 mg/dL — ABNORMAL HIGH (ref 0.76–1.27)
Sodium: 135 mmol/L (ref 134–144)
eGFR: 44 mL/min/{1.73_m2} — ABNORMAL LOW (ref >59)

## 2021-01-25 LAB — HEMOGLOBIN A1C
Est. average glucose Bld gHb Est-mCnc: 292 mg/dL
Hgb A1c MFr Bld: 11.8 % — ABNORMAL HIGH (ref 4.8–5.6)

## 2021-01-25 MED ORDER — ATORVASTATIN CALCIUM 80 MG PO TABS: 80.0000 mg | ORAL_TABLET | Freq: Every day | ORAL | 3 refills | Status: AC

## 2021-01-25 MED ORDER — FENOFIBRATE 145 MG PO TABS: 145.0000 mg | ORAL_TABLET | Freq: Every day | ORAL | 1 refills | Status: DC

## 2021-01-25 NOTE — Progress Notes (Signed)
Hi Mr. Kinney.  Your lab work shows that your triglycerides are extremely elevated.  I recommend beginning a medication to help with that called Fenofibrate.  This speciallically targets your triglycerides.  You should also cut down on the processed foods and sugar you are consuming to help with this.  I also recommend you increase your atorvastatin to 80mg .  If you are okay with these medication changes I can send them to the pharmacy for you.    Your kidney function worsened since 11 months ago. This could be due to your A1c being above goal. However, I would like to recheck it next week to make sure it comes back down. Please make a lab appointment.   Your A1c remains at 11.8. I have talked to Almyra Free our Pharmacist to make her aware of your situation and she is going to see what she can do to help [place] with other medication options.

## 2021-01-25 NOTE — Progress Notes (Signed)
Please let patient know I sent his medications to the pharmacy.

## 2021-01-25 NOTE — Addendum Note (Signed)
Addended by: Jon Billings on: 01/25/2021 12:42 PM   Modules accepted: Orders

## 2021-01-25 NOTE — Addendum Note (Signed)
Addended by: Jon Billings on: 01/25/2021 01:41 PM   Modules accepted: Orders

## 2021-01-26 ENCOUNTER — Telehealth: Payer: Self-pay

## 2021-01-26 NOTE — Chronic Care Management (AMB) (Signed)
  Chronic Care Management   Note  01/26/2021 Name: Andrew Pruitt MRN: [id number] DOB: 01/21/1956  Andrew Pruitt is a 65 y.o. year old male who is a primary care patient of Jon Billings, NP. Andrew Pruitt is currently enrolled in care management services. An additional referral for Pharm D was placed.   Follow up plan: Telephone appointment with care management team member scheduled for:02/15/2021  Noreene Larsson, [address], [place], [place] [id number] Direct Dial: [phone] [email or web address] Website: [email or web address]

## 2021-02-01 ENCOUNTER — Telehealth: Payer: Medicare Other

## 2021-02-03 ENCOUNTER — Other Ambulatory Visit: Payer: Medicare Other

## 2021-02-03 ENCOUNTER — Other Ambulatory Visit: Payer: Self-pay

## 2021-02-03 DIAGNOSIS — E1159 Type 2 diabetes mellitus with other circulatory complications: Secondary | ICD-10-CM

## 2021-02-03 DIAGNOSIS — I152 Hypertension secondary to endocrine disorders: Secondary | ICD-10-CM

## 2021-02-04 LAB — COMPREHENSIVE METABOLIC PANEL
ALT: 34 IU/L (ref 0–44)
AST: 22 IU/L (ref 0–40)
Albumin/Globulin Ratio: 1.9 (ref 1.2–2.2)
Albumin: 4.4 g/dL (ref 3.8–4.8)
Alkaline Phosphatase: 57 IU/L (ref 44–121)
BUN/Creatinine Ratio: 12 (ref 10–24)
BUN: 19 mg/dL (ref 8–27)
Bilirubin Total: 0.6 mg/dL (ref 0.0–1.2)
CO2: 26 mmol/L (ref 20–29)
Calcium: 9.6 mg/dL (ref 8.6–10.2)
Chloride: 99 mmol/L (ref 96–106)
Creatinine, Ser: 1.55 mg/dL — ABNORMAL HIGH (ref 0.76–1.27)
Globulin, Total: 2.3 g/dL (ref 1.5–4.5)
Glucose: 221 mg/dL — ABNORMAL HIGH (ref 65–99)
Potassium: 3.9 mmol/L (ref 3.5–5.2)
Sodium: 140 mmol/L (ref 134–144)
Total Protein: 6.7 g/dL (ref 6.0–8.5)
eGFR: 49 mL/min/{1.73_m2} — ABNORMAL LOW (ref >59)

## 2021-02-07 ENCOUNTER — Ambulatory Visit (INDEPENDENT_AMBULATORY_CARE_PROVIDER_SITE_OTHER): Payer: Medicare Other | Admitting: General Practice

## 2021-02-07 ENCOUNTER — Telehealth: Payer: Medicare Other | Admitting: General Practice

## 2021-02-07 DIAGNOSIS — E785 Hyperlipidemia, unspecified: Secondary | ICD-10-CM

## 2021-02-07 DIAGNOSIS — Z794 Long term (current) use of insulin: Secondary | ICD-10-CM

## 2021-02-07 DIAGNOSIS — E1140 Type 2 diabetes mellitus with diabetic neuropathy, unspecified: Secondary | ICD-10-CM

## 2021-02-07 DIAGNOSIS — E1159 Type 2 diabetes mellitus with other circulatory complications: Secondary | ICD-10-CM | POA: Diagnosis not present

## 2021-02-07 DIAGNOSIS — I152 Hypertension secondary to endocrine disorders: Secondary | ICD-10-CM

## 2021-02-07 DIAGNOSIS — E1169 Type 2 diabetes mellitus with other specified complication: Secondary | ICD-10-CM

## 2021-02-07 NOTE — Progress Notes (Signed)
Hi Mr. Kuiken.  Your kidney function improved some.  We need to work hard to get your A1c under control in order to prevent it from worsening and we may see it improve some more.  Please let me know if you have any questions.

## 2021-02-07 NOTE — Patient Instructions (Signed)
Visit Information   PATIENT GOALS:  Goals Addressed            This Visit's Progress   . RNCM: Monitor and Manage My Blood Sugar-Diabetes Type 2       Timeframe:  Long-Range Goal Priority:  High Start Date: 02-07-2021                            Expected End Date:    02-07-2022                   Follow Up Date 03-28-2021   - check blood sugar at prescribed times - check blood sugar before and after exercise - check blood sugar if I feel it is too high or too low - enter blood sugar readings and medication or insulin into daily log - take the blood sugar log to all doctor visits - take the blood sugar meter to all doctor visits    Why is this important?    Checking your blood sugar at home helps to keep it from getting very high or very low.   Writing the results in a diary or log helps the doctor know how to care for you.   Your blood sugar log should have the time, date and the results.   Also, write down the amount of insulin or other medicine that you take.   Other information, like what you ate, exercise done and how you were feeling, will also be helpful.     Notes: Patient with hemoglobin A1C of 11.8 on 01-24-2021- blood sugar reading range 88 to 350.          CLINICAL CARE PLAN: Patient Care Plan: RNCM: Diabetes Type 2 (Adult)    Problem Identified: RNCM: Glycemic Management (Diabetes, Type 2)   Priority: High    Long-Range Goal: RNCM: Glycemic Management Optimized   Priority: High  Note:   Objective:  Lab Results  Component Value Date   HGBA1C 11.8 (H) 01/24/2021 .   Lab Results  Component Value Date   CREATININE 1.55 (H) 02/03/2021   CREATININE 1.69 (H) 01/24/2021   CREATININE 1.26 02/11/2020 .   Lab Results  Component Value Date   EGFR 49 (L) 02/03/2021 .   Current Barriers:  Marland Kitchen Knowledge Deficits related to basic Diabetes pathophysiology and self care/management . Knowledge Deficits related to medications used for management of  diabetes . 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 Unable to self administer medications as prescribed- unable to afford Ozempic . Does not adhere to prescribed medication regimen Case Manager Clinical Goal(s):  . patient will demonstrate improved adherence to prescribed treatment plan for diabetes self care/management as evidenced by: daily monitoring and recording of CBG  adherence to ADA/ carb modified diet exercise 5 days/week adherence to prescribed medication regimen contacting provider for new or worsened symptoms or questions work with pharm D to see if there are programs to help with affording Ozempic- appointment 02-15-2021 Interventions:  . Collaboration with Jon Billings, NP regarding development and update of comprehensive plan of care as evidenced by provider attestation and co-signature . Inter-disciplinary care team collaboration (see longitudinal plan of care) . Provided education to patient about basic DM disease process. Will send information by the EMMI system and My Chart on dietary restrictions for DM and heart healthy diet. As well as  exercise and pacing activity  . Reviewed medications with patient and discussed importance of medication adherence. The patient can not afford the Ozempic. States the endocrinologist has upped his insulin up to 150 units a day and he has no energy and can not do exercise. He has gained 40 pounds. His usual weight is 260 and he weights 300 pounds and is 6'0". Appointment with pharm D to help with cost constraints of Ozempic on 02-15-2021.  Marland Kitchen Discussed plans with patient for ongoing care management follow up and provided patient with direct contact information for care management team . Provided patient with written educational materials related to hypo and hyperglycemia and importance of correct treatment. Review of fasting  blood sugars or <130 and post prandial of <180. The patient states his range in am is 88 and other times 250-350 . Reviewed scheduled/upcoming provider appointments including: 05-01-2021 at 0900 am . Advised patient, providing education and rationale, to check cbg tid and record, calling pcp/endocrinologist  for findings outside established parameters.   . Referral made to pharmacy team for assistance with cost of Ozempic and the patients cost constraints.  . Review of patient status, including review of consultants reports, relevant laboratory and other test results, and medications completed. Self-Care Activities - UNABLE to independently manage DM Self administers oral medications as prescribed Self administers insulin as prescribed Attends all scheduled provider appointments Checks blood sugars as prescribed and utilize hyper and hypoglycemia protocol as needed Adheres to prescribed ADA/carb modified Patient Goals: - check blood sugar at prescribed times - check blood sugar before and after exercise - check blood sugar if I feel it is too high or too low - enter blood sugar readings and medication or insulin into daily log - take the blood sugar log to all doctor visits - change to whole grain breads, cereal, pasta - set goal weight - drink 6 to 8 glasses of water each day - eat fish at least once per week - fill half of plate with vegetables - limit fast food meals to no more than 1 per week - manage portion size - prepare main meal at home 3 to 5 days each week - read food labels for fat, fiber, carbohydrates and portion size - set a realistic goal - schedule appointment with eye doctor - check feet daily for cuts, sores or redness - keep feet up while sitting - trim toenails straight across - wash and dry feet carefully every day - wear comfortable, cotton socks - wear comfortable, well-fitting shoes - barriers to adherence to treatment plan identified - blood glucose  readings reviewed - individualized medical nutrition therapy provided - mutual A1C goal set or reviewed - resources required to improve adherence to care identified - self-awareness of signs/symptoms of hypo or hyperglycemia encouraged - use of blood glucose monitoring log promoted Follow Up Plan: Telephone follow up appointment with care management team member scheduled for: 03-28-2021 at 230 pm   imeframe:  Long-Range Goal Priority:  High Start Date: 02-07-2021                            Expected End Date:    02-07-2022                   Follow Up Date 03-28-2021   - check blood sugar at prescribed times - check blood sugar before and after exercise - check blood sugar if I feel it is too high  or too low - enter blood sugar readings and medication or insulin into daily log - take the blood sugar log to all doctor visits - take the blood sugar meter to all doctor visits    Why is this important?    Checking your blood sugar at home helps to keep it from getting very high or very low.   Writing the results in a diary or log helps the doctor know how to care for you.   Your blood sugar log should have the time, date and the results.   Also, write down the amount of insulin or other medicine that you take.   Other information, like what you ate, exercise done and how you were feeling, will also be helpful.     Notes: Patient with hemoglobin A1C of 11.8 on 01-24-2021- blood sugar reading range 88 to 350.    Task: RNCM: Alleviate Barriers to Glycemic Management   Note:   Care Management Activities:    - barriers to adherence to treatment plan identified - blood glucose readings reviewed - individualized medical nutrition therapy provided - mutual A1C goal set or reviewed - resources required to improve adherence to care identified - self-awareness of signs/symptoms of hypo or hyperglycemia encouraged - use of blood glucose monitoring log promoted       Patient Care Plan:  RNCM: Hypertension (Adult)    Problem Identified: RNCM: Hypertension (Hypertension)   Priority: Medium    Long-Range Goal: RNCM: Hypertension Monitored   Priority: Medium  Note:   Objective:  . Last practice recorded BP readings:  BP Readings from Last 3 Encounters:  01/24/21 130/82  01/03/21 (!) 150/70  12/21/20 (!) 150/81 .   Marland Kitchen Most recent eGFR/CrCl:  Lab Results  Component Value Date   EGFR 49 (L) 02/03/2021 .    No components found for: CRCL Current Barriers:  Marland Kitchen Knowledge Deficits related to basic understanding of hypertension pathophysiology and self care management . Knowledge Deficits related to understanding of medications prescribed for management of hypertension . Film/video editor.  . Unable to independently manage HTN . Does not maintain contact with provider office . Does not contact provider office for questions/concerns Case Manager Clinical Goal(s):  . patient will verbalize understanding of plan for hypertension management . patient will attend all scheduled medical appointments: 05-01-2021 at 0900 am . patient will demonstrate improved adherence to prescribed treatment plan for hypertension as evidenced by taking all medications as prescribed, monitoring and recording blood pressure as directed, adhering to low sodium/DASH diet . patient will demonstrate improved health management independence as evidenced by checking blood pressure as directed and notifying PCP if SBP>150 or DBP > 90, taking all medications as prescribe, and adhering to a low sodium diet as discussed. . patient will verbalize basic understanding of hypertension disease process and self health management plan as evidenced by compliance with Heart healthy/ADA diet, compliance with medications regiment, and working with the CCM team to effectively manage health and well being.  Interventions:  . Collaboration with Jon Billings, NP regarding development and update of comprehensive plan of care  as evidenced by provider attestation and co-signature . Inter-disciplinary care team collaboration (see longitudinal plan of care) . Evaluation of current treatment plan related to hypertension self management and patient's adherence to plan as established by provider. . Provided education to patient re: stroke prevention, s/s of heart attack and stroke, DASH diet, complications of uncontrolled blood pressure . Reviewed medications with patient and discussed importance of compliance .  Discussed plans with patient for ongoing care management follow up and provided patient with direct contact information for care management team . Advised patient, providing education and rationale, to monitor blood pressure daily and record, calling PCP for findings outside established parameters.  . Reviewed scheduled/upcoming provider appointments including: 05-01-2021 at 0900 am Self-Care Activities: - Self administers medications as prescribed Attends all scheduled provider appointments Calls provider office for new concerns, questions, or BP outside discussed parameters Checks BP and records as discussed Follows a low sodium diet/DASH diet Patient Goals: - check blood pressure weekly - choose a place to take my blood pressure (home, clinic or office, retail store) - write blood pressure results in a log or diary - agree on reward when goals are met - agree to work together to make changes - ask questions to understand - learn about high blood pressure - blood pressure trends reviewed - depression screen reviewed - home or ambulatory blood pressure monitoring encouraged Follow Up Plan: Telephone follow up appointment with care management team member scheduled for: 03-28-2021 at 230 pm    Task: RNCM: Identify and Monitor Blood Pressure Elevation   Note:   Care Management Activities:    - blood pressure trends reviewed - depression screen reviewed - home or ambulatory blood pressure monitoring  encouraged     Patient Care Plan: RNCM: HLD Management    Problem Identified: RNCM; HLD management   Priority: High    Long-Range Goal: Self-Management Plan Developed   Priority: High  Note:   Current Barriers:  . Poorly controlled hyperlipidemia, complicated by DM, low energy level, insomnia . Current antihyperlipidemic regimen: Atorvastatin 30m and Fenofibrate 145 mg daily . Most recent lipid panel:     Component Value Date/Time   CHOL 200 (H) 01/24/2021 1030   TRIG 1,075 (HH) 01/24/2021 1030   HDL 21 (L) 01/24/2021 1030   CHOLHDL 9.5 (H) 01/24/2021 1030   CHOLHDL 12 02/11/2020 1037   LDLCALC Comment (A) 01/24/2021 1030   LDLDIRECT 81.0 02/11/2020 1037 .   .Marland KitchenASCVD risk enhancing conditions: age >>76 DM, HTN . Unable to independently manage HLD as evidence of abnormal labs . Picky eater, does not like vegetables  RN Care Manager Clinical Goal(s):  . patient will work with RConsulting civil engineer providers, and care team towards execution of optimized self-health management plan . patient will verbalize understanding of plan for effective management of HLD  . [facility] and pcp  to address needs related to effective management of HLD  . patient will attend all scheduled medical appointments: 05-01-2021 at 0900 am . patient will demonstrate improved adherence to prescribed treatment plan for HLD as evidenced bypostivie response to medications regimen and lab work trending toward normal limits.  Interventions: . Collaboration with HJon Billings NP regarding development and update of comprehensive plan of care as evidenced by provider attestation and co-signature . Inter-disciplinary care team collaboration (see longitudinal plan of care) . Medication review performed; medication list updated in electronic medical record. The patient has had changes in his medications regimen for HLD. He is taking the atorvastatin 80 mg and fenofibrate 145 mg daily without  difficulty. No cost constraints with HLD medications regimen.  .[facility] team collaboration (see longitudinal plan of care) . Referred to pharmacy team for assistance with HLD medication management- has an appointment on 02-15-2021 with the pharmacist for medications reconciliation and concerns  . Evaluation of current treatment plan related to HLD and patient's adherence  to plan as established by provider. . Advised patient to work on dietary changes, call the office for changes or questions.  . Provided education to patient re: heart healthy/ADA diet, review of food choices due to the patients picky eating habits. Review of exercise routine. The patient states he can not exercise longer than 10 minutes due to his energy level and not being able to do any more activity after that.  . Reviewed medications with patient and discussed compliance. States compliance with medications for HLD  . Reviewed scheduled/upcoming provider appointments including: 05-01-2021 AT 0900 am . Discussed plans with patient for ongoing care management follow up and provided patient with direct contact information for care management team Patient Goals/Self-Care Activities: - call for medicine refill 2 or 3 days before it runs out - call if I am sick and can't take my medicine - keep a list of all the medicines I take; vitamins and herbals too - learn to read medicine labels - use a pillbox to sort medicine - use an alarm clock or phone to remind me to take my medicine - change to whole grain breads, cereal, pasta - drink 6 to 8 glasses of water each day - eat 3 to 5 servings of fruits and vegetables each day - eat 5 or 6 small meals each day - eat fish at least once per week - fill half the plate with nonstarchy vegetables - limit fast food meals to no more than 1 per week - manage portion size - prepare main meal at home 3 to 5 days each week - read food labels for fat, fiber, carbohydrates and  portion size - be open to making changes - I can manage, know and watch for signs of a heart attack - if I have chest pain, call for help - learn about small changes that will make a big difference - learn my personal risk factors - barriers to meeting goals identified - change-talk evoked - choices provided - collaboration with team encouraged - decision-making supported - health risks reviewed - problem-solving facilitated - questions answered - readiness for change evaluated - reassurance provided - self-reflection promoted - self-reliance encouraged - verbalization of feelings encouraged  Follow Up Plan: Telephone follow up appointment with care management team member scheduled for: 03-28-2021 at 230 pm     Task: RNCM: HLD Management   Note:   Care Management Activities:    - barriers to meeting goals identified - change-talk evoked - choices provided - collaboration with team encouraged - decision-making supported - health risks reviewed - problem-solving facilitated - questions answered - readiness for change evaluated - reassurance provided - self-reflection promoted - self-reliance encouraged - verbalization of feelings encouraged         Consent to CCM Services: Andrew Pruitt was given information about Chronic Care Management services today including:  1. CCM service includes personalized support from designated clinical staff supervised by his physician, including individualized plan of care and coordination with other care providers 2. 24/7 contact phone numbers for assistance for urgent and routine care needs. 3. Service will only be billed when office clinical staff spend 20 minutes or more in a month to coordinate care. 4. Only one practitioner may furnish and bill the service in a calendar month. 5. The patient may stop CCM services at any time (effective at the end of the month) by phone call to the office staff. 6. The patient will be responsible for  cost sharing (co-pay) of up  to 20% of the service fee (after annual deductible is met).  Patient agreed to services and verbal consent obtained.   Patient verbalizes understanding of instructions provided today and agrees to view in [place].   Telephone follow up appointment with care management team member scheduled for: 03-28-2021 at 53 pm  Noreene Larsson RN, MSN, [facility] Mobile: [phone]   https://www.diabeteseducator.org/docs/default-source/living-with-diabetes/conquering-the-grocery-store-v1.pdf?sfvrsn=4">  Carbohydrate Counting for Diabetes Mellitus, Adult Carbohydrate counting is a method of keeping track of how many carbohydrates you eat. Eating carbohydrates naturally increases the amount of sugar (glucose) in the blood. Counting how many carbohydrates you eat improves your blood glucose control, which helps you manage your diabetes. It is important to know how many carbohydrates you can safely have in each meal. This is different for every person. A dietitian can help you make a meal plan and calculate how many carbohydrates you should have at each meal and snack. What foods contain carbohydrates? Carbohydrates are found in the following foods:  Grains, such as breads and cereals.  Dried beans and soy products.  Starchy vegetables, such as potatoes, peas, and corn.  Fruit and fruit juices.  Milk and yogurt.  Sweets and snack foods, such as cake, cookies, candy, chips, and soft drinks.   How do I count carbohydrates in foods? There are two ways to count carbohydrates in food. You can read food labels or learn standard serving sizes of foods. You can use either of the methods or a combination of both. Using the Nutrition Facts label The Nutrition Facts list is included on the labels of almost all packaged foods and beverages in the [place] It includes:  The serving size.  Information  about nutrients in each serving, including the grams (g) of carbohydrate per serving. To use the Nutrition Facts:  Decide how many servings you will have.  Multiply the number of servings by the number of carbohydrates per serving.  The resulting number is the total amount of carbohydrates that you will be having. Learning the standard serving sizes of foods When you eat carbohydrate foods that are not packaged or do not include Nutrition Facts on the label, you need to measure the servings in order to count the amount of carbohydrates.  Measure the foods that you will eat with a food scale or measuring cup, if needed.  Decide how many standard-size servings you will eat.  Multiply the number of servings by 15. For foods that contain carbohydrates, one serving equals 15 g of carbohydrates. ? For example, if you eat 2 cups or 10 oz (300 g) of strawberries, you will have eaten 2 servings and 30 g of carbohydrates (2 servings x 15 g = 30 g).  For foods that have more than one food mixed, such as soups and casseroles, you must count the carbohydrates in each food that is included. The following list contains standard serving sizes of common carbohydrate-rich foods. Each of these servings has about 15 g of carbohydrates:  1 slice of bread.  1 six-inch (15 cm) tortilla.  ? cup or 2 oz (53 g) cooked rice or pasta.   cup or 3 oz (85 g) cooked or canned, drained and rinsed beans or lentils.   cup or 3 oz (85 g) starchy vegetable, such as peas, corn, or squash.   cup or 4 oz (120 g) hot cereal.   cup or 3 oz (85 g) boiled or mashed potatoes, or  or  3 oz (85 g) of a large baked potato.   cup or 4 fl oz (118 mL) fruit juice.  1 cup or 8 fl oz (237 mL) milk.  1 small or 4 oz (106 g) apple.   or 2 oz (63 g) of a medium banana.  1 cup or 5 oz (150 g) strawberries.  3 cups or 1 oz (24 g) popped popcorn. What is an example of carbohydrate counting? To calculate the number of  carbohydrates in this sample meal, follow the steps shown below. Sample meal  3 oz (85 g) chicken breast.  ? cup or 4 oz (106 g) brown rice.   cup or 3 oz (85 g) corn.  1 cup or 8 fl oz (237 mL) milk.  1 cup or 5 oz (150 g) strawberries with sugar-free whipped topping. Carbohydrate calculation 1. Identify the foods that contain carbohydrates: ? Rice. ? Corn. ? Milk. ? Strawberries. 2. Calculate how many servings you have of each food: ? 2 servings rice. ? 1 serving corn. ? 1 serving milk. ? 1 serving strawberries. 3. Multiply each number of servings by 15 g: ? 2 servings rice x 15 g = 30 g. ? 1 serving corn x 15 g = 15 g. ? 1 serving milk x 15 g = 15 g. ? 1 serving strawberries x 15 g = 15 g. 4. Add together all of the amounts to find the total grams of carbohydrates eaten: ? 30 g + 15 g + 15 g + 15 g = 75 g of carbohydrates total. What are tips for following this plan? Shopping  Develop a meal plan and then make a shopping list.  Buy fresh and frozen vegetables, fresh and frozen fruit, dairy, eggs, beans, lentils, and whole grains.  Look at food labels. Choose foods that have more fiber and less sugar.  Avoid processed foods and foods with added sugars. Meal planning  Aim to have the same amount of carbohydrates at each meal and for each snack time.  Plan to have regular, balanced meals and snacks. Where to find more information  American Diabetes Association: www.diabetes.org  Centers for Disease Control and Prevention: http://www.wolf.info/ Summary  Carbohydrate counting is a method of keeping track of how many carbohydrates you eat.  Eating carbohydrates naturally increases the amount of sugar (glucose) in the blood.  Counting how many carbohydrates you eat improves your blood glucose control, which helps you manage your diabetes.  A dietitian can help you make a meal plan and calculate how many carbohydrates you should have at each meal and snack. This  information is not intended to replace advice given to you by your health care provider. Make sure you discuss any questions you have with your health care provider. Document Revised: 08/27/2019 Document Reviewed: 08/28/2019 Elsevier Patient Education  2021 [place].  Diabetes Mellitus and [place] care is an important part of your health, especially when you have diabetes. Diabetes may cause you to have problems because of poor blood flow (circulation) to your feet and legs, which can cause your skin to:  Become thinner and drier.  Break more easily.  Heal more slowly.  Peel and crack. You may also have nerve damage (neuropathy) in your legs and feet, causing decreased feeling in them. This means that you may not notice minor injuries to your feet that could lead to more serious problems. Noticing and addressing any potential problems early is the best way to prevent future foot problems. How  to care for your feet Foot hygiene  Wash your feet daily with warm water and mild soap. Do not use hot water. Then, pat your feet and the areas between your toes until they are completely dry. Do not soak your feet as this can dry your skin.  Trim your toenails straight across. Do not dig under them or around the cuticle. File the edges of your nails with an emery board or nail file.  Apply a moisturizing lotion or petroleum jelly to the skin on your feet and to dry, brittle toenails. Use lotion that does not contain alcohol and is unscented. Do not apply lotion between your toes.   Shoes and socks  Wear clean socks or stockings every day. Make sure they are not too tight. Do not wear knee-high stockings since they may decrease blood flow to your legs.  Wear shoes that fit properly and have enough cushioning. Always look in your shoes before you put them on to be sure there are no objects inside.  To break in new shoes, wear them for just a few hours a day. This prevents injuries on your  feet. Wounds, scrapes, corns, and calluses  Check your feet daily for blisters, cuts, bruises, sores, and redness. If you cannot see the bottom of your feet, use a mirror or ask someone for help.  Do not cut corns or calluses or try to remove them with medicine.  If you find a minor scrape, cut, or break in the skin on your feet, keep it and the skin around it clean and dry. You may clean these areas with mild soap and water. Do not clean the area with peroxide, alcohol, or iodine.  If you have a wound, scrape, corn, or callus on your foot, look at it several times a day to make sure it is healing and not infected. Check for: ? Redness, swelling, or pain. ? Fluid or blood. ? Warmth. ? Pus or a bad smell.   General tips  Do not cross your legs. This may decrease blood flow to your feet.  Do not use heating pads or hot water bottles on your feet. They may burn your skin. If you have lost feeling in your feet or legs, you may not know this is happening until it is too late.  Protect your feet from hot and cold by wearing shoes, such as at the beach or on hot pavement.  Schedule a complete foot exam at least once a year (annually) or more often if you have foot problems. Report any cuts, sores, or bruises to your health care provider immediately. Where to find more information  American Diabetes Association: www.diabetes.org  Association of Diabetes Care & Education Specialists: www.diabeteseducator.org Contact a health care provider if:  You have a medical condition that increases your risk of infection and you have any cuts, sores, or bruises on your feet.  You have an injury that is not healing.  You have redness on your legs or feet.  You feel burning or tingling in your legs or feet.  You have pain or cramps in your legs and feet.  Your legs or feet are numb.  Your feet always feel cold.  You have pain around any toenails. Get help right away if:  You have a wound,  scrape, corn, or callus on your foot and: ? You have pain, swelling, or redness that gets worse. ? You have fluid or blood coming from the wound, scrape, corn, or callus. ?  Your wound, scrape, corn, or callus feels warm to the touch. ? You have pus or a bad smell coming from the wound, scrape, corn, or callus. ? You have a fever. ? You have a red line going up your leg. Summary  Check your feet every day for blisters, cuts, bruises, sores, and redness.  Apply a moisturizing lotion or petroleum jelly to the skin on your feet and to dry, brittle toenails.  Wear shoes that fit properly and have enough cushioning.  If you have foot problems, report any cuts, sores, or bruises to your health care provider immediately.  Schedule a complete foot exam at least once a year (annually) or more often if you have foot problems. This information is not intended to replace advice given to you by your health care provider. Make sure you discuss any questions you have with your health care provider. Document Revised: 03/17/2020 Document Reviewed: 03/17/2020 Elsevier Patient Education  2021 [place].  Diabetes Mellitus and Nutrition, Adult When you have diabetes, or diabetes mellitus, it is very important to have healthy eating habits because your blood sugar (glucose) levels are greatly affected by what you eat and drink. Eating healthy foods in the right amounts, at about the same times every day, can help you:  Control your blood glucose.  Lower your risk of heart disease.  Improve your blood pressure.  Reach or maintain a healthy weight. What can affect my meal plan? Every person with diabetes is different, and each person has different needs for a meal plan. Your health care provider may recommend that you work with a dietitian to make a meal plan that is best for you. Your meal plan may vary depending on factors such as:  The calories you need.  The medicines you take.  Your  weight.  Your blood glucose, blood pressure, and cholesterol levels.  Your activity level.  Other health conditions you have, such as heart or kidney disease. How do carbohydrates affect me? Carbohydrates, also called carbs, affect your blood glucose level more than any other type of food. Eating carbs naturally raises the amount of glucose in your blood. Carb counting is a method for keeping track of how many carbs you eat. Counting carbs is important to keep your blood glucose at a healthy level, especially if you use insulin or take certain oral diabetes medicines. It is important to know how many carbs you can safely have in each meal. This is different for every person. Your dietitian can help you calculate how many carbs you should have at each meal and for each snack. How does alcohol affect me? Alcohol can cause a sudden decrease in blood glucose (hypoglycemia), especially if you use insulin or take certain oral diabetes medicines. Hypoglycemia can be a life-threatening condition. Symptoms of hypoglycemia, such as sleepiness, dizziness, and confusion, are similar to symptoms of having too much alcohol.  Do not drink alcohol if: ? Your health care provider tells you not to drink. ? You are pregnant, may be pregnant, or are planning to become pregnant.  If you drink alcohol: ? Do not drink on an empty stomach. ? Limit how much you use to:  0-1 drink a day for women.  0-2 drinks a day for men. ? Be aware of how much alcohol is in your drink. In the [place] one drink equals one 12 oz bottle of beer (355 mL), one 5 oz glass of wine (148 mL), or one 1 oz glass of hard liquor (  44 mL). ? Keep yourself hydrated with water, diet soda, or unsweetened iced tea.  Keep in mind that regular soda, juice, and other mixers may contain a lot of sugar and must be counted as carbs. What are tips for following this plan? Reading food labels  Start by checking the serving size on the "Nutrition Facts"  label of packaged foods and drinks. The amount of calories, carbs, fats, and other nutrients listed on the label is based on one serving of the item. Many items contain more than one serving per package.  Check the total grams (g) of carbs in one serving. You can calculate the number of servings of carbs in one serving by dividing the total carbs by 15. For example, if a food has 30 g of total carbs per serving, it would be equal to 2 servings of carbs.  Check the number of grams (g) of saturated fats and trans fats in one serving. Choose foods that have a low amount or none of these fats.  Check the number of milligrams (mg) of salt (sodium) in one serving. Most people should limit total sodium intake to less than 2,300 mg per day.  Always check the nutrition information of foods labeled as "low-fat" or "nonfat." These foods may be higher in added sugar or refined carbs and should be avoided.  Talk to your dietitian to identify your daily goals for nutrients listed on the label. Shopping  Avoid buying canned, pre-made, or processed foods. These foods tend to be high in fat, sodium, and added sugar.  Shop around the outside edge of the grocery store. This is where you will most often find fresh fruits and vegetables, bulk grains, fresh meats, and fresh dairy. Cooking  Use low-heat cooking methods, such as baking, instead of high-heat cooking methods like deep frying.  Cook using healthy oils, such as olive, canola, or sunflower oil.  Avoid cooking with butter, cream, or high-fat meats. Meal planning  Eat meals and snacks regularly, preferably at the same times every day. Avoid going long periods of time without eating.  Eat foods that are high in fiber, such as fresh fruits, vegetables, beans, and whole grains. Talk with your dietitian about how many servings of carbs you can eat at each meal.  Eat 4-6 oz (112-168 g) of lean protein each day, such as lean meat, chicken, fish, eggs, or  tofu. One ounce (oz) of lean protein is equal to: ? 1 oz (28 g) of meat, chicken, or fish. ? 1 egg. ?  cup (62 g) of tofu.  Eat some foods each day that contain healthy fats, such as avocado, nuts, seeds, and fish.   What foods should I eat? Fruits Berries. Apples. Oranges. Peaches. Apricots. Plums. Grapes. Mango. Papaya. Pomegranate. Kiwi. Cherries. Vegetables Lettuce. Spinach. Leafy greens, including kale, chard, collard greens, and mustard greens. Beets. Cauliflower. Cabbage. Broccoli. Carrots. Green beans. Tomatoes. Peppers. Onions. Cucumbers. Brussels sprouts. Grains Whole grains, such as whole-wheat or whole-grain bread, crackers, tortillas, cereal, and pasta. Unsweetened oatmeal. Quinoa. Brown or wild rice. Meats and other proteins Seafood. Poultry without skin. Lean cuts of poultry and beef. Tofu. Nuts. Seeds. Dairy Low-fat or fat-free dairy products such as milk, yogurt, and cheese. The items listed above may not be a complete list of foods and beverages you can eat. Contact a dietitian for more information. What foods should I avoid? Fruits Fruits canned with syrup. Vegetables Canned vegetables. Frozen vegetables with butter or cream sauce. Grains Refined white flour  and flour products such as bread, pasta, snack foods, and cereals. Avoid all processed foods. Meats and other proteins Fatty cuts of meat. Poultry with skin. Breaded or fried meats. Processed meat. Avoid saturated fats. Dairy Full-fat yogurt, cheese, or milk. Beverages Sweetened drinks, such as soda or iced tea. The items listed above may not be a complete list of foods and beverages you should avoid. Contact a dietitian for more information. Questions to ask a health care provider  Do I need to meet with a diabetes educator?  Do I need to meet with a dietitian?  What number can I call if I have questions?  When are the best times to check my blood glucose? Where to find more information:  American  Diabetes Association: diabetes.org  Academy of Nutrition and Dietetics: www.eatright.[employer] of Diabetes and Digestive and Kidney Diseases: [place].dk  Association of Diabetes Care and Education Specialists: www.diabeteseducator.org Summary  It is important to have healthy eating habits because your blood sugar (glucose) levels are greatly affected by what you eat and drink.  A healthy meal plan will help you control your blood glucose and maintain a healthy lifestyle.  Your health care provider may recommend that you work with a dietitian to make a meal plan that is best for you.  Keep in mind that carbohydrates (carbs) and alcohol have immediate effects on your blood glucose levels. It is important to count carbs and to use alcohol carefully. This information is not intended to replace advice given to you by your health care provider. Make sure you discuss any questions you have with your health care provider. Document Revised: 08/04/2019 Document Reviewed: 08/04/2019 Elsevier Patient Education  2021 [place].  [email or web address]">  DASH Eating Plan DASH stands for Dietary Approaches to Stop Hypertension. The DASH eating plan is a healthy eating plan that has been shown to:  Reduce high blood pressure (hypertension).  Reduce your risk for type 2 diabetes, heart disease, and stroke.  Help with weight loss. What are tips for following this plan? Reading food labels  Check food labels for the amount of salt (sodium) per serving. Choose foods with less than 5 percent of the Daily Value of sodium. Generally, foods with less than 300 milligrams (mg) of sodium per serving fit into this eating plan.  To find whole grains, look for the word "whole" as the first word in the ingredient list. Shopping  Buy products labeled as "low-sodium" or "no salt added."  Buy fresh foods. Avoid canned foods and pre-made or  frozen meals. Cooking  Avoid adding salt when cooking. Use salt-free seasonings or herbs instead of table salt or sea salt. Check with your health care provider or pharmacist before using salt substitutes.  Do not fry foods. Cook foods using healthy methods such as baking, boiling, grilling, roasting, and broiling instead.  Cook with heart-healthy oils, such as olive, canola, avocado, soybean, or sunflower oil. Meal planning  Eat a balanced diet that includes: ? 4 or more servings of fruits and 4 or more servings of vegetables each day. Try to fill one-half of your plate with fruits and vegetables. ? 6-8 servings of whole grains each day. ? Less than 6 oz (170 g) of lean meat, poultry, or fish each day. A 3-oz (85-g) serving of meat is about the same size as a deck of cards. One egg equals 1 oz (28 g). ? 2-3 servings of low-fat dairy each day. One serving is 1 cup (237 mL). ?  1 serving of nuts, seeds, or beans 5 times each week. ? 2-3 servings of heart-healthy fats. Healthy fats called omega-3 fatty acids are found in foods such as walnuts, flaxseeds, fortified milks, and eggs. These fats are also found in cold-water fish, such as sardines, salmon, and mackerel.  Limit how much you eat of: ? Canned or prepackaged foods. ? Food that is high in trans fat, such as some fried foods. ? Food that is high in saturated fat, such as fatty meat. ? Desserts and other sweets, sugary drinks, and other foods with added sugar. ? Full-fat dairy products.  Do not salt foods before eating.  Do not eat more than 4 egg yolks a week.  Try to eat at least 2 vegetarian meals a week.  Eat more home-cooked food and less restaurant, buffet, and fast food.   Lifestyle  When eating at a restaurant, ask that your food be prepared with less salt or no salt, if possible.  If you drink alcohol: ? Limit how much you use to:  0-1 drink a day for women who are not pregnant.  0-2 drinks a day for men. ? Be  aware of how much alcohol is in your drink. In the [place] one drink equals one 12 oz bottle of beer (355 mL), one 5 oz glass of wine (148 mL), or one 1 oz glass of hard liquor (44 mL). General information  Avoid eating more than 2,300 mg of salt a day. If you have hypertension, you may need to reduce your sodium intake to 1,500 mg a day.  Work with your health care provider to maintain a healthy body weight or to lose weight. Ask what an ideal weight is for you.  Get at least 30 minutes of exercise that causes your heart to beat faster (aerobic exercise) most days of the week. Activities may include walking, swimming, or biking.  Work with your health care provider or dietitian to adjust your eating plan to your individual calorie needs. What foods should I eat? Fruits All fresh, dried, or frozen fruit. Canned fruit in natural juice (without added sugar). Vegetables Fresh or frozen vegetables (raw, steamed, roasted, or grilled). Low-sodium or reduced-sodium tomato and vegetable juice. Low-sodium or reduced-sodium tomato sauce and tomato paste. Low-sodium or reduced-sodium canned vegetables. Grains Whole-grain or whole-wheat bread. Whole-grain or whole-wheat pasta. Brown rice. [place]. Bulgur. Whole-grain and low-sodium cereals. Pita bread. Low-fat, low-sodium crackers. Whole-wheat flour tortillas. Meats and other proteins Skinless chicken or [place]. Ground chicken or [place]. Pork with fat trimmed off. Fish and seafood. Egg whites. Dried beans, peas, or lentils. Unsalted nuts, nut butters, and seeds. Unsalted canned beans. Lean cuts of beef with fat trimmed off. Low-sodium, lean precooked or cured meat, such as sausages or meat loaves. Dairy Low-fat (1%) or fat-free (skim) milk. Reduced-fat, low-fat, or fat-free cheeses. Nonfat, low-sodium ricotta or cottage cheese. Low-fat or nonfat yogurt. Low-fat, low-sodium cheese. Fats and oils Soft margarine without trans fats. Vegetable oil.  Reduced-fat, low-fat, or light mayonnaise and salad dressings (reduced-sodium). Canola, safflower, olive, avocado, soybean, and sunflower oils. Avocado. Seasonings and condiments Herbs. Spices. Seasoning mixes without salt. Other foods Unsalted popcorn and pretzels. Fat-free sweets. The items listed above may not be a complete list of foods and beverages you can eat. Contact a dietitian for more information. What foods should I avoid? Fruits Canned fruit in a light or heavy syrup. Fried fruit. Fruit in cream or butter sauce. Vegetables Creamed or fried vegetables. Vegetables in a  cheese sauce. Regular canned vegetables (not low-sodium or reduced-sodium). Regular canned tomato sauce and paste (not low-sodium or reduced-sodium). Regular tomato and vegetable juice (not low-sodium or reduced-sodium). Angie Fava. Olives. Grains Baked goods made with fat, such as croissants, muffins, or some breads. Dry pasta or rice meal packs. Meats and other proteins Fatty cuts of meat. Ribs. Fried meat. Berniece Salines. Bologna, salami, and other precooked or cured meats, such as sausages or meat loaves. Fat from the back of a pig (fatback). Bratwurst. Salted nuts and seeds. Canned beans with added salt. Canned or smoked fish. Whole eggs or egg yolks. Chicken or [place] with skin. Dairy Whole or 2% milk, cream, and half-and-half. Whole or full-fat cream cheese. Whole-fat or sweetened yogurt. Full-fat cheese. Nondairy creamers. Whipped toppings. Processed cheese and cheese spreads. Fats and oils Butter. Stick margarine. Lard. Shortening. Ghee. Bacon fat. Tropical oils, such as coconut, palm kernel, or palm oil. Seasonings and condiments Onion salt, garlic salt, seasoned salt, table salt, and sea salt. Worcestershire sauce. Tartar sauce. Barbecue sauce. Teriyaki sauce. Soy sauce, including reduced-sodium. Steak sauce. Canned and packaged gravies. Fish sauce. Oyster sauce. Cocktail sauce. Store-bought horseradish. Ketchup. Mustard.  Meat flavorings and tenderizers. Bouillon cubes. Hot sauces. Pre-made or packaged marinades. Pre-made or packaged taco seasonings. Relishes. Regular salad dressings. Other foods Salted popcorn and pretzels. The items listed above may not be a complete list of foods and beverages you should avoid. Contact a dietitian for more information. Where to find more information  National Heart, Lung, and Blood Institute: https://wilson-eaton.com/  American Heart Association: www.heart.org  Academy of Nutrition and Dietetics: www.eatright.[place]: www.kidney.org Summary  The DASH eating plan is a healthy eating plan that has been shown to reduce high blood pressure (hypertension). It may also reduce your risk for type 2 diabetes, heart disease, and stroke.  When on the DASH eating plan, aim to eat more fresh fruits and vegetables, whole grains, lean proteins, low-fat dairy, and heart-healthy fats.  With the DASH eating plan, you should limit salt (sodium) intake to 2,300 mg a day. If you have hypertension, you may need to reduce your sodium intake to 1,500 mg a day.  Work with your health care provider or dietitian to adjust your eating plan to your individual calorie needs. This information is not intended to replace advice given to you by your health care provider. Make sure you discuss any questions you have with your health care provider. Document Revised: 07/31/2019 Document Reviewed: 07/31/2019 Elsevier Patient Education  2021 [employer].

## 2021-02-07 NOTE — Chronic Care Management (AMB) (Signed)
Chronic Care Management   CCM RN Visit Note  02/07/2021 Name: Andrew Pruitt MRN: [id number] DOB: 1956-03-25  Subjective: Andrew Pruitt is a 65 y.o. year old male who is a primary care patient of Jon Billings, NP. The care management team was consulted for assistance with disease management and care coordination needs.    Engaged with patient by telephone for initial visit in response to provider referral for case management and/or care coordination services.   Consent to Services:  The patient was given the following information about Chronic Care Management services today, agreed to services, and gave verbal consent: 1. CCM service includes personalized support from designated clinical staff supervised by the primary care provider, including individualized plan of care and coordination with other care providers 2. 24/7 contact phone numbers for assistance for urgent and routine care needs. 3. Service will only be billed when office clinical staff spend 20 minutes or more in a month to coordinate care. 4. Only one practitioner may furnish and bill the service in a calendar month. 5.The patient may stop CCM services at any time (effective at the end of the month) by phone call to the office staff. 6. The patient will be responsible for cost sharing (co-pay) of up to 20% of the service fee (after annual deductible is met). Patient agreed to services and consent obtained.  Patient agreed to services and verbal consent obtained.   Assessment: Review of patient past medical history, allergies, medications, health status, including review of consultants reports, laboratory and other test data, was performed as part of comprehensive evaluation and provision of chronic care management services.   SDOH (Social Determinants of Health) assessments and interventions performed:  SDOH Interventions   Flowsheet Row Most Recent Value  SDOH Interventions   Physical Activity Interventions Other (Comments)   [the pateint use to go to the gym]       CCM Care Plan  Allergies  Allergen Reactions  . Niaspan [Niacin] Other (See Comments)    Red skin, red eyes    Outpatient Encounter Medications as of 02/07/2021  Medication Sig  . amLODipine (NORVASC) 5 MG tablet Take 1 tablet (5 mg total) by mouth daily. Further refills will need to come from new primary care provider  . atorvastatin (LIPITOR) 80 MG tablet Take 1 tablet (80 mg total) by mouth daily.  . B-D UF III MINI PEN NEEDLES 31G X 5 MM MISC USE DAILY  . Continuous Blood Gluc Receiver (FREESTYLE LIBRE 14 DAY READER) DEVI Use to check glucose levels TID; E11.9  . Continuous Blood Gluc Sensor (FREESTYLE LIBRE 14 DAY SENSOR) MISC Use to check glucose TID. Change every 14 days. E 11.9  . diclofenac Sodium (VOLTAREN) 1 % GEL Apply 2 g topically 4 (four) times daily.  . fenofibrate (TRICOR) 145 MG tablet Take 1 tablet (145 mg total) by mouth daily.  Marland Kitchen gabapentin (NEURONTIN) 100 MG capsule Take 1 capsule (100 mg total) by mouth 3 (three) times daily.  . hydrochlorothiazide (HYDRODIURIL) 25 MG tablet TAKE 1 TABLET BY MOUTH EVERY DAY  . Insulin NPH, Human,, Isophane, (NOVOLIN N FLEXPEN) 100 UNIT/ML Kiwkpen Inject 150 Units into the skin every morning.   No facility-administered encounter medications on file as of 02/07/2021.    Patient Active Problem List   Diagnosis Date Noted  . Nuclear sclerotic cataract of both eyes 09/07/2020  . Horseshoe retinal tear, right eye 08/11/2020  . Viral warts 06/07/2020  . Diabetic macular edema of right eye with proliferative retinopathy associated  with type 2 diabetes mellitus ([place]) 04/20/2020  . Proliferative diabetic retinopathy of right eye ([place]) 03/02/2020  . Type 2 diabetes mellitus with proliferative diabetic retinopathy of left eye without macular edema ([place]) 03/02/2020  . Diabetic neuropathy associated with type 2 diabetes mellitus ([place]) 02/22/2020  . Uncontrolled type II diabetes mellitus with chronic  kidney disease ([place]) 02/22/2020  . Hypertension associated with diabetes ([place]) 02/22/2020  . Hyperlipidemia associated with type 2 diabetes mellitus ([place]) 02/22/2020  . Prostate cancer screening 02/22/2020  . Plantar warts 02/22/2020    Conditions to be addressed/monitored:HTN, HLD and DMII  Care Plan : RNCM: Diabetes Type 2 (Adult)  Updates made by Vanita Ingles since 02/07/2021 12:00 AM    Problem: RNCM: Glycemic Management (Diabetes, Type 2)   Priority: High    Long-Range Goal: RNCM: Glycemic Management Optimized   Priority: High  Note:   Objective:  Lab Results  Component Value Date   HGBA1C 11.8 (H) 01/24/2021 .   Lab Results  Component Value Date   CREATININE 1.55 (H) 02/03/2021   CREATININE 1.69 (H) 01/24/2021   CREATININE 1.26 02/11/2020 .   Lab Results  Component Value Date   EGFR 49 (L) 02/03/2021 .   Current Barriers:  Marland Kitchen Knowledge Deficits related to basic Diabetes pathophysiology and self care/management . Knowledge Deficits related to medications used for management of diabetes . 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 Unable to self administer medications as prescribed- unable to afford Ozempic . Does not adhere to prescribed medication regimen Case Manager Clinical Goal(s):  . patient will demonstrate improved adherence to prescribed treatment plan for diabetes self care/management as evidenced by: daily monitoring and recording of CBG  adherence to ADA/ carb modified diet exercise 5 days/week adherence to prescribed medication regimen contacting provider for new or worsened symptoms or questions work with pharm D to see if there are programs to help with affording Ozempic- appointment 02-15-2021 Interventions:  . Collaboration with Jon Billings, NP regarding development and update of comprehensive plan of care as evidenced by  provider attestation and co-signature . Inter-disciplinary care team collaboration (see longitudinal plan of care) . Provided education to patient about basic DM disease process. Will send information by the EMMI system and My Chart on dietary restrictions for DM and heart healthy diet. As well as exercise and pacing activity  . Reviewed medications with patient and discussed importance of medication adherence. The patient can not afford the Ozempic. States the endocrinologist has upped his insulin up to 150 units a day and he has no energy and can not do exercise. He has gained 40 pounds. His usual weight is 260 and he weights 300 pounds and is 6'0". Appointment with pharm D to help with cost constraints of Ozempic on 02-15-2021.  Marland Kitchen Discussed plans with patient for ongoing care management follow up and provided patient with direct contact information for care management team . Provided patient with written educational materials related to hypo and hyperglycemia and importance of correct treatment. Review of fasting blood sugars or <130 and post prandial of <180. The patient states his range in am is 88 and other times 250-350 . Reviewed scheduled/upcoming provider appointments including: 05-01-2021 at 0900 am . Advised patient, providing education and rationale, to check cbg tid and record, calling pcp/endocrinologist  for findings outside established parameters.   . Referral made to pharmacy team for assistance with  cost of Ozempic and the patients cost constraints.  . Review of patient status, including review of consultants reports, relevant laboratory and other test results, and medications completed. Self-Care Activities - UNABLE to independently manage DM Self administers oral medications as prescribed Self administers insulin as prescribed Attends all scheduled provider appointments Checks blood sugars as prescribed and utilize hyper and hypoglycemia protocol as needed Adheres to prescribed  ADA/carb modified Patient Goals: - check blood sugar at prescribed times - check blood sugar before and after exercise - check blood sugar if I feel it is too high or too low - enter blood sugar readings and medication or insulin into daily log - take the blood sugar log to all doctor visits - change to whole grain breads, cereal, pasta - set goal weight - drink 6 to 8 glasses of water each day - eat fish at least once per week - fill half of plate with vegetables - limit fast food meals to no more than 1 per week - manage portion size - prepare main meal at home 3 to 5 days each week - read food labels for fat, fiber, carbohydrates and portion size - set a realistic goal - schedule appointment with eye doctor - check feet daily for cuts, sores or redness - keep feet up while sitting - trim toenails straight across - wash and dry feet carefully every day - wear comfortable, cotton socks - wear comfortable, well-fitting shoes - barriers to adherence to treatment plan identified - blood glucose readings reviewed - individualized medical nutrition therapy provided - mutual A1C goal set or reviewed - resources required to improve adherence to care identified - self-awareness of signs/symptoms of hypo or hyperglycemia encouraged - use of blood glucose monitoring log promoted Follow Up Plan: Telephone follow up appointment with care management team member scheduled for: 03-28-2021 at 230 pm   imeframe:  Long-Range Goal Priority:  High Start Date: 02-07-2021                            Expected End Date:    02-07-2022                   Follow Up Date 03-28-2021   - check blood sugar at prescribed times - check blood sugar before and after exercise - check blood sugar if I feel it is too high or too low - enter blood sugar readings and medication or insulin into daily log - take the blood sugar log to all doctor visits - take the blood sugar meter to all doctor visits    Why is this  important?    Checking your blood sugar at home helps to keep it from getting very high or very low.   Writing the results in a diary or log helps the doctor know how to care for you.   Your blood sugar log should have the time, date and the results.   Also, write down the amount of insulin or other medicine that you take.   Other information, like what you ate, exercise done and how you were feeling, will also be helpful.     Notes: Patient with hemoglobin A1C of 11.8 on 01-24-2021- blood sugar reading range 88 to 350.    Task: RNCM: Alleviate Barriers to Glycemic Management   Note:   Care Management Activities:    - barriers to adherence to treatment plan identified - blood glucose readings reviewed -  individualized medical nutrition therapy provided - mutual A1C goal set or reviewed - resources required to improve adherence to care identified - self-awareness of signs/symptoms of hypo or hyperglycemia encouraged - use of blood glucose monitoring log promoted       Care Plan : RNCM: Hypertension (Adult)  Updates made by Vanita Ingles since 02/07/2021 12:00 AM    Problem: RNCM: Hypertension (Hypertension)   Priority: Medium    Long-Range Goal: RNCM: Hypertension Monitored   Priority: Medium  Note:   Objective:  . Last practice recorded BP readings:  BP Readings from Last 3 Encounters:  01/24/21 130/82  01/03/21 (!) 150/70  12/21/20 (!) 150/81 .   Marland Kitchen Most recent eGFR/CrCl:  Lab Results  Component Value Date   EGFR 49 (L) 02/03/2021 .    No components found for: CRCL Current Barriers:  Marland Kitchen Knowledge Deficits related to basic understanding of hypertension pathophysiology and self care management . Knowledge Deficits related to understanding of medications prescribed for management of hypertension . Film/video editor.  . Unable to independently manage HTN . Does not maintain contact with provider office . Does not contact provider office for  questions/concerns Case Manager Clinical Goal(s):  . patient will verbalize understanding of plan for hypertension management . patient will attend all scheduled medical appointments: 05-01-2021 at 0900 am . patient will demonstrate improved adherence to prescribed treatment plan for hypertension as evidenced by taking all medications as prescribed, monitoring and recording blood pressure as directed, adhering to low sodium/DASH diet . patient will demonstrate improved health management independence as evidenced by checking blood pressure as directed and notifying PCP if SBP>150 or DBP > 90, taking all medications as prescribe, and adhering to a low sodium diet as discussed. . patient will verbalize basic understanding of hypertension disease process and self health management plan as evidenced by compliance with Heart healthy/ADA diet, compliance with medications regiment, and working with the CCM team to effectively manage health and well being.  Interventions:  . Collaboration with Jon Billings, NP regarding development and update of comprehensive plan of care as evidenced by provider attestation and co-signature . Inter-disciplinary care team collaboration (see longitudinal plan of care) . Evaluation of current treatment plan related to hypertension self management and patient's adherence to plan as established by provider. . Provided education to patient re: stroke prevention, s/s of heart attack and stroke, DASH diet, complications of uncontrolled blood pressure . Reviewed medications with patient and discussed importance of compliance . Discussed plans with patient for ongoing care management follow up and provided patient with direct contact information for care management team . Advised patient, providing education and rationale, to monitor blood pressure daily and record, calling PCP for findings outside established parameters.  . Reviewed scheduled/upcoming provider appointments  including: 05-01-2021 at 0900 am Self-Care Activities: - Self administers medications as prescribed Attends all scheduled provider appointments Calls provider office for new concerns, questions, or BP outside discussed parameters Checks BP and records as discussed Follows a low sodium diet/DASH diet Patient Goals: - check blood pressure weekly - choose a place to take my blood pressure (home, clinic or office, retail store) - write blood pressure results in a log or diary - agree on reward when goals are met - agree to work together to make changes - ask questions to understand - learn about high blood pressure - blood pressure trends reviewed - depression screen reviewed - home or ambulatory blood pressure monitoring encouraged Follow Up Plan: Telephone follow up appointment with  care management team member scheduled for: 03-28-2021 at 230 pm    Task: RNCM: Identify and Monitor Blood Pressure Elevation   Note:   Care Management Activities:    - blood pressure trends reviewed - depression screen reviewed - home or ambulatory blood pressure monitoring encouraged     Care Plan : RNCM: HLD Management  Updates made by Vanita Ingles since 02/07/2021 12:00 AM    Problem: RNCM; HLD management   Priority: High    Long-Range Goal: Self-Management Plan Developed   Priority: High  Note:   Current Barriers:  . Poorly controlled hyperlipidemia, complicated by DM, low energy level, insomnia . Current antihyperlipidemic regimen: Atorvastatin 58m and Fenofibrate 145 mg daily . Most recent lipid panel:     Component Value Date/Time   CHOL 200 (H) 01/24/2021 1030   TRIG 1,075 (HH) 01/24/2021 1030   HDL 21 (L) 01/24/2021 1030   CHOLHDL 9.5 (H) 01/24/2021 1030   CHOLHDL 12 02/11/2020 1037   LDLCALC Comment (A) 01/24/2021 1030   LDLDIRECT 81.0 02/11/2020 1037 .   .Marland KitchenASCVD risk enhancing conditions: age >>35 DM, HTN . Unable to independently manage HLD as evidence of abnormal  labs . Picky eater, does not like vegetables  RN Care Manager Clinical Goal(s):  . patient will work with RConsulting civil engineer providers, and care team towards execution of optimized self-health management plan . patient will verbalize understanding of plan for effective management of HLD  . [facility]  to address needs related to effective management of HLD  . patient will attend all scheduled medical appointments: 05-01-2021 at 0900 am . patient will demonstrate improved adherence to prescribed treatment plan for HLD as evidenced bypostivie response to medications regimen and lab work trending toward normal limits.  Interventions: . Collaboration with HJon Billings NP regarding development and update of comprehensive plan of care as evidenced by provider attestation and co-signature . Inter-disciplinary care team collaboration (see longitudinal plan of care) . Medication review performed; medication list updated in electronic medical record. The patient has had changes in his medications regimen for HLD. He is taking the atorvastatin 80 mg and fenofibrate 145 mg daily without difficulty. No cost constraints with HLD medications regimen.  .[facility] team collaboration (see longitudinal plan of care) . Referred to pharmacy team for assistance with HLD medication management- has an appointment on 02-15-2021 with the pharmacist for medications reconciliation and concerns  . Evaluation of current treatment plan related to HLD and patient's adherence to plan as established by provider. . Advised patient to work on dietary changes, call the office for changes or questions.  . Provided education to patient re: heart healthy/ADA diet, review of food choices due to the patients picky eating habits. Review of exercise routine. The patient states he can not exercise longer than 10 minutes due to his energy level and not being able to do any more activity after  that.  . Reviewed medications with patient and discussed compliance. States compliance with medications for HLD  . Reviewed scheduled/upcoming provider appointments including: 05-01-2021 AT 0900 am . Discussed plans with patient for ongoing care management follow up and provided patient with direct contact information for care management team Patient Goals/Self-Care Activities: - call for medicine refill 2 or 3 days before it runs out - call if I am sick and can't take my medicine - keep a list of all the medicines I take; vitamins and herbals too - learn  to read medicine labels - use a pillbox to sort medicine - use an alarm clock or phone to remind me to take my medicine - change to whole grain breads, cereal, pasta - drink 6 to 8 glasses of water each day - eat 3 to 5 servings of fruits and vegetables each day - eat 5 or 6 small meals each day - eat fish at least once per week - fill half the plate with nonstarchy vegetables - limit fast food meals to no more than 1 per week - manage portion size - prepare main meal at home 3 to 5 days each week - read food labels for fat, fiber, carbohydrates and portion size - be open to making changes - I can manage, know and watch for signs of a heart attack - if I have chest pain, call for help - learn about small changes that will make a big difference - learn my personal risk factors - barriers to meeting goals identified - change-talk evoked - choices provided - collaboration with team encouraged - decision-making supported - health risks reviewed - problem-solving facilitated - questions answered - readiness for change evaluated - reassurance provided - self-reflection promoted - self-reliance encouraged - verbalization of feelings encouraged  Follow Up Plan: Telephone follow up appointment with care management team member scheduled for: 03-28-2021 at 230 pm     Task: RNCM: HLD Management   Note:   Care Management Activities:     - barriers to meeting goals identified - change-talk evoked - choices provided - collaboration with team encouraged - decision-making supported - health risks reviewed - problem-solving facilitated - questions answered - readiness for change evaluated - reassurance provided - self-reflection promoted - self-reliance encouraged - verbalization of feelings encouraged         Plan:Telephone follow up appointment with care management team member scheduled for:  03-28-2021 at 230 pm  Noreene Larsson RN, MSN, [facility] Mobile: [phone]

## 2021-02-10 ENCOUNTER — Telehealth: Payer: Self-pay | Admitting: Pharmacist

## 2021-02-10 NOTE — Chronic Care Management (AMB) (Signed)
Chronic Care Management Pharmacy Assistant   Name: Ayham Word  MRN: [id number] DOB: [birth year]  Andrew Pruitt is an 65 y.o. year old male who presents for his initial CCM visit with the clinical pharmacist.  Reason for Encounter: Initial CCM Call  Recent office visits:  01/24/2021 Park Liter, DO. General follow up. Patient did not start Crestor due to cost and started on Atorvastatin instead. Start on Fenofibrate 145 mg daily. Recommend trying benadryl to help with sleep. If not able to sleep with medication assistance can try trazadone and possible sleep study. Labs ordered. Follow up in 3 months. 12/21/2020 Park Liter, DO. Establishing care visit. Increased Amlodipine to 5mg  daily from Amlodipine 2.5 mg. Start on Gabapentin 100mg  daily and Voltaren Gel prn for pain. Referral to [facility] Coordination. Follow up in 1 month. Recent consult visits:  01/17/2021 Deloria Lair, MD (Ophthalmology) Retina follow up. Follow up in 4-5 months. 01/03/2021 Renato Shin, MD (Endocrinology) Diabetic follow up. Recommended to check blood sugar twice a day. Started on Ozempic 0.5 mg weekly. Follow up in 2 months. 12/07/2020 Lanae Crumbly, DPM (Podiatry) Seen for Calluses and nail problem. Considered referral to [facility]. 11/23/2020 Renato Shin, MD (Endocrinology) Diabetic follow up.  Increase the insulin to 150 units each morning form 120 units. Follow up in 1 month. 10/19/2020 Renato Shin, MD (Endocrinology) Diabetic follow up. increase the insulin to 120 units each morning from 100 units. Follow up in 1 month. 09/07/2020 Gardiner Barefoot, DPM (Podiatry) Seen for plantar wart. Debridement of the multiple skin lesions with a #15 blade and smoothing with a dermal tool. Return to Dr. Sherryle Lis for foot exam. 09/07/2020 Deloria Lair, MD (Ophthalmology) Retina Follow up. Follow up in 4 months. 08/15/2020 Renato Shin, MD (Endocrinogloy) Diabetic follow up. Increase the insulin to 100 units  each morning from 80 units. Start on Rybelsus. Follow up in 2 months. Hospital visits:  None in previous 6 months  Medications: Outpatient Encounter Medications as of 02/10/2021  Medication Sig  . amLODipine (NORVASC) 5 MG tablet Take 1 tablet (5 mg total) by mouth daily. Further refills will need to come from new primary care provider  . atorvastatin (LIPITOR) 80 MG tablet Take 1 tablet (80 mg total) by mouth daily.  . B-D UF III MINI PEN NEEDLES 31G X 5 MM MISC USE DAILY  . Continuous Blood Gluc Receiver (FREESTYLE LIBRE 14 DAY READER) DEVI Use to check glucose levels TID; E11.9  . Continuous Blood Gluc Sensor (FREESTYLE LIBRE 14 DAY SENSOR) MISC Use to check glucose TID. Change every 14 days. E 11.9  . diclofenac Sodium (VOLTAREN) 1 % GEL Apply 2 g topically 4 (four) times daily.  . fenofibrate (TRICOR) 145 MG tablet Take 1 tablet (145 mg total) by mouth daily.  Marland Kitchen gabapentin (NEURONTIN) 100 MG capsule Take 1 capsule (100 mg total) by mouth 3 (three) times daily.  . hydrochlorothiazide (HYDRODIURIL) 25 MG tablet TAKE 1 TABLET BY MOUTH EVERY DAY  . Insulin NPH, Human,, Isophane, (NOVOLIN N FLEXPEN) 100 UNIT/ML Kiwkpen Inject 150 Units into the skin every morning.   No facility-administered encounter medications on file as of 02/10/2021.     AmLODipine (NORVASC) 5 MG tablet Last filled:12/21/2020 90 DS  Atorvastatin (LIPITOR) 80 MG tablet Last filled:01/25/2021 90 DS  Diclofenac Sodium (VOLTAREN) 1 % GEL Last filled:12/21/2020 12 DS  Fenofibrate (TRICOR) 145 MG tablet Last filled: 01/25/2021 90 DS  Gabapentin (NEURONTIN) 100 MG capsule Last filled:12/21/2020  Hydrochlorothiazide (HYDRODIURIL) 25 MG tablet Last  filled: 01/09/2021 90 DS  Insulin NPH, Human,, Isophane, (NOVOLIN N FLEXPEN) 100 UNIT/ML Kiwkpen Last filled:01/31/2021 90 DS   Have you seen any other providers since your last visit?  Patient states he has not seen other providers since his last visit.  Any changes in your  medications or health?  Patient states there is no recent changes on his medications or health.  Any side effects from any medications?  Patient states he does not have any side effects from any of his medications.  Do you have an symptoms or problems not managed by your medications?  Patient states he does not have any symptoms or problems not managed by his medications.   Any concerns about your health right now?  Patient states his Endocrinologist Increased his insulin to 150 units and gained 30 lbs in less than 1 month.  Has your provider asked that you check blood pressure, blood sugar, or follow special diet at home?  Patient states yes he checks his blood sugar daily he has a [employer] monitor. Patient states he forgets to check his blood pressure but he does take his medication. Patient states he only eats meat and potatoes.   Do you get any type of exercise on a regular basis? Patient states he can barley walk due to his back pain, but he does try to walk.  Can you think of a goal you would like to reach for your health?  Patient states his goal would be to lose weight and get back to 240 lbs.  Do you have any problems getting your medications? Patient states yes only when he receives a prescription with name brand medications the price of them increase.  Is there anything that you would like to discuss during the appointment?  Patient states he would like for his diabetic medications to work due to his blood sugar recently being 240-300 range.    Star Rating Drugs: Atorvastatin (LIPITOR) 80 MG tablet Last filled:01/25/2021 90 DS  Corrie Mckusick, [place] [phone]

## 2021-02-10 NOTE — Chronic Care Management (AMB) (Signed)
    Chronic Care Management Pharmacy Assistant   Name: Andrew Pruitt  MRN: [id number] DOB: 21-Aug-1956    Reason for Encounter: Patient assistance application for Ozempic and Farxiga    Medications: Outpatient Encounter Medications as of 02/10/2021  Medication Sig  . amLODipine (NORVASC) 5 MG tablet Take 1 tablet (5 mg total) by mouth daily. Further refills will need to come from new primary care provider  . atorvastatin (LIPITOR) 80 MG tablet Take 1 tablet (80 mg total) by mouth daily.  . B-D UF III MINI PEN NEEDLES 31G X 5 MM MISC USE DAILY  . Continuous Blood Gluc Receiver (FREESTYLE LIBRE 14 DAY READER) DEVI Use to check glucose levels TID; E11.9  . Continuous Blood Gluc Sensor (FREESTYLE LIBRE 14 DAY SENSOR) MISC Use to check glucose TID. Change every 14 days. E 11.9  . diclofenac Sodium (VOLTAREN) 1 % GEL Apply 2 g topically 4 (four) times daily.  . fenofibrate (TRICOR) 145 MG tablet Take 1 tablet (145 mg total) by mouth daily.  Marland Kitchen gabapentin (NEURONTIN) 100 MG capsule Take 1 capsule (100 mg total) by mouth 3 (three) times daily.  . hydrochlorothiazide (HYDRODIURIL) 25 MG tablet TAKE 1 TABLET BY MOUTH EVERY DAY  . Insulin NPH, Human,, Isophane, (NOVOLIN N FLEXPEN) 100 UNIT/ML Kiwkpen Inject 150 Units into the skin every morning.   No facility-administered encounter medications on file as of 02/10/2021.    Completed patient assistance application form and mailed out for [place] and [place]. Patient is aware to complete both applications and to mail back or bring in to PCP's office.   Corrie Mckusick, [place]

## 2021-02-15 ENCOUNTER — Ambulatory Visit (INDEPENDENT_AMBULATORY_CARE_PROVIDER_SITE_OTHER): Payer: Medicare Other | Admitting: Pharmacist

## 2021-02-15 ENCOUNTER — Telehealth: Payer: Medicare Other

## 2021-02-15 DIAGNOSIS — E1122 Type 2 diabetes mellitus with diabetic chronic kidney disease: Secondary | ICD-10-CM | POA: Diagnosis not present

## 2021-02-15 DIAGNOSIS — E1165 Type 2 diabetes mellitus with hyperglycemia: Secondary | ICD-10-CM | POA: Diagnosis not present

## 2021-02-15 DIAGNOSIS — E1169 Type 2 diabetes mellitus with other specified complication: Secondary | ICD-10-CM | POA: Diagnosis not present

## 2021-02-15 DIAGNOSIS — IMO0002 Reserved for concepts with insufficient information to code with codable children: Secondary | ICD-10-CM

## 2021-02-15 DIAGNOSIS — E785 Hyperlipidemia, unspecified: Secondary | ICD-10-CM

## 2021-02-15 NOTE — Progress Notes (Signed)
Chronic Care Management Pharmacy Note  03/15/2021 Name:  Andrew Pruitt MRN:  [id number] DOB:  December 21, 1955   Recommendations/Changes made from today's visit: Recommend adding ACE-I to current regimen. Recommend Ozempic and Farxiga with patient assistance. Recommend  Vitamin D and Vitamin B12 levels with next labs.  Add diagnosis code for CKD and obesity.  Plan: CPA to mail PAP applications. Will supply patient with Farxiga 30 day voucher.   Subjective: Andrew Pruitt is an 65 y.o. year old male who is a primary patient of Jon Billings, NP.  The CCM team was consulted for assistance with disease management and care coordination needs.    Engaged with patient by telephone for initial visit in response to provider referral for pharmacy case management and/or care coordination services.   Consent to Services:  The patient was given the following information about Chronic Care Management services today, agreed to services, and gave verbal consent: 1. CCM service includes personalized support from designated clinical staff supervised by the primary care provider, including individualized plan of care and coordination with other care providers 2. 24/7 contact phone numbers for assistance for urgent and routine care needs. 3. Service will only be billed when office clinical staff spend 20 minutes or more in a month to coordinate care. 4. Only one practitioner may furnish and bill the service in a calendar month. 5.The patient may stop CCM services at any time (effective at the end of the month) by phone call to the office staff. 6. The patient will be responsible for cost sharing (co-pay) of up to 20% of the service fee (after annual deductible is met). Patient agreed to services and consent obtained.  Patient Care Team: Jon Billings, NP as PCP - General (Nurse Practitioner) Vanita Ingles, RN as Registered Nurse (General Practice) Vladimir Faster, [facility] (Pharmacist)  Recent office  visits: 01/24/2021 [place](PCP) General follow up. Patient did not start Crestor due to cost and started on Atorvastatin instead. Start on Fenofibrate 145 mg daily. Recommend trying benadryl to help with sleep. If not able to sleep with medication assistance can try trazadone and possible sleep study. Labs ordered. Follow up in 3 months. 12/21/2020 [place](PCP) Establishing care visit. Increased Amlodipine to $RemoveBefor'5mg'[id number]$  daily from Amlodipine 2.5 mg. Start on Gabapentin $RemoveBefor'100mg'[id number]$  daily and Voltaren Gel prn for pain. Referral to [facility] Coordination. Follow up in 1 month.  Recent consult visits: 01/17/2021 Deloria Lair, MD (Ophthalmology) Retina follow up. Follow up in 4-5 months. CSME OD reslolver 01/03/2021 Renato Shin, MD (Endocrinology) Diabetic follow up. Recommended to check blood sugar twice a day. Started on Ozempic 0.5 mg weekly. Follow up in 2 months. 12/07/2020 Lanae Crumbly, DPM (Podiatry) Seen for Calluses and nail problem. Considered referral to [facility]. 11/23/2020 Renato Shin, MD (Endocrinology) Diabetic follow up.  Increase the insulin to 150 units each morning form 120 units. Follow up in 1 month. 10/19/2020 Renato Shin, MD (Endocrinology) Diabetic follow up. increase the insulin to 120 units each morning from 100 units. Follow up in 1 month. 09/07/2020 Gardiner Barefoot, DPM (Podiatry) Seen for plantar wart. Debridement of the multiple skin lesions with a #15 blade and smoothing with a dermal tool. Return to Dr. Sherryle Lis for foot exam. 09/07/2020 Deloria Lair, MD (Ophthalmology) Retina Follow up. Follow up in 4 months. 08/15/2020 Renato Shin, MD (Endocrinogloy) Diabetic follow up. Increase the insulin to 100 units each morning from 80 units. Start on Rybelsus. Follow up in 2 months.  Hospital visits: None in previous 6 months  Objective:  Lab Results  Component Value Date   CREATININE 1.55 (H) 02/03/2021   BUN 19 02/03/2021   GFR 57.44 (L) 02/11/2020   NA 140  02/03/2021   K 3.9 02/03/2021   CALCIUM 9.6 02/03/2021   CO2 26 02/03/2021   GLUCOSE 221 (H) 02/03/2021    Lab Results  Component Value Date/Time   HGBA1C 12.3 (A) 03/09/2021 09:46 AM   HGBA1C 11.8 (H) 01/24/2021 10:30 AM   HGBA1C 11.8 (A) 01/03/2021 09:57 AM   GFR 57.44 (L) 02/11/2020 10:37 AM   MICROALBUR 5.7 (H) 02/11/2020 10:37 AM    Last diabetic Eye exam:  Lab Results  Component Value Date/Time   HMDIABEYEEXA Retinopathy (A) 01/17/2021 12:00 AM    Last diabetic Foot exam: No results found for: HMDIABFOOTEX   Lab Results  Component Value Date   CHOL 200 (H) 01/24/2021   HDL 21 (L) 01/24/2021   LDLCALC Comment (A) 01/24/2021   LDLDIRECT 81.0 02/11/2020   TRIG 1,075 (HH) 01/24/2021   CHOLHDL 9.5 (H) 01/24/2021    Hepatic Function Latest Ref Rng & Units 02/03/2021 02/11/2020  Total Protein 6.0 - 8.5 g/dL 6.7 6.8  Albumin 3.8 - 4.8 g/dL 4.4 4.4  AST 0 - 40 IU/L 22 15  ALT 0 - 44 IU/L 34 17  Alk Phosphatase 44 - 121 IU/L 57 80  Total Bilirubin 0.0 - 1.2 mg/dL 0.6 0.7    Lab Results  Component Value Date/Time   TSH 2.95 02/11/2020 10:37 AM    CBC Latest Ref Rng & Units 02/11/2020  WBC 4.0 - 10.5 K/uL 4.7  Hemoglobin 13.0 - 17.0 g/dL 17.4(H)  Hematocrit 39.0 - 52.0 % 49.5  Platelets 150.0 - 400.0 K/uL 172.0 Repeated and verified X2.    No results found for: VD25OH  Clinical ASCVD: No  The 10-year ASCVD risk score Mikey Bussing DC Jr., et al., 2013) is: 52%   Values used to calculate the score:     Age: 65 years     Sex: Male     Is Non-Hispanic African American: No     Diabetic: Yes     Tobacco smoker: No     Systolic Blood Pressure: 923 mmHg     Is BP treated: Yes     HDL Cholesterol: 21 mg/dL     Total Cholesterol: 200 mg/dL    Depression screen [facility] 2/9 02/07/2021 12/21/2020 06/24/2020  Decreased Interest 0 0 0  Down, Depressed, Hopeless 0 0 0  PHQ - 2 Score 0 0 0       Social History   Tobacco Use  Smoking Status Never  Smokeless Tobacco Never   BP  Readings from Last 3 Encounters:  03/09/21 (!) 150/76  01/24/21 130/82  01/03/21 (!) 150/70   Pulse Readings from Last 3 Encounters:  03/09/21 81  01/24/21 83  01/03/21 82   Wt Readings from Last 3 Encounters:  03/09/21 286 lb (129.7 kg)  01/24/21 292 lb (132.5 kg)  01/03/21 298 lb 9.6 oz (135.4 kg)   BMI Readings from Last 3 Encounters:  03/09/21 38.79 kg/m  01/24/21 41.90 kg/m  01/03/21 42.84 kg/m    Assessment/Interventions: Review of patient past medical history, allergies, medications, health status, including review of consultants reports, laboratory and other test data, was performed as part of comprehensive evaluation and provision of chronic care management services.   SDOH:  (Social Determinants of Health) assessments and interventions performed: Yes SDOH Interventions    Flowsheet Row Most Recent Value  SDOH  Interventions   Financial Strain Interventions Other (Comment)  [patient assistance]      SDOH Screenings   Alcohol Screen: Low Risk    Last Alcohol Screening Score (AUDIT): 1  Depression (PHQ2-9): Low Risk    PHQ-2 Score: 0  Financial Resource Strain: Low Risk    Difficulty of Paying Living Expenses: Not very hard  Food Insecurity: No Food Insecurity   Worried About Charity fundraiser in the Last Year: Never true   Ran Out of Food in the Last Year: Never true  Housing: Low Risk    Last Housing Risk Score: 0  Physical Activity: Inactive   Days of Exercise per Week: 0 days   Minutes of Exercise per Session: 0 min  Social Connections: Moderately Isolated   Frequency of Communication with Friends and Family: More than three times a week   Frequency of Social Gatherings with Friends and Family: More than three times a week   Attends Religious Services: Never   Marine scientist or Organizations: No   Attends Archivist Meetings: Never   Marital Status: Married  Stress: No Stress Concern Present   Feeling of Stress : Only a little   Tobacco Use: Low Risk    Smoking Tobacco Use: Never   Smokeless Tobacco Use: Never  Transportation Needs: No Transportation Needs   Lack of Transportation (Medical): No   Lack of Transportation (Non-Medical): No      Immunization History  Administered Date(s) Administered   Fluad Quad(high Dose 65+) 06/24/2020   Moderna Sars-Covid-2 Vaccination 10/13/2019, 11/09/2019, 08/18/2020   Pneumococcal Conjugate-13 03/25/2020   Pneumococcal Polysaccharide-23 08/24/2010   Tdap 08/24/2010    Conditions to be addressed/monitored:  Hypertension, Hyperlipidemia, Diabetes and Chronic Kidney Disease    Care Plan : [place]  Updates made by Vladimir Faster, [place] since 03/15/2021 12:00 AM     Problem: uncontrolledDM2, obesity, HTN, HLD, newuropathy, retinopathy   Priority: High     Goal: Patient-Specific Goal        Medication Assistance: Application for Ozempic and Farxiga  medication assistance program. in process.  Anticipated assistance start date unknown.  See plan of care for additional detail.  Compliance/Adherence/Medication fill history: Care Gaps: Shingrix vaccine  Star-Rating Drugs: AmLODipine (NORVASC) 5 MG tablet Last filled:12/21/2020 90 DS Atorvastatin (LIPITOR) 80 MG tablet Last filled:01/25/2021 90 DS Diclofenac Sodium (VOLTAREN) 1 % GEL Last filled:12/21/2020 12 DS Fenofibrate (TRICOR) 145 MG tablet Last filled: 01/25/2021 90 DS Gabapentin (NEURONTIN) 100 MG capsule Last filled:12/21/2020 Hydrochlorothiazide (HYDRODIURIL) 25 MG tablet Last filled: 01/09/2021 90 DS Insulin NPH, Human,, Isophane, (NOVOLIN N FLEXPEN) 100 UNIT/ML Kiwkpen Last filled:01/31/2021 90 DS    Patient's preferred pharmacy is:  [id number] Lorina Rabon, [place] [address] [place] [address] Phone: [phone] Fax: [phone]  [id number] [place], [place] - [address]  [place] [address] Phone: [phone] Fax: [phone]  Uses pill box? Yes-  Pt endorses 80% compliance  We discussed: Benefits of medication synchronization, packaging and delivery as well as enhanced pharmacist oversight with [employer]. Patient decided to: Continue current medication management strategy  Care Plan and Follow Up Patient Decision:  Patient agrees to Care Plan and Follow-up.  Plan: Telephone follow up appointment with care management team member scheduled for:  CPA  4-6 weekls  Junita Push. Kenton Kingfisher PharmD, BCPS Clinical Pharmacist [facility]  [phone]     

## 2021-02-16 ENCOUNTER — Telehealth: Payer: Self-pay | Admitting: Nurse Practitioner

## 2021-02-16 MED ORDER — DAPAGLIFLOZIN PROPANEDIOL 10 MG PO TABS: 10.0000 mg | ORAL_TABLET | Freq: Every day | ORAL | 0 refills | Status: DC

## 2021-02-16 NOTE — Telephone Encounter (Signed)
Patient notified

## 2021-02-16 NOTE — Telephone Encounter (Signed)
Please let patient know that I sent a 30 day supply of Farxiga to the pharmacy for him.  Almyra Free has sent a voucher for the medication to be free.

## 2021-02-17 ENCOUNTER — Telehealth: Payer: Self-pay

## 2021-02-17 NOTE — Telephone Encounter (Signed)
Pt came to bring medication forms left form in bin to be reviwed. Pt stated pharmacy stated he had a 300 dollar copay pt stated he would like to speak with the pharmacist  as he has questions on this.

## 2021-02-17 NOTE — Telephone Encounter (Signed)
Thank you. I will reach out to patient and go over the voucher process.

## 2021-02-27 ENCOUNTER — Other Ambulatory Visit: Payer: Self-pay | Admitting: Family

## 2021-03-09 ENCOUNTER — Other Ambulatory Visit: Payer: Self-pay

## 2021-03-09 ENCOUNTER — Ambulatory Visit (INDEPENDENT_AMBULATORY_CARE_PROVIDER_SITE_OTHER): Payer: Medicare Other | Admitting: Endocrinology

## 2021-03-09 VITALS — BP 150/76 | HR 81 | Ht 72.0 in | Wt 286.0 lb

## 2021-03-09 DIAGNOSIS — E113592 Type 2 diabetes mellitus with proliferative diabetic retinopathy without macular edema, left eye: Secondary | ICD-10-CM | POA: Diagnosis not present

## 2021-03-09 LAB — POCT GLYCOSYLATED HEMOGLOBIN (HGB A1C): Hemoglobin A1C: 12.3 % — AB (ref 4.0–5.6)

## 2021-03-09 MED ORDER — NOVOLIN N FLEXPEN 100 UNIT/ML ~~LOC~~ SUPN: 170.0000 [IU] | PEN_INJECTOR | SUBCUTANEOUS | 3 refills | Status: DC

## 2021-03-09 NOTE — Patient Instructions (Addendum)
Your blood pressure is high today.  Please see your primary care provider soon, to have it rechecked check your blood sugar twice a day.  vary the time of day when you check, between before the 3 meals, and at bedtime.  also check if you have symptoms of your blood sugar being too high or too low.  please keep a record of the readings and bring it to your next appointment here (or you can bring the meter itself).  You can write it on any piece of paper.  please call us sooner if your blood sugar goes below 70, or if most of your readings are over 200.  We will need to take this complex situation in stages.  Please increase the insulin to 170 units each morning.   Please come back for a follow-up appointment in 2 months.

## 2021-03-09 NOTE — Progress Notes (Signed)
Subjective:    Patient ID: Andrew Pruitt, male    DOB: May 17, 1956, 65 y.o.   MRN: [id number]  HPI Pt returns for f/u of diabetes mellitus:  DM type: Insulin-requiring type 2 Dx'ed: 3299 Complications: PN, PDR, and CRI.   Therapy: insulin since 2005, and Farxiga.  DKA: never Severe hypoglycemia: never Pancreatitis: never Pancreatic imaging: never SDOH: he could not afford Farxiga or Rybelsus.  Other: he declines multiple daily injections: Lantus was changed to NPH, due to pattern of cbg's.   Interval history: ins did not pay for Ozempic.  I reviewed continuous glucose monitor data.  Glucose varies from 150-400.  It is in general higher as the day goes on.  Pt says he does not miss the insulin.   Past Medical History:  Diagnosis Date   Arthritis    Depression    Diabetes mellitus without complication ([place])    Hyperlipidemia    Hypertension     Past Surgical History:  Procedure Laterality Date   TONSILLECTOMY      Social History   Socioeconomic History   Marital status: Married    Spouse name: Not on file   Number of children: Not on file   Years of education: Not on file   Highest education level: Not on file  Occupational History   Not on file  Tobacco Use   Smoking status: Never   Smokeless tobacco: Never  Vaping Use   Vaping Use: Never used  Substance and Sexual Activity   Alcohol use: Not Currently   Drug use: Never   Sexual activity: Yes  Other Topics Concern   Not on file  Social History Narrative   Not on file   Social Determinants of Health   Financial Resource Strain: Low Risk    Difficulty of Paying Living Expenses: Not very hard  Food Insecurity: No Food Insecurity   Worried About Running Out of Food in the Last Year: Never true   [place] in the Last Year: Never true  Transportation Needs: No Transportation Needs   Lack of Transportation (Medical): No   Lack of Transportation (Non-Medical): No  Physical Activity: Inactive   Days of  Exercise per Week: 0 days   Minutes of Exercise per Session: 0 min  Stress: No Stress Concern Present   Feeling of Stress : Only a little  Social Connections: Moderately Isolated   Frequency of Communication with Friends and Family: More than three times a week   Frequency of Social Gatherings with Friends and Family: More than three times a week   Attends Religious Services: Never   Marine scientist or Organizations: No   Attends Music therapist: Never   Marital Status: Married  Human resources officer Violence: Not At Risk   Fear of Current or Ex-Partner: No   Emotionally Abused: No   Physically Abused: No   Sexually Abused: No    Current Outpatient Medications on File Prior to Visit  Medication Sig Dispense Refill   amLODipine (NORVASC) 5 MG tablet Take 1 tablet (5 mg total) by mouth daily. Further refills will need to come from new primary care provider 90 tablet 0   atorvastatin (LIPITOR) 80 MG tablet Take 1 tablet (80 mg total) by mouth daily. 90 tablet 3   B-D UF III MINI PEN NEEDLES 31G X 5 MM MISC USE DAILY 100 each 0   Continuous Blood Gluc Receiver (FREESTYLE LIBRE 14 DAY READER) DEVI Use to check glucose levels TID;  E11.9 1 each 0   Continuous Blood Gluc Sensor (FREESTYLE LIBRE 14 DAY SENSOR) MISC Use to check glucose TID. Change every 14 days. E 11.9 6 each 3   dapagliflozin propanediol (FARXIGA) 10 MG TABS tablet Take 1 tablet (10 mg total) by mouth daily before breakfast. 30 tablet 0   diclofenac Sodium (VOLTAREN) 1 % GEL Apply 2 g topically 4 (four) times daily. 100 g 1   fenofibrate (TRICOR) 145 MG tablet Take 1 tablet (145 mg total) by mouth daily. 90 tablet 1   gabapentin (NEURONTIN) 100 MG capsule Take 1 capsule (100 mg total) by mouth 3 (three) times daily. 90 capsule 0   hydrochlorothiazide (HYDRODIURIL) 25 MG tablet TAKE 1 TABLET BY MOUTH EVERY DAY 90 tablet 0   No current facility-administered medications on file prior to visit.    Allergies   Allergen Reactions   Niaspan [Niacin] Other (See Comments)    Red skin, red eyes    Family History  Problem Relation Age of Onset   Cancer Mother        Uterine    Early death Mother    Stroke Mother    Diabetes Father    Heart attack Father    Heart disease Father    Hyperlipidemia Father    Diabetes Brother    Alzheimer's disease Maternal Grandmother    Kidney failure Maternal Grandfather    Diabetes Paternal Grandfather    Diabetes Brother    Diabetes Brother    Colon cancer Neg Hx    Pancreatic cancer Neg Hx    Esophageal cancer Neg Hx     BP (!) 150/76 (BP Location: Right Arm, Patient Position: Sitting, Cuff Size: Large)   Pulse 81   Ht 6' (1.829 m)   Wt 286 lb (129.7 kg)   SpO2 96%   BMI 38.79 kg/m    Review of Systems He denies hypoglycemia.      Objective:   Physical Exam Pulses: dorsalis pedis intact bilat.   MSK: no deformity of the feet CV: 1+ bilat leg edema Skin:  no ulcer on the feet.  normal color and temp on the feet.   Neuro: sensation is intact to touch on the feet, but decreased from normal.     Lab Results  Component Value Date   HGBA1C 12.3 (A) 03/09/2021      Assessment & Plan:  Insulin-requiring type 2 DM: uncontrolled.  Patient Instructions  Your blood pressure is high today.  Please see your primary care provider soon, to have it rechecked check your blood sugar twice a day.  vary the time of day when you check, between before the 3 meals, and at bedtime.  also check if you have symptoms of your blood sugar being too high or too low.  please keep a record of the readings and bring it to your next appointment here (or you can bring the meter itself).  You can write it on any piece of paper.  please call us sooner if your blood sugar goes below 70, or if most of your readings are over 200.  We will need to take this complex situation in stages.  Please increase the insulin to 170 units each morning.   Please come back for a  follow-up appointment in 2 months.

## 2021-03-10 ENCOUNTER — Telehealth: Payer: Self-pay | Admitting: Nurse Practitioner

## 2021-03-10 NOTE — Telephone Encounter (Signed)
Will await fax.

## 2021-03-10 NOTE — Telephone Encounter (Signed)
Pt called and stated that his mail order pharmacy(he wasn't sure which one) would call or fax request for Continuous Blood Gluc Sensor (FREESTYLE LIBRE 14 DAY SENSOR)  and   Continuous Blood Gluc Receiver (FREESTYLE LIBRE 14 DAY READER) / they originally sent request to his old provider/ just a heads up

## 2021-03-15 NOTE — Patient Instructions (Signed)
Visit Information  It was a pleasure speaking with you today! Thank you for letting me be a part of your care team. Please call with any questions or concerns.   Goals Addressed             This Visit's Progress    Lifestyle Change-Hypertension       Timeframe:  Short-Term Goal Priority:  High Start Date:                             Expected End Date:                       Follow Up Date 2 months- agree to work together to make changes - ask questions to understand - learn about high blood pressure    Why is this important?   The changes that you are asked to make may be hard to do.  This is especially true when the changes are life-long.  Knowing why it is important to you is the first step.  Working on the change with your family or support person helps you not feel alone.  Reward yourself and family or support person when goals are met. This can be an activity you choose like bowling, hiking, biking, swimming or shooting hoops.     Notes:       Track and Manage My Blood Pressure-Hypertension       Timeframe:  Long-Range Goal Priority:  High Start Date:                             Expected End Date:                       Follow Up Date 2 months  - check blood pressure daily - write blood pressure results in a log or diary    Why is this important?   You won't feel high blood pressure, but it can still hurt your blood vessels.  High blood pressure can cause heart or kidney problems. It can also cause a stroke.  Making lifestyle changes like losing a little weight or eating less salt will help.  Checking your blood pressure at home and at different times of the day can help to control blood pressure.  If the doctor prescribes medicine remember to take it the way the doctor ordered.  Call the office if you cannot afford the medicine or if there are questions about it.     Notes:          Andrew Pruitt was given information about Chronic Care Management services  today including:  CCM service includes personalized support from designated clinical staff supervised by his physician, including individualized plan of care and coordination with other care providers 24/7 contact phone numbers for assistance for urgent and routine care needs. Standard insurance, coinsurance, copays and deductibles apply for chronic care management only during months in which we provide at least 20 minutes of these services. Most insurances cover these services at 100%, however patients may be responsible for any copay, coinsurance and/or deductible if applicable. This service may help you avoid the need for more expensive face-to-face services. Only one practitioner may furnish and bill the service in a calendar month. The patient may stop CCM services at any time (effective at the end of the month) by phone call to the  office staff.  Patient agreed to services and verbal consent obtained.   Patient verbalizes understanding of instructions provided today and agrees to view in [place].  Telephone follow up appointment with pharmacy team member scheduled for:4-6 weeks CPA  Junita Push. Kenton Kingfisher PharmD, [place] Clinical Pharmacist [phone]

## 2021-03-18 ENCOUNTER — Other Ambulatory Visit: Payer: Self-pay | Admitting: Nurse Practitioner

## 2021-03-18 NOTE — Telephone Encounter (Signed)
Future OV 05/01/21. Approved per protocol.  Requested Prescriptions  Pending Prescriptions Disp Refills  . amLODipine (NORVASC) 5 MG tablet [Pharmacy Med Name: AMLODIPINE BESYLATE 5 MG TAB] 90 tablet 0    Sig: TAKE 1 TABLET BY MOUTH DAILY. FURTHER REFILLS WILL NEED TO COME FROM NEW PRIMARY CARE PROVIDER     Cardiovascular:  Calcium Channel Blockers Failed - 03/18/2021 11:12 AM      Failed - Last BP in normal range    BP Readings from Last 1 Encounters:  03/09/21 (!) 150/76         Passed - Valid encounter within last 6 months    Recent Outpatient Visits          1 month ago Hypertension associated with diabetes ([place])   [facility] Jon Billings, NP   2 months ago Hypertension associated with diabetes [facility])   [facility] Jon Billings, NP      Future Appointments            In 1 month Jon Billings, NP [facility], [place]   In 3 months Ralene Bathe, MD [place]

## 2021-03-22 ENCOUNTER — Telehealth: Payer: Self-pay

## 2021-03-22 NOTE — Telephone Encounter (Signed)
Copied from [place] [phone]. Topic: General - Inquiry >> Mar 22, 2021 10:44 AM Oneta Rack wrote: Patient would like to speak with the practice pharmacy regarding [facility] will not honor coupon given because of Medicare. Insurance will not allow patients to use discount coupon.

## 2021-03-24 NOTE — Telephone Encounter (Signed)
Health Concierge left patient voicemail x2. Pharmacist and Health Concierge coordinated with [facility]. Patient's voucher states that it is 1 per lifetime.

## 2021-03-24 NOTE — Telephone Encounter (Signed)
Thank you. Contacted patient and made aware of medication approval.

## 2021-03-24 NOTE — Telephone Encounter (Signed)
Patient has been unavailable via phon x3.   Patient approved for [place] through [facility] and [place] patient assistance. Medication should arrive to his home in 10-14 days.

## 2021-03-28 ENCOUNTER — Telehealth: Payer: Medicare Other | Admitting: General Practice

## 2021-03-28 ENCOUNTER — Ambulatory Visit (INDEPENDENT_AMBULATORY_CARE_PROVIDER_SITE_OTHER): Payer: Medicare Other | Admitting: General Practice

## 2021-03-28 ENCOUNTER — Other Ambulatory Visit: Payer: Self-pay

## 2021-03-28 DIAGNOSIS — E1122 Type 2 diabetes mellitus with diabetic chronic kidney disease: Secondary | ICD-10-CM | POA: Diagnosis not present

## 2021-03-28 DIAGNOSIS — E1165 Type 2 diabetes mellitus with hyperglycemia: Secondary | ICD-10-CM

## 2021-03-28 DIAGNOSIS — R053 Chronic cough: Secondary | ICD-10-CM

## 2021-03-28 DIAGNOSIS — E785 Hyperlipidemia, unspecified: Secondary | ICD-10-CM

## 2021-03-28 DIAGNOSIS — I152 Hypertension secondary to endocrine disorders: Secondary | ICD-10-CM

## 2021-03-28 DIAGNOSIS — E1159 Type 2 diabetes mellitus with other circulatory complications: Secondary | ICD-10-CM

## 2021-03-28 DIAGNOSIS — E1169 Type 2 diabetes mellitus with other specified complication: Secondary | ICD-10-CM

## 2021-03-28 DIAGNOSIS — IMO0002 Reserved for concepts with insufficient information to code with codable children: Secondary | ICD-10-CM

## 2021-03-28 MED ORDER — DAPAGLIFLOZIN PROPANEDIOL 10 MG PO TABS: 10.0000 mg | ORAL_TABLET | Freq: Every day | ORAL | 1 refills | Status: AC

## 2021-03-28 NOTE — Patient Instructions (Signed)
Visit Information  PATIENT GOALS:  Goals Addressed             This Visit's Progress    RNCM: Monitor and Manage My Blood Sugar-Diabetes Type 2       Timeframe:  Long-Range Goal Priority:  High Start Date: 02-07-2021                            Expected End Date:    02-07-2022                   Follow Up Date 05-30-2021   - check blood sugar at prescribed times - check blood sugar before and after exercise - check blood sugar if I feel it is too high or too low - enter blood sugar readings and medication or insulin into daily log - take the blood sugar log to all doctor visits - take the blood sugar meter to all doctor visits    Why is this important?   Checking your blood sugar at home helps to keep it from getting very high or very low.  Writing the results in a diary or log helps the doctor know how to care for you.  Your blood sugar log should have the time, date and the results.  Also, write down the amount of insulin or other medicine that you take.  Other information, like what you ate, exercise done and how you were feeling, will also be helpful.     Notes: Patient with hemoglobin A1C of 11.8 on 01-24-2021- blood sugar reading range 88 to 350. 03-28-2021: States he is having blood sugars under 200 now. Sometimes down to 80. Denies any extreme lows. Is working on getting his levels down.  Could not get approved for Trulicity because he makes too much money but did get approved for Fargixa. Saw endocrinologist in June and will see again in August.         Patient verbalizes understanding of instructions provided today and agrees to view in [place].   Telephone follow up appointment with care management team member scheduled for: 05-30-2021 at 230 pm  Noreene Larsson RN, MSN, [facility] Mobile: [phone]

## 2021-03-28 NOTE — Chronic Care Management (AMB) (Signed)
Chronic Care Management   CCM RN Visit Note  03/28/2021 Name: Andrew Pruitt MRN: [id number] DOB: November 04, 1955  Subjective: Andrew Pruitt is a 65 y.o. year old male who is a primary care patient of Jon Billings, NP. The care management team was consulted for assistance with disease management and care coordination needs.    Engaged with patient by telephone for follow up visit in response to provider referral for case management and/or care coordination services.   Consent to Services:  The patient was given information about Chronic Care Management services, agreed to services, and gave verbal consent prior to initiation of services.  Please see initial visit note for detailed documentation.   Patient agreed to services and verbal consent obtained.   Assessment: Review of patient past medical history, allergies, medications, health status, including review of consultants reports, laboratory and other test data, was performed as part of comprehensive evaluation and provision of chronic care management services.   SDOH (Social Determinants of Health) assessments and interventions performed:    CCM Care Plan  Allergies  Allergen Reactions   Niaspan [Niacin] Other (See Comments)    Red skin, red eyes    Outpatient Encounter Medications as of 03/28/2021  Medication Sig   [DISCONTINUED] dapagliflozin propanediol (FARXIGA) 10 MG TABS tablet Take 1 tablet (10 mg total) by mouth daily before breakfast.   amLODipine (NORVASC) 5 MG tablet TAKE 1 TABLET BY MOUTH DAILY. FURTHER REFILLS WILL NEED TO COME FROM NEW PRIMARY CARE PROVIDER   atorvastatin (LIPITOR) 80 MG tablet Take 1 tablet (80 mg total) by mouth daily.   B-D UF III MINI PEN NEEDLES 31G X 5 MM MISC USE DAILY   Continuous Blood Gluc Receiver (FREESTYLE LIBRE 14 DAY READER) DEVI Use to check glucose levels TID; E11.9   Continuous Blood Gluc Sensor (FREESTYLE LIBRE 14 DAY SENSOR) MISC Use to check glucose TID. Change every 14 days. E  11.9   diclofenac Sodium (VOLTAREN) 1 % GEL Apply 2 g topically 4 (four) times daily.   fenofibrate (TRICOR) 145 MG tablet Take 1 tablet (145 mg total) by mouth daily.   gabapentin (NEURONTIN) 100 MG capsule Take 1 capsule (100 mg total) by mouth 3 (three) times daily.   hydrochlorothiazide (HYDRODIURIL) 25 MG tablet TAKE 1 TABLET BY MOUTH EVERY DAY   Insulin NPH, Human,, Isophane, (NOVOLIN N FLEXPEN) 100 UNIT/ML Kiwkpen Inject 170 Units into the skin every morning.   No facility-administered encounter medications on file as of 03/28/2021.    Patient Active Problem List   Diagnosis Date Noted   Nuclear sclerotic cataract of both eyes 09/07/2020   H[place]hoe retinal tear, right eye 08/11/2020   Viral warts 06/07/2020   Diabetic macular edema of right eye with proliferative retinopathy associated with type 2 diabetes mellitus ([place]) 04/20/2020   Proliferative diabetic retinopathy of right eye ([place]) 03/02/2020   Type 2 diabetes mellitus with proliferative diabetic retinopathy of left eye without macular edema ([place]) 03/02/2020   Diabetic neuropathy associated with type 2 diabetes mellitus ([place]) 02/22/2020   Uncontrolled type II diabetes mellitus with chronic kidney disease ([place]) 02/22/2020   Hypertension associated with diabetes ([place]) 02/22/2020   Hyperlipidemia associated with type 2 diabetes mellitus ([place]) 02/22/2020   Prostate cancer screening 02/22/2020   Plantar warts 02/22/2020    Conditions to be addressed/monitored:HTN, HLD, DMII, and cough   Care Plan : RNCM: Diabetes Type 2 (Adult)  Updates made by Vanita Ingles since 03/28/2021 12:00 AM     Problem: RNCM: Glycemic  Management (Diabetes, Type 2)   Priority: High     Long-Range Goal: RNCM: Glycemic Management Optimized   Start Date: 02/07/2021  Expected End Date: 03/23/2022  This Visit's Progress: Not on track  Priority: High  Note:   Objective:  Lab Results  Component Value Date   HGBA1C 12.3 (A) 03/09/2021   HGBA1C  01-24-2021 11.8 Lab Results  Component Value Date   CREATININE 1.55 (H) 02/03/2021   CREATININE 1.69 (H) 01/24/2021   CREATININE 1.26 02/11/2020   Lab Results  Component Value Date   EGFR 49 (L) 02/03/2021   Current Barriers:  Knowledge Deficits related to basic Diabetes pathophysiology and self care/management Knowledge Deficits related to medications used for management of diabetes Difficulty obtaining or cannot afford medications Financial Constraints Unable to independently manage DM as evidence of hemoglobin A1C of 11.8 on 01-24-2021. 03-28-2021: Endocrinologist office A1C was 12.3 Unable to self administer medications as prescribed- unable to afford Ozempic. 03-28-2021: Makes too much money to get assistance with Trulicity. Has been approved for Farxiga through [place] and me. Will receive this through the mail.  Does not adhere to prescribed medication regimen Case Manager Clinical Goal(s):  patient will demonstrate improved adherence to prescribed treatment plan for diabetes self care/management as evidenced by: daily monitoring and recording of CBG  adherence to ADA/ carb modified diet exercise 5 days/week adherence to prescribed medication regimen contacting provider for new or worsened symptoms or questions work with pharm D to see if there are programs to help with affording Ozempic- appointment 02-15-2021 Interventions:  Collaboration with Jon Billings, NP regarding development and update of comprehensive plan of care as evidenced by provider attestation and co-signature Inter-disciplinary care team collaboration (see longitudinal plan of care) Provided education to patient about basic DM disease process. Will send information by the EMMI system and My Chart on dietary restrictions for DM and heart healthy diet. As well as exercise and pacing activity  Reviewed medications with patient and discussed importance of medication adherence. The patient can not afford the Ozempic. States the  endocrinologist has upped his insulin up to 150 units a day and he has no energy and can not do exercise. He has gained 40 pounds. His usual weight is 260 and he weights 300 pounds and is 6'0". Appointment with pharm D to help with cost constraints of Ozempic on 02-15-2021. 03-28-2021: Was not approved for help with Ozempic or Trulicity, does for [place] and will receive this in the mail. Education on the importance of taking as prescribed.  Discussed plans with patient for ongoing care management follow up and provided patient with direct contact information for care management team Provided patient with written educational materials related to hypo and hyperglycemia and importance of correct treatment. Review of fasting blood sugars or <130 and post prandial of <180. The patient states his range in am is around 80 to 88 and other times 186 to 200. Education on goal of getting A1C down considerably  Reviewed scheduled/upcoming provider appointments including: 05-01-2021 at 0900 am Advised patient, providing education and rationale, to check cbg tid and record, calling pcp/endocrinologist  for findings outside established parameters.   Referral made to pharmacy team for assistance with cost of Ozempic and the patients cost constraints.  Review of patient status, including review of consultants reports, relevant laboratory and other test results, and medications completed. Self-Care Activities - UNABLE to independently manage DM Self administers oral medications as prescribed Self administers insulin as prescribed Attends all scheduled provider appointments Checks  blood sugars as prescribed and utilize hyper and hypoglycemia protocol as needed Adheres to prescribed ADA/carb modified Patient Goals: - check blood sugar at prescribed times - check blood sugar before and after exercise - check blood sugar if I feel it is too high or too low - enter blood sugar readings and medication or insulin into daily  log - take the blood sugar log to all doctor visits - change to whole grain breads, cereal, pasta - set goal weight - drink 6 to 8 glasses of water each day - eat fish at least once per week - fill half of plate with vegetables - limit fast food meals to no more than 1 per week - manage portion size - prepare main meal at home 3 to 5 days each week - read food labels for fat, fiber, carbohydrates and portion size - set a realistic goal - schedule appointment with eye doctor - check feet daily for cuts, sores or redness - keep feet up while sitting - trim toenails straight across - wash and dry feet carefully every day - wear comfortable, cotton socks - wear comfortable, well-fitting shoes - barriers to adherence to treatment plan identified - blood glucose readings reviewed - individualized medical nutrition therapy provided - mutual A1C goal set or reviewed - resources required to improve adherence to care identified - self-awareness of signs/symptoms of hypo or hyperglycemia encouraged - use of blood glucose monitoring log promoted Follow Up Plan: Telephone follow up appointment with care management team member scheduled for: 05-30-2021 at 230 pm       Task: RNCM: Alleviate Barriers to Glycemic Management Completed 03/28/2021  Outcome: Positive  Note:   Care Management Activities:    - barriers to adherence to treatment plan identified - blood glucose readings reviewed - individualized medical nutrition therapy provided - mutual A1C goal set or reviewed - resources required to improve adherence to care identified - self-awareness of signs/symptoms of hypo or hyperglycemia encouraged - use of blood glucose monitoring log promoted        Care Plan : RNCM: Hypertension (Adult)  Updates made by Vanita Ingles since 03/28/2021 12:00 AM     Problem: RNCM: Hypertension (Hypertension)   Priority: Medium     Long-Range Goal: RNCM: Hypertension Monitored   Start Date:  02/07/2021  Expected End Date: 03/23/2022  This Visit's Progress: Not on track  Priority: High  Note:   Objective:  Last practice recorded BP readings:  BP Readings from Last 3 Encounters:  03/09/21 (!) 150/76  01/24/21 130/82  01/03/21 (!) 150/70    Most recent eGFR/CrCl:  Lab Results  Component Value Date   EGFR 49 (L) 02/03/2021    No components found for: CRCL Current Barriers:  Knowledge Deficits related to basic understanding of hypertension pathophysiology and self care management Knowledge Deficits related to understanding of medications prescribed for management of hypertension Financial Constraints.  Unable to independently manage HTN Does not maintain contact with provider office Does not contact provider office for questions/concerns Case Manager Clinical Goal(s):  patient will verbalize understanding of plan for hypertension management patient will attend all scheduled medical appointments: 05-01-2021 at 0900 am patient will demonstrate improved adherence to prescribed treatment plan for hypertension as evidenced by taking all medications as prescribed, monitoring and recording blood pressure as directed, adhering to low sodium/DASH diet patient will demonstrate improved health management independence as evidenced by checking blood pressure as directed and notifying PCP if SBP>150 or DBP > 90, taking all  medications as prescribe, and adhering to a low sodium diet as discussed. patient will verbalize basic understanding of hypertension disease process and self health management plan as evidenced by compliance with Heart healthy/ADA diet, compliance with medications regiment, and working with the CCM team to effectively manage health and well being.  Interventions:  Collaboration with Jon Billings, NP regarding development and update of comprehensive plan of care as evidenced by provider attestation and co-signature Inter-disciplinary care team collaboration (see  longitudinal plan of care) Evaluation of current treatment plan related to hypertension self management and patient's adherence to plan as established by provider. 03-28-2021: The patients blood pressure was elevated at the endocrinologist office on 03-09-2021. The patient states that his blood pressure fluctuates at times. Denies any headaches. Review of systolic 177 or less and diastolic 90 or less. Education and support given.  Provided education to patient re: stroke prevention, s/s of heart attack and stroke, DASH diet, complications of uncontrolled blood pressure. 03-28-2021: Review of heart healthy/ADA diet and the risk of heart attack and stroke with uncontrolled blood pressures. The patient is complaining of a cough like he had one time when he was on Lisinopril. The patient does not take ACE inhibitors. The patient states this has been going on for about 2 months now and it is aggravating to him and his wife. He said its like a piece of pop corn in stuck in his throat. Reviewed medications for medications with cough side effects, none detected. Will collaborate with the pcp and see what recommendations she has. Discussed allergies, changes in detergent, other factors that may cause a cough. The patient denies any underlying factors.  Reviewed medications with patient and discussed importance of compliance. 03-28-2021: The patient is compliant with medications he can afford. Denies any issues with medications.  Discussed plans with patient for ongoing care management follow up and provided patient with direct contact information for care management team Advised patient, providing education and rationale, to monitor blood pressure daily and record, calling PCP for findings outside established parameters.  Reviewed scheduled/upcoming provider appointments including: 05-01-2021 at 0900 am Self-Care Activities: - Self administers medications as prescribed Attends all scheduled provider appointments Calls  provider office for new concerns, questions, or BP outside discussed parameters Checks BP and records as discussed Follows a low sodium diet/DASH diet Patient Goals: - check blood pressure weekly - choose a place to take my blood pressure (home, clinic or office, retail store) - write blood pressure results in a log or diary - agree on reward when goals are met - agree to work together to make changes - ask questions to understand - learn about high blood pressure - blood pressure trends reviewed - depression screen reviewed - home or ambulatory blood pressure monitoring encouraged Follow Up Plan: Telephone follow up appointment with care management team member scheduled for: 05-30-2021 at 230 pm     Task: RNCM: Identify and Monitor Blood Pressure Elevation Completed 03/28/2021  Outcome: Positive  Note:   Care Management Activities:    - blood pressure trends reviewed - depression screen reviewed - home or ambulatory blood pressure monitoring encouraged      Care Plan : RNCM: HLD Management  Updates made by Vanita Ingles since 03/28/2021 12:00 AM     Problem: RNCM; HLD management   Priority: High     Long-Range Goal: Self-Management Plan Developed   Start Date: 02/07/2021  Expected End Date: 03/23/2022  This Visit's Progress: On track  Priority: High  Note:  Current Barriers:  Poorly controlled hyperlipidemia, complicated by DM, low energy level, insomnia Current antihyperlipidemic regimen: Atorvastatin 78m and Fenofibrate 145 mg daily Most recent lipid panel:     Component Value Date/Time   CHOL 200 (H) 01/24/2021 1030   TRIG 1,075 (HH) 01/24/2021 1030   HDL 21 (L) 01/24/2021 1030   CHOLHDL 9.5 (H) 01/24/2021 1030   CHOLHDL 12 02/11/2020 1037   LDLCALC Comment (A) 01/24/2021 1030   LDLDIRECT 81.0 02/11/2020 1037   ASCVD risk enhancing conditions: age >>82 DM, HTN Unable to independently manage HLD as evidence of abnormal labs Picky eater, does not like  vegetables  RN Care Manager Clinical Goal(s):  patient will work with RConsulting civil engineer providers, and care team towards execution of optimized self-health management plan patient will verbalize understanding of plan for effective management of HLD  patient will [facility] team and pcp  to address needs related to effective management of HLD  patient will attend all scheduled medical appointments: 05-01-2021 at 0900 am patient will demonstrate improved adherence to prescribed treatment plan for HLD as evidenced bypostivie response to medications regimen and lab work trending toward normal limits.  Interventions: Collaboration with HJon Billings NP regarding development and update of comprehensive plan of care as evidenced by provider attestation and co-signature Inter-disciplinary care team collaboration (see longitudinal plan of care) Medication review performed; medication list updated in electronic medical record. The patient has had changes in his medications regimen for HLD. He is taking the atorvastatin 80 mg and fenofibrate 145 mg daily without difficulty. No cost constraints with HLD medications regimen.  Inter-disciplinary care team collaboration (see longitudinal plan of care) Referred to pharmacy team for assistance with HLD medication management- has an appointment on 02-15-2021 with the pharmacist for medications reconciliation and concerns  Evaluation of current treatment plan related to HLD and patient's adherence to plan as established by provider. 03-28-2021: The patient states he is monitoring his dietary intake and has made positive changes. Denies any issues with his cholesterol at this time. Encouraged active lifestyle. Will continue to monitor.  Advised patient to work on dietary changes, call the office for changes or questions.  Provided education to patient re: heart healthy/ADA diet, review of food choices due to the patients picky eating habits. Review of exercise  routine. The patient states he can not exercise longer than 10 minutes due to his energy level and not being able to do any more activity after that.  Reviewed medications with patient and discussed compliance. States compliance with medications for HLD  Reviewed scheduled/upcoming provider appointments including: 05-01-2021 AT 0900 am Discussed plans with patient for ongoing care management follow up and provided patient with direct contact information for care management team Patient Goals/Self-Care Activities: - call for medicine refill 2 or 3 days before it runs out - call if I am sick and can't take my medicine - keep a list of all the medicines I take; vitamins and herbals too - learn to read medicine labels - use a pillbox to sort medicine - use an alarm clock or phone to remind me to take my medicine - change to whole grain breads, cereal, pasta - drink 6 to 8 glasses of water each day - eat 3 to 5 servings of fruits and vegetables each day - eat 5 or 6 small meals each day - eat fish at least once per week - fill half the plate with nonstarchy vegetables - limit fast food meals to no more  than 1 per week - manage portion size - prepare main meal at home 3 to 5 days each week - read food labels for fat, fiber, carbohydrates and portion size - be open to making changes - I can manage, know and watch for signs of a heart attack - if I have chest pain, call for help - learn about small changes that will make a big difference - learn my personal risk factors - barriers to meeting goals identified - change-talk evoked - choices provided - collaboration with team encouraged - decision-making supported - health risks reviewed - problem-solving facilitated - questions answered - readiness for change evaluated - reassurance provided - self-reflection promoted - self-reliance encouraged - verbalization of feelings encouraged  Follow Up Plan: Telephone follow up appointment with  care management team member scheduled for: 05-30-2021 at 230 pm      Task: RNCM: HLD Management Completed 03/28/2021  Outcome: Positive  Note:   Care Management Activities:    - barriers to meeting goals identified - change-talk evoked - choices provided - collaboration with team encouraged - decision-making supported - health risks reviewed - problem-solving facilitated - questions answered - readiness for change evaluated - reassurance provided - self-reflection promoted - self-reliance encouraged - verbalization of feelings encouraged         Plan:Telephone follow up appointment with care management team member scheduled for:  05-30-2021 at 230 pm  [place], MSN, [facility] Mobile: [phone]

## 2021-04-05 ENCOUNTER — Other Ambulatory Visit: Payer: Self-pay

## 2021-04-05 ENCOUNTER — Emergency Department
Admission: EM | Admit: 2021-04-05 | Discharge: 2021-04-05 | Disposition: A | Discharge status: Home / Self Care | Payer: Medicare Other | Attending: Emergency Medicine | Admitting: Emergency Medicine

## 2021-04-05 ENCOUNTER — Encounter: Payer: Self-pay | Admitting: Emergency Medicine

## 2021-04-05 ENCOUNTER — Emergency Department: Payer: Medicare Other

## 2021-04-05 DIAGNOSIS — Z794 Long term (current) use of insulin: Secondary | ICD-10-CM | POA: Insufficient documentation

## 2021-04-05 DIAGNOSIS — Z79899 Other long term (current) drug therapy: Secondary | ICD-10-CM | POA: Diagnosis not present

## 2021-04-05 DIAGNOSIS — E785 Hyperlipidemia, unspecified: Secondary | ICD-10-CM | POA: Insufficient documentation

## 2021-04-05 DIAGNOSIS — R079 Chest pain, unspecified: Secondary | ICD-10-CM | POA: Insufficient documentation

## 2021-04-05 DIAGNOSIS — I129 Hypertensive chronic kidney disease with stage 1 through stage 4 chronic kidney disease, or unspecified chronic kidney disease: Secondary | ICD-10-CM | POA: Diagnosis not present

## 2021-04-05 DIAGNOSIS — N189 Chronic kidney disease, unspecified: Secondary | ICD-10-CM | POA: Diagnosis not present

## 2021-04-05 DIAGNOSIS — E1122 Type 2 diabetes mellitus with diabetic chronic kidney disease: Secondary | ICD-10-CM | POA: Insufficient documentation

## 2021-04-05 DIAGNOSIS — E1140 Type 2 diabetes mellitus with diabetic neuropathy, unspecified: Secondary | ICD-10-CM | POA: Insufficient documentation

## 2021-04-05 DIAGNOSIS — Z7984 Long term (current) use of oral hypoglycemic drugs: Secondary | ICD-10-CM | POA: Diagnosis not present

## 2021-04-05 DIAGNOSIS — E1169 Type 2 diabetes mellitus with other specified complication: Secondary | ICD-10-CM | POA: Diagnosis not present

## 2021-04-05 DIAGNOSIS — E113591 Type 2 diabetes mellitus with proliferative diabetic retinopathy without macular edema, right eye: Secondary | ICD-10-CM | POA: Insufficient documentation

## 2021-04-05 DIAGNOSIS — E1136 Type 2 diabetes mellitus with diabetic cataract: Secondary | ICD-10-CM | POA: Insufficient documentation

## 2021-04-05 LAB — BASIC METABOLIC PANEL
Anion gap: 12 (ref 5–15)
BUN: 25 mg/dL — ABNORMAL HIGH (ref 8–23)
CO2: 23 mmol/L (ref 22–32)
Calcium: 9.1 mg/dL (ref 8.9–10.3)
Chloride: 97 mmol/L — ABNORMAL LOW (ref 98–111)
Creatinine, Ser: 1.68 mg/dL — ABNORMAL HIGH (ref 0.61–1.24)
GFR, Estimated: 45 mL/min — ABNORMAL LOW (ref >60)
Glucose, Bld: 417 mg/dL — ABNORMAL HIGH (ref 70–99)
Potassium: 4 mmol/L (ref 3.5–5.1)
Sodium: 132 mmol/L — ABNORMAL LOW (ref 135–145)

## 2021-04-05 LAB — CBC
HCT: 44.9 % (ref 39.0–52.0)
Hemoglobin: 16.1 g/dL (ref 13.0–17.0)
MCH: 31.4 pg (ref 26.0–34.0)
MCHC: 35.9 g/dL (ref 30.0–36.0)
MCV: 87.5 fL (ref 80.0–100.0)
Platelets: 305 10*3/uL (ref 150–400)
RBC: 5.13 MIL/uL (ref 4.22–5.81)
RDW: 12.9 % (ref 11.5–15.5)
WBC: 5.6 10*3/uL (ref 4.0–10.5)
nRBC: 0 % (ref 0.0–0.2)

## 2021-04-05 LAB — TROPONIN I (HIGH SENSITIVITY)
Troponin I (High Sensitivity): 5 ng/L (ref <18)
Troponin I (High Sensitivity): 5 ng/L (ref <18)

## 2021-04-05 NOTE — ED Notes (Signed)
Pt ambulatory to restroom, NAD noted.  

## 2021-04-05 NOTE — ED Provider Notes (Signed)
[facility] Emergency Department Provider Note  ____________________________________________   Event Date/Time   First MD Initiated Contact with Patient 04/05/21 [phone]     (approximate)  I have reviewed the triage vital signs and the nursing notes.   HISTORY  Chief Complaint Chest Pain    HPI Andrew Pruitt is a 65 y.o. male with medical history as listed below who presents by private vehicle for evaluation of acute onset pain in his chest.  He describes it as a sharp stabbing pain that started in the left side of his chest but then moved or radiated down into his abdomen.  It happened at about 11 and he continued to have persistent pain since that time until they came into the emergency department.  Now the pain has improved substantially and he can feel little little bit but not much.  He denies shortness of breath, nausea, vomiting, and any other abdominal pain.  He denies fever/chills, sore throat, and dysuria.  He and his wife both state that he has had "stress attacks" in the past with some similar pain.  He has diabetes which they described as poorly controlled on insulin but he has never had a heart attack of which they are aware.     Past Medical History:  Diagnosis Date   Arthritis    Depression    Diabetes mellitus without complication ([place])    Hyperlipidemia    Hypertension     Patient Active Problem List   Diagnosis Date Noted   Nuclear sclerotic cataract of both eyes 09/07/2020   [place]hoe retinal tear, right eye 08/11/2020   Viral warts 06/07/2020   Diabetic macular edema of right eye with proliferative retinopathy associated with type 2 diabetes mellitus ([place]) 04/20/2020   Proliferative diabetic retinopathy of right eye ([place]) 03/02/2020   Type 2 diabetes mellitus with proliferative diabetic retinopathy of left eye without macular edema ([place]) 03/02/2020   Diabetic neuropathy associated with type 2 diabetes mellitus ([place]) 02/22/2020    Uncontrolled type II diabetes mellitus with chronic kidney disease ([place]) 02/22/2020   Hypertension associated with diabetes ([place]) 02/22/2020   Hyperlipidemia associated with type 2 diabetes mellitus ([place]) 02/22/2020   Prostate cancer screening 02/22/2020   Plantar warts 02/22/2020    Past Surgical History:  Procedure Laterality Date   TONSILLECTOMY      Prior to Admission medications   Medication Sig Start Date End Date Taking? Authorizing Provider  amLODipine (NORVASC) 5 MG tablet TAKE 1 TABLET BY MOUTH DAILY. FURTHER REFILLS WILL NEED TO COME FROM NEW PRIMARY CARE PROVIDER 03/18/21   Jon Billings, NP  atorvastatin (LIPITOR) 80 MG tablet Take 1 tablet (80 mg total) by mouth daily. 01/25/21   Jon Billings, NP  B-D UF III MINI PEN NEEDLES 31G X 5 MM MISC USE DAILY 07/19/20   Renato Shin, MD  Continuous Blood Gluc Receiver (FREESTYLE LIBRE 14 DAY READER) DEVI Use to check glucose levels TID; E11.9 02/17/20   Brunetta Jeans, PA-C  Continuous Blood Gluc Sensor (FREESTYLE LIBRE 14 DAY SENSOR) MISC Use to check glucose TID. Change every 14 days. E 11.9 02/17/20   Brunetta Jeans, PA-C  dapagliflozin propanediol (FARXIGA) 10 MG TABS tablet Take 1 tablet (10 mg total) by mouth daily before breakfast. 03/28/21   Jon Billings, NP  diclofenac Sodium (VOLTAREN) 1 % GEL Apply 2 g topically 4 (four) times daily. 12/21/20   Jon Billings, NP  fenofibrate (TRICOR) 145 MG tablet Take 1 tablet (145 mg total)  by mouth daily. 01/25/21   Jon Billings, NP  gabapentin (NEURONTIN) 100 MG capsule Take 1 capsule (100 mg total) by mouth 3 (three) times daily. 12/21/20   Jon Billings, NP  hydrochlorothiazide (HYDRODIURIL) 25 MG tablet TAKE 1 TABLET BY MOUTH EVERY DAY 01/09/21   Jon Billings, NP  Insulin NPH, Human,, Isophane, (NOVOLIN N FLEXPEN) 100 UNIT/ML Kiwkpen Inject 170 Units into the skin every morning. 03/09/21   Renato Shin, MD    Allergies Niaspan [niacin]  Family History   Problem Relation Age of Onset   Cancer Mother        Uterine    Early death Mother    Stroke Mother    Diabetes Father    Heart attack Father    Heart disease Father    Hyperlipidemia Father    Diabetes Brother    Alzheimer's disease Maternal Grandmother    Kidney failure Maternal Grandfather    Diabetes Paternal Grandfather    Diabetes Brother    Diabetes Brother    Colon cancer Neg Hx    Pancreatic cancer Neg Hx    Esophageal cancer Neg Hx     Social History Social History   Tobacco Use   Smoking status: Never   Smokeless tobacco: Never  Vaping Use   Vaping Use: Never used  Substance Use Topics   Alcohol use: Not Currently   Drug use: Never    Review of Systems Constitutional: No fever/chills Eyes: No visual changes. ENT: No sore throat. Cardiovascular: Positive for chest pain. Respiratory: Denies shortness of breath. Gastrointestinal: No abdominal pain.  No nausea, no vomiting.  No diarrhea.  No constipation. Genitourinary: Negative for dysuria. Musculoskeletal: Negative for neck pain.  Negative for back pain. Integumentary: Negative for rash. Neurological: Negative for headaches, focal weakness or numbness.   ____________________________________________   PHYSICAL EXAM:  VITAL SIGNS: ED Triage Vitals  Enc Vitals Group     BP 04/05/21 0445 (!) 161/85     Pulse Rate 04/05/21 0445 83     Resp 04/05/21 0445 18     Temp 04/05/21 0445 98.4 F (36.9 C)     Temp Source 04/05/21 0445 Oral     SpO2 04/05/21 0445 97 %     Weight 04/05/21 0441 136.1 kg (300 lb)     Height 04/05/21 0441 1.829 m (6')     Head Circumference --      Peak Flow --      Pain Score 04/05/21 0441 10     Pain Loc --      Pain Edu? --      Excl. in [place]? --     Constitutional: Alert and oriented.  Eyes: Conjunctivae are normal.  Head: Atraumatic. Nose: No congestion/rhinnorhea. Mouth/Throat: Patient is wearing a mask. Neck: No stridor.  No meningeal signs.   Cardiovascular:  Normal rate, regular rhythm. Good peripheral circulation. Respiratory: Normal respiratory effort.  No retractions. Gastrointestinal: Soft and nontender. No distention.  Musculoskeletal: No lower extremity tenderness nor edema. No gross deformities of extremities. Neurologic:  Normal speech and language. No gross focal neurologic deficits are appreciated.  Skin:  Skin is warm, dry and intact. Psychiatric: Mood and affect are normal. Speech and behavior are normal.  ____________________________________________   LABS (all labs ordered are listed, but only abnormal results are displayed)  Labs Reviewed  BASIC METABOLIC PANEL - Abnormal; Notable for the following components:      Result Value   Sodium 132 (*)    Chloride 97 (*)  Glucose, Bld 417 (*)    BUN 25 (*)    Creatinine, Ser 1.68 (*)    GFR, Estimated 45 (*)    All other components within normal limits  CBC  TROPONIN I (HIGH SENSITIVITY)  TROPONIN I (HIGH SENSITIVITY)   ____________________________________________  EKG  ED ECG REPORT I, Hinda Kehr, the attending physician, personally viewed and interpreted this ECG.  Date: 04/05/2021 EKG Time: 4:50 AM Rate: 86 Rhythm: normal sinus rhythm QRS Axis: normal Intervals: normal ST/T Wave abnormalities: normal Narrative Interpretation: no evidence of acute ischemia  ____________________________________________   PROCEDURES   Procedure(s) performed (including Critical Care):  .1-3 Lead EKG Interpretation  Date/Time: 04/05/2021 6:01 AM Performed by: Hinda Kehr, MD Authorized by: Hinda Kehr, MD     Interpretation: normal     ECG rate:  82   ECG rate assessment: normal     Rhythm: sinus rhythm     Ectopy: none     Conduction: normal     ____________________________________________   INITIAL IMPRESSION / MDM / ASSESSMENT AND PLAN / ED COURSE  As part of my medical decision making, I reviewed the following data within the electronic MEDICAL RECORD NUMBER  History obtained from family, Nursing notes reviewed and incorporated, Labs reviewed , EKG interpreted , Old chart reviewed, Radiograph reviewed , and Notes from prior ED visits   Differential diagnosis includes, but is not limited to, musculoskeletal pain, ACS, panic attack, biliary colic, renal/ureteral colic.  The patient is on the cardiac monitor to evaluate for evidence of arrhythmia and/or significant heart rate changes.  Reassuring and nonischemic EKG.  Vital signs are stable and within normal limits except for some hypertension.  No infectious signs or symptoms and chest x-ray ordered in triage is pending.  Basic metabolic panel is notable for chronic kidney disease which is stable, hyperglycemia at 417, pseudohyponatremia, normal CBC, and a high-sensitivity troponin of 5.  No abdominal tenderness to palpation, no indication for hepatic function testing or lipase.  Patient is well-appearing at this time, very talkative and in no distress, pleasant, calm, and cooperative.  I explained the plan to check a repeat high-sensitivity troponin in 2 hours and he and his wife understand and agree with the plan.  They are anticipating outpatient follow-up if his symptoms remain resolved or almost resolved and there is no significant change in his troponin.     Clinical Course as of 04/05/21 0803  Wed Apr 05, 2021  0753 Patient's repeat troponin is unchanged.  He said he is not having chest pain.  He and his wife are comfortable with the plan for discharge and close outpatient follow-up.  I am giving them information for how to contact cardiology to schedule follow-up appointment.  I gave my usual and customary return precautions. [CF]    Clinical Course User Index [CF] Hinda Kehr, MD     ____________________________________________  FINAL CLINICAL IMPRESSION(S) / ED DIAGNOSES  Final diagnoses:  Chest pain, unspecified type     MEDICATIONS GIVEN DURING THIS VISIT:  Medications - No  data to display   ED Discharge Orders     None        Note:  This document was prepared using Dragon voice recognition software and may include unintentional dictation errors.   Hinda Kehr, MD 04/05/21 [phone]

## 2021-04-05 NOTE — ED Notes (Signed)
MD at the bedside  

## 2021-04-05 NOTE — ED Notes (Signed)
Lab at the bedside 

## 2021-04-05 NOTE — ED Triage Notes (Signed)
Pt to triage via w/c with no distress noted; reports rt sided CP accomp by weakness for last 4 hrs

## 2021-04-05 NOTE — Discharge Instructions (Signed)

## 2021-04-06 ENCOUNTER — Other Ambulatory Visit: Payer: Self-pay | Admitting: Nurse Practitioner

## 2021-04-06 NOTE — Telephone Encounter (Signed)
Requested Prescriptions  Pending Prescriptions Disp Refills  . hydrochlorothiazide (HYDRODIURIL) 25 MG tablet [Pharmacy Med Name: HYDROCHLOROTHIAZIDE 25 MG TAB] 90 tablet 0    Sig: TAKE 1 TABLET BY MOUTH EVERY DAY     There is no refill protocol information for this order

## 2021-04-18 ENCOUNTER — Telehealth: Payer: Self-pay

## 2021-04-18 NOTE — Telephone Encounter (Signed)
Copied from [place] [id number]. Topic: General - Other >> Apr 18, 2021 11:50 AM Celene Kras wrote: Reason for CRM: Alandra, from [employer], calling stating that they faxed over precription for the freestyle libre meter on 04/04/21. She states that she still has not received a response. Please advise.

## 2021-04-18 NOTE — Telephone Encounter (Signed)
Tried calling Andrew Pruitt back to have another copy of the fax sent to [place] to complete. LVM asking for Andrew Pruitt to please return my call.

## 2021-04-19 NOTE — Telephone Encounter (Signed)
Form Signed.

## 2021-04-19 NOTE — Telephone Encounter (Signed)
Form placed in folder for signature.

## 2021-05-01 ENCOUNTER — Encounter: Payer: Self-pay | Admitting: Nurse Practitioner

## 2021-05-01 ENCOUNTER — Ambulatory Visit (INDEPENDENT_AMBULATORY_CARE_PROVIDER_SITE_OTHER): Payer: Medicare Other | Admitting: Nurse Practitioner

## 2021-05-01 ENCOUNTER — Other Ambulatory Visit: Payer: Self-pay

## 2021-05-01 VITALS — BP 152/85 | HR 86 | Temp 98.2°F | Ht 70.87 in | Wt 286.0 lb

## 2021-05-01 DIAGNOSIS — I152 Hypertension secondary to endocrine disorders: Secondary | ICD-10-CM

## 2021-05-01 DIAGNOSIS — Z23 Encounter for immunization: Secondary | ICD-10-CM | POA: Diagnosis not present

## 2021-05-01 DIAGNOSIS — IMO0002 Reserved for concepts with insufficient information to code with codable children: Secondary | ICD-10-CM

## 2021-05-01 DIAGNOSIS — E1159 Type 2 diabetes mellitus with other circulatory complications: Secondary | ICD-10-CM

## 2021-05-01 DIAGNOSIS — E1122 Type 2 diabetes mellitus with diabetic chronic kidney disease: Secondary | ICD-10-CM | POA: Diagnosis not present

## 2021-05-01 DIAGNOSIS — R7989 Other specified abnormal findings of blood chemistry: Secondary | ICD-10-CM

## 2021-05-01 DIAGNOSIS — E1169 Type 2 diabetes mellitus with other specified complication: Secondary | ICD-10-CM

## 2021-05-01 DIAGNOSIS — E1165 Type 2 diabetes mellitus with hyperglycemia: Secondary | ICD-10-CM

## 2021-05-01 DIAGNOSIS — E785 Hyperlipidemia, unspecified: Secondary | ICD-10-CM

## 2021-05-01 NOTE — Assessment & Plan Note (Signed)
Chronic. Uncontrolled. Continue to follow up with Endocrinology.

## 2021-05-01 NOTE — Progress Notes (Signed)
BP (!) 152/85   Pulse 86   Temp 98.2 F (36.8 C)   Ht 5' 10.87" (1.8 m)   Wt 286 lb (129.7 kg)   SpO2 96%   BMI 40.04 kg/m    Subjective:    Patient ID: Andrew Pruitt, male    DOB: Jan 19, 1956, 65 y.o.   MRN: [id number]  HPI: Andrew Pruitt is a 65 y.o. male  Chief Complaint  Patient presents with   Hypertension   Hyperlipidemia   Diabetes   HYPERTENSION / [place] Satisfied with current treatment? yes Duration of hypertension: years BP monitoring frequency: not checking BP range:  BP medication side effects: no Past BP meds: amlodipine and HCTZ Duration of hyperlipidemia: years Cholesterol medication side effects: no Cholesterol supplements: none Past cholesterol medications: atorvastain (lipitor) and fenofibrate (tricor) Medication compliance: excellent compliance Aspirin: no Recent stressors: no Recurrent headaches: no Visual changes: no Palpitations: no Dyspnea: no Chest pain: no Lower extremity edema: no Dizzy/lightheaded: no  DIABETES Hypoglycemic episodes:no Polydipsia/polyuria: no Visual disturbance: no Chest pain: no Paresthesias: yes Glucose Monitoring: yes  Accucheck frequency: Daily  Fasting glucose: 130  Post prandial:  Evening:  Before meals: Taking Insulin?: yes  Long acting insulin: 170  Short acting insulin: Blood Pressure Monitoring: not checking Retinal Examination:  Has it scheduled Foot Exam: Up to Date Diabetic Education: Not Completed Pneumovax: Up to Date Influenza: Not up to Date Aspirin: no  Relevant past medical, surgical, family and social history reviewed and updated as indicated. Interim medical history since our last visit reviewed. Allergies and medications reviewed and updated.  Review of Systems  Eyes:  Negative for visual disturbance.  Respiratory:  Negative for chest tightness and shortness of breath.   Cardiovascular:  Negative for chest pain, palpitations and leg swelling.  Endocrine: Negative for  polydipsia and polyuria.  Neurological:  Positive for numbness. Negative for dizziness, light-headedness and headaches.   Per HPI unless specifically indicated above     Objective:    BP (!) 152/85   Pulse 86   Temp 98.2 F (36.8 C)   Ht 5' 10.87" (1.8 m)   Wt 286 lb (129.7 kg)   SpO2 96%   BMI 40.04 kg/m   Wt Readings from Last 3 Encounters:  05/01/21 286 lb (129.7 kg)  04/05/21 300 lb (136.1 kg)  03/09/21 286 lb (129.7 kg)    Physical Exam Vitals and nursing note reviewed.  Constitutional:      General: He is not in acute distress.    Appearance: Normal appearance. He is obese. He is not ill-appearing, toxic-appearing or diaphoretic.  HENT:     Head: Normocephalic.     Right Ear: External ear normal.     Left Ear: External ear normal.     Nose: Nose normal. No congestion or rhinorrhea.     Mouth/Throat:     Mouth: Mucous membranes are moist.  Eyes:     General:        Right eye: No discharge.        Left eye: No discharge.     Extraocular Movements: Extraocular movements intact.     Conjunctiva/sclera: Conjunctivae normal.     Pupils: Pupils are equal, round, and reactive to light.  Cardiovascular:     Rate and Rhythm: Normal rate and regular rhythm.     Heart sounds: No murmur heard. Pulmonary:     Effort: Pulmonary effort is normal. No respiratory distress.     Breath sounds: Normal breath sounds. No wheezing,  rhonchi or rales.  Abdominal:     General: Abdomen is flat. Bowel sounds are normal.  Musculoskeletal:     Cervical back: Normal range of motion and neck supple.  Skin:    General: Skin is warm and dry.     Capillary Refill: Capillary refill takes less than 2 seconds.  Neurological:     General: No focal deficit present.     Mental Status: He is alert and oriented to person, place, and time.  Psychiatric:        Mood and Affect: Mood normal.        Behavior: Behavior normal.        Thought Content: Thought content normal.        Judgment:  Judgment normal.    Results for orders placed or performed during the hospital encounter of 04/05/21  CBC  Result Value Ref Range   WBC 5.6 4.0 - 10.5 K/uL   RBC 5.13 4.22 - 5.81 MIL/uL   Hemoglobin 16.1 13.0 - 17.0 g/dL   HCT 44.9 39.0 - 52.0 %   MCV 87.5 80.0 - 100.0 fL   MCH 31.4 26.0 - 34.0 pg   MCHC 35.9 30.0 - 36.0 g/dL   RDW 12.9 11.5 - 15.5 %   Platelets 305 150 - 400 K/uL   nRBC 0.0 0.0 - 0.2 %  Basic metabolic panel  Result Value Ref Range   Sodium 132 (L) 135 - 145 mmol/L   Potassium 4.0 3.5 - 5.1 mmol/L   Chloride 97 (L) 98 - 111 mmol/L   CO2 23 22 - 32 mmol/L   Glucose, Bld 417 (H) 70 - 99 mg/dL   BUN 25 (H) 8 - 23 mg/dL   Creatinine, Ser 1.68 (H) 0.61 - 1.24 mg/dL   Calcium 9.1 8.9 - 10.3 mg/dL   GFR, Estimated 45 (L) >60 mL/min   Anion gap 12 5 - 15  Troponin I (High Sensitivity)  Result Value Ref Range   Troponin I (High Sensitivity) 5 <18 ng/L  Troponin I (High Sensitivity)  Result Value Ref Range   Troponin I (High Sensitivity) 5 <18 ng/L      Assessment & Plan:   Problem List Items Addressed This Visit       Cardiovascular and Mediastinum   Hypertension associated with diabetes (HCC)    Chronic. Uncontrolled.  Will increase Amlodipine to 31m. Recommend checking blood pressures at home.  Follow up in 3 months for reevaluation.       Relevant Orders   Comp Met (CMET)     Endocrine   Uncontrolled type II diabetes mellitus with chronic kidney disease ([place] [facility]    Chronic. Uncontrolled. Continue to follow up with Endocrinology.       Relevant Orders   HgB A1c   Hyperlipidemia associated with type 2 diabetes mellitus (HCC)    Chronic. Uncontrolled Triglycerides. Recommend decreasing processed foods and refined sugar intake. Recommend continuing weight loss. Will recheck lab work at visit today.       Relevant Orders   Lipid Profile   Other Visit Diagnoses     Immunization due       Relevant Orders   Pneumococcal polysaccharide  vaccine 23-valent greater than or equal to 2yo subcutaneous/IM (Completed)        Follow up plan: Return in about 3 months (around 08/01/2021) for HTN, HLD, DM2 FU.

## 2021-05-01 NOTE — Assessment & Plan Note (Signed)
Chronic. Uncontrolled Triglycerides. Recommend decreasing processed foods and refined sugar intake. Recommend continuing weight loss. Will recheck lab work at visit today.

## 2021-05-01 NOTE — Assessment & Plan Note (Signed)
Chronic. Uncontrolled.  Will increase Amlodipine to '10mg'$ . Recommend checking blood pressures at home.  Follow up in 3 months for reevaluation.

## 2021-05-02 LAB — COMPREHENSIVE METABOLIC PANEL
ALT: 57 IU/L — ABNORMAL HIGH (ref 0–44)
AST: 27 IU/L (ref 0–40)
Albumin/Globulin Ratio: 1.6 (ref 1.2–2.2)
Albumin: 4.4 g/dL (ref 3.8–4.8)
Alkaline Phosphatase: 69 IU/L (ref 44–121)
BUN/Creatinine Ratio: 14 (ref 10–24)
BUN: 25 mg/dL (ref 8–27)
Bilirubin Total: 0.5 mg/dL (ref 0.0–1.2)
CO2: 23 mmol/L (ref 20–29)
Calcium: 10.1 mg/dL (ref 8.6–10.2)
Chloride: 94 mmol/L — ABNORMAL LOW (ref 96–106)
Creatinine, Ser: 1.84 mg/dL — ABNORMAL HIGH (ref 0.76–1.27)
Globulin, Total: 2.8 g/dL (ref 1.5–4.5)
Glucose: 282 mg/dL — ABNORMAL HIGH (ref 65–99)
Potassium: 3.8 mmol/L (ref 3.5–5.2)
Sodium: 135 mmol/L (ref 134–144)
Total Protein: 7.2 g/dL (ref 6.0–8.5)
eGFR: 40 mL/min/{1.73_m2} — ABNORMAL LOW (ref >59)

## 2021-05-02 LAB — HEMOGLOBIN A1C
Est. average glucose Bld gHb Est-mCnc: 292 mg/dL
Hgb A1c MFr Bld: 11.8 % — ABNORMAL HIGH (ref 4.8–5.6)

## 2021-05-02 LAB — LIPID PANEL
Chol/HDL Ratio: 9.2 ratio — ABNORMAL HIGH (ref 0.0–5.0)
Cholesterol, Total: 220 mg/dL — ABNORMAL HIGH (ref 100–199)
HDL: 24 mg/dL — ABNORMAL LOW (ref >39)
LDL Chol Calc (NIH): 91 mg/dL (ref 0–99)
Triglycerides: 633 mg/dL (ref 0–149)
VLDL Cholesterol Cal: 105 mg/dL — ABNORMAL HIGH (ref 5–40)

## 2021-05-02 NOTE — Addendum Note (Signed)
Addended by: Jon Billings on: 05/02/2021 08:03 PM   Modules accepted: Orders

## 2021-05-02 NOTE — Progress Notes (Signed)
Please let patient know that his kidney function declined some.  This could be due to the uncontrolled diabetes.  I recommend he see nephrology due to the worsening kidney function over the last 3 months.  I have placed the referral.  Patient's A1c has improved form 12.3 to 11.8 which is great and likely due to his weight loss. Keep up the good work.  Cholesterol has also improved some.  Keep up the good work.  We will see you at his next visit.

## 2021-05-03 ENCOUNTER — Ambulatory Visit: Payer: Self-pay | Admitting: *Deleted

## 2021-05-03 NOTE — Telephone Encounter (Signed)
Pt called in for his lab results however according to his chart there was an error and so they called him back.   I wasn't able to decifer which note was the correct note and which one was the error so I had the agent transfer the call into the practice for them to clarify and give him the correct messsage.

## 2021-05-23 ENCOUNTER — Encounter (INDEPENDENT_AMBULATORY_CARE_PROVIDER_SITE_OTHER): Payer: Medicare Other | Admitting: Ophthalmology

## 2021-05-26 ENCOUNTER — Ambulatory Visit (INDEPENDENT_AMBULATORY_CARE_PROVIDER_SITE_OTHER): Payer: Medicare Other | Admitting: Endocrinology

## 2021-05-26 ENCOUNTER — Encounter: Payer: Self-pay | Admitting: Endocrinology

## 2021-05-26 ENCOUNTER — Other Ambulatory Visit: Payer: Self-pay | Admitting: Endocrinology

## 2021-05-26 ENCOUNTER — Other Ambulatory Visit: Payer: Self-pay

## 2021-05-26 VITALS — BP 140/60 | HR 85 | Ht 70.0 in | Wt 292.4 lb

## 2021-05-26 DIAGNOSIS — E113592 Type 2 diabetes mellitus with proliferative diabetic retinopathy without macular edema, left eye: Secondary | ICD-10-CM | POA: Diagnosis not present

## 2021-05-26 LAB — POCT GLYCOSYLATED HEMOGLOBIN (HGB A1C): Hemoglobin A1C: 11.6 % — AB (ref 4.0–5.6)

## 2021-05-26 MED ORDER — INSULIN NPH (HUMAN) (ISOPHANE) 100 UNIT/ML ~~LOC~~ SUSP: 200.0000 [IU] | Freq: Every morning | SUBCUTANEOUS | 3 refills | Status: DC

## 2021-05-26 NOTE — Patient Instructions (Addendum)
check your blood sugar twice a day.  vary the time of day when you check, between before the 3 meals, and at bedtime.  also check if you have symptoms of your blood sugar being too high or too low.  please keep a record of the readings and bring it to your next appointment here (or you can bring the meter itself).  You can write it on any piece of paper.  please call us sooner if your blood sugar goes below 70, or if most of your readings are over 200.  Please increase the insulin to 200 units each morning.   Please come back for a follow-up appointment in 2 months.

## 2021-05-26 NOTE — Telephone Encounter (Signed)
Alternative Requested:NON FORMULARY.

## 2021-05-26 NOTE — Progress Notes (Signed)
Subjective:    Patient ID: Andrew Pruitt, male    DOB: [birth year], 64 y.o.   MRN: [id number]  HPI Pt returns for f/u of diabetes mellitus:  DM type: Insulin-requiring type 2 Dx'ed: 99991111 Complications: PN, PDR, and CRI.   Therapy: insulin since 2005, and Farxiga.  DKA: never Severe hypoglycemia: never Pancreatitis: never Pancreatic imaging: never SDOH: he could not afford Farxiga, Ozempic, or Rybelsus.  Other: he declines multiple daily injections: Lantus was changed to NPH, due to pattern of cbg's; he eats meals at 7AM, 12PM, and 8PM  Interval history: I reviewed continuous glucose monitor data.  Glucose varies from 110-400.  It increases 12N-12MN, and then decreases back to 12N  Pt says he does not miss the insulin.  Past Medical History:  Diagnosis Date   Arthritis    Depression    Diabetes mellitus without complication ([place])    Hyperlipidemia    Hypertension     Past Surgical History:  Procedure Laterality Date   TONSILLECTOMY      Social History   Socioeconomic History   Marital status: Married    Spouse name: Not on file   Number of children: Not on file   Years of education: Not on file   Highest education level: Not on file  Occupational History   Not on file  Tobacco Use   Smoking status: Never   Smokeless tobacco: Never  Vaping Use   Vaping Use: Never used  Substance and Sexual Activity   Alcohol use: Not Currently   Drug use: Never   Sexual activity: Yes  Other Topics Concern   Not on file  Social History Narrative   Not on file   Social Determinants of Health   Financial Resource Strain: Low Risk    Difficulty of Paying Living Expenses: Not very hard  Food Insecurity: No Food Insecurity   Worried About Running Out of Food in the Last Year: Never true   [place] in the Last Year: Never true  Transportation Needs: No Transportation Needs   Lack of Transportation (Medical): No   Lack of Transportation (Non-Medical): No  Physical  Activity: Inactive   Days of Exercise per Week: 0 days   Minutes of Exercise per Session: 0 min  Stress: No Stress Concern Present   Feeling of Stress : Only a little  Social Connections: Moderately Isolated   Frequency of Communication with Friends and Family: More than three times a week   Frequency of Social Gatherings with Friends and Family: More than three times a week   Attends Religious Services: Never   Marine scientist or Organizations: No   Attends Music therapist: Never   Marital Status: Married  Human resources officer Violence: Not At Risk   Fear of Current or Ex-Partner: No   Emotionally Abused: No   Physically Abused: No   Sexually Abused: No    Current Outpatient Medications on File Prior to Visit  Medication Sig Dispense Refill   amLODipine (NORVASC) 5 MG tablet TAKE 1 TABLET BY MOUTH DAILY. FURTHER REFILLS WILL NEED TO COME FROM NEW PRIMARY CARE PROVIDER 90 tablet 0   atorvastatin (LIPITOR) 80 MG tablet Take 1 tablet (80 mg total) by mouth daily. 90 tablet 3   B-D UF III MINI PEN NEEDLES 31G X 5 MM MISC USE DAILY 100 each 0   Continuous Blood Gluc Receiver (FREESTYLE LIBRE 14 DAY READER) DEVI Use to check glucose levels TID; E11.9 1 each 0  Continuous Blood Gluc Sensor (FREESTYLE LIBRE 14 DAY SENSOR) MISC Use to check glucose TID. Change every 14 days. E 11.9 6 each 3   dapagliflozin propanediol (FARXIGA) 10 MG TABS tablet Take 1 tablet (10 mg total) by mouth daily before breakfast. 90 tablet 1   diclofenac Sodium (VOLTAREN) 1 % GEL Apply 2 g topically 4 (four) times daily. 100 g 1   fenofibrate (TRICOR) 145 MG tablet Take 1 tablet (145 mg total) by mouth daily. 90 tablet 1   gabapentin (NEURONTIN) 100 MG capsule Take 1 capsule (100 mg total) by mouth 3 (three) times daily. 90 capsule 0   hydrochlorothiazide (HYDRODIURIL) 25 MG tablet TAKE 1 TABLET BY MOUTH EVERY DAY 90 tablet 0   No current facility-administered medications on file prior to visit.     Allergies  Allergen Reactions   Niaspan [Niacin] Other (See Comments)    Red skin, red eyes    Family History  Problem Relation Age of Onset   Cancer Mother        Uterine    Early death Mother    Stroke Mother    Diabetes Father    Heart attack Father    Heart disease Father    Hyperlipidemia Father    Diabetes Brother    Alzheimer's disease Maternal Grandmother    Kidney failure Maternal Grandfather    Diabetes Paternal Grandfather    Diabetes Brother    Diabetes Brother    Colon cancer Neg Hx    Pancreatic cancer Neg Hx    Esophageal cancer Neg Hx     BP 140/60 (BP Location: Right Arm, Patient Position: Sitting, Cuff Size: Large)   Pulse 85   Ht '5\' 10"'$  (1.778 m)   Wt 292 lb 6.4 oz (132.6 kg)   SpO2 94%   BMI 41.96 kg/m    Review of Systems She denies hypoglycemia.      Objective:   Physical Exam VITAL SIGNS:  See vs page GENERAL: no distress Pulses: dorsalis pedis intact bilat.   MSK: no deformity of the feet CV: trace bilat leg edema Skin:  no ulcer on the feet.  normal color and temp on the feet. Neuro: sensation is intact to touch on the feet.     Lab Results  Component Value Date   HGBA1C 11.8 (H) 05/01/2021      Assessment & Plan:  Insulin-requiring type 2 DM: uncontrolled.    Patient Instructions  check your blood sugar twice a day.  vary the time of day when you check, between before the 3 meals, and at bedtime.  also check if you have symptoms of your blood sugar being too high or too low.  please keep a record of the readings and bring it to your next appointment here (or you can bring the meter itself).  You can write it on any piece of paper.  please call us sooner if your blood sugar goes below 70, or if most of your readings are over 200.  Please increase the insulin to 200 units each morning.   Please come back for a follow-up appointment in 2 months.

## 2021-05-30 ENCOUNTER — Telehealth: Payer: Medicare Other | Admitting: General Practice

## 2021-05-30 ENCOUNTER — Ambulatory Visit (INDEPENDENT_AMBULATORY_CARE_PROVIDER_SITE_OTHER): Payer: Medicare Other

## 2021-05-30 DIAGNOSIS — R053 Chronic cough: Secondary | ICD-10-CM

## 2021-05-30 DIAGNOSIS — I152 Hypertension secondary to endocrine disorders: Secondary | ICD-10-CM

## 2021-05-30 DIAGNOSIS — E1169 Type 2 diabetes mellitus with other specified complication: Secondary | ICD-10-CM

## 2021-05-30 DIAGNOSIS — IMO0002 Reserved for concepts with insufficient information to code with codable children: Secondary | ICD-10-CM

## 2021-05-30 DIAGNOSIS — E1122 Type 2 diabetes mellitus with diabetic chronic kidney disease: Secondary | ICD-10-CM

## 2021-05-30 DIAGNOSIS — E785 Hyperlipidemia, unspecified: Secondary | ICD-10-CM

## 2021-05-30 NOTE — Patient Instructions (Signed)
Visit Information  PATIENT GOALS:  Goals Addressed             This Visit's Progress    RNCM: Monitor and Manage My Blood Sugar-Diabetes Type 2       Timeframe:  Long-Range Goal Priority:  High Start Date: 02-07-2021                            Expected End Date:    02-07-2022                   Follow Up Date 08-01-2021   - check blood sugar at prescribed times - check blood sugar before and after exercise - check blood sugar if I feel it is too high or too low - enter blood sugar readings and medication or insulin into daily log - take the blood sugar log to all doctor visits - take the blood sugar meter to all doctor visits    Why is this important?   Checking your blood sugar at home helps to keep it from getting very high or very low.  Writing the results in a diary or log helps the doctor know how to care for you.  Your blood sugar log should have the time, date and the results.  Also, write down the amount of insulin or other medicine that you take.  Other information, like what you ate, exercise done and how you were feeling, will also be helpful.     Lab Results  Component Value Date   HGBA1C 11.6 (A) 05/26/2021     Notes: Patient with hemoglobin A1C of 11.8 on 01-24-2021- blood sugar reading range 88 to 350. 03-28-2021: States he is having blood sugars under 200 now. Sometimes down to 80. Denies any extreme lows. Is working on getting his levels down.  Could not get approved for Trulicity because he makes too much money but did get approved for Fargixa. Saw endocrinologist in June and will see again in August. 05-30-2021: The patient saw the endocrinologist on 05-26-2021 and A1C was 11.6. The patient states that they have raised his insulin to 200 units every am. Discussed DM management and health. He will follow up with the endocrinologist in 2 months.         Patient verbalizes understanding of instructions provided today and agrees to view in [place].   Face to Face  appointment with care management team member scheduled for: 08-01-2021 at 0900 am  Noreene Larsson RN, MSN, [facility] Mobile: [phone]

## 2021-05-30 NOTE — Chronic Care Management (AMB) (Signed)
Chronic Care Management   CCM RN Visit Note  05/30/2021 Name: Andrew Pruitt MRN: [id number] DOB: 01-07-1956  Subjective: Andrew Pruitt is a 65 y.o. year old male who is a primary care patient of Jon Billings, NP. The care management team was consulted for assistance with disease management and care coordination needs.    Engaged with patient by telephone for follow up visit in response to provider referral for case management and/or care coordination services.   Consent to Services:  The patient was given information about Chronic Care Management services, agreed to services, and gave verbal consent prior to initiation of services.  Please see initial visit note for detailed documentation.   Patient agreed to services and verbal consent obtained.   Assessment: Review of patient past medical history, allergies, medications, health status, including review of consultants reports, laboratory and other test data, was performed as part of comprehensive evaluation and provision of chronic care management services.   SDOH (Social Determinants of Health) assessments and interventions performed:    CCM Care Plan  Allergies  Allergen Reactions   Niaspan [Niacin] Other (See Comments)    Red skin, red eyes    Outpatient Encounter Medications as of 05/30/2021  Medication Sig   amLODipine (NORVASC) 5 MG tablet TAKE 1 TABLET BY MOUTH DAILY. FURTHER REFILLS WILL NEED TO COME FROM NEW PRIMARY CARE PROVIDER   atorvastatin (LIPITOR) 80 MG tablet Take 1 tablet (80 mg total) by mouth daily.   B-D UF III MINI PEN NEEDLES 31G X 5 MM MISC USE DAILY   Continuous Blood Gluc Receiver (FREESTYLE LIBRE 14 DAY READER) DEVI Use to check glucose levels TID; E11.9   Continuous Blood Gluc Sensor (FREESTYLE LIBRE 14 DAY SENSOR) MISC Use to check glucose TID. Change every 14 days. E 11.9   dapagliflozin propanediol (FARXIGA) 10 MG TABS tablet Take 1 tablet (10 mg total) by mouth daily before breakfast.    diclofenac Sodium (VOLTAREN) 1 % GEL Apply 2 g topically 4 (four) times daily.   fenofibrate (TRICOR) 145 MG tablet Take 1 tablet (145 mg total) by mouth daily.   gabapentin (NEURONTIN) 100 MG capsule Take 1 capsule (100 mg total) by mouth 3 (three) times daily.   hydrochlorothiazide (HYDRODIURIL) 25 MG tablet TAKE 1 TABLET BY MOUTH EVERY DAY   insulin NPH Human (HUMULIN N) 100 UNIT/ML injection Inject 2 mLs (200 Units total) into the skin in the morning.   No facility-administered encounter medications on file as of 05/30/2021.    Patient Active Problem List   Diagnosis Date Noted   Nuclear sclerotic cataract of both eyes 09/07/2020   [place]hoe retinal tear, right eye 08/11/2020   Viral warts 06/07/2020   Diabetic macular edema of right eye with proliferative retinopathy associated with type 2 diabetes mellitus ([place]) 04/20/2020   Proliferative diabetic retinopathy of right eye ([place]) 03/02/2020   Type 2 diabetes mellitus with proliferative diabetic retinopathy of left eye without macular edema ([place]) 03/02/2020   Diabetic neuropathy associated with type 2 diabetes mellitus ([place]) 02/22/2020   Uncontrolled type II diabetes mellitus with chronic kidney disease ([place]) 02/22/2020   Hypertension associated with diabetes ([place]) 02/22/2020   Hyperlipidemia associated with type 2 diabetes mellitus ([place]) 02/22/2020   Prostate cancer screening 02/22/2020   Plantar warts 02/22/2020    Conditions to be addressed/monitored:HTN, HLD, DMII, and chronic cough  Care Plan : RNCM: Diabetes Type 2 (Adult)  Updates made by Vanita Ingles, RN since 05/30/2021 12:00 AM     Problem:  RNCM: Glycemic Management (Diabetes, Type 2)   Priority: High     Long-Range Goal: RNCM: Glycemic Management Optimized   Start Date: 02/07/2021  Expected End Date: 03/23/2022  This Visit's Progress: Not on track  Recent Progress: Not on track  Priority: High  Note:   Objective:  Lab Results  Component Value Date   HGBA1C  11.6 (A) 05/26/2021   HGBA1C 01-24-2021 11.8 Lab Results  Component Value Date   CREATININE 1.84 (H) 05/01/2021   CREATININE 1.68 (H) 04/05/2021   CREATININE 1.55 (H) 02/03/2021   Lab Results  Component Value Date   EGFR 40 (L) 05/01/2021   Current Barriers:  Knowledge Deficits related to basic Diabetes pathophysiology and self care/management Knowledge Deficits related to medications used for management of diabetes Difficulty obtaining or cannot afford medications Financial Constraints Unable to independently manage DM as evidence of hemoglobin A1C of 11.8 on 01-24-2021. 03-28-2021: Endocrinologist office A1C was 12.3, 05-26-2021 A1C of 11.6 Unable to self administer medications as prescribed- unable to afford Ozempic. 03-28-2021: Makes too much money to get assistance with Trulicity. Has been approved for Farxiga through [place] and me. Will receive this through the mail.  Does not adhere to prescribed medication regimen Case Manager Clinical Goal(s):  patient will demonstrate improved adherence to prescribed treatment plan for diabetes self care/management as evidenced by: daily monitoring and recording of CBG  adherence to ADA/ carb modified diet exercise 5 days/week adherence to prescribed medication regimen contacting provider for new or worsened symptoms or questions work with pharm D to see if there are programs to help with affording Ozempic- appointment 02-15-2021 Interventions:  Collaboration with Jon Billings, NP regarding development and update of comprehensive plan of care as evidenced by provider attestation and co-signature Inter-disciplinary care team collaboration (see longitudinal plan of care) Provided education to patient about basic DM disease process. Will send information by the EMMI system and My Chart on dietary restrictions for DM and heart healthy diet. As well as exercise and pacing activity  Reviewed medications with patient and discussed importance of medication  adherence. The patient can not afford the Ozempic. States the endocrinologist has upped his insulin up to 150 units a day and he has no energy and can not do exercise. He has gained 40 pounds. His usual weight is 260 and he weights 300 pounds and is 6'0". Appointment with pharm D to help with cost constraints of Ozempic on 02-15-2021. 03-28-2021: Was not approved for help with Ozempic or Trulicity, does for [place] and will receive this in the mail. Education on the importance of taking as prescribed. 05-30-2021: The patient states the endocrinologist raised his insulin to 200 units every morning. No other changes in medications at this time. Will see the endocrinologist in 2 months for follow up.  Patient states compliance with medications at this time.  Discussed plans with patient for ongoing care management follow up and provided patient with direct contact information for care management team Provided patient with written educational materials related to hypo and hyperglycemia and importance of correct treatment. Review of fasting blood sugars or <130 and post prandial of <180. The patient states his range in am is around 80 to 88 and other times 186 to 200. Education on goal of getting A1C down considerably. 05-30-2021:The patient states that he knows he needs to get his blood sugars down. Had lost weight but has gained most of it back. Will continue to monitor.  Reviewed scheduled/upcoming provider appointments including: 08-01-2021 at 0900 am  Advised patient, providing education and rationale, to check cbg tid and record, calling pcp/endocrinologist  for findings outside established parameters.   Referral made to pharmacy team for assistance with cost of Ozempic and the patients cost constraints.  Review of patient status, including review of consultants reports, relevant laboratory and other test results, and medications completed. Self-Care Activities - UNABLE to independently manage DM Self  administers oral medications as prescribed Self administers insulin as prescribed Attends all scheduled provider appointments Checks blood sugars as prescribed and utilize hyper and hypoglycemia protocol as needed Adheres to prescribed ADA/carb modified Patient Goals: - check blood sugar at prescribed times - check blood sugar before and after exercise - check blood sugar if I feel it is too high or too low - enter blood sugar readings and medication or insulin into daily log - take the blood sugar log to all doctor visits - change to whole grain breads, cereal, pasta - set goal weight - drink 6 to 8 glasses of water each day - eat fish at least once per week - fill half of plate with vegetables - limit fast food meals to no more than 1 per week - manage portion size - prepare main meal at home 3 to 5 days each week - read food labels for fat, fiber, carbohydrates and portion size - set a realistic goal - schedule appointment with eye doctor - check feet daily for cuts, sores or redness - keep feet up while sitting - trim toenails straight across - wash and dry feet carefully every day - wear comfortable, cotton socks - wear comfortable, well-fitting shoes - barriers to adherence to treatment plan identified - blood glucose readings reviewed - individualized medical nutrition therapy provided - mutual A1C goal set or reviewed - resources required to improve adherence to care identified - self-awareness of signs/symptoms of hypo or hyperglycemia encouraged - use of blood glucose monitoring log promoted Follow Up Plan: Face to Face appointment with care management team member scheduled for:  08-01-2021 at 0900 am when the patient sees the pcp in the office. Will continue to monitor.       Care Plan : RNCM: Hypertension (Adult)  Updates made by Vanita Ingles, RN since 05/30/2021 12:00 AM     Problem: RNCM: Hypertension (Hypertension)   Priority: Medium     Long-Range  Goal: RNCM: Hypertension Monitored   Start Date: 02/07/2021  Expected End Date: 03/23/2022  This Visit's Progress: On track  Recent Progress: Not on track  Priority: High  Note:   Objective:  Last practice recorded BP readings:  BP Readings from Last 3 Encounters:  05/26/21 140/60  05/01/21 (!) 152/85  04/05/21 (!) 153/93    Most recent eGFR/CrCl:  Lab Results  Component Value Date   EGFR 40 (L) 05/01/2021    No components found for: CRCL Current Barriers:  Knowledge Deficits related to basic understanding of hypertension pathophysiology and self care management Knowledge Deficits related to understanding of medications prescribed for management of hypertension Financial Constraints.  Unable to independently manage HTN Does not maintain contact with provider office Does not contact provider office for questions/concerns Case Manager Clinical Goal(s):  patient will verbalize understanding of plan for hypertension management patient will attend all scheduled medical appointments: 05-01-2021 at 0900 am patient will demonstrate improved adherence to prescribed treatment plan for hypertension as evidenced by taking all medications as prescribed, monitoring and recording blood pressure as directed, adhering to low sodium/DASH diet patient will demonstrate improved health  management independence as evidenced by checking blood pressure as directed and notifying PCP if SBP>150 or DBP > 90, taking all medications as prescribe, and adhering to a low sodium diet as discussed. patient will verbalize basic understanding of hypertension disease process and self health management plan as evidenced by compliance with Heart healthy/ADA diet, compliance with medications regiment, and working with the CCM team to effectively manage health and well being.  Interventions:  Collaboration with Jon Billings, NP regarding development and update of comprehensive plan of care as evidenced by provider  attestation and co-signature Inter-disciplinary care team collaboration (see longitudinal plan of care) Evaluation of current treatment plan related to hypertension self management and patient's adherence to plan as established by provider. 03-28-2021: The patients blood pressure was elevated at the endocrinologist office on 03-09-2021. The patient states that his blood pressure fluctuates at times. Denies any headaches. Review of systolic 262 or less and diastolic 90 or less. Education and support given. 05-30-2021: The patient is having better blood pressure readings since changes in amlodipine dose. The patient denies any issues with HTN. The patient states he is still having a cough and his wife states it is more like a "bark" that he is having that starts when he is eating. He states it is worse than usual. States that he does take the benadryl and that is helpful but it causes him to want to sleep all day. The patient states that he just would like to know what is causing him to have this cough. Review of medications, allergens and other things that may contribute to a cough. Denies any acute distress. Will continue to monitor for changes.  Provided education to patient re: stroke prevention, s/s of heart attack and stroke, DASH diet, complications of uncontrolled blood pressure. 03-28-2021: Review of heart healthy/ADA diet and the risk of heart attack and stroke with uncontrolled blood pressures. The patient is complaining of a cough like he had one time when he was on Lisinopril. The patient does not take ACE inhibitors. The patient states this has been going on for about 2 months now and it is aggravating to him and his wife. He said its like a piece of pop corn in stuck in his throat. Reviewed medications for medications with cough side effects, none detected. Will collaborate with the pcp and see what recommendations she has. Discussed allergies, changes in detergent, other factors that may cause a cough.  The patient denies any underlying factors. 05-30-2021: Continues to have the cough and states it is aggravating. Has discussed with pcp. Will collaborate with the pcp for recommendations. Will continue to monitor.  Reviewed medications with patient and discussed importance of compliance. 05-30-2021: The patient is compliant with medications he can afford. Denies any issues with medications.  Discussed plans with patient for ongoing care management follow up and provided patient with direct contact information for care management team Advised patient, providing education and rationale, to monitor blood pressure daily and record, calling PCP for findings outside established parameters.  Reviewed scheduled/upcoming provider appointments including: 08-01-2021 at 0900 am Self-Care Activities: - Self administers medications as prescribed Attends all scheduled provider appointments Calls provider office for new concerns, questions, or BP outside discussed parameters Checks BP and records as discussed Follows a low sodium diet/DASH diet Patient Goals: - check blood pressure weekly - choose a place to take my blood pressure (home, clinic or office, retail store) - write blood pressure results in a log or diary - agree on reward  when goals are met - agree to work together to make changes - ask questions to understand - learn about high blood pressure - blood pressure trends reviewed - depression screen reviewed - home or ambulatory blood pressure monitoring encouraged Follow Up Plan: Face to Face appointment with care management team member scheduled for:  08-01-2021 at 0900 am, the patient in the office to see the pcp for follow up.     Care Plan : RNCM: HLD Management  Updates made by Vanita Ingles, RN since 05/30/2021 12:00 AM     Problem: RNCM; HLD management   Priority: High     Long-Range Goal: Self-Management Plan Developed   Start Date: 02/07/2021  Expected End Date: 03/23/2022  This  Visit's Progress: Not on track  Recent Progress: On track  Priority: High  Note:   Current Barriers:  Poorly controlled hyperlipidemia, complicated by DM, low energy level, insomnia Current antihyperlipidemic regimen: Atorvastatin 62m and Fenofibrate 145 mg daily Most recent lipid panel:     Component Value Date/Time   CHOL 220 (H) 05/01/2021 0930   TRIG 633 (HH) 05/01/2021 0930   HDL 24 (L) 05/01/2021 0930   CHOLHDL 9.2 (H) 05/01/2021 0930   CHOLHDL 12 02/11/2020 1037   LDLCALC 91 05/01/2021 0930   LDLDIRECT 81.0 02/11/2020 1037   ASCVD risk enhancing conditions: age >>29 DM, HTN Unable to independently manage HLD as evidence of abnormal labs Picky eater, does not like vegetables  RN Care Manager Clinical Goal(s):  patient will work with RConsulting civil engineer providers, and care team towards execution of optimized self-health management plan patient will verbalize understanding of plan for effective management of HLD  [facility] and p[facility]  to address needs related to effective management of HLD  patient will attend all scheduled medical appointments: 05-01-2021 at 0900 am patient will demonstrate improved adherence to prescribed treatment plan for HLD as evidenced bypostivie response to medications regimen and lab work trending toward normal limits.  Interventions: Collaboration with HJon Billings NP regarding development and update of comprehensive plan of care as evidenced by provider attestation and co-signature Inter-disciplinary care team collaboration (see longitudinal plan of care) Medication review performed; medication list updated in electronic medical record. The patient has had changes in his medications regimen for HLD. He is taking the atorvastatin 80 mg and fenofibrate 145 mg daily without difficulty. No cost constraints with HLD medications regimen. 05-30-2021: The patient with high levels. States compliance with medications.  Inter-disciplinary  care team collaboration (see longitudinal plan of care) Referred to pharmacy team for assistance with HLD medication management- has an appointment on 02-15-2021 with the pharmacist for medications reconciliation and concerns  Evaluation of current treatment plan related to HLD and patient's adherence to plan as established by provider. 05-30-2021: The patient states he is monitoring his dietary intake and has made positive changes. Denies any issues with his cholesterol at this time. Encouraged active lifestyle. Will continue to monitor.  Advised patient to work on dietary changes, call the office for changes or questions.  Provided education to patient re: heart healthy/ADA diet, review of food choices due to the patients picky eating habits. Review of exercise routine. The patient states he can not exercise longer than 10 minutes due to his energy level and not being able to do any more activity after that. 05-30-2021: Review of heart healthy/ADA diet Reviewed medications with patient and discussed compliance. States compliance with medications for HLD  Reviewed scheduled/upcoming provider appointments  including: 08-01-2021 at 0900 am Discussed plans with patient for ongoing care management follow up and provided patient with direct contact information for care management team Patient Goals/Self-Care Activities: - call for medicine refill 2 or 3 days before it runs out - call if I am sick and can't take my medicine - keep a list of all the medicines I take; vitamins and herbals too - learn to read medicine labels - use a pillbox to sort medicine - use an alarm clock or phone to remind me to take my medicine - change to whole grain breads, cereal, pasta - drink 6 to 8 glasses of water each day - eat 3 to 5 servings of fruits and vegetables each day - eat 5 or 6 small meals each day - eat fish at least once per week - fill half the plate with nonstarchy vegetables - limit fast food meals to no more  than 1 per week - manage portion size - prepare main meal at home 3 to 5 days each week - read food labels for fat, fiber, carbohydrates and portion size - be open to making changes - I can manage, know and watch for signs of a heart attack - if I have chest pain, call for help - learn about small changes that will make a big difference - learn my personal risk factors - barriers to meeting goals identified - change-talk evoked - choices provided - collaboration with team encouraged - decision-making supported - health risks reviewed - problem-solving facilitated - questions answered - readiness for change evaluated - reassurance provided - self-reflection promoted - self-reliance encouraged - verbalization of feelings encouraged  Follow Up Plan: Face to Face appointment with care management team member scheduled for:  08-01-2021 at 0900 am       Plan:Face to Face appointment with care management team member scheduled for: 05-30-2021 at 0900 when the patient comes in to see the pcp.   Noreene Larsson RN, MSN, [facility] Mobile: [phone]

## 2021-05-31 ENCOUNTER — Other Ambulatory Visit: Payer: Self-pay

## 2021-05-31 ENCOUNTER — Encounter (INDEPENDENT_AMBULATORY_CARE_PROVIDER_SITE_OTHER): Payer: Self-pay | Admitting: Ophthalmology

## 2021-05-31 ENCOUNTER — Ambulatory Visit (INDEPENDENT_AMBULATORY_CARE_PROVIDER_SITE_OTHER): Payer: Medicare Other | Admitting: Ophthalmology

## 2021-05-31 ENCOUNTER — Ambulatory Visit (INDEPENDENT_AMBULATORY_CARE_PROVIDER_SITE_OTHER): Payer: Medicare Other

## 2021-05-31 DIAGNOSIS — F339 Major depressive disorder, recurrent, unspecified: Secondary | ICD-10-CM

## 2021-05-31 DIAGNOSIS — Z0000 Encounter for general adult medical examination without abnormal findings: Secondary | ICD-10-CM

## 2021-05-31 DIAGNOSIS — E113592 Type 2 diabetes mellitus with proliferative diabetic retinopathy without macular edema, left eye: Secondary | ICD-10-CM

## 2021-05-31 DIAGNOSIS — H2513 Age-related nuclear cataract, bilateral: Secondary | ICD-10-CM

## 2021-05-31 DIAGNOSIS — E113511 Type 2 diabetes mellitus with proliferative diabetic retinopathy with macular edema, right eye: Secondary | ICD-10-CM

## 2021-05-31 NOTE — Assessment & Plan Note (Signed)
The nature of cataract was discussed with the patient as well as the elective nature of surgery. The patient was reassured that surgery at a later date does not put the patient at risk for a worse outcome. It was emphasized that the need for surgery is dictated by the patient's quality of life as influenced by the cataract. Patient was instructed to maintain close follow up with their general eye care doctor.Cataract(s) account for the patient's complaint. I discussed the risks and benefits of cataract surgery. Options were explained to the patient. The patient understands that new glasses may not improve their vision and desires to have cataract surgery. I have recommended follow up with their general eye care doctor for evaluation and consideration of cataract extraction with new intraocular lens insertion.

## 2021-05-31 NOTE — Progress Notes (Signed)
Subjective:   Andrew Pruitt is a 65 y.o. male who presents for an Initial Medicare Annual Wellness Visit.I connected with  Andrew Pruitt on 05/31/21 by a audio enabled telemedicine application and verified that I am speaking with the correct person using two identifiers.   I discussed the limitations of evaluation and management by telemedicine. The patient expressed understanding and agreed to proceed.   Location of patient:home Location of provider:office Person participating in the visit: [name]  Review of Systems    Defer to PCP       Objective:    Today's Vitals   05/31/21 1716  PainSc: 7    There is no height or weight on file to calculate BMI.  Advanced Directives 05/31/2021 04/05/2021  Does Patient Have a Medical Advance Directive? No No  Would patient like information on creating a medical advance directive? No - Patient declined No - Patient declined    Current Medications (verified) Outpatient Encounter Medications as of 05/31/2021  Medication Sig   amLODipine (NORVASC) 5 MG tablet TAKE 1 TABLET BY MOUTH DAILY. FURTHER REFILLS WILL NEED TO COME FROM NEW PRIMARY CARE PROVIDER   atorvastatin (LIPITOR) 80 MG tablet Take 1 tablet (80 mg total) by mouth daily.   B-D UF III MINI PEN NEEDLES 31G X 5 MM MISC USE DAILY   Continuous Blood Gluc Receiver (FREESTYLE LIBRE 14 DAY READER) DEVI Use to check glucose levels TID; E11.9   Continuous Blood Gluc Sensor (FREESTYLE LIBRE 14 DAY SENSOR) MISC Use to check glucose TID. Change every 14 days. E 11.9   dapagliflozin propanediol (FARXIGA) 10 MG TABS tablet Take 1 tablet (10 mg total) by mouth daily before breakfast.   diclofenac Sodium (VOLTAREN) 1 % GEL Apply 2 g topically 4 (four) times daily.   fenofibrate (TRICOR) 145 MG tablet Take 1 tablet (145 mg total) by mouth daily.   gabapentin (NEURONTIN) 100 MG capsule Take 1 capsule (100 mg total) by mouth 3 (three) times daily.   hydrochlorothiazide (HYDRODIURIL) 25 MG  tablet TAKE 1 TABLET BY MOUTH EVERY DAY   insulin NPH Human (HUMULIN N) 100 UNIT/ML injection Inject 2 mLs (200 Units total) into the skin in the morning.   No facility-administered encounter medications on file as of 05/31/2021.    Allergies (verified) Niaspan [niacin]   History: Past Medical History:  Diagnosis Date   Arthritis    Depression    Diabetes mellitus without complication ([place])    Hyperlipidemia    Hypertension    Past Surgical History:  Procedure Laterality Date   TONSILLECTOMY     Family History  Problem Relation Age of Onset   Cancer Mother        Uterine    Early death Mother    Stroke Mother    Diabetes Father    Heart attack Father    Heart disease Father    Hyperlipidemia Father    Diabetes Brother    Alzheimer's disease Maternal Grandmother    Kidney failure Maternal Grandfather    Diabetes Paternal Grandfather    Diabetes Brother    Diabetes Brother    Colon cancer Neg Hx    Pancreatic cancer Neg Hx    Esophageal cancer Neg Hx    Social History   Socioeconomic History   Marital status: Married    Spouse name: Not on file   Number of children: Not on file   Years of education: Not on file   Highest education level: Not on file  Occupational History   Not on file  Tobacco Use   Smoking status: Never   Smokeless tobacco: Never  Vaping Use   Vaping Use: Never used  Substance and Sexual Activity   Alcohol use: Not Currently   Drug use: Never   Sexual activity: Yes  Other Topics Concern   Not on file  Social History Narrative   Not on file   Social Determinants of Health   Financial Resource Strain: Low Risk    Difficulty of Paying Living Expenses: Not very hard  Food Insecurity: No Food Insecurity   Worried About Running Out of Food in the Last Year: Never true   Ran Out of Food in the Last Year: Never true  Transportation Needs: No Transportation Needs   Lack of Transportation (Medical): No   Lack of Transportation  (Non-Medical): No  Physical Activity: Inactive   Days of Exercise per Week: 0 days   Minutes of Exercise per Session: 0 min  Stress: No Stress Concern Present   Feeling of Stress : Only a little  Social Connections: Moderately Isolated   Frequency of Communication with Friends and Family: More than three times a week   Frequency of Social Gatherings with Friends and Family: More than three times a week   Attends Religious Services: Never   Marine scientist or Organizations: No   Attends Music therapist: Never   Marital Status: Married    Tobacco Counseling Counseling given: Not Answered   Clinical Intake:  Pre-visit preparation completed: Yes  Pain : 0-10 Pain Score: 7  Pain Type: Chronic pain Pain Location: Hip Pain Orientation: Lateral Pain Descriptors / Indicators: Aching, Constant, Stabbing Pain Onset: More than a month ago Pain Frequency: Once a week Pain Relieving Factors: stop walking-sit Effect of Pain on Daily Activities: slows him down and can not do things he would like to do  Pain Relieving Factors: stop walking-sit  Nutritional Status: BMI > 30  Obese Nutritional Risks: Unintentional weight gain Diabetes: Yes CBG done?: Yes CBG resulted in Enter/ Edit results?: No Did pt. bring in CBG monitor from home?: Yes Glucose Meter Downloaded?: No  How often do you need to have someone help you when you read instructions, pamphlets, or other written materials from your doctor or pharmacy?: 1 - Never  Diabetic?yes  Interpreter Needed?: No  Information entered by :: Linus Galas   Activities of Daily Living In your present state of health, do you have any difficulty performing the following activities: 05/01/2021 12/21/2020  Hearing? N N  Vision? N Y  Difficulty concentrating or making decisions? N N  Walking or climbing stairs? Y Y  Dressing or bathing? N N  Doing errands, shopping? N N  Some recent data might be hidden    Patient  Care Team: Jon Billings, NP as PCP - General (Nurse Practitioner) Vanita Ingles, RN as Registered Nurse ([place]) Vladimir Faster, [facility] (Pharmacist)  Indicate any recent Medical Services you may have received from other than [facility] providers in the past year (date may be approximate).     Assessment:   This is a routine wellness examination for Donzel.  Hearing/Vision screen No results found.  Dietary issues and exercise activities discussed: Current Exercise Habits: The patient does not participate in regular exercise at present, Exercise limited by: orthopedic condition(s)   Goals Addressed   None   Depression Screen [facility] 2/9 Scores 05/31/2021 05/01/2021 02/07/2021 12/21/2020 06/24/2020  PHQ - 2 Score 4 1  0 0 0  PHQ- 9 Score 13 - - - -    Fall Risk Fall Risk  05/31/2021 05/01/2021 12/21/2020 02/11/2020  Falls in the past year? 0 0 0 0  Number falls in past yr: 0 0 0 0  Injury with Fall? 0 0 0 0  Risk for fall due to : - No Fall Risks - -  Follow up Falls evaluation completed Falls evaluation completed - Falls evaluation completed    FALL RISK PREVENTION PERTAINING TO THE HOME:  Any stairs in or around the home? Yes  If so, are there any without handrails? Yes  Home free of loose throw rugs in walkways, pet beds, electrical cords, etc? Yes  Adequate lighting in your home to reduce risk of falls? Yes   ASSISTIVE DEVICES UTILIZED TO PREVENT FALLS:  Life alert? No  Use of a cane, [place] or w/c? No  Grab bars in the bathroom? Yes  Shower chair or bench in shower? Yes  Elevated toilet seat or a handicapped toilet? Yes   TIMED UP AND GO:  Was the test performed? No .  Length of time to ambulate 10 feet: N/A sec.    Cognitive Function:     6CIT Screen 05/31/2021  What Year? 0 points  What month? 0 points  What time? 0 points  Count back from 20 0 points  Months in reverse 0 points  Repeat phrase 0 points  Total Score 0    Immunizations Immunization  History  Administered Date(s) Administered   Fluad Quad(high Dose 65+) 06/24/2020   Moderna Sars-Covid-2 Vaccination 10/13/2019, 11/09/2019, 08/18/2020   Pneumococcal Conjugate-13 03/25/2020   Pneumococcal Polysaccharide-23 08/24/2010, 05/01/2021   Tdap 08/24/2010    TDAP status: Due, Education has been provided regarding the importance of this vaccine. Advised may receive this vaccine at local pharmacy or Health Dept. Aware to provide a copy of the vaccination record if obtained from local pharmacy or Health Dept. Verbalized acceptance and understanding.  Flu Vaccine status: Up to date  Pneumococcal vaccine status: Up to date  Covid-19 vaccine status: Completed vaccines  Qualifies for Shingles Vaccine? Yes   Zostavax completed Yes   Shingrix Completed?: No.    Education has been provided regarding the importance of this vaccine. Patient has been advised to call insurance company to determine out of pocket expense if they have not yet received this vaccine. Advised may also receive vaccine at local pharmacy or Health Dept. Verbalized acceptance and understanding.  Screening Tests Health Maintenance  Topic Date Due   TETANUS/TDAP  08/24/2020   COVID-19 Vaccine (4 - Booster for Moderna series) 11/10/2020   URINE MICROALBUMIN  02/10/2021   INFLUENZA VACCINE  04/10/2021   Zoster Vaccines- Shingrix (1 of 2) 08/01/2021 (Originally 02/16/1975)   HEMOGLOBIN A1C  11/23/2021   OPHTHALMOLOGY EXAM  01/17/2022   FOOT EXAM  05/26/2022   Fecal DNA (Cologuard)  04/27/2023   Hepatitis C Screening  Completed   HPV VACCINES  Aged Out    Health Maintenance  Health Maintenance Due  Topic Date Due   TETANUS/TDAP  08/24/2020   COVID-19 Vaccine (4 - Booster for Moderna series) 11/10/2020   URINE MICROALBUMIN  02/10/2021   INFLUENZA VACCINE  04/10/2021    Colorectal cancer screening: Type of screening: Colonoscopy. Completed 2021. Repeat every 3 years  Lung Cancer Screening: (Low Dose CT Chest  recommended if Age 84-80 years, 30 pack-year currently smoking OR have quit w/in 15years.) does not qualify.   Lung Cancer Screening  Referral: N/C  Additional Screening:  Hepatitis C Screening: does not qualify; Completed N/AA  Vision Screening: Recommended annual ophthalmology exams for early detection of glaucoma and other disorders of the eye. Is the patient up to date with their annual eye exam?  Yes  Who is the provider or what is the name of the office in which the patient attends annual eye exams? Dr.Rankin If pt is not established with a provider, would they like to be referred to a provider to establish care? Yes .   Dental Screening: Recommended annual dental exams for proper oral hygiene  Community Resource Referral / Chronic Care Management: CRR required this visit?  No   CCM required this visit?  No      Plan:     I have personally reviewed and noted the following in the patient's chart:   Medical and social history Use of alcohol, tobacco or illicit drugs  Current medications and supplements including opioid prescriptions. Patient is not currently taking opioid prescriptions. Functional ability and status Nutritional status Physical activity Advanced directives List of other physicians Hospitalizations, surgeries, and ER visits in previous 12 months Vitals Screenings to include cognitive, depression, and falls Referrals and appointments  In addition, I have reviewed and discussed with patient certain preventive protocols, quality metrics, and best practice recommendations. A written personalized care plan for preventive services as well as general preventive health recommendations were provided to patient.     Linus Galas, [place]   05/31/2021   Nurse Notes: Non face to face 60 minutes  Mr. Jasmer , Thank you for taking time to come for your Medicare Wellness Visit. I appreciate your ongoing commitment to your health goals. Please review the following plan  we discussed and let me know if I can assist you in the future.   These are the goals we discussed:  Goals      Lifestyle Change-Hypertension     Timeframe:  Short-Term Goal Priority:  High Start Date:                             Expected End Date:                       Follow Up Date 2 months- agree to work together to make changes - ask questions to understand - learn about high blood pressure    Why is this important?   The changes that you are asked to make may be hard to do.  This is especially true when the changes are life-long.  Knowing why it is important to you is the first step.  Working on the change with your family or support person helps you not feel alone.  Reward yourself and family or support person when goals are met. This can be an activity you choose like bowling, hiking, biking, swimming or shooting hoops.     Notes:      RNCM: Monitor and Manage My Blood Sugar-Diabetes Type 2     Timeframe:  Long-Range Goal Priority:  High Start Date: 02-07-2021                            Expected End Date:    02-07-2022                   Follow Up Date 08-01-2021   - check blood sugar at prescribed  times - check blood sugar before and after exercise - check blood sugar if I feel it is too high or too low - enter blood sugar readings and medication or insulin into daily log - take the blood sugar log to all doctor visits - take the blood sugar meter to all doctor visits    Why is this important?   Checking your blood sugar at home helps to keep it from getting very high or very low.  Writing the results in a diary or log helps the doctor know how to care for you.  Your blood sugar log should have the time, date and the results.  Also, write down the amount of insulin or other medicine that you take.  Other information, like what you ate, exercise done and how you were feeling, will also be helpful.     Lab Results  Component Value Date   HGBA1C 11.6 (A) 05/26/2021      Notes: Patient with hemoglobin A1C of 11.8 on 01-24-2021- blood sugar reading range 88 to 350. 03-28-2021: States he is having blood sugars under 200 now. Sometimes down to 80. Denies any extreme lows. Is working on getting his levels down.  Could not get approved for Trulicity because he makes too much money but did get approved for Fargixa. Saw endocrinologist in June and will see again in August. 05-30-2021: The patient saw the endocrinologist on 05-26-2021 and A1C was 11.6. The patient states that they have raised his insulin to 200 units every am. Discussed DM management and health. He will follow up with the endocrinologist in 2 months.      Track and Manage My Blood Pressure-Hypertension     Timeframe:  Long-Range Goal Priority:  High Start Date:                             Expected End Date:                       Follow Up Date 2 months  - check blood pressure daily - write blood pressure results in a log or diary    Why is this important?   You won't feel high blood pressure, but it can still hurt your blood vessels.  High blood pressure can cause heart or kidney problems. It can also cause a stroke.  Making lifestyle changes like losing a little weight or eating less salt will help.  Checking your blood pressure at home and at different times of the day can help to control blood pressure.  If the doctor prescribes medicine remember to take it the way the doctor ordered.  Call the office if you cannot afford the medicine or if there are questions about it.     Notes:         This is a list of the screening recommended for you and due dates:  Health Maintenance  Topic Date Due   Tetanus Vaccine  08/24/2020   COVID-19 Vaccine (4 - Booster for Moderna series) 11/10/2020   Urine Protein Check  02/10/2021   Flu Shot  04/10/2021   Zoster (Shingles) Vaccine (1 of 2) 08/01/2021*   Hemoglobin A1C  11/23/2021   Eye exam for diabetics  01/17/2022   Complete foot exam   05/26/2022    Cologuard (Stool DNA test)  04/27/2023   Hepatitis C Screening: USPSTF Recommendation to screen - Ages 65-79 yo.  Completed  HPV Vaccine  Aged Out  *Topic was postponed. The date shown is not the original due date.

## 2021-05-31 NOTE — Assessment & Plan Note (Signed)
Stable NPDR OU.  No visible progression will observe

## 2021-05-31 NOTE — Patient Instructions (Signed)
Health Maintenance, Male Adopting a healthy lifestyle and getting preventive care are important in promoting health and wellness. Ask your health care provider about: The right schedule for you to have regular tests and exams. Things you can do on your own to prevent diseases and keep yourself healthy. What should I know about diet, weight, and exercise? Eat a healthy diet  Eat a diet that includes plenty of vegetables, fruits, low-fat dairy products, and lean protein. Do not eat a lot of foods that are high in solid fats, added sugars, or sodium. Maintain a healthy weight Body mass index (BMI) is a measurement that can be used to identify possible weight problems. It estimates body fat based on height and weight. Your health care provider can help determine your BMI and help you achieve or maintain a healthy weight. Get regular exercise Get regular exercise. This is one of the most important things you can do for your health. Most adults should: Exercise for at least 150 minutes each week. The exercise should increase your heart rate and make you sweat (moderate-intensity exercise). Do strengthening exercises at least twice a week. This is in addition to the moderate-intensity exercise. Spend less time sitting. Even light physical activity can be beneficial. Watch cholesterol and blood lipids Have your blood tested for lipids and cholesterol at 66 years of age, then have this test every 5 years. You may need to have your cholesterol levels checked more often if: Your lipid or cholesterol levels are high. You are older than 66 years of age. You are at high risk for heart disease. What should I know about cancer screening? Many types of cancers can be detected early and may often be prevented. Depending on your health history and family history, you may need to have cancer screening at various ages. This may include screening for: Colorectal cancer. Prostate cancer. Skin cancer. Lung  cancer. What should I know about heart disease, diabetes, and high blood pressure? Blood pressure and heart disease High blood pressure causes heart disease and increases the risk of stroke. This is more likely to develop in people who have high blood pressure readings, are of African descent, or are overweight. Talk with your health care provider about your target blood pressure readings. Have your blood pressure checked: Every 3-5 years if you are 18-39 years of age. Every year if you are 40 years old or older. If you are between the ages of 65 and 75 and are a current or former smoker, ask your health care provider if you should have a one-time screening for abdominal aortic aneurysm (AAA). Diabetes Have regular diabetes screenings. This checks your fasting blood sugar level. Have the screening done: Once every three years after age 45 if you are at a normal weight and have a low risk for diabetes. More often and at a younger age if you are overweight or have a high risk for diabetes. What should I know about preventing infection? Hepatitis B If you have a higher risk for hepatitis B, you should be screened for this virus. Talk with your health care provider to find out if you are at risk for hepatitis B infection. Hepatitis C Blood testing is recommended for: Everyone born from 1945 through 1965. Anyone with known risk factors for hepatitis C. Sexually transmitted infections (STIs) You should be screened each year for STIs, including gonorrhea and chlamydia, if: You are sexually active and are younger than 66 years of age. You are older than 66 years   of age and your health care provider tells you that you are at risk for this type of infection. Your sexual activity has changed since you were last screened, and you are at increased risk for chlamydia or gonorrhea. Ask your health care provider if you are at risk. Ask your health care provider about whether you are at high risk for HIV.  Your health care provider may recommend a prescription medicine to help prevent HIV infection. If you choose to take medicine to prevent HIV, you should first get tested for HIV. You should then be tested every 3 months for as long as you are taking the medicine. Follow these instructions at home: Lifestyle Do not use any products that contain nicotine or tobacco, such as cigarettes, e-cigarettes, and chewing tobacco. If you need help quitting, ask your health care provider. Do not use street drugs. Do not share needles. Ask your health care provider for help if you need support or information about quitting drugs. Alcohol use Do not drink alcohol if your health care provider tells you not to drink. If you drink alcohol: Limit how much you have to 0-2 drinks a day. Be aware of how much alcohol is in your drink. In the [place] one drink equals one 12 oz bottle of beer (355 mL), one 5 oz glass of wine (148 mL), or one 1 oz glass of hard liquor (44 mL). General instructions Schedule regular health, dental, and eye exams. Stay current with your vaccines. Tell your health care provider if: You often feel depressed. You have ever been abused or do not feel safe at home. Summary Adopting a healthy lifestyle and getting preventive care are important in promoting health and wellness. Follow your health care provider's instructions about healthy diet, exercising, and getting tested or screened for diseases. Follow your health care provider's instructions on monitoring your cholesterol and blood pressure. This information is not intended to replace advice given to you by your health care provider. Make sure you discuss any questions you have with your health care provider. Document Revised: 11/04/2020 Document Reviewed: 08/20/2018 Elsevier Patient Education  2022 [employer]  

## 2021-05-31 NOTE — Progress Notes (Signed)
05/31/2021     CHIEF COMPLAINT Patient presents for  Chief Complaint  Patient presents with   Retina Follow Up    4 Mo F/U OU  Pt reports [facility] OU is "doing good." Pt sts, "no floaters" OU. No new symptoms OU. A1c: 11.0, 04/22 LBS: 200-300 on avg per pt      HISTORY OF PRESENT ILLNESS: Andrew Pruitt is a 65 y.o. male who presents to the clinic today for:   HPI     Retina Follow Up   Patient presents with  Diabetic Retinopathy.  In both eyes.  This started 4 months ago.  Severity is mild.  Duration of 4 months.  Since onset it is gradually improving. Additional comments: 4 Mo F/U OU  Pt reports VA OU is "doing good." Pt sts, "no floaters" OU. No new symptoms OU. A1c: 11.0, 04/22 LBS: 200-300 on avg per pt        Comments   18 weeks (4 months) fu ou oct fp.  Patient states vision is stable and unchanged since last visit. Denies any new floaters or FOL.       Last edited by Laurin Coder on 05/31/2021  2:22 PM.      Referring physician: Jon Billings, NP [place],  [place] [address]  HISTORICAL INFORMATION:   Selected notes from the MEDICAL RECORD NUMBER    Lab Results  Component Value Date   HGBA1C 11.6 (A) 05/26/2021     CURRENT MEDICATIONS: No current outpatient medications on file. (Ophthalmic Drugs)   No current facility-administered medications for this visit. (Ophthalmic Drugs)   Current Outpatient Medications (Other)  Medication Sig   amLODipine (NORVASC) 5 MG tablet TAKE 1 TABLET BY MOUTH DAILY. FURTHER REFILLS WILL NEED TO COME FROM NEW PRIMARY CARE PROVIDER   atorvastatin (LIPITOR) 80 MG tablet Take 1 tablet (80 mg total) by mouth daily.   B-D UF III MINI PEN NEEDLES 31G X 5 MM MISC USE DAILY   Continuous Blood Gluc Receiver (FREESTYLE LIBRE 14 DAY READER) DEVI Use to check glucose levels TID; E11.9   Continuous Blood Gluc Sensor (FREESTYLE LIBRE 14 DAY SENSOR) MISC Use to check glucose TID. Change every 14 days. E 11.9    dapagliflozin propanediol (FARXIGA) 10 MG TABS tablet Take 1 tablet (10 mg total) by mouth daily before breakfast.   diclofenac Sodium (VOLTAREN) 1 % GEL Apply 2 g topically 4 (four) times daily.   fenofibrate (TRICOR) 145 MG tablet Take 1 tablet (145 mg total) by mouth daily.   gabapentin (NEURONTIN) 100 MG capsule Take 1 capsule (100 mg total) by mouth 3 (three) times daily.   hydrochlorothiazide (HYDRODIURIL) 25 MG tablet TAKE 1 TABLET BY MOUTH EVERY DAY   insulin NPH Human (HUMULIN N) 100 UNIT/ML injection Inject 2 mLs (200 Units total) into the skin in the morning.   No current facility-administered medications for this visit. (Other)      REVIEW OF SYSTEMS:    ALLERGIES Allergies  Allergen Reactions   Niaspan [Niacin] Other (See Comments)    Red skin, red eyes    PAST MEDICAL HISTORY Past Medical History:  Diagnosis Date   Arthritis    Depression    Diabetes mellitus without complication ([place])    Hyperlipidemia    Hypertension    Past Surgical History:  Procedure Laterality Date   TONSILLECTOMY      FAMILY HISTORY Family History  Problem Relation Age of Onset   Cancer Mother  Uterine    Early death Mother    Stroke Mother    Diabetes Father    Heart attack Father    Heart disease Father    Hyperlipidemia Father    Diabetes Brother    Alzheimer's disease Maternal Grandmother    Kidney failure Maternal Grandfather    Diabetes Paternal Grandfather    Diabetes Brother    Diabetes Brother    Colon cancer Neg Hx    Pancreatic cancer Neg Hx    Esophageal cancer Neg Hx     SOCIAL HISTORY Social History   Tobacco Use   Smoking status: Never   Smokeless tobacco: Never  Vaping Use   Vaping Use: Never used  Substance Use Topics   Alcohol use: Not Currently   Drug use: Never         OPHTHALMIC EXAM:  Base Eye Exam     Visual Acuity (ETDRS)       Right Left   Dist [place] 20/40 -1 20/60   Dist ph [place] 20/25 -2 20/40 -1         Tonometry  (Tonopen, 2:24 PM)       Right Left   Pressure 18 12         Pupils       Pupils Dark Light React APD   Right PERRL 5 4 Sluggish None   Left PERRL 5 4 Sluggish None         Extraocular Movement       Right Left    Full Full         Neuro/Psych     Oriented x3: Yes   Mood/Affect: Normal         Dilation     Both eyes: 1.0% Mydriacyl, 2.5% Phenylephrine @ 2:24 PM           Slit Lamp and Fundus Exam     External Exam       Right Left   External Normal Normal         Slit Lamp Exam       Right Left   Lids/Lashes Normal Normal   Conjunctiva/Sclera White and quiet White and quiet   Cornea Clear Clear   Anterior Chamber Deep and quiet Deep and quiet   Iris Round and reactive Round and reactive   Lens 2+ Nuclear sclerosis 2+ Nuclear sclerosis   Anterior Vitreous no cells no cells         Fundus Exam       Right Left   Posterior Vitreous Posterior vitreous detachment Posterior vitreous detachment   Disc Normal Normal   C/D Ratio 0.4 0.45   Macula Microaneurysms,  , no exudates, no clinically significant macular edema, no macular thickening Microaneurysms, no macular thickening   Vessels NPDR-Severe NPDR-Severe   Periphery horseshoe retinal tear 7:00 with good retinopexy surrounding Normal, good PRP anterior,             IMAGING AND PROCEDURES  Imaging and Procedures for 05/31/21  OCT, Retina - OU - Both Eyes       Right Eye Quality was good. Scan locations included subfoveal. Central Foveal Thickness: 289. Progression has been stable. Findings include abnormal foveal contour.   Left Eye Quality was good. Scan locations included subfoveal. Central Foveal Thickness: 295. Progression has been stable. Findings include abnormal foveal contour.   Notes No active CSME at this time yet with poor blood sugar control and history of CSME and underlying severe NPDR will  need to follow-up in 4 months     Color Fundus Photography Optos - OU  - Both Eyes       Right Eye Progression has been stable. Disc findings include normal observations. Macula : microaneurysms. Vessels : IRMA. Periphery : tear.   Left Eye Progression has been stable. Disc findings include normal observations. Macula : microaneurysms.   Notes OD with good retinopexy 7:00 meridian, no new breaks.  Severe NPDR stable OU.  Good PRP peripherally anteriorly OU, stable             ASSESSMENT/PLAN:  Nuclear sclerotic cataract of both eyes The nature of cataract was discussed with the patient as well as the elective nature of surgery. The patient was reassured that surgery at a later date does not put the patient at risk for a worse outcome. It was emphasized that the need for surgery is dictated by the patient's quality of life as influenced by the cataract. Patient was instructed to maintain close follow up with their general eye care doctor.Cataract(s) account for the patient's complaint. I discussed the risks and benefits of cataract surgery. Options were explained to the patient. The patient understands that new glasses may not improve their vision and desires to have cataract surgery. I have recommended follow up with their general eye care doctor for evaluation and consideration of cataract extraction with new intraocular lens insertion.  Type 2 diabetes mellitus with proliferative diabetic retinopathy of left eye without macular edema (HCC) Stable NPDR OU.  No visible progression will observe      ICD-10-CM   1. Diabetic macular edema of right eye with proliferative retinopathy associated with type 2 diabetes mellitus (HCC)  E11.3511 OCT, Retina - OU - Both Eyes    Color Fundus Photography Optos - OU - Both Eyes    2. Nuclear sclerotic cataract of both eyes  H25.13     3. Type 2 diabetes mellitus with proliferative diabetic retinopathy of left eye without macular edema, unspecified whether long term insulin use ([place])  D74.1287       1.  No  further residual neovascular disease noted.  But remnants of severe NPDR remain OU.  Good peripheral anterior PRP noted OU.  Thus severe NPDR remained stable.  DR in the past no longer visible  2.  Bilateral dense cataract patient needs cataract referral to [facility] for consideration of cataract to improve acuity  3.  Ophthalmic Meds Ordered this visit:  No orders of the defined types were placed in this encounter.      Return in about 4 months (around 09/30/2021) for DILATE OU, COLOR FP, OCT.  There are no Patient Instructions on file for this visit.   Explained the diagnoses, plan, and follow up with the patient and they expressed understanding.  Patient expressed understanding of the importance of proper follow up care.   Clent Demark Tate Jerkins M.D. Diseases & Surgery of the Retina and Vitreous [facility] 05/31/21     Abbreviations: M myopia (nearsighted); A astigmatism; H hyperopia (farsighted); P presbyopia; Mrx spectacle prescription;  CTL contact lenses; OD right eye; OS left eye; OU both eyes  XT exotropia; ET esotropia; PEK punctate epithelial keratitis; PEE punctate epithelial erosions; DES dry eye syndrome; MGD meibomian gland dysfunction; ATs artificial tears; PFAT's preservative free artificial tears; [place] nuclear sclerotic cataract; PSC posterior subcapsular cataract; ERM epi-retinal membrane; PVD posterior vitreous detachment; RD retinal detachment; DM diabetes mellitus; DR diabetic retinopathy; NPDR non-proliferative diabetic retinopathy;  PDR proliferative diabetic retinopathy; CSME clinically significant macular edema; DME diabetic macular edema; dbh dot blot hemorrhages; CWS cotton wool spot; POAG primary open angle glaucoma; C/D cup-to-disc ratio; HVF humphrey visual field; GVF goldmann visual field; OCT optical coherence tomography; IOP intraocular pressure; BRVO Branch retinal vein occlusion; CRVO central retinal vein occlusion; CRAO central retinal  artery occlusion; BRAO branch retinal artery occlusion; RT retinal tear; SB scleral buckle; PPV pars plana vitrectomy; VH Vitreous hemorrhage; PRP panretinal laser photocoagulation; IVK intravitreal kenalog; VMT vitreomacular traction; MH Macular hole;  NVD neovascularization of the disc; NVE neovascularization elsewhere; AREDS age related eye disease study; ARMD age related macular degeneration; POAG primary open angle glaucoma; EBMD epithelial/anterior basement membrane dystrophy; ACIOL anterior chamber intraocular lens; IOL intraocular lens; PCIOL posterior chamber intraocular lens; Phaco/IOL phacoemulsification with intraocular lens placement; [place] photorefractive keratectomy; LASIK laser assisted in situ keratomileusis; HTN hypertension; DM diabetes mellitus; COPD chronic obstructive pulmonary disease

## 2021-06-08 ENCOUNTER — Other Ambulatory Visit: Payer: Self-pay | Admitting: Endocrinology

## 2021-06-09 DIAGNOSIS — E1159 Type 2 diabetes mellitus with other circulatory complications: Secondary | ICD-10-CM | POA: Diagnosis not present

## 2021-06-09 DIAGNOSIS — E1122 Type 2 diabetes mellitus with diabetic chronic kidney disease: Secondary | ICD-10-CM | POA: Diagnosis not present

## 2021-06-09 DIAGNOSIS — E1165 Type 2 diabetes mellitus with hyperglycemia: Secondary | ICD-10-CM | POA: Diagnosis not present

## 2021-06-09 DIAGNOSIS — E785 Hyperlipidemia, unspecified: Secondary | ICD-10-CM | POA: Diagnosis not present

## 2021-06-09 DIAGNOSIS — E1169 Type 2 diabetes mellitus with other specified complication: Secondary | ICD-10-CM

## 2021-06-09 DIAGNOSIS — I152 Hypertension secondary to endocrine disorders: Secondary | ICD-10-CM

## 2021-06-14 ENCOUNTER — Other Ambulatory Visit: Payer: Self-pay | Admitting: Nurse Practitioner

## 2021-06-19 ENCOUNTER — Ambulatory Visit (INDEPENDENT_AMBULATORY_CARE_PROVIDER_SITE_OTHER): Payer: Medicare Other | Admitting: Dermatology

## 2021-06-19 ENCOUNTER — Other Ambulatory Visit: Payer: Self-pay

## 2021-06-19 DIAGNOSIS — B078 Other viral warts: Secondary | ICD-10-CM | POA: Diagnosis not present

## 2021-06-19 DIAGNOSIS — D485 Neoplasm of uncertain behavior of skin: Secondary | ICD-10-CM

## 2021-06-19 NOTE — Patient Instructions (Addendum)
Wound Care Instructions  Cleanse wound gently with soap and water once a day then pat dry with clean gauze. Apply a thing coat of Petrolatum (petroleum jelly, "Vaseline") over the wound (unless you have an allergy to this). We recommend that you use a new, sterile tube of Vaseline. Do not pick or remove scabs. Do not remove the yellow or white "healing tissue" from the base of the wound.  Cover the wound with fresh, clean, nonstick gauze and secure with paper tape. You may use Band-Aids in place of gauze and tape if the would is small enough, but would recommend trimming much of the tape off as there is often too much. Sometimes Band-Aids can irritate the skin.  You should call the office for your biopsy report after 1 week if you have not already been contacted.  If you experience any problems, such as abnormal amounts of bleeding, swelling, significant bruising, significant pain, or evidence of infection, please call the office immediately.  FOR ADULT SURGERY PATIENTS: If you need something for pain relief you may take 1 extra strength Tylenol (acetaminophen) AND 2 Ibuprofen (200mg each) together every 4 hours as needed for pain. (do not take these if you are allergic to them or if you have a reason you should not take them.) Typically, you may only need pain medication for 1 to 3 days.   If you have any questions or concerns for your doctor, please call our main line at [phone] and press option 4 to reach your doctor's medical assistant. If no one answers, please leave a voicemail as directed and we will return your call as soon as possible. Messages left after 4 pm will be answered the following business day.   You may also send us a message via MyChart. We typically respond to MyChart messages within 1-2 business days.  For prescription refills, please ask your pharmacy to contact our office. Our fax number is [phone].  If you have an urgent issue when the clinic is closed that  cannot wait until the next business day, you can page your doctor at the number below.    Please note that while we do our best to be available for urgent issues outside of office hours, we are not available 24/7.   If you have an urgent issue and are unable to reach us, you may choose to seek medical care at your doctor's office, retail clinic, urgent care center, or emergency room.  If you have a medical emergency, please immediately call 911 or go to the emergency department.  Pager Numbers  - Dr. Kowalski: [phone]  - Dr. Moye: [phone]  - Dr. Stewart: [phone]  In the event of inclement weather, please call our main line at [phone] for an update on the status of any delays or closures.  Dermatology Medication Tips: Please keep the boxes that topical medications come in in order to help keep track of the instructions about where and how to use these. Pharmacies typically print the medication instructions only on the boxes and not directly on the medication tubes.   If your medication is too expensive, please contact our office at [phone] option 4 or send us a message through MyChart.   We are unable to tell what your co-pay for medications will be in advance as this is different depending on your insurance coverage. However, we may be able to find a substitute medication at lower cost or fill out paperwork to get insurance to cover a needed   medication.   If a prior authorization is required to get your medication covered by your insurance company, please allow us 1-2 business days to complete this process.  Drug prices often vary depending on where the prescription is filled and some pharmacies may offer cheaper prices.  The website www.goodrx.com contains coupons for medications through different pharmacies. The prices here do not account for what the cost may be with help from insurance (it may be cheaper with your insurance), but the website can give you the  price if you did not use any insurance.  - You can print the associated coupon and take it with your prescription to the pharmacy.  - You may also stop by our office during regular business hours and pick up a GoodRx coupon card.  - If you need your prescription sent electronically to a different pharmacy, notify our office through [facility] MyChart or by phone at [phone] option 4.   

## 2021-06-19 NOTE — Progress Notes (Signed)
   New Patient Visit  Subjective  Andrew Pruitt is a 65 y.o. male who presents for the following: Skin Problem (Patient here today for growths at feet. [facility] sent patient here for what they think is calcium build up, present for about 1 1/2 years, rough and irritating).  Patient with no hx of skin cancer.   The following portions of the chart were reviewed this encounter and updated as appropriate:   Tobacco  Allergies  Meds  Problems  Med Hx  Surg Hx  Fam Hx     Review of Systems:  No other skin or systemic complaints except as noted in HPI or Assessment and Plan.  Objective  Well appearing patient in no apparent distress; mood and affect are within normal limits.  A focused examination was performed including bilateral feet. Relevant physical exam findings are noted in the Assessment and Plan.  Left Hand Hyperkeratotic papules ranging from 2 mm to 1 cm 0.7cm hyperkeratotic crusted papule at left hand Keratoderma vs viral wart vs psoriasis          Assessment & Plan  Neoplasm of uncertain behavior of skin /rash Consistent with confluent viral warts of the hands and feet. Other possibilities include a Keratoderma or Psoriasis. Chronic and persistent for over a year  Left Hand  Skin / nail biopsy Type of biopsy: tangential   Informed consent: discussed and consent obtained   Timeout: patient name, date of birth, surgical site, and procedure verified   Procedure prep:  Patient was prepped and draped in usual sterile fashion Prep type:  Isopropyl alcohol Anesthesia: the lesion was anesthetized in a standard fashion   Anesthetic:  1% lidocaine w/ epinephrine 1-100,000 buffered w/ 8.4% NaHCO3 Instrument used: flexible razor blade   Hemostasis achieved with: pressure, aluminum chloride and electrodesiccation   Outcome: patient tolerated procedure well   Post-procedure details: sterile dressing applied and wound care instructions given   Dressing type:  petrolatum and bandage    Specimen 1 - Surgical pathology Differential Diagnosis: Keratoderma vs viral wart vs psoriasis  Check Margins: No Hyperkeratotic papules ranging from 2 mm to 1 cm 0.7cm hyperkeratotic crusted papule at left hand 2 pieces  Return in about 2 weeks (around 07/03/2021) for Wart.  Graciella Belton, RMA, am acting as scribe for Sarina Ser, MD .  Documentation: I have reviewed the above documentation for accuracy and completeness, and I agree with the above.  Sarina Ser, MD

## 2021-06-20 ENCOUNTER — Encounter: Payer: Self-pay | Admitting: Dermatology

## 2021-06-21 ENCOUNTER — Telehealth: Payer: Self-pay

## 2021-06-21 NOTE — Telephone Encounter (Signed)
-----   Message from Ralene Bathe, MD sent at 06/20/2021  8:02 PM EDT ----- Diagnosis Skin , left hand VERRUCA VULGARIS, ENDOPHYTIC TYPE  Benign viral wart May recur and spread to other areas We can probably assume all spots on hands and feet are also viral warts. Keep follow up appt to discuss treatment options

## 2021-06-21 NOTE — Telephone Encounter (Signed)
Advised patient of results/hd  

## 2021-06-27 ENCOUNTER — Telehealth: Payer: Self-pay

## 2021-06-27 NOTE — Chronic Care Management (AMB) (Signed)
Chronic Care Management Pharmacy Assistant   Name: Andrew Pruitt  MRN: [id number] DOB: December 15, 1955   Reason for Encounter: Disease State General assessment     Recent office visits:  05/01/21 Jon Billings NP - Seen for diabetes - Labs ordered - increase Amlodipine to 10 mg - Follow up in 3 months   Recent consult visits:  06/19/21 Ralene Bathe MD - Dermatology - Seen for Neoplasm of uncertain behavior of skin - No medication changes noted - Follow up in 2 weeks  05/31/21 Hurman Horn MD - Ophthalmology - Seen for Diabetic macular edema of right eye with proliferative retinopathy associated with type 2 diabetes mellitus -  No medication changes noted - Follow up in 4 months  05/26/21 Renato Shin MD - Endocrinology - Seen for diabetes - Labs ordered - increase the insulin to 200 units each morning - Follow up in 2 months  03/09/21 Renato Shin MD - Endocrinology - Seen for diabetes - Labs ordered -  increase the insulin to 170 units each morning - Follow up in 2 months     Hospital visits:   Admitted to the hospital on 04/05/21 due to chest pain. Discharge date was 04/05/21. Discharged from [facility]?Medications Started at [facility] Discharge:?? N/a  Medication Changes at Hospital Discharge: N/a  Medications Discontinued at Hospital Discharge: N/a  Medications that remain the same after Hospital Discharge:??  -All other medications will remain the same.    Medications: Outpatient Encounter Medications as of 06/27/2021  Medication Sig   amLODipine (NORVASC) 5 MG tablet TAKE 1 TABLET BY MOUTH DAILY. FURTHER REFILLS WILL NEED TO COME FROM NEW PRIMARY CARE PROVIDER   atorvastatin (LIPITOR) 80 MG tablet Take 1 tablet (80 mg total) by mouth daily.   B-D UF III MINI PEN NEEDLES 31G X 5 MM MISC USE DAILY   Continuous Blood Gluc Receiver (FREESTYLE LIBRE 14 DAY READER) DEVI Use to check glucose levels TID; E11.9   Continuous Blood  Gluc Sensor (FREESTYLE LIBRE 14 DAY SENSOR) MISC Use to check glucose TID. Change every 14 days. E 11.9   dapagliflozin propanediol (FARXIGA) 10 MG TABS tablet Take 1 tablet (10 mg total) by mouth daily before breakfast.   diclofenac Sodium (VOLTAREN) 1 % GEL Apply 2 g topically 4 (four) times daily.   fenofibrate (TRICOR) 145 MG tablet Take 1 tablet (145 mg total) by mouth daily.   gabapentin (NEURONTIN) 100 MG capsule Take 1 capsule (100 mg total) by mouth 3 (three) times daily.   hydrochlorothiazide (HYDRODIURIL) 25 MG tablet TAKE 1 TABLET BY MOUTH EVERY DAY   insulin NPH Human (HUMULIN N) 100 UNIT/ML injection Inject 2 mLs (200 Units total) into the skin in the morning.   No facility-administered encounter medications on file as of 06/27/2021.   Have you had any problems recently with your health? Patient states that he has arthritis pain every once in a while but states that it is not bad.   Have you had any problems with your pharmacy? Patient states that he has no problems with pharmacy currently.   What issues or side effects are you having with your medications? Patient states that he has no concerns regarding side effects at the moment.   What would you like me to pass along to [facility] for them to help you with?  N/a  What can we do to take care of you better?  N/a   Scheduled an appointment with  Madelin Rear via telephone for 08/01/21 @ 11 am.    Care Gaps: URINE MICROALBUMIN Last completed: Feb 11, 2020   Star Rating Drugs: atorvastatin (LIPITOR) 80 MG tablet 06/21/2021 DS unknown  dapagliflozin propanediol (FARXIGA) 10 MG TABS tablet 06/21/2021 DS unknown  insulin NPH Human (HUMULIN N) 100 UNIT/ML injection 06/21/2021 DS unknown   Andee Poles, CMA

## 2021-07-03 ENCOUNTER — Other Ambulatory Visit: Payer: Self-pay

## 2021-07-03 ENCOUNTER — Ambulatory Visit (INDEPENDENT_AMBULATORY_CARE_PROVIDER_SITE_OTHER): Payer: Medicare Other | Admitting: Dermatology

## 2021-07-03 DIAGNOSIS — B078 Other viral warts: Secondary | ICD-10-CM | POA: Diagnosis not present

## 2021-07-03 NOTE — Progress Notes (Signed)
   Follow-Up Visit   Subjective  Andrew Pruitt is a 64 y.o. male who presents for the following: Warts (Patient here today for 2 week wart follow up, biopsy proven at left hand. Warts at bilateral feet and left hand. ).  No treatment discussed at last visit.   The following portions of the chart were reviewed this encounter and updated as appropriate:   Tobacco  Allergies  Meds  Problems  Med Hx  Surg Hx  Fam Hx     Review of Systems:  No other skin or systemic complaints except as noted in HPI or Assessment and Plan.  Objective  Well appearing patient in no apparent distress; mood and affect are within normal limits.  A focused examination was performed including hands, feet. Relevant physical exam findings are noted in the Assessment and Plan.  hands and feet > 20 (20) Verrucous papules -- Discussed viral etiology and contagion, biopsy proven   Assessment & Plan  Viral warts (20) - biopsy proven  Discussed biopsy results and treatment options hands and feet > 20  Discussed viral etiology and risk of spread.  Discussed multiple treatments may be required to clear warts.  Discussed possible post-treatment dyspigmentation and risk of recurrence.  Start 5FU/salicylic acid paste nightly and cover, wash off in the morning.   Destruction of lesion - hands and feet > 20 Complexity: simple   Destruction method: cryotherapy   Informed consent: discussed and consent obtained   Timeout:  patient name, date of birth, surgical site, and procedure verified Lesion destroyed using liquid nitrogen: Yes   Region frozen until ice ball extended beyond lesion: Yes   Outcome: patient tolerated procedure well with no complications   Post-procedure details: wound care instructions given    Destruction of lesion - hands and feet > 20  Destruction method: chemical removal   Informed consent: discussed and consent obtained   Timeout:  patient name, date of birth, surgical site, and  procedure verified Chemical destruction method: cantharidin   Application time:  4 hours Procedure instructions: patient instructed to wash and dry area   Outcome: patient tolerated procedure well with no complications   Post-procedure details: wound care instructions given    Return in about 4 weeks (around 07/31/2021) for Wart.  Graciella Belton, RMA, am acting as scribe for Sarina Ser, MD . Documentation: I have reviewed the above documentation for accuracy and completeness, and I agree with the above.  Sarina Ser, MD

## 2021-07-03 NOTE — Patient Instructions (Addendum)
Discussed viral etiology and risk of spread.  Discussed multiple treatments may be required to clear warts.  Discussed possible post-treatment dyspigmentation and risk of recurrence.   Instructions for After In-Office Application of Cantharidin  1. This is a strong medicine; please follow ALL instructions.  2. Gently wash off with soap and water in four hours or sooner s directed by your physician.  3. **WARNING** this medicine can cause severe blistering, blood blisters, infection, and/or scarring if it is not washed off as directed.  4. Your progress will be rechecked in 1-2 months; call sooner if there are any questions or problems.  Cantharidin Plus is a blistering agent that comes from a beetle.  It needs to be washed off in about 4 hours after application.  Although it is painless when applied in office, it may cause symptoms of mild pain and burning several hours later.  Treated areas will swell and turn red, and blisters may form.  Vaseline and a bandaid may be applied until wound has healed.  Once healed, the skin may remain temporarily discolored.  It can take weeks to months for pigmentation to return to normal.  Advised to wash off with soap and water in 4 hours or sooner if it becomes tender before then.  Cryotherapy Aftercare  Wash gently with soap and water everyday.   Apply Vaseline and Band-Aid daily until healed.   If you have any questions or concerns for your doctor, please call our main line at [phone] and press option 4 to reach your doctor's medical assistant. If no one answers, please leave a voicemail as directed and we will return your call as soon as possible. Messages left after 4 pm will be answered the following business day.   You may also send [place] a message via [place]. We typically respond to MyChart messages within 1-2 business days.  For prescription refills, please ask your pharmacy to contact our office. Our fax number is [phone].  If you have  an urgent issue when the clinic is closed that cannot wait until the next business day, you can page your doctor at the number below.    Please note that while we do our best to be available for urgent issues outside of office hours, we are not available 24/7.   If you have an urgent issue and are unable to reach [place], you may choose to seek medical care at your doctor's office, retail clinic, urgent care center, or emergency room.  If you have a medical emergency, please immediately call 911 or go to the emergency department.  Pager Numbers  - Dr. Nehemiah Massed: [phone]  - Dr. Laurence Ferrari: [phone]  - Dr. Nicole Kindred: [phone]  In the event of inclement weather, please call our main line at [phone] for an update on the status of any delays or closures.  Dermatology Medication Tips: Please keep the boxes that topical medications come in in order to help keep track of the instructions about where and how to use these. Pharmacies typically print the medication instructions only on the boxes and not directly on the medication tubes.   If your medication is too expensive, please contact our office at [phone] option 4 or send [place] a message through [place].   We are unable to tell what your co-pay for medications will be in advance as this is different depending on your insurance coverage. However, we may be able to find a substitute medication at lower cost or fill out paperwork to get insurance  to cover a needed medication.   If a prior authorization is required to get your medication covered by your insurance company, please allow [place] 1-2 business days to complete this process.  Drug prices often vary depending on where the prescription is filled and some pharmacies may offer cheaper prices.  The website www.goodrx.com contains coupons for medications through different pharmacies. The prices here do not account for what the cost may be with help from insurance (it may be cheaper with  your insurance), but the website can give you the price if you did not use any insurance.  - You can print the associated coupon and take it with your prescription to the pharmacy.  - You may also stop by our office during regular business hours and pick up a GoodRx coupon card.  - If you need your prescription sent electronically to a different pharmacy, notify our office through [facility] or by phone at [phone] option 4.

## 2021-07-05 ENCOUNTER — Encounter: Payer: Self-pay | Admitting: Dermatology

## 2021-07-07 ENCOUNTER — Other Ambulatory Visit: Payer: Self-pay | Admitting: Nurse Practitioner

## 2021-07-08 NOTE — Telephone Encounter (Signed)
Requested Prescriptions  Pending Prescriptions Disp Refills  . hydrochlorothiazide (HYDRODIURIL) 25 MG tablet [Pharmacy Med Name: HYDROCHLOROTHIAZIDE 25 MG TAB] 90 tablet 0    Sig: TAKE 1 TABLET BY MOUTH EVERY DAY     Cardiovascular: Diuretics - Thiazide Failed - 07/08/2021  7:41 AM      Failed - Cr in normal range and within 360 days    Creatinine, Ser  Date Value Ref Range Status  05/01/2021 1.84 (H) 0.76 - 1.27 mg/dL Final   Creatinine,U  Date Value Ref Range Status  02/11/2020 51.7 mg/dL Final         Failed - Last BP in normal range    BP Readings from Last 1 Encounters:  05/26/21 140/60         Passed - Ca in normal range and within 360 days    Calcium  Date Value Ref Range Status  05/01/2021 10.1 8.6 - 10.2 mg/dL Final         Passed - K in normal range and within 360 days    Potassium  Date Value Ref Range Status  05/01/2021 3.8 3.5 - 5.2 mmol/L Final         Passed - Na in normal range and within 360 days    Sodium  Date Value Ref Range Status  05/01/2021 135 134 - 144 mmol/L Final         Passed - Valid encounter within last 6 months    Recent Outpatient Visits          2 months ago Uncontrolled type II diabetes mellitus with chronic kidney disease ([place])   [place], Karen, NP   5 months ago Hypertension associated with diabetes ([place])   [place] [facility] Jon Billings, NP   6 months ago Hypertension associated with diabetes [facility] [place])   [place] Jon Billings, NP      Future Appointments            In 3 weeks Jon Billings, NP [facility], [place]   In 1 month Ralene Bathe, MD [place]

## 2021-07-28 ENCOUNTER — Ambulatory Visit (INDEPENDENT_AMBULATORY_CARE_PROVIDER_SITE_OTHER): Payer: Medicare Other | Admitting: Endocrinology

## 2021-07-28 ENCOUNTER — Encounter: Payer: Self-pay | Admitting: Endocrinology

## 2021-07-28 ENCOUNTER — Other Ambulatory Visit: Payer: Self-pay

## 2021-07-28 VITALS — BP 142/80 | HR 76 | Ht 70.0 in | Wt 298.8 lb

## 2021-07-28 DIAGNOSIS — E113592 Type 2 diabetes mellitus with proliferative diabetic retinopathy without macular edema, left eye: Secondary | ICD-10-CM | POA: Diagnosis not present

## 2021-07-28 LAB — POCT GLYCOSYLATED HEMOGLOBIN (HGB A1C): Hemoglobin A1C: 11.2 % — AB (ref 4.0–5.6)

## 2021-07-28 MED ORDER — TRULICITY 0.75 MG/0.5ML ~~LOC~~ SOAJ: 0.7500 mg | SUBCUTANEOUS | 3 refills | Status: DC

## 2021-07-28 NOTE — Patient Instructions (Addendum)
check your blood sugar twice a day.  vary the time of day when you check, between before the 3 meals, and at bedtime.  also check if you have symptoms of your blood sugar being too high or too low.  please keep a record of the readings and bring it to your next appointment here (or you can bring the meter itself).  You can write it on any piece of paper.  please call us sooner if your blood sugar goes below 70, or if most of your readings are over 200.  I have sent a prescription to your pharmacy, to add Trulicity.   Please continue the same insulin.  Please come back for a follow-up appointment in 2 months.

## 2021-07-28 NOTE — Progress Notes (Signed)
Subjective:    Patient ID: Andrew Pruitt, male    DOB: Jul 07, 1956, 65 y.o.   MRN: [id number]  HPI Pt returns for f/u of diabetes mellitus:  DM type: Insulin-requiring type 2 Dx'ed: 3818 Complications: PN, PDR, and CRI.   Therapy: insulin since 2005, and Farxiga.  DKA: never Severe hypoglycemia: never Pancreatitis: never Pancreatic imaging: never SDOH: he could not afford Ozempic or Rybelsus.  Other: he declines multiple daily injections: Lantus was changed to NPH, due to pattern of cbg's; he eats meals at 7AM, 12PM, and 8PM.   Interval history: I reviewed continuous glucose monitor data.  Glucose varies from 120-400.  It decreases 12MN-7AM, then increases to 12N.  Pt says he does not miss the insulin.   Past Medical History:  Diagnosis Date   Arthritis    Depression    Diabetes mellitus without complication ([place])    Hyperlipidemia    Hypertension     Past Surgical History:  Procedure Laterality Date   TONSILLECTOMY      Social History   Socioeconomic History   Marital status: Married    Spouse name: Not on file   Number of children: Not on file   Years of education: Not on file   Highest education level: Not on file  Occupational History   Not on file  Tobacco Use   Smoking status: Never   Smokeless tobacco: Never  Vaping Use   Vaping Use: Never used  Substance and Sexual Activity   Alcohol use: Not Currently   Drug use: Never   Sexual activity: Yes  Other Topics Concern   Not on file  Social History Narrative   Not on file   Social Determinants of Health   Financial Resource Strain: Low Risk    Difficulty of Paying Living Expenses: Not very hard  Food Insecurity: No Food Insecurity   Worried About Running Out of Food in the Last Year: Never true   [place] in the Last Year: Never true  Transportation Needs: No Transportation Needs   Lack of Transportation (Medical): No   Lack of Transportation (Non-Medical): No  Physical Activity: Inactive    Days of Exercise per Week: 0 days   Minutes of Exercise per Session: 0 min  Stress: No Stress Concern Present   Feeling of Stress : Only a little  Social Connections: Moderately Isolated   Frequency of Communication with Friends and Family: More than three times a week   Frequency of Social Gatherings with Friends and Family: More than three times a week   Attends Religious Services: Never   Marine scientist or Organizations: No   Attends Music therapist: Never   Marital Status: Married  Human resources officer Violence: Not At Risk   Fear of Current or Ex-Partner: No   Emotionally Abused: No   Physically Abused: No   Sexually Abused: No    Current Outpatient Medications on File Prior to Visit  Medication Sig Dispense Refill   amLODipine (NORVASC) 5 MG tablet TAKE 1 TABLET BY MOUTH DAILY. FURTHER REFILLS WILL NEED TO COME FROM NEW PRIMARY CARE PROVIDER 90 tablet 0   atorvastatin (LIPITOR) 80 MG tablet Take 1 tablet (80 mg total) by mouth daily. 90 tablet 3   B-D UF III MINI PEN NEEDLES 31G X 5 MM MISC USE DAILY 100 each 0   Continuous Blood Gluc Receiver (FREESTYLE LIBRE 14 DAY READER) DEVI Use to check glucose levels TID; E11.9 1 each 0  Continuous Blood Gluc Sensor (FREESTYLE LIBRE 14 DAY SENSOR) MISC Use to check glucose TID. Change every 14 days. E 11.9 6 each 3   dapagliflozin propanediol (FARXIGA) 10 MG TABS tablet Take 1 tablet (10 mg total) by mouth daily before breakfast. 90 tablet 1   diclofenac Sodium (VOLTAREN) 1 % GEL Apply 2 g topically 4 (four) times daily. 100 g 1   fenofibrate (TRICOR) 145 MG tablet Take 1 tablet (145 mg total) by mouth daily. 90 tablet 1   gabapentin (NEURONTIN) 100 MG capsule Take 1 capsule (100 mg total) by mouth 3 (three) times daily. 90 capsule 0   hydrochlorothiazide (HYDRODIURIL) 25 MG tablet TAKE 1 TABLET BY MOUTH EVERY DAY 90 tablet 0   insulin NPH Human (HUMULIN N) 100 UNIT/ML injection Inject 2 mLs (200 Units total) into the  skin in the morning. 200 mL 3   No current facility-administered medications on file prior to visit.    Allergies  Allergen Reactions   Niaspan [Niacin] Other (See Comments)    Red skin, red eyes    Family History  Problem Relation Age of Onset   Cancer Mother        Uterine    Early death Mother    Stroke Mother    Diabetes Father    Heart attack Father    Heart disease Father    Hyperlipidemia Father    Diabetes Brother    Alzheimer's disease Maternal Grandmother    Kidney failure Maternal Grandfather    Diabetes Paternal Grandfather    Diabetes Brother    Diabetes Brother    Colon cancer Neg Hx    Pancreatic cancer Neg Hx    Esophageal cancer Neg Hx     BP (!) 142/80 (BP Location: Right Arm, Patient Position: Sitting, Cuff Size: Large)   Pulse 76   Ht 5\' 10"  (1.778 m)   Wt 298 lb 12.8 oz (135.5 kg)   SpO2 96%   BMI 42.87 kg/m    Review of Systems     Objective:   Physical Exam    Lab Results  Component Value Date   HGBA1C 11.2 (A) 07/28/2021      Assessment & Plan:  Insulin-requiring type 2 DM: uncontrolled.  We discussed.  We decided to re-try GLP rx  Patient Instructions  check your blood sugar twice a day.  vary the time of day when you check, between before the 3 meals, and at bedtime.  also check if you have symptoms of your blood sugar being too high or too low.  please keep a record of the readings and bring it to your next appointment here (or you can bring the meter itself).  You can write it on any piece of paper.  please call us sooner if your blood sugar goes below 70, or if most of your readings are over 200.  I have sent a prescription to your pharmacy, to add Trulicity.   Please continue the same insulin.  Please come back for a follow-up appointment in 2 months.

## 2021-07-31 ENCOUNTER — Telehealth: Payer: Self-pay | Admitting: Endocrinology

## 2021-07-31 ENCOUNTER — Ambulatory Visit (INDEPENDENT_AMBULATORY_CARE_PROVIDER_SITE_OTHER): Payer: Medicare Other

## 2021-07-31 ENCOUNTER — Telehealth: Payer: Self-pay

## 2021-07-31 DIAGNOSIS — F5101 Primary insomnia: Secondary | ICD-10-CM

## 2021-07-31 DIAGNOSIS — E1169 Type 2 diabetes mellitus with other specified complication: Secondary | ICD-10-CM

## 2021-07-31 DIAGNOSIS — E1140 Type 2 diabetes mellitus with diabetic neuropathy, unspecified: Secondary | ICD-10-CM

## 2021-07-31 DIAGNOSIS — Z794 Long term (current) use of insulin: Secondary | ICD-10-CM

## 2021-07-31 DIAGNOSIS — F339 Major depressive disorder, recurrent, unspecified: Secondary | ICD-10-CM

## 2021-07-31 DIAGNOSIS — I152 Hypertension secondary to endocrine disorders: Secondary | ICD-10-CM

## 2021-07-31 DIAGNOSIS — E1159 Type 2 diabetes mellitus with other circulatory complications: Secondary | ICD-10-CM

## 2021-07-31 NOTE — Chronic Care Management (AMB) (Signed)
Chronic Care Management   CCM RN Visit Note  07/31/2021 Name: Andrew Pruitt MRN: [id number] DOB: 03-Oct-1955  Subjective: Andrew Pruitt is a 65 y.o. year old male who is a primary care patient of Jon Billings, NP. The care management team was consulted for assistance with disease management and care coordination needs.    Engaged with patient by telephone for follow up visit in response to provider referral for case management and/or care coordination services.   Consent to Services:  The patient was given information about Chronic Care Management services, agreed to services, and gave verbal consent prior to initiation of services.  Please see initial visit note for detailed documentation.   Patient agreed to services and verbal consent obtained.   Assessment: Review of patient past medical history, allergies, medications, health status, including review of consultants reports, laboratory and other test data, was performed as part of comprehensive evaluation and provision of chronic care management services.   SDOH (Social Determinants of Health) assessments and interventions performed:    CCM Care Plan  Allergies  Allergen Reactions   Niaspan [Niacin] Other (See Comments)    Red skin, red eyes    Outpatient Encounter Medications as of 07/31/2021  Medication Sig   amLODipine (NORVASC) 5 MG tablet TAKE 1 TABLET BY MOUTH DAILY. FURTHER REFILLS WILL NEED TO COME FROM NEW PRIMARY CARE PROVIDER   atorvastatin (LIPITOR) 80 MG tablet Take 1 tablet (80 mg total) by mouth daily.   B-D UF III MINI PEN NEEDLES 31G X 5 MM MISC USE DAILY   Continuous Blood Gluc Receiver (FREESTYLE LIBRE 14 DAY READER) DEVI Use to check glucose levels TID; E11.9   Continuous Blood Gluc Sensor (FREESTYLE LIBRE 14 DAY SENSOR) MISC Use to check glucose TID. Change every 14 days. E 11.9   dapagliflozin propanediol (FARXIGA) 10 MG TABS tablet Take 1 tablet (10 mg total) by mouth daily before breakfast.    diclofenac Sodium (VOLTAREN) 1 % GEL Apply 2 g topically 4 (four) times daily.   Dulaglutide (TRULICITY) 8.67 YP/9.5KD SOPN Inject 0.75 mg into the skin once a week.   fenofibrate (TRICOR) 145 MG tablet Take 1 tablet (145 mg total) by mouth daily.   gabapentin (NEURONTIN) 100 MG capsule Take 1 capsule (100 mg total) by mouth 3 (three) times daily.   hydrochlorothiazide (HYDRODIURIL) 25 MG tablet TAKE 1 TABLET BY MOUTH EVERY DAY   insulin NPH Human (HUMULIN N) 100 UNIT/ML injection Inject 2 mLs (200 Units total) into the skin in the morning.   No facility-administered encounter medications on file as of 07/31/2021.    Patient Active Problem List   Diagnosis Date Noted   Nuclear sclerotic cataract of both eyes 09/07/2020   [place]hoe retinal tear, right eye 08/11/2020   Viral warts 06/07/2020   Diabetic macular edema of right eye with proliferative retinopathy associated with type 2 diabetes mellitus ([place]) 04/20/2020   Proliferative diabetic retinopathy of right eye ([place]) 03/02/2020   Type 2 diabetes mellitus with proliferative diabetic retinopathy of left eye without macular edema ([place]) 03/02/2020   Diabetic neuropathy associated with type 2 diabetes mellitus ([place]) 02/22/2020   Type 2 diabetes mellitus ([place]) 02/22/2020   Hypertension associated with diabetes ([place]) 02/22/2020   Hyperlipidemia associated with type 2 diabetes mellitus ([place]) 02/22/2020   Prostate cancer screening 02/22/2020   Plantar warts 02/22/2020    Conditions to be addressed/monitored:HTN, HLD, DMII, Depression, and Insomnia   Care Plan : RNCM: Diabetes Type 2 (Adult)  Updates made by Hall Busing,  Nobie Putnam, RN since 07/31/2021 12:00 AM  Completed 07/31/2021   Problem: RNCM: Glycemic Management (Diabetes, Type 2) Resolved 07/31/2021  Priority: High     Long-Range Goal: RNCM: Glycemic Management Optimized Completed 07/31/2021  Start Date: 02/07/2021  Expected End Date: 03/23/2022  Recent Progress: Not on track  Priority:  High  Note:   Objective: Resolving, duplicate goal Lab Results  Component Value Date   HGBA1C 11.6 (A) 05/26/2021   HGBA1C 01-24-2021 11.8 Lab Results  Component Value Date   CREATININE 1.84 (H) 05/01/2021   CREATININE 1.68 (H) 04/05/2021   CREATININE 1.55 (H) 02/03/2021   Lab Results  Component Value Date   EGFR 40 (L) 05/01/2021   Current Barriers:  Knowledge Deficits related to basic Diabetes pathophysiology and self care/management Knowledge Deficits related to medications used for management of diabetes Difficulty obtaining or cannot afford medications Financial Constraints Unable to independently manage DM as evidence of hemoglobin A1C of 11.8 on 01-24-2021. 03-28-2021: Endocrinologist office A1C was 12.3, 05-26-2021 A1C of 11.6 Unable to self administer medications as prescribed- unable to afford Ozempic. 03-28-2021: Makes too much money to get assistance with Trulicity. Has been approved for Farxiga through [place] and me. Will receive this through the mail.  Does not adhere to prescribed medication regimen Case Manager Clinical Goal(s):  patient will demonstrate improved adherence to prescribed treatment plan for diabetes self care/management as evidenced by: daily monitoring and recording of CBG  adherence to ADA/ carb modified diet exercise 5 days/week adherence to prescribed medication regimen contacting provider for new or worsened symptoms or questions work with pharm D to see if there are programs to help with affording Ozempic- appointment 02-15-2021 Interventions:  Collaboration with Jon Billings, NP regarding development and update of comprehensive plan of care as evidenced by provider attestation and co-signature Inter-disciplinary care team collaboration (see longitudinal plan of care) Provided education to patient about basic DM disease process. Will send information by the EMMI system and My Chart on dietary restrictions for DM and heart healthy diet. As well as exercise  and pacing activity  Reviewed medications with patient and discussed importance of medication adherence. The patient can not afford the Ozempic. States the endocrinologist has upped his insulin up to 150 units a day and he has no energy and can not do exercise. He has gained 40 pounds. His usual weight is 260 and he weights 300 pounds and is 6'0". Appointment with pharm D to help with cost constraints of Ozempic on 02-15-2021. 03-28-2021: Was not approved for help with Ozempic or Trulicity, does for [place] and will receive this in the mail. Education on the importance of taking as prescribed. 05-30-2021: The patient states the endocrinologist raised his insulin to 200 units every morning. No other changes in medications at this time. Will see the endocrinologist in 2 months for follow up.  Patient states compliance with medications at this time.  Discussed plans with patient for ongoing care management follow up and provided patient with direct contact information for care management team Provided patient with written educational materials related to hypo and hyperglycemia and importance of correct treatment. Review of fasting blood sugars or <130 and post prandial of <180. The patient states his range in am is around 80 to 88 and other times 186 to 200. Education on goal of getting A1C down considerably. 05-30-2021:The patient states that he knows he needs to get his blood sugars down. Had lost weight but has gained most of it back. Will continue to monitor.  Reviewed scheduled/upcoming provider appointments including: 08-01-2021 at 0900 am Advised patient, providing education and rationale, to check cbg tid and record, calling pcp/endocrinologist  for findings outside established parameters.   Referral made to pharmacy team for assistance with cost of Ozempic and the patients cost constraints.  Review of patient status, including review of consultants reports, relevant laboratory and other test results, and  medications completed. Self-Care Activities - UNABLE to independently manage DM Self administers oral medications as prescribed Self administers insulin as prescribed Attends all scheduled provider appointments Checks blood sugars as prescribed and utilize hyper and hypoglycemia protocol as needed Adheres to prescribed ADA/carb modified Patient Goals: - check blood sugar at prescribed times - check blood sugar before and after exercise - check blood sugar if I feel it is too high or too low - enter blood sugar readings and medication or insulin into daily log - take the blood sugar log to all doctor visits - change to whole grain breads, cereal, pasta - set goal weight - drink 6 to 8 glasses of water each day - eat fish at least once per week - fill half of plate with vegetables - limit fast food meals to no more than 1 per week - manage portion size - prepare main meal at home 3 to 5 days each week - read food labels for fat, fiber, carbohydrates and portion size - set a realistic goal - schedule appointment with eye doctor - check feet daily for cuts, sores or redness - keep feet up while sitting - trim toenails straight across - wash and dry feet carefully every day - wear comfortable, cotton socks - wear comfortable, well-fitting shoes - barriers to adherence to treatment plan identified - blood glucose readings reviewed - individualized medical nutrition therapy provided - mutual A1C goal set or reviewed - resources required to improve adherence to care identified - self-awareness of signs/symptoms of hypo or hyperglycemia encouraged - use of blood glucose monitoring log promoted Follow Up Plan: Face to Face appointment with care management team member scheduled for:  08-01-2021 at 0900 am when the patient sees the pcp in the office. Will continue to monitor.       Care Plan : RNCM: Hypertension (Adult)  Updates made by Vanita Ingles, RN since 07/31/2021 12:00 AM   Completed 07/31/2021   Problem: RNCM: Hypertension (Hypertension) Resolved 07/31/2021  Priority: Medium     Long-Range Goal: RNCM: Hypertension Monitored Completed 07/31/2021  Start Date: 02/07/2021  Expected End Date: 03/23/2022  Recent Progress: On track  Priority: High  Note:   Objective: Resolving, duplicate goal Last practice recorded BP readings:  BP Readings from Last 3 Encounters:  05/26/21 140/60  05/01/21 (!) 152/85  04/05/21 (!) 153/93    Most recent eGFR/CrCl:  Lab Results  Component Value Date   EGFR 40 (L) 05/01/2021    No components found for: CRCL Current Barriers:  Knowledge Deficits related to basic understanding of hypertension pathophysiology and self care management Knowledge Deficits related to understanding of medications prescribed for management of hypertension Financial Constraints.  Unable to independently manage HTN Does not maintain contact with provider office Does not contact provider office for questions/concerns Case Manager Clinical Goal(s):  patient will verbalize understanding of plan for hypertension management patient will attend all scheduled medical appointments: 05-01-2021 at 0900 am patient will demonstrate improved adherence to prescribed treatment plan for hypertension as evidenced by taking all medications as prescribed, monitoring and recording blood pressure as directed, adhering to low  sodium/DASH diet patient will demonstrate improved health management independence as evidenced by checking blood pressure as directed and notifying PCP if SBP>150 or DBP > 90, taking all medications as prescribe, and adhering to a low sodium diet as discussed. patient will verbalize basic understanding of hypertension disease process and self health management plan as evidenced by compliance with Heart healthy/ADA diet, compliance with medications regiment, and working with the CCM team to effectively manage health and well being.  Interventions:   Collaboration with Jon Billings, NP regarding development and update of comprehensive plan of care as evidenced by provider attestation and co-signature Inter-disciplinary care team collaboration (see longitudinal plan of care) Evaluation of current treatment plan related to hypertension self management and patient's adherence to plan as established by provider. 03-28-2021: The patients blood pressure was elevated at the endocrinologist office on 03-09-2021. The patient states that his blood pressure fluctuates at times. Denies any headaches. Review of systolic 621 or less and diastolic 90 or less. Education and support given. 05-30-2021: The patient is having better blood pressure readings since changes in amlodipine dose. The patient denies any issues with HTN. The patient states he is still having a cough and his wife states it is more like a "bark" that he is having that starts when he is eating. He states it is worse than usual. States that he does take the benadryl and that is helpful but it causes him to want to sleep all day. The patient states that he just would like to know what is causing him to have this cough. Review of medications, allergens and other things that may contribute to a cough. Denies any acute distress. Will continue to monitor for changes.  Provided education to patient re: stroke prevention, s/s of heart attack and stroke, DASH diet, complications of uncontrolled blood pressure. 03-28-2021: Review of heart healthy/ADA diet and the risk of heart attack and stroke with uncontrolled blood pressures. The patient is complaining of a cough like he had one time when he was on Lisinopril. The patient does not take ACE inhibitors. The patient states this has been going on for about 2 months now and it is aggravating to him and his wife. He said its like a piece of pop corn in stuck in his throat. Reviewed medications for medications with cough side effects, none detected. Will collaborate  with the pcp and see what recommendations she has. Discussed allergies, changes in detergent, other factors that may cause a cough. The patient denies any underlying factors. 05-30-2021: Continues to have the cough and states it is aggravating. Has discussed with pcp. Will collaborate with the pcp for recommendations. Will continue to monitor.  Reviewed medications with patient and discussed importance of compliance. 05-30-2021: The patient is compliant with medications he can afford. Denies any issues with medications.  Discussed plans with patient for ongoing care management follow up and provided patient with direct contact information for care management team Advised patient, providing education and rationale, to monitor blood pressure daily and record, calling PCP for findings outside established parameters.  Reviewed scheduled/upcoming provider appointments including: 08-01-2021 at 0900 am Self-Care Activities: - Self administers medications as prescribed Attends all scheduled provider appointments Calls provider office for new concerns, questions, or BP outside discussed parameters Checks BP and records as discussed Follows a low sodium diet/DASH diet Patient Goals: - check blood pressure weekly - choose a place to take my blood pressure (home, clinic or office, retail store) - write blood pressure results in a  log or diary - agree on reward when goals are met - agree to work together to make changes - ask questions to understand - learn about high blood pressure - blood pressure trends reviewed - depression screen reviewed - home or ambulatory blood pressure monitoring encouraged Follow Up Plan: Face to Face appointment with care management team member scheduled for:  08-01-2021 at 0900 am, the patient in the office to see the pcp for follow up.     Care Plan : RNCM: HLD Management  Updates made by Vanita Ingles, RN since 07/31/2021 12:00 AM  Completed 07/31/2021   Problem: RNCM;  HLD management Resolved 07/31/2021  Priority: High     Long-Range Goal: Self-Management Plan Developed Completed 07/31/2021  Start Date: 02/07/2021  Expected End Date: 03/23/2022  Recent Progress: Not on track  Priority: High  Note:   Current Barriers: Resolving, duplicate goal Poorly controlled hyperlipidemia, complicated by DM, low energy level, insomnia Current antihyperlipidemic regimen: Atorvastatin 81m and Fenofibrate 145 mg daily Most recent lipid panel:     Component Value Date/Time   CHOL 220 (H) 05/01/2021 0930   TRIG 633 (HH) 05/01/2021 0930   HDL 24 (L) 05/01/2021 0930   CHOLHDL 9.2 (H) 05/01/2021 0930   CHOLHDL 12 02/11/2020 1037   LDLCALC 91 05/01/2021 0930   LDLDIRECT 81.0 02/11/2020 1037   ASCVD risk enhancing conditions: age >>50 DM, HTN Unable to independently manage HLD as evidence of abnormal labs Picky eater, does not like vegetables  RN Care Manager Clinical Goal(s):  patient will work with RConsulting civil engineer providers, and care team towards execution of optimized self-health management plan patient will verbalize understanding of plan for effective management of HLD  [facility] and pcp  to address needs related to effective management of HLD  patient will attend all scheduled medical appointments: 05-01-2021 at 0900 am patient will demonstrate improved adherence to prescribed treatment plan for HLD as evidenced bypostivie response to medications regimen and lab work trending toward normal limits.  Interventions: Collaboration with HJon Billings NP regarding development and update of comprehensive plan of care as evidenced by provider attestation and co-signature Inter-disciplinary care team collaboration (see longitudinal plan of care) Medication review performed; medication list updated in electronic medical record. The patient has had changes in his medications regimen for HLD. He is taking the atorvastatin 80 mg and fenofibrate  145 mg daily without difficulty. No cost constraints with HLD medications regimen. 05-30-2021: The patient with high levels. States compliance with medications.  Inter-disciplinary care team collaboration (see longitudinal plan of care) Referred to pharmacy team for assistance with HLD medication management- has an appointment on 02-15-2021 with the pharmacist for medications reconciliation and concerns  Evaluation of current treatment plan related to HLD and patient's adherence to plan as established by provider. 05-30-2021: The patient states he is monitoring his dietary intake and has made positive changes. Denies any issues with his cholesterol at this time. Encouraged active lifestyle. Will continue to monitor.  Advised patient to work on dietary changes, call the office for changes or questions.  Provided education to patient re: heart healthy/ADA diet, review of food choices due to the patients picky eating habits. Review of exercise routine. The patient states he can not exercise longer than 10 minutes due to his energy level and not being able to do any more activity after that. 05-30-2021: Review of heart healthy/ADA diet Reviewed medications with patient and discussed compliance. States compliance with medications for  HLD  Reviewed scheduled/upcoming provider appointments including: 08-01-2021 at 0900 am Discussed plans with patient for ongoing care management follow up and provided patient with direct contact information for care management team Patient Goals/Self-Care Activities: - call for medicine refill 2 or 3 days before it runs out - call if I am sick and can't take my medicine - keep a list of all the medicines I take; vitamins and herbals too - learn to read medicine labels - use a pillbox to sort medicine - use an alarm clock or phone to remind me to take my medicine - change to whole grain breads, cereal, pasta - drink 6 to 8 glasses of water each day - eat 3 to 5 servings of  fruits and vegetables each day - eat 5 or 6 small meals each day - eat fish at least once per week - fill half the plate with nonstarchy vegetables - limit fast food meals to no more than 1 per week - manage portion size - prepare main meal at home 3 to 5 days each week - read food labels for fat, fiber, carbohydrates and portion size - be open to making changes - I can manage, know and watch for signs of a heart attack - if I have chest pain, call for help - learn about small changes that will make a big difference - learn my personal risk factors - barriers to meeting goals identified - change-talk evoked - choices provided - collaboration with team encouraged - decision-making supported - health risks reviewed - problem-solving facilitated - questions answered - readiness for change evaluated - reassurance provided - self-reflection promoted - self-reliance encouraged - verbalization of feelings encouraged  Follow Up Plan: Face to Face appointment with care management team member scheduled for:  08-01-2021 at 0900 am      Care Plan : RNCM: General Plan of Care (Adult) for Chronic Disease Management and Care Coordination Needs  Updates made by Vanita Ingles, RN since 07/31/2021 12:00 AM     Problem: RNCM: Development of Plan of Care for Chronic Disease Management(DM, HTN, HLD, Depression, Insomnia)   Priority: High     Long-Range Goal: RNCM: Effective Management  of Plan of Care for Chronic Disease Management(DM, HTN, HLD, Depression, Insomnia)   Start Date: 07/31/2021  Expected End Date: 07/31/2022  Priority: High  Note:   Current Barriers:  Knowledge Deficits related to plan of care for management of HTN, HLD, DMII, and Depression: depressed mood anxiety insomnia  Chronic Disease Management support and education needs related to HTN, HLD, DMII, and Depression: depressed mood anxiety insomnia Financial Constraints.   RNCM Clinical Goal(s):  Patient will  verbalize basic understanding of HTN, HLD, DMII, Depression, and insomnia  disease process and self health management plan as evidenced by compliance with the plan of care, following dietary restrictions, and working with CCM team to effectively manage health and well being  take all medications exactly as prescribed and will call provider for medication related questions as evidenced by taking medications as prescribed and calling for refills before running out of medications     attend all scheduled medical appointments: 08-01-2021 as evidenced by keeping appointments and calling the office for needed appointment changes         demonstrate improved and ongoing health management independence as evidenced by compliance with plan of care, calling the office for changes, and working with the CCM team to optimize health and well being.  demonstrate a decrease in HTN, HLD, DMII, Depression, and insomnia  exacerbations  as evidenced by discussing concerns with the provider and working with the CCM team for ongoing support and education demonstrate ongoing self health care management ability for effective management of chronic conditions  as evidenced by  working with the CCM team through collaboration with Consulting civil engineer, provider, and care team.   Interventions: 1:1 collaboration with primary care provider regarding development and update of comprehensive plan of care as evidenced by provider attestation and co-signature Inter-disciplinary care team collaboration (see longitudinal plan of care) Evaluation of current treatment plan related to  self management and patient's adherence to plan as established by provider   Diabetes:  (Status: Goal on Track (progressing): YES.) Long Term Goal   Lab Results  Component Value Date   HGBA1C 11.2 (A) 07/28/2021  Assessed patient's understanding of A1c goal: <7% Provided education to patient about basic DM disease process; Reviewed medications with  patient and discussed importance of medication adherence. 07-31-2021: Is working with the endocrinologist office to see about getting assistance with medications to help control his DM.;        Reviewed prescribed diet with patient heart healthy/ADA diet ; Counseled on importance of regular laboratory monitoring as prescribed;        Discussed plans with patient for ongoing care management follow up and provided patient with direct contact information for care management team;      Provided patient with written educational materials related to hypo and hyperglycemia and importance of correct treatment;       Reviewed scheduled/upcoming provider appointments including: 08-01-2021 with the pcp, saw endocrinologist recently ;         Advised patient, providing education and rationale, to check cbg before meals and at bedtime, when you have symptoms of low or high blood sugar, and before and after exercise and record        call provider for findings outside established parameters;       Referral made to pharmacy team for assistance with medications cost constraints and management ;       Review of patient status, including review of consultants reports, relevant laboratory and other test results, and medications completed;        Depression and Insomnia  (Status: Goal on Track (progressing): YES.) Long Term Goal  Evaluation of current treatment plan related to Depression and Insomnia  , Mental Health Concerns  self-management and patient's adherence to plan as established by provider. Discussed plans with patient for ongoing care management follow up and provided patient with direct contact information for care management team Advised patient to discuss his depression and insomnia with the pcp tomorrow. The patient states that his wife says he is depressed. He says he is only sleeping about 3 hours a night. ; Provided education to patient re: sleep hygiene, healthy sleep habits, pacing  activity; Reviewed scheduled/upcoming provider appointments including 08-01-2021 with the pcp ; Discussed plans with patient for ongoing care management follow up and provided patient with direct contact information for care management team; Advised patient to discuss medication options that would help with sleep with provider; Screening for signs and symptoms of depression related to chronic disease state;  Assessed social determinant of health barriers;   Hyperlipidemia:  (Status: Goal on Track (progressing): YES.) Long Term Goal  Lab Results  Component Value Date   CHOL 220 (H) 05/01/2021   HDL 24 (L) 05/01/2021   LDLCALC 91 05/01/2021   LDLDIRECT  81.0 02/11/2020   TRIG 633 (HH) 05/01/2021   CHOLHDL 9.2 (H) 05/01/2021     Medication review performed; medication list updated in electronic medical record.  Provider established cholesterol goals reviewed; Counseled on importance of regular laboratory monitoring as prescribed; Provided HLD educational materials; Reviewed role and benefits of statin for ASCVD risk reduction; Discussed strategies to manage statin-induced myalgias; Reviewed importance of limiting foods high in cholesterol; Reviewed exercise goals and target of 150 minutes per week;  Hypertension: (Status: Goal on Track (progressing): YES. Goal Not Met.) Last practice recorded BP readings:  BP Readings from Last 3 Encounters:  07/28/21 (!) 142/80  05/26/21 140/60  05/01/21 (!) 152/85  Most recent eGFR/CrCl:  Lab Results  Component Value Date   EGFR 40 (L) 05/01/2021    No components found for: CRCL  Evaluation of current treatment plan related to hypertension self management and patient's adherence to plan as established by provider;   Provided education to patient re: stroke prevention, s/s of heart attack and stroke; Reviewed prescribed diet heart healthy/ADA diet  Reviewed medications with patient and discussed importance of compliance;  Discussed plans with  patient for ongoing care management follow up and provided patient with direct contact information for care management team; Advised patient, providing education and rationale, to monitor blood pressure daily and record, calling PCP for findings outside established parameters;  Advised patient to discuss blood pressure trends  with provider; Provided education on prescribed diet heart healthy/ADA diet ;  Discussed complications of poorly controlled blood pressure such as heart disease, stroke, circulatory complications, vision complications, kidney impairment, sexual dysfunction;   Patient Goals/Self-Care Activities: Take medications as prescribed   Attend all scheduled provider appointments Call pharmacy for medication refills 3-7 days in advance of running out of medications Attend church or other social activities Perform all self care activities independently  Perform IADL's (shopping, preparing meals, housekeeping, managing finances) independently Call provider office for new concerns or questions  Work with the social worker to address care coordination needs and will continue to work with the clinical team to address health care and disease management related needs call 911 call the Suicide and Crisis Lifeline: 988 call the [place] National Suicide Prevention Lifeline: 480-301-5204 call 1-800-273-TALK (toll free, 24 hour hotline) if experiencing a Mental Health or War  keep appointment with eye doctor check blood sugar at prescribed times: before meals and at bedtime, when you have symptoms of low or high blood sugar, and before and after exercise check feet daily for cuts, sores or redness enter blood sugar readings and medication or insulin into daily log take the blood sugar log to all doctor visits trim toenails straight across drink 6 to 8 glasses of water each day eat fish at least once per week fill half of plate with vegetables limit fast food meals to no  more than 1 per week manage portion size prepare main meal at home 3 to 5 days each week read food labels for fat, fiber, carbohydrates and portion size reduce red meat to 2 to 3 times a week set a realistic goal do heel pump exercise 2 to 3 times each day keep feet up while sitting wash and dry feet carefully every day wear comfortable, cotton socks wear comfortable, well-fitting shoes check blood pressure weekly choose a place to take my blood pressure (home, clinic or office, retail store) learn about high blood pressure take blood pressure log to all doctor appointments call doctor for signs and symptoms  of high blood pressure develop an action plan for high blood pressure keep all doctor appointments take medications for blood pressure exactly as prescribed report new symptoms to your doctor eat more whole grains, fruits and vegetables, lean meats and healthy fats - call for medicine refill 2 or 3 days before it runs out - take all medications exactly as prescribed - call doctor with any symptoms you believe are related to your medicine - call doctor when you experience any new symptoms - go to all doctor appointments as scheduled - adhere to prescribed diet: heart healthy/ADA diet        Plan:Telephone follow up appointment with care management team member scheduled for:  09-26-2021 at 4 pm  [place], MSN, [facility] Mobile: [phone]

## 2021-07-31 NOTE — Telephone Encounter (Signed)
Spoke with pt's wife and advised them that they will need to print the [facility] application  off and fill it out to its entirety along with a copy of there income and ID and drop it off to the off and [place] and I will complete the rest and it will be faxed out.

## 2021-07-31 NOTE — Progress Notes (Signed)
BP 127/75   Pulse 78   Temp (!) 97.4 F (36.3 C) (Oral)   Ht '5\' 11"'  (1.803 m)   Wt 295 lb 12.8 oz (134.2 kg)   SpO2 98%   BMI 41.26 kg/m    Subjective:    Patient ID: Andrew Pruitt, male    DOB: 09/18/1955, 65 y.o.   MRN: [id number]  HPI: Andrew Pruitt is a 65 y.o. male  Chief Complaint  Patient presents with   Diabetes   Hyperlipidemia   Hypertension   HYPERTENSION / HYPERLIPIDEMIA Satisfied with current treatment? yes Duration of hypertension: years BP monitoring frequency: not checking BP range:  BP medication side effects: no Past BP meds: amlodipine and HCTZ Duration of hyperlipidemia: years Cholesterol medication side effects: no Cholesterol supplements: none Past cholesterol medications: atorvastain (lipitor) Medication compliance: excellent compliance Aspirin: no Recent stressors: no Recurrent headaches: no Visual changes: no Palpitations: no Dyspnea: no Chest pain: no Lower extremity edema: no Dizzy/lightheaded: no  DIABETES Hypoglycemic episodes:no Polydipsia/polyuria: no Visual disturbance: no Chest pain: no Paresthesias: no Glucose Monitoring: no  Accucheck frequency: Daily  Fasting glucose: 250s  Post prandial:  Evening:  Before meals: Taking Insulin?: yes  Long acting insulin: 200 Novolog   Short acting insulin: Blood Pressure Monitoring: not checking Retinal Examination: Up to Date Foot Exam: Up to Date Diabetic Education: Not Completed Pneumovax: Up to Date Influenza: Up to Date Aspirin: no  CHRONIC KIDNEY DISEASE CKD status: controlled Medications renally dose: yes Previous renal evaluation: no Pneumovax:  Up to Date Influenza Vaccine:  Up to Date   SLEEP DISTURBANCE Patient states he sleeps for about 3 hours then wakes up and is up for a little while then goes back to sleep and is in a continuous cycle.  Patient states he tried benadryl but it didn't change his sleep cycle.   MOOD Patient states he is more depressed  because his daughter wants to move again.  Patient states he has not tried any medications in the past.  Denies SI.  Relevant past medical, surgical, family and social history reviewed and updated as indicated. Interim medical history since our last visit reviewed. Allergies and medications reviewed and updated.  Review of Systems  Eyes:  Negative for visual disturbance.  Respiratory:  Negative for chest tightness and shortness of breath.   Cardiovascular:  Negative for chest pain, palpitations and leg swelling.  Endocrine: Negative for polydipsia and polyuria.  Neurological:  Negative for dizziness, light-headedness, numbness and headaches.  Psychiatric/Behavioral:  Positive for dysphoric mood and sleep disturbance. Negative for suicidal ideas. The patient is not nervous/anxious.    Per HPI unless specifically indicated above     Objective:    BP 127/75   Pulse 78   Temp (!) 97.4 F (36.3 C) (Oral)   Ht '5\' 11"'  (1.803 m)   Wt 295 lb 12.8 oz (134.2 kg)   SpO2 98%   BMI 41.26 kg/m   Wt Readings from Last 3 Encounters:  08/01/21 295 lb 12.8 oz (134.2 kg)  07/28/21 298 lb 12.8 oz (135.5 kg)  05/26/21 292 lb 6.4 oz (132.6 kg)    Physical Exam Vitals and nursing note reviewed.  Constitutional:      General: He is not in acute distress.    Appearance: Normal appearance. He is obese. He is not ill-appearing, toxic-appearing or diaphoretic.  HENT:     Head: Normocephalic.     Right Ear: External ear normal.     Left Ear: External ear normal.  Nose: Nose normal. No congestion or rhinorrhea.     Mouth/Throat:     Mouth: Mucous membranes are moist.  Eyes:     General:        Right eye: No discharge.        Left eye: No discharge.     Extraocular Movements: Extraocular movements intact.     Conjunctiva/sclera: Conjunctivae normal.     Pupils: Pupils are equal, round, and reactive to light.  Cardiovascular:     Rate and Rhythm: Normal rate and regular rhythm.     Heart  sounds: No murmur heard. Pulmonary:     Effort: Pulmonary effort is normal. No respiratory distress.     Breath sounds: Normal breath sounds. No wheezing, rhonchi or rales.  Abdominal:     General: Abdomen is flat. Bowel sounds are normal.  Musculoskeletal:     Cervical back: Normal range of motion and neck supple.  Skin:    General: Skin is warm and dry.     Capillary Refill: Capillary refill takes less than 2 seconds.  Neurological:     General: No focal deficit present.     Mental Status: He is alert and oriented to person, place, and time.  Psychiatric:        Mood and Affect: Mood normal.        Behavior: Behavior normal.        Thought Content: Thought content normal.        Judgment: Judgment normal.    Results for orders placed or performed in visit on 07/28/21  POCT glycosylated hemoglobin (Hb A1C)  Result Value Ref Range   Hemoglobin A1C 11.2 (A) 4.0 - 5.6 %   HbA1c POC (<> result, manual entry)     HbA1c, POC (prediabetic range)     HbA1c, POC (controlled diabetic range)        Assessment & Plan:   Problem List Items Addressed This Visit       Cardiovascular and Mediastinum   Hypertension associated with diabetes ([place]) - Primary    Chronic.  Controlled.  Continue with current medication regimen Amlodipine 76m and HCTZ 277m  Labs ordered today.  Return to clinic in 3 months for reevaluation.  Call sooner if concerns arise.        Relevant Medications   insulin aspart protamine- aspart (NOVOLOG MIX 70/30) (70-30) 100 UNIT/ML injection   Other Relevant Orders   Comp Met (CMET)     Endocrine   Diabetic neuropathy associated with type 2 diabetes mellitus (HCC)    Chronic.  Controlled.  Continue with current medication regimen of Gabapentin 10028mID.  Labs ordered today.  Return to clinic in 3 months for reevaluation.  Call sooner if concerns arise.        Relevant Medications   insulin aspart protamine- aspart (NOVOLOG MIX 70/30) (70-30) 100 UNIT/ML  injection   Type 2 diabetes mellitus (HCC)    Chronic.  Uncontrolled.  Continue with current medication regimen of Novolog 200u daily.  See's Endocrinology.  He is trying to get Patient Assistance for Trulicity.  Last A1c on 07/28/21 was 11.8.  Return to clinic in 3 months for reevaluation.  Call sooner if concerns arise.        Relevant Medications   insulin aspart protamine- aspart (NOVOLOG MIX 70/30) (70-30) 100 UNIT/ML injection   Other Relevant Orders   Lipid Profile   HgB A1c   Microalbumin, Urine Waived   Lipid Profile   Hyperlipidemia associated with  type 2 diabetes mellitus (HCC)    Chronic.  Controlled.  Continue with current medication regimen of Atorvastatin 11m daily.  Labs ordered today.  Return to clinic in 3 months for reevaluation.  Call sooner if concerns arise.        Relevant Medications   insulin aspart protamine- aspart (NOVOLOG MIX 70/30) (70-30) 100 UNIT/ML injection   Proliferative diabetic retinopathy of right eye ([place]    Followed by Ophthalmology. Up to date on eye exam.  Continue to follow their recommendations.      Relevant Medications   insulin aspart protamine- aspart (NOVOLOG MIX 70/30) (70-30) 100 UNIT/ML injection   Type 2 diabetes mellitus with proliferative diabetic retinopathy of left eye without macular edema ([place]    Followed by Ophthalmology. Up to date on eye exam.  Continue to follow their recommendations.      Relevant Medications   insulin aspart protamine- aspart (NOVOLOG MIX 70/30) (70-30) 100 UNIT/ML injection   Other Relevant Orders   Lipid Profile   HgB A1c   Microalbumin, Urine Waived   Diabetic macular edema of right eye with proliferative retinopathy associated with type 2 diabetes mellitus (HCC)    Chronic.  Controlled.  Continue with current medication regimen.  Labs ordered today.  Return to clinic in 3 months for reevaluation.  Call sooner if concerns arise.        Relevant Medications   insulin aspart protamine-  aspart (NOVOLOG MIX 70/30) (70-30) 100 UNIT/ML injection   Other Visit Diagnoses     Depression, recurrent ([place]       Will start Effexor 37.mg daily. Side effects and benefits of medication discussed during visit.  Follow up in 1 month for reevaluation.   Relevant Medications   traZODone (DESYREL) 50 MG tablet   venlafaxine XR (EFFEXOR XR) 37.5 MG 24 hr capsule   Sleep disturbance       Will start trazodone 563mdaily. Discussed how to properly use medication. Discussed side effects and benefits of medication.  Follow up in 1 month.    Need for influenza vaccination       Relevant Orders   Flu Vaccine QUAD High Dose(Fluad)        Follow up plan: Return in about 1 month (around 08/31/2021) for Mood and Sleep.

## 2021-07-31 NOTE — Patient Instructions (Signed)
Visit Information  Thank you for taking time to visit with me today. Please don't hesitate to contact me if I can be of assistance to you before our next scheduled telephone appointment.  Following are the goals we discussed today:  RNCM Clinical Goal(s):  Patient will verbalize basic understanding of HTN, HLD, DMII, Depression, and insomnia  disease process and self health management plan as evidenced by compliance with the plan of care, following dietary restrictions, and working with CCM team to effectively manage health and well being  take all medications exactly as prescribed and will call provider for medication related questions as evidenced by taking medications as prescribed and calling for refills before running out of medications     attend all scheduled medical appointments: 08-01-2021 as evidenced by keeping appointments and calling the office for needed appointment changes         demonstrate improved and ongoing health management independence as evidenced by compliance with plan of care, calling the office for changes, and working with the CCM team to optimize health and well being.        demonstrate a decrease in HTN, HLD, DMII, Depression, and insomnia  exacerbations  as evidenced by discussing concerns with the provider and working with the CCM team for ongoing support and education demonstrate ongoing self health care management ability for effective management of chronic conditions  as evidenced by  working with the CCM team through collaboration with Consulting civil engineer, provider, and care team.    Interventions: 1:1 collaboration with primary care provider regarding development and update of comprehensive plan of care as evidenced by provider attestation and co-signature Inter-disciplinary care team collaboration (see longitudinal plan of care) Evaluation of current treatment plan related to  self management and patient's adherence to plan as established by provider      Diabetes:  (Status: Goal on Track (progressing): YES.) Long Term Goal         Lab Results  Component Value Date    HGBA1C 11.2 (A) 07/28/2021  Assessed patient's understanding of A1c goal: <7% Provided education to patient about basic DM disease process; Reviewed medications with patient and discussed importance of medication adherence. 07-31-2021: Is working with the endocrinologist office to see about getting assistance with medications to help control his DM.;        Reviewed prescribed diet with patient heart healthy/ADA diet ; Counseled on importance of regular laboratory monitoring as prescribed;        Discussed plans with patient for ongoing care management follow up and provided patient with direct contact information for care management team;      Provided patient with written educational materials related to hypo and hyperglycemia and importance of correct treatment;       Reviewed scheduled/upcoming provider appointments including: 08-01-2021 with the pcp, saw endocrinologist recently ;         Advised patient, providing education and rationale, to check cbg before meals and at bedtime, when you have symptoms of low or high blood sugar, and before and after exercise and record        call provider for findings outside established parameters;       Referral made to pharmacy team for assistance with medications cost constraints and management ;       Review of patient status, including review of consultants reports, relevant laboratory and other test results, and medications completed;         Depression and Insomnia  (Status: Goal on Track (progressing):  YES.) Long Term Goal  Evaluation of current treatment plan related to Depression and Insomnia  , Mental Health Concerns  self-management and patient's adherence to plan as established by provider. Discussed plans with patient for ongoing care management follow up and provided patient with direct contact information for care  management team Advised patient to discuss his depression and insomnia with the pcp tomorrow. The patient states that his wife says he is depressed. He says he is only sleeping about 3 hours a night. ; Provided education to patient re: sleep hygiene, healthy sleep habits, pacing activity; Reviewed scheduled/upcoming provider appointments including 08-01-2021 with the pcp ; Discussed plans with patient for ongoing care management follow up and provided patient with direct contact information for care management team; Advised patient to discuss medication options that would help with sleep with provider; Screening for signs and symptoms of depression related to chronic disease state;  Assessed social determinant of health barriers;    Hyperlipidemia:  (Status: Goal on Track (progressing): YES.) Long Term Goal       Lab Results  Component Value Date    CHOL 220 (H) 05/01/2021    HDL 24 (L) 05/01/2021    LDLCALC 91 05/01/2021    LDLDIRECT 81.0 02/11/2020    TRIG 633 (HH) 05/01/2021    CHOLHDL 9.2 (H) 05/01/2021      Medication review performed; medication list updated in electronic medical record.  Provider established cholesterol goals reviewed; Counseled on importance of regular laboratory monitoring as prescribed; Provided HLD educational materials; Reviewed role and benefits of statin for ASCVD risk reduction; Discussed strategies to manage statin-induced myalgias; Reviewed importance of limiting foods high in cholesterol; Reviewed exercise goals and target of 150 minutes per week;   Hypertension: (Status: Goal on Track (progressing): YES. Goal Not Met.) Last practice recorded BP readings:     BP Readings from Last 3 Encounters:  07/28/21 (!) 142/80  05/26/21 140/60  05/01/21 (!) 152/85  Most recent eGFR/CrCl:       Lab Results  Component Value Date    EGFR 40 (L) 05/01/2021    No components found for: CRCL   Evaluation of current treatment plan related to hypertension  self management and patient's adherence to plan as established by provider;   Provided education to patient re: stroke prevention, s/s of heart attack and stroke; Reviewed prescribed diet heart healthy/ADA diet  Reviewed medications with patient and discussed importance of compliance;  Discussed plans with patient for ongoing care management follow up and provided patient with direct contact information for care management team; Advised patient, providing education and rationale, to monitor blood pressure daily and record, calling PCP for findings outside established parameters;  Advised patient to discuss blood pressure trends  with provider; Provided education on prescribed diet heart healthy/ADA diet ;  Discussed complications of poorly controlled blood pressure such as heart disease, stroke, circulatory complications, vision complications, kidney impairment, sexual dysfunction;    Patient Goals/Self-Care Activities: Take medications as prescribed   Attend all scheduled provider appointments Call pharmacy for medication refills 3-7 days in advance of running out of medications Attend church or other social activities Perform all self care activities independently  Perform IADL's (shopping, preparing meals, housekeeping, managing finances) independently Call provider office for new concerns or questions  Work with the social worker to address care coordination needs and will continue to work with the clinical team to address health care and disease management related needs call 911 call the Suicide and Crisis Lifeline: 988  call the [place] National Suicide Prevention Lifeline: 862-343-8625 call 1-800-273-TALK (toll free, 24 hour hotline) if experiencing a Mental Health or [place]  keep appointment with eye doctor check blood sugar at prescribed times: before meals and at bedtime, when you have symptoms of low or high blood sugar, and before and after exercise check feet  daily for cuts, sores or redness enter blood sugar readings and medication or insulin into daily log take the blood sugar log to all doctor visits trim toenails straight across drink 6 to 8 glasses of water each day eat fish at least once per week fill half of plate with vegetables limit fast food meals to no more than 1 per week manage portion size prepare main meal at home 3 to 5 days each week read food labels for fat, fiber, carbohydrates and portion size reduce red meat to 2 to 3 times a week set a realistic goal do heel pump exercise 2 to 3 times each day keep feet up while sitting wash and dry feet carefully every day wear comfortable, cotton socks wear comfortable, well-fitting shoes check blood pressure weekly choose a place to take my blood pressure (home, clinic or office, retail store) learn about high blood pressure take blood pressure log to all doctor appointments call doctor for signs and symptoms of high blood pressure develop an action plan for high blood pressure keep all doctor appointments take medications for blood pressure exactly as prescribed report new symptoms to your doctor eat more whole grains, fruits and vegetables, lean meats and healthy fats - call for medicine refill 2 or 3 days before it runs out - take all medications exactly as prescribed - call doctor with any symptoms you believe are related to your medicine - call doctor when you experience any new symptoms - go to all doctor appointments as scheduled - adhere to prescribed diet: heart healthy/ADA diet     Our next appointment is by telephone on 09-26-2021 at 4 pm  Please call the care guide team at [phone] if you need to cancel or reschedule your appointment.   Please call 911 call the Suicide and Crisis Lifeline: 988 call the [place] National Suicide Prevention Lifeline: 925-867-4934 call 1-800-273-TALK (toll free, 24 hour hotline) if you are experiencing a Mental Health or  [place] or need someone to talk to.  Patient verbalizes understanding of instructions provided today and agrees to view in [place].   Noreene Larsson RN, MSN, [facility] Mobile: [phone]

## 2021-08-01 ENCOUNTER — Telehealth: Payer: Medicare Other

## 2021-08-01 ENCOUNTER — Ambulatory Visit (INDEPENDENT_AMBULATORY_CARE_PROVIDER_SITE_OTHER): Payer: Medicare Other | Admitting: Nurse Practitioner

## 2021-08-01 ENCOUNTER — Other Ambulatory Visit: Payer: Self-pay

## 2021-08-01 ENCOUNTER — Encounter: Payer: Self-pay | Admitting: Nurse Practitioner

## 2021-08-01 VITALS — BP 127/75 | HR 78 | Temp 97.4°F | Ht 71.0 in | Wt 295.8 lb

## 2021-08-01 DIAGNOSIS — E1169 Type 2 diabetes mellitus with other specified complication: Secondary | ICD-10-CM | POA: Diagnosis not present

## 2021-08-01 DIAGNOSIS — E113511 Type 2 diabetes mellitus with proliferative diabetic retinopathy with macular edema, right eye: Secondary | ICD-10-CM

## 2021-08-01 DIAGNOSIS — E1122 Type 2 diabetes mellitus with diabetic chronic kidney disease: Secondary | ICD-10-CM

## 2021-08-01 DIAGNOSIS — E113592 Type 2 diabetes mellitus with proliferative diabetic retinopathy without macular edema, left eye: Secondary | ICD-10-CM

## 2021-08-01 DIAGNOSIS — Z23 Encounter for immunization: Secondary | ICD-10-CM | POA: Diagnosis not present

## 2021-08-01 DIAGNOSIS — E1159 Type 2 diabetes mellitus with other circulatory complications: Secondary | ICD-10-CM | POA: Diagnosis not present

## 2021-08-01 DIAGNOSIS — G479 Sleep disorder, unspecified: Secondary | ICD-10-CM

## 2021-08-01 DIAGNOSIS — N183 Chronic kidney disease, stage 3 unspecified: Secondary | ICD-10-CM

## 2021-08-01 DIAGNOSIS — I152 Hypertension secondary to endocrine disorders: Secondary | ICD-10-CM

## 2021-08-01 DIAGNOSIS — E1142 Type 2 diabetes mellitus with diabetic polyneuropathy: Secondary | ICD-10-CM

## 2021-08-01 DIAGNOSIS — E785 Hyperlipidemia, unspecified: Secondary | ICD-10-CM

## 2021-08-01 DIAGNOSIS — E113591 Type 2 diabetes mellitus with proliferative diabetic retinopathy without macular edema, right eye: Secondary | ICD-10-CM

## 2021-08-01 DIAGNOSIS — Z794 Long term (current) use of insulin: Secondary | ICD-10-CM

## 2021-08-01 DIAGNOSIS — F339 Major depressive disorder, recurrent, unspecified: Secondary | ICD-10-CM

## 2021-08-01 MED ORDER — VENLAFAXINE HCL ER 37.5 MG PO CP24: 37.5000 mg | ORAL_CAPSULE | Freq: Every day | ORAL | 0 refills | Status: DC

## 2021-08-01 MED ORDER — TRAZODONE HCL 50 MG PO TABS: 25.0000 mg | ORAL_TABLET | Freq: Every evening | ORAL | 3 refills | Status: DC | PRN

## 2021-08-01 NOTE — Assessment & Plan Note (Addendum)
Chronic.  Controlled.  Continue with current medication regimen.  Labs ordered today.  Return to clinic in 3 months for reevaluation.  Call sooner if concerns arise.   

## 2021-08-01 NOTE — Assessment & Plan Note (Addendum)
Chronic.  Controlled.  Continue with current medication regimen Amlodipine 5mg  and HCTZ 25mg .  Labs ordered today.  Return to clinic in 3 months for reevaluation.  Call sooner if concerns arise.

## 2021-08-01 NOTE — Assessment & Plan Note (Signed)
Followed by Ophthalmology. Up to date on eye exam.  Continue to follow their recommendations.

## 2021-08-01 NOTE — Assessment & Plan Note (Addendum)
Chronic.  Controlled.  Continue with current medication regimen of Atorvastatin 80mg  daily.  Labs ordered today.  Return to clinic in 3 months for reevaluation.  Call sooner if concerns arise.

## 2021-08-01 NOTE — Assessment & Plan Note (Addendum)
Chronic.  Controlled.  Continue with current medication regimen of Gabapentin 100mg  TID.  Labs ordered today.  Return to clinic in 3 months for reevaluation.  Call sooner if concerns arise.

## 2021-08-01 NOTE — Assessment & Plan Note (Signed)
Chronic.  Uncontrolled.  Continue with current medication regimen of Novolog 200u daily.  See's Endocrinology.  He is trying to get Patient Assistance for Trulicity.  Last A1c on 07/28/21 was 11.8.  Return to clinic in 3 months for reevaluation.  Call sooner if concerns arise.

## 2021-08-02 LAB — COMPREHENSIVE METABOLIC PANEL
ALT: 104 IU/L — ABNORMAL HIGH (ref 0–44)
AST: 31 IU/L (ref 0–40)
Albumin/Globulin Ratio: 2 (ref 1.2–2.2)
Albumin: 4.4 g/dL (ref 3.8–4.8)
Alkaline Phosphatase: 69 IU/L (ref 44–121)
BUN/Creatinine Ratio: 12 (ref 10–24)
BUN: 24 mg/dL (ref 8–27)
Bilirubin Total: 0.5 mg/dL (ref 0.0–1.2)
CO2: 25 mmol/L (ref 20–29)
Calcium: 9.4 mg/dL (ref 8.6–10.2)
Chloride: 101 mmol/L (ref 96–106)
Creatinine, Ser: 2.01 mg/dL — ABNORMAL HIGH (ref 0.76–1.27)
Globulin, Total: 2.2 g/dL (ref 1.5–4.5)
Glucose: 309 mg/dL — ABNORMAL HIGH (ref 70–99)
Potassium: 4.1 mmol/L (ref 3.5–5.2)
Sodium: 142 mmol/L (ref 134–144)
Total Protein: 6.6 g/dL (ref 6.0–8.5)
eGFR: 36 mL/min/{1.73_m2} — ABNORMAL LOW (ref >59)

## 2021-08-02 LAB — LIPID PANEL
Chol/HDL Ratio: 9.5 ratio — ABNORMAL HIGH (ref 0.0–5.0)
Cholesterol, Total: 229 mg/dL — ABNORMAL HIGH (ref 100–199)
HDL: 24 mg/dL — ABNORMAL LOW (ref >39)
LDL Chol Calc (NIH): 106 mg/dL — ABNORMAL HIGH (ref 0–99)
Triglycerides: 574 mg/dL (ref 0–149)
VLDL Cholesterol Cal: 99 mg/dL — ABNORMAL HIGH (ref 5–40)

## 2021-08-02 LAB — HEMOGLOBIN A1C
Est. average glucose Bld gHb Est-mCnc: 266 mg/dL
Hgb A1c MFr Bld: 10.9 % — ABNORMAL HIGH (ref 4.8–5.6)

## 2021-08-02 NOTE — Telephone Encounter (Signed)
error 

## 2021-08-02 NOTE — Addendum Note (Signed)
Addended by: Jon Billings on: 08/02/2021 10:47 AM   Modules accepted: Orders

## 2021-08-02 NOTE — Progress Notes (Signed)
Please let patient know that his lab work shows that his kidney function has gradually worsened over the last 6 months.  I have placed a referral for him to see Nephrology.  A1c is 10.9.  Triglycerides are still elevated but improving from prior.  Continue with current medication regimen.  Please let me know if he has any questions.

## 2021-08-07 ENCOUNTER — Telehealth: Payer: Medicare Other

## 2021-08-08 ENCOUNTER — Telehealth: Payer: Self-pay

## 2021-08-08 NOTE — Telephone Encounter (Signed)
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 Patient dropped forms off on 08/08/2021.

## 2021-08-09 ENCOUNTER — Ambulatory Visit (INDEPENDENT_AMBULATORY_CARE_PROVIDER_SITE_OTHER): Payer: Medicare Other | Admitting: Dermatology

## 2021-08-09 ENCOUNTER — Other Ambulatory Visit: Payer: Self-pay

## 2021-08-09 DIAGNOSIS — E785 Hyperlipidemia, unspecified: Secondary | ICD-10-CM | POA: Diagnosis not present

## 2021-08-09 DIAGNOSIS — I10 Essential (primary) hypertension: Secondary | ICD-10-CM | POA: Diagnosis not present

## 2021-08-09 DIAGNOSIS — B079 Viral wart, unspecified: Secondary | ICD-10-CM | POA: Diagnosis not present

## 2021-08-09 DIAGNOSIS — F32A Depression, unspecified: Secondary | ICD-10-CM

## 2021-08-09 DIAGNOSIS — Z794 Long term (current) use of insulin: Secondary | ICD-10-CM

## 2021-08-09 DIAGNOSIS — E1159 Type 2 diabetes mellitus with other circulatory complications: Secondary | ICD-10-CM | POA: Diagnosis not present

## 2021-08-09 NOTE — Patient Instructions (Addendum)
Recommend over the counter urea 40% cream once daily.     If You Need Anything After Your Visit  If you have any questions or concerns for your doctor, please call our main line at [phone] and press option 4 to reach your doctor's medical assistant. If no one answers, please leave a voicemail as directed and we will return your call as soon as possible. Messages left after 4 pm will be answered the following business day.   You may also send [place] a message via [place]. We typically respond to MyChart messages within 1-2 business days.  For prescription refills, please ask your pharmacy to contact our office. Our fax number is [phone].  If you have an urgent issue when the clinic is closed that cannot wait until the next business day, you can page your doctor at the number below.    Please note that while we do our best to be available for urgent issues outside of office hours, we are not available 24/7.   If you have an urgent issue and are unable to reach [place], you may choose to seek medical care at your doctor's office, retail clinic, urgent care center, or emergency room.  If you have a medical emergency, please immediately call 911 or go to the emergency department.  Pager Numbers  - Dr. Nehemiah Massed: [phone]  - Dr. Laurence Ferrari: [phone]  - Dr. Nicole Kindred: [phone]  In the event of inclement weather, please call our main line at [phone] for an update on the status of any delays or closures.  Dermatology Medication Tips: Please keep the boxes that topical medications come in in order to help keep track of the instructions about where and how to use these. Pharmacies typically print the medication instructions only on the boxes and not directly on the medication tubes.   If your medication is too expensive, please contact our office at [phone] option 4 or send [place] a message through [place].   We are unable to tell what your co-pay for medications will be in advance  as this is different depending on your insurance coverage. However, we may be able to find a substitute medication at lower cost or fill out paperwork to get insurance to cover a needed medication.   If a prior authorization is required to get your medication covered by your insurance company, please allow [place] 1-2 business days to complete this process.  Drug prices often vary depending on where the prescription is filled and some pharmacies may offer cheaper prices.  The website www.goodrx.com contains coupons for medications through different pharmacies. The prices here do not account for what the cost may be with help from insurance (it may be cheaper with your insurance), but the website can give you the price if you did not use any insurance.  - You can print the associated coupon and take it with your prescription to the pharmacy.  - You may also stop by our office during regular business hours and pick up a GoodRx coupon card.  - If you need your prescription sent electronically to a different pharmacy, notify our office through [facility] or by phone at [phone] option 4.     Si Usted Necesita Algo Despus de Su Visita  Tambin puede enviarnos un mensaje a travs de Pharmacist, community. Por lo general respondemos a los mensajes de MyChart en el transcurso de 1 a 2 das hbiles.  Para renovar recetas, por favor pida a su farmacia que se ponga en contacto  con nuestra oficina. Harland Dingwall de fax es [place] [phone].  Si tiene un asunto urgente cuando la clnica est cerrada y que no puede esperar hasta el siguiente da hbil, puede llamar/localizar a su doctor(a) al nmero que aparece a continuacin.   Por favor, tenga en cuenta que aunque hacemos todo lo posible para estar disponibles para asuntos urgentes fuera del horario de [place], no estamos disponibles las 24 horas del da, los 7 das de la [place].   Si tiene un problema urgente y no puede comunicarse con nosotros, puede optar  por buscar atencin mdica  en el consultorio de su doctor(a), en una clnica privada, en un centro de atencin urgente o en una sala de emergencias.  Si tiene Engineering geologist, por favor llame inmediatamente al 911 o vaya a la sala de emergencias.  Nmeros de bper  - Dr. Nehemiah Massed: [phone]  - Dra. Moye: [phone]  - Dra. Nicole Kindred: [phone]  En caso de inclemencias del [place], por favor llame a Johnsie Kindred principal al [phone] para una actualizacin sobre el [place] de cualquier retraso o cierre.  Consejos para la medicacin en dermatologa: Por favor, guarde las cajas en las que vienen los medicamentos de uso tpico para ayudarle a seguir las instrucciones sobre dnde y cmo usarlos. Las farmacias generalmente imprimen las instrucciones del medicamento slo en las cajas y no directamente en los tubos del [place].   Si su medicamento es muy caro, por favor, pngase en contacto con Zigmund Daniel llamando al [phone] y presione la opcin 4 o envenos un mensaje a travs de Pharmacist, community.   No podemos decirle cul ser su copago por los medicamentos por adelantado ya que esto es diferente dependiendo de la cobertura de su seguro. Sin embargo, es posible que podamos encontrar un medicamento sustituto a Electrical engineer un formulario para que el seguro cubra el medicamento que se considera necesario.   Si se requiere una autorizacin previa para que su compaa de seguros [place] su medicamento, por favor permtanos de 1 a 2 das hbiles para completar este proceso.  Los precios de los medicamentos varan con frecuencia dependiendo del Environmental consultant de dnde se surte la receta y alguna farmacias pueden ofrecer precios ms baratos.  El sitio web www.goodrx.com tiene cupones para medicamentos de Airline pilot. Los precios aqu no tienen en cuenta lo que podra costar con la ayuda del seguro (puede ser ms barato con su seguro), pero el sitio web puede darle el precio si  no utiliz Research scientist (physical sciences).  - Puede imprimir el cupn correspondiente y llevarlo con su receta a la farmacia.  - Tambin puede pasar por nuestra oficina durante el horario de atencin regular y Charity fundraiser una tarjeta de cupones de GoodRx.  - Si necesita que su receta se enve electrnicamente a una farmacia diferente, informe a nuestra oficina a travs de MyChart de [place] o por telfono llamando al [phone] y presione la opcin 4.

## 2021-08-09 NOTE — Progress Notes (Signed)
   Follow-Up Visit   Subjective  Andrew Pruitt is a 65 y.o. male who presents for the following: Warts (S/P LN2 and Catherone Plus - Currently using 5FU/salicylic acid paste nightly but the amount he is prescribed only lasts a few days so he is using OTC Compound W. ).  The following portions of the chart were reviewed this encounter and updated as appropriate:   Tobacco  Allergies  Meds  Problems  Med Hx  Surg Hx  Fam Hx     Review of Systems:  No other skin or systemic complaints except as noted in HPI or Assessment and Plan.  Objective  Well appearing patient in no apparent distress; mood and affect are within normal limits.  A focused examination was performed including the feet and hands. Relevant physical exam findings are noted in the Assessment and Plan.  B/L feet and hands x 19 (19) Numerous verrucous papules -- Discussed viral etiology and contagion.   Assessment & Plan  Viral warts, unspecified type (19) B/L feet and hands x 19  Bx proven - Discussed viral etiology and risk of spread.  Discussed multiple treatments may be required to clear warts.  Discussed possible post-treatment dyspigmentation and risk of recurrence.  Squaric acid sensitization applied today to the R upper inner arm.  Discussed candida antigen and bleomycin injections.   Continue 5FU/Sal acid mix QHS. Recommend urea 40% cream QHS.   Squaric acid 3% and Canthacur Plus applied to warts on the feet. Advised patient to wash off in four hours.   Destruction of lesion - B/L feet and hands x 19 Complexity: simple   Destruction method: cryotherapy   Informed consent: discussed and consent obtained   Timeout:  patient name, date of birth, surgical site, and procedure verified Lesion destroyed using liquid nitrogen: Yes   Region frozen until ice ball extended beyond lesion: Yes   Outcome: patient tolerated procedure well with no complications   Post-procedure details: wound care instructions given     Destruction of lesion - B/L feet and hands x 19 Complexity: simple   Destruction method: chemical removal   Informed consent: discussed and consent obtained   Chemical destruction method: cantharidin   Chemical destruction method comment:  Cantherone plus and squaric acid 3% Application time:  4 hours Procedure instructions: patient instructed to wash and dry area   Outcome: patient tolerated procedure well with no complications    Return for wart follow up in 2-4 weeks.  Luther Redo, CMA, am acting as scribe for Sarina Ser, MD . Documentation: I have reviewed the above documentation for accuracy and completeness, and I agree with the above.  Sarina Ser, MD

## 2021-08-16 ENCOUNTER — Encounter: Payer: Self-pay | Admitting: Dermatology

## 2021-08-28 ENCOUNTER — Other Ambulatory Visit: Payer: Self-pay | Admitting: Nurse Practitioner

## 2021-08-29 NOTE — Telephone Encounter (Signed)
Medication request for 90 days refused due to last OV note to return in 30 days for evaluation of medications.

## 2021-08-31 ENCOUNTER — Ambulatory Visit: Payer: Medicare Other | Admitting: Nurse Practitioner

## 2021-09-06 ENCOUNTER — Encounter: Payer: Self-pay | Admitting: Gastroenterology

## 2021-09-06 NOTE — Progress Notes (Signed)
BP 123/73    Pulse 76    Temp 98.5 F (36.9 C) (Oral)    Ht _0  (1.778 m)    Wt 289 lb 12.8 oz (131.5 kg)    SpO2 95%    BMI 41.58 kg/m    Subjective:    Patient ID: Andrew Pruitt, male    DOB: 1955-10-23, 65 y.o.   MRN: [id number]  HPI: Andrew Pruitt is a 65 y.o. male  Chief Complaint  Patient presents with   Depression   Insomnia    SLEEP DISTURBANCE Patient states he is sleeping fair.  He stopped taking the trazodone due to it causing him to have night mares.    MOOD Patient states he feels like his mood is decent.  He hasn't noticed much of a difference.  He feels more mellow than before. He lives with his daughter who constantly wants to move.    [place] Office Visit from 09/07/2021 in [facility]  PHQ-9 Total Score 8      GAD 7 : Generalized Anxiety Score 09/07/2021 08/01/2021 05/31/2021  Nervous, Anxious, on Edge 0 1 1  Control/stop worrying 0 2 3  Worry too much - different things 0 2 2  Trouble relaxing 0 2 0  Restless 0 2 1  Easily annoyed or irritable _1 Afraid - awful might happen 0 2 3  Total GAD 7 Score _2 Anxiety Difficulty Not difficult at all Not difficult at all Very difficult      Relevant past medical, surgical, family and social history reviewed and updated as indicated. Interim medical history since our last visit reviewed. Allergies and medications reviewed and updated.  Review of Systems  Eyes:  Negative for visual disturbance.  Respiratory:  Negative for chest tightness and shortness of breath.   Cardiovascular:  Negative for chest pain, palpitations and leg swelling.  Endocrine: Negative for polydipsia and polyuria.  Neurological:  Negative for dizziness, light-headedness, numbness and headaches.  Psychiatric/Behavioral:  Positive for dysphoric mood and sleep disturbance. Negative for suicidal ideas. The patient is nervous/anxious.    Per HPI unless specifically indicated above     Objective:    BP  123/73    Pulse 76    Temp 98.5 F (36.9 C) (Oral)    Ht _3  (1.778 m)    Wt 289 lb 12.8 oz (131.5 kg)    SpO2 95%    BMI 41.58 kg/m   Wt Readings from Last 3 Encounters:  09/07/21 289 lb 12.8 oz (131.5 kg)  08/01/21 295 lb 12.8 oz (134.2 kg)  07/28/21 298 lb 12.8 oz (135.5 kg)    Physical Exam Vitals and nursing note reviewed.  Constitutional:      General: He is not in acute distress.    Appearance: Normal appearance. He is obese. He is not ill-appearing, toxic-appearing or diaphoretic.  HENT:     Head: Normocephalic.     Right Ear: External ear normal.     Left Ear: External ear normal.     Nose: Nose normal. No congestion or rhinorrhea.     Mouth/Throat:     Mouth: Mucous membranes are moist.  Eyes:     General:        Right eye: No discharge.        Left eye: No discharge.     Extraocular Movements: Extraocular movements intact.     Conjunctiva/sclera: Conjunctivae normal.     Pupils: Pupils are equal,  round, and reactive to light.  Cardiovascular:     Rate and Rhythm: Normal rate and regular rhythm.     Heart sounds: No murmur heard. Pulmonary:     Effort: Pulmonary effort is normal. No respiratory distress.     Breath sounds: Normal breath sounds. No wheezing, rhonchi or rales.  Abdominal:     General: Abdomen is flat. Bowel sounds are normal.  Musculoskeletal:     Cervical back: Normal range of motion and neck supple.  Skin:    General: Skin is warm and dry.     Capillary Refill: Capillary refill takes less than 2 seconds.  Neurological:     General: No focal deficit present.     Mental Status: He is alert and oriented to person, place, and time.  Psychiatric:        Mood and Affect: Mood normal.        Behavior: Behavior normal.        Thought Content: Thought content normal.        Judgment: Judgment normal.    Results for orders placed or performed in visit on 08/01/21  Comp Met (CMET)  Result Value Ref Range   Glucose 309 (H) 70 - 99 mg/dL   BUN 24  8 - 27 mg/dL   Creatinine, Ser 2.01 (H) 0.76 - 1.27 mg/dL   eGFR 36 (L) >59 mL/min/1.73   BUN/Creatinine Ratio 12 10 - 24   Sodium 142 134 - 144 mmol/L   Potassium 4.1 3.5 - 5.2 mmol/L   Chloride 101 96 - 106 mmol/L   CO2 25 20 - 29 mmol/L   Calcium 9.4 8.6 - 10.2 mg/dL   Total Protein 6.6 6.0 - 8.5 g/dL   Albumin 4.4 3.8 - 4.8 g/dL   Globulin, Total 2.2 1.5 - 4.5 g/dL   Albumin/Globulin Ratio 2.0 1.2 - 2.2   Bilirubin Total 0.5 0.0 - 1.2 mg/dL   Alkaline Phosphatase 69 44 - 121 IU/L   AST 31 0 - 40 IU/L   ALT 104 (H) 0 - 44 IU/L  Lipid Profile  Result Value Ref Range   Cholesterol, Total 229 (H) 100 - 199 mg/dL   Triglycerides 574 (HH) 0 - 149 mg/dL   HDL 24 (L) >39 mg/dL   VLDL Cholesterol Cal 99 (H) 5 - 40 mg/dL   LDL Chol Calc (NIH) 106 (H) 0 - 99 mg/dL   Chol/HDL Ratio 9.5 (H) 0.0 - 5.0 ratio  HgB A1c  Result Value Ref Range   Hgb A1c MFr Bld 10.9 (H) 4.8 - 5.6 %   Est. average glucose Bld gHb Est-mCnc 266 mg/dL      Assessment & Plan:   Problem List Items Addressed This Visit       Other   Depression - Primary    Chronic.  Improved since last visit.  Patient agrees to increase Effexor to 32m daily.  New prescription sent to the pharmacy.  Return to clinic in 1 months for reevaluation.  Call sooner if concerns arise.        Relevant Medications   venlafaxine XR (EFFEXOR XR) 75 MG 24 hr capsule   Other Visit Diagnoses     Sleep disturbance       Offered patient Lunesta. He would like to try taking 2 tabs of Trazodone prior to starting Lunesta. Will follow up in 1 month for reevaluation.        Follow up plan: Return in about 1 month (around 10/08/2021) for  Depression/Anxiety FU.

## 2021-09-07 ENCOUNTER — Other Ambulatory Visit: Payer: Self-pay

## 2021-09-07 ENCOUNTER — Encounter: Payer: Self-pay | Admitting: Nurse Practitioner

## 2021-09-07 ENCOUNTER — Ambulatory Visit (INDEPENDENT_AMBULATORY_CARE_PROVIDER_SITE_OTHER): Payer: Medicare Other | Admitting: Nurse Practitioner

## 2021-09-07 VITALS — BP 123/73 | HR 76 | Temp 98.5°F | Ht 70.0 in | Wt 289.8 lb

## 2021-09-07 DIAGNOSIS — F32A Depression, unspecified: Secondary | ICD-10-CM

## 2021-09-07 DIAGNOSIS — F339 Major depressive disorder, recurrent, unspecified: Secondary | ICD-10-CM | POA: Insufficient documentation

## 2021-09-07 DIAGNOSIS — G479 Sleep disorder, unspecified: Secondary | ICD-10-CM | POA: Diagnosis not present

## 2021-09-07 MED ORDER — VENLAFAXINE HCL ER 75 MG PO CP24: 75.0000 mg | ORAL_CAPSULE | Freq: Every day | ORAL | 0 refills | Status: DC

## 2021-09-07 NOTE — Assessment & Plan Note (Signed)
Chronic.  Improved since last visit.  Patient agrees to increase Effexor to 75mg  daily.  New prescription sent to the pharmacy.  Return to clinic in 1 months for reevaluation.  Call sooner if concerns arise.

## 2021-09-10 ENCOUNTER — Other Ambulatory Visit: Payer: Self-pay | Admitting: Nurse Practitioner

## 2021-09-12 NOTE — Telephone Encounter (Signed)
Requested Prescriptions  Pending Prescriptions Disp Refills   amLODipine (NORVASC) 5 MG tablet [Pharmacy Med Name: AMLODIPINE BESYLATE 5 MG TAB] 90 tablet 0    Sig: TAKE 1 TABLET BY MOUTH DAILY. FURTHER REFILLS WILL NEED TO COME FROM NEW PRIMARY CARE PROVIDER     Cardiovascular:  Calcium Channel Blockers Passed - 09/10/2021  1:53 AM      Passed - Last BP in normal range    BP Readings from Last 1 Encounters:  09/07/21 123/73         Passed - Valid encounter within last 6 months    Recent Outpatient Visits          5 days ago Depression, unspecified depression type   [facility] Jon Billings, NP   1 month ago Hypertension associated with diabetes ([place])   [facility] Jon Billings, NP   4 months ago Uncontrolled type II diabetes mellitus with chronic kidney disease ([place])   [facility] Jon Billings, NP   7 months ago Hypertension associated with diabetes [facility])   [facility] Jon Billings, NP   8 months ago Hypertension associated with diabetes [facility])   [facility] Jon Billings, NP      Future Appointments            In 1 week Ralene Bathe, MD [place]   In 3 weeks Jon Billings, NP [facility] [place]

## 2021-09-18 ENCOUNTER — Telehealth: Payer: Self-pay

## 2021-09-18 NOTE — Chronic Care Management (AMB) (Signed)
Error, chart review patient has appt with [facility] this month.

## 2021-09-20 ENCOUNTER — Other Ambulatory Visit: Payer: Self-pay

## 2021-09-20 ENCOUNTER — Ambulatory Visit (INDEPENDENT_AMBULATORY_CARE_PROVIDER_SITE_OTHER): Payer: Medicare Other | Admitting: Dermatology

## 2021-09-20 DIAGNOSIS — B079 Viral wart, unspecified: Secondary | ICD-10-CM | POA: Diagnosis not present

## 2021-09-20 NOTE — Progress Notes (Signed)
° °  Follow-Up Visit   Subjective  Andrew Pruitt is a 66 y.o. male who presents for the following: Warts (1 month follow up - mostly of feet treated with LN2, Squaric acid, Cantherone Plus. He is using 5FU/Sal acid cream qd). He feels like things are improving.  The following portions of the chart were reviewed this encounter and updated as appropriate:   Tobacco   Allergies   Meds   Problems   Med Hx   Surg Hx   Fam Hx      Review of Systems:  No other skin or systemic complaints except as noted in HPI or Assessment and Plan.  Objective  Well appearing patient in no apparent distress; mood and affect are within normal limits.  A focused examination was performed including feet. Relevant physical exam findings are noted in the Assessment and Plan.  Bilateral feet (20) Verrucous papules -- Discussed viral etiology and contagion.    Assessment & Plan  Viral warts, - biopsy proven (20) Improving with treatment Bilateral feet  Squaric Acid 3% applied to warts today. Prior to application reviewed risk of inflammation and irritation.   Continue 5FU/Sal acid qhs  Destruction of lesion - Bilateral feet Complexity: simple   Destruction method: cryotherapy   Informed consent: discussed and consent obtained   Timeout:  patient name, date of birth, surgical site, and procedure verified Lesion destroyed using liquid nitrogen: Yes   Region frozen until ice ball extended beyond lesion: Yes   Outcome: patient tolerated procedure well with no complications   Post-procedure details: wound care instructions given    Destruction of lesion - Bilateral feet  Destruction method: chemical removal   Informed consent: discussed and consent obtained   Timeout:  patient name, date of birth, surgical site, and procedure verified Chemical destruction method: cantharidin   Procedure instructions: patient instructed to wash and dry area   Outcome: patient tolerated procedure well with no complications    Post-procedure details: wound care instructions given    Return in about 1 month (around 10/21/2021) for Follow up. Pt is moving out of town after next visit. I, Ashok Cordia, CMA, am acting as scribe for Sarina Ser, MD . Documentation: I have reviewed the above documentation for accuracy and completeness, and I agree with the above.  Sarina Ser, MD

## 2021-09-20 NOTE — Patient Instructions (Signed)

## 2021-09-22 ENCOUNTER — Other Ambulatory Visit: Payer: Self-pay | Admitting: Endocrinology

## 2021-09-22 ENCOUNTER — Encounter: Payer: Self-pay | Admitting: Dermatology

## 2021-09-23 ENCOUNTER — Other Ambulatory Visit: Payer: Self-pay | Admitting: Endocrinology

## 2021-09-25 ENCOUNTER — Ambulatory Visit (INDEPENDENT_AMBULATORY_CARE_PROVIDER_SITE_OTHER): Payer: Medicare Other

## 2021-09-25 DIAGNOSIS — I152 Hypertension secondary to endocrine disorders: Secondary | ICD-10-CM

## 2021-09-25 DIAGNOSIS — Z794 Long term (current) use of insulin: Secondary | ICD-10-CM

## 2021-09-25 DIAGNOSIS — E1159 Type 2 diabetes mellitus with other circulatory complications: Secondary | ICD-10-CM

## 2021-09-25 DIAGNOSIS — E1122 Type 2 diabetes mellitus with diabetic chronic kidney disease: Secondary | ICD-10-CM

## 2021-09-25 DIAGNOSIS — E1169 Type 2 diabetes mellitus with other specified complication: Secondary | ICD-10-CM

## 2021-09-25 DIAGNOSIS — E785 Hyperlipidemia, unspecified: Secondary | ICD-10-CM

## 2021-09-25 NOTE — Patient Instructions (Signed)
[name],  Thank you for talking with me today. I have included our care plan/goals in the following pages.   Please review and call me at [phone] with any questions.  Thanks! Andrew Pruitt, PharmD Clinical Pharmacist  [phone]  Care Plan : Andrew Pruitt  Updates made by Andrew Pruitt, [facility] since 09/26/2021 12:00 AM     Problem: HTN, HLD, DM2, depression, insomnia   Priority: High     Long-Range Goal: Disease Management   Start Date: 09/25/2021  This Visit's Progress: On track  Recent Progress: Not on track  Priority: High  Note:   Current Barriers:  Unable to independently afford treatment regimen Unable to achieve control of t2dm   Pharmacist Clinical Goal(s):  Patient will verbalize ability to afford treatment regimen achieve adherence to monitoring guidelines and medication adherence to achieve therapeutic efficacy contact provider office for questions/concerns as evidenced notation of same in electronic health record through collaboration with PharmD and provider.   Interventions: 1:1 collaboration with Andrew Billings, NP regarding development and update of comprehensive plan of care as evidenced by provider attestation and co-signature Inter-disciplinary care team collaboration (see longitudinal plan of care) Comprehensive medication review performed; medication list updated in electronic medical record  Hypertension (BP goal <130/80) -Controlled -Current treatment: Amlodipine 5 mg once daily Appropriate, Effective, Safe, Accessible HCTZ 25 mg once daily  Appropriate, Effective, Safe, Accessible -Current home readings: n/a -Current dietary habits: high salt, high carb -Denies hypotensive/hypertensive symptoms -Educated on BP goals and benefits of medications for prevention of heart attack, stroke and kidney damage; Importance of home blood pressure monitoring; Symptoms of hypotension and importance of maintaining adequate  hydration; -Counseled to monitor BP at home 1-2x/wk, document, and provide log at future appointments -Counseled on diet and exercise extensively  Hyperlipidemia: (LDL goal < 100) -Not ideally controlled -Current treatment: Atorvastatin 80 mg once daily  Appropriate, Query effective, Safe, Accessible Fenofibrate 145 mg once daily Appropriate, Query effective, Safe, Accessible -Medications previously tried: n/a  -Side effect treview - no problems noted  -Educated on Cholesterol goals;  Importance of limiting foods high in cholesterol; -Recommended to continue current medication  Diabetes (A1c goal <7%) -Controlled -Now followed at [place]  -Current medications: Novolin 75/25 200 units daily  Appropriate, Query effective, Safe, Accessible Trulicity 8.14 mg SQ Once Weekly (not yet approved)  Appropriate, Query effective, Safe, Accessible Farxiga 10 mg once daily (PAP approved)  Appropriate, Query effective, Safe, Accessible -Medications previously tried: metformin, other insulins   -Current home glucose readings. AVG 190s through CBG (equiv to a1c in the 8-9% range) -Denies hypoglycemic/hyperglycemic symptoms -Current meal patterns: breakfast: sausage, water or diet coke (a liter and a half/day). water late at night. Trying to cut down on bread which has been a major source of carb intake -Current exercise: minimal -Educated on A1c and blood sugar goals; Complications of diabetes including kidney damage, retinal damage, and cardiovascular disease; Proper insulin injection technique; Prevention and management of hypoglycemic episodes; Benefits of routine self-monitoring of blood sugar; Continuous glucose monitoring; -Recommended to continue current medication  Patient Goals/Self-Care Activities Patient will:  - take medications as prescribed as evidenced by patient report and record review collaborate with provider on medication access solutions target a  minimum of 150 minutes of moderate intensity exercise weekly  Medication Assistance: Application for trulicity (endo)  medication assistance program. in process.  Anticipated assistance start date 10/2021.  See plan of care for additional detail.  The patient verbalized understanding of instructions provided today and agreed to receive a MyChart copy of patient instruction and/or educational materials. Telephone follow up appointment with pharmacy team member scheduled for: See next appointment with "Care Management Staff" under "What's Next" below.  Diabetes Mellitus and Nutrition, Adult When you have diabetes, or diabetes mellitus, it is very important to have healthy eating habits because your blood sugar (glucose) levels are greatly affected by what you eat and drink. Eating healthy foods in the right amounts, at about the same times every day, can help you: Manage your blood glucose. Lower your risk of heart disease. Improve your blood pressure. Reach or maintain a healthy weight. What can affect my meal plan? Every person with diabetes is different, and each person has different needs for a meal plan. Your health care provider may recommend that you work with a dietitian to make a meal plan that is best for you. Your meal plan may vary depending on factors such as: The calories you need. The medicines you take. Your weight. Your blood glucose, blood pressure, and cholesterol levels. Your activity level. Other health conditions you have, such as heart or kidney disease. How do carbohydrates affect me? Carbohydrates, also called carbs, affect your blood glucose level more than any other type of food. Eating carbs raises the amount of glucose in your blood. It is important to know how many carbs you can safely have in each meal. This is different for every person. Your dietitian can help you calculate how many carbs you should have at each meal and for each snack. How does alcohol affect  me? Alcohol can cause a decrease in blood glucose (hypoglycemia), especially if you use insulin or take certain diabetes medicines by mouth. Hypoglycemia can be a life-threatening condition. Symptoms of hypoglycemia, such as sleepiness, dizziness, and confusion, are similar to symptoms of having too much alcohol. Do not drink alcohol if: Your health care provider tells you not to drink. You are pregnant, may be pregnant, or are planning to become pregnant. If you drink alcohol: Limit how much you have to: 0-1 drink a day for women. 0-2 drinks a day for men. Know how much alcohol is in your drink. In the [place] one drink equals one 12 oz bottle of beer (355 mL), one 5 oz glass of wine (148 mL), or one 1 oz glass of hard liquor (44 mL). Keep yourself hydrated with water, diet soda, or unsweetened iced tea. Keep in mind that regular soda, juice, and other mixers may contain a lot of sugar and must be counted as carbs. What are tips for following this plan? Reading food labels Start by checking the serving size on the Nutrition Facts label of packaged foods and drinks. The number of calories and the amount of carbs, fats, and other nutrients listed on the label are based on one serving of the item. Many items contain more than one serving per package. Check the total grams (g) of carbs in one serving. Check the number of grams of saturated fats and trans fats in one serving. Choose foods that have a low amount or none of these fats. Check the number of milligrams (mg) of salt (sodium) in one serving. Most people should limit total sodium intake to less than 2,300 mg per day. Always check the nutrition information of foods labeled as "low-fat" or "nonfat." These foods may be higher in added sugar or refined carbs and should be avoided. Talk to your dietitian to identify  your daily goals for nutrients listed on the label. Shopping Avoid buying canned, pre-made, or processed foods. These foods tend to  be high in fat, sodium, and added sugar. Shop around the outside edge of the grocery store. This is where you will most often find fresh fruits and vegetables, bulk grains, fresh meats, and fresh dairy products. Cooking Use low-heat cooking methods, such as baking, instead of high-heat cooking methods, such as deep frying. Cook using healthy oils, such as olive, canola, or sunflower oil. Avoid cooking with butter, cream, or high-fat meats. Meal planning Eat meals and snacks regularly, preferably at the same times every day. Avoid going long periods of time without eating. Eat foods that are high in fiber, such as fresh fruits, vegetables, beans, and whole grains. Eat 4-6 oz (112-168 g) of lean protein each day, such as lean meat, chicken, fish, eggs, or tofu. One ounce (oz) (28 g) of lean protein is equal to: 1 oz (28 g) of meat, chicken, or fish. 1 egg.  cup (62 g) of tofu. Eat some foods each day that contain healthy fats, such as avocado, nuts, seeds, and fish. What foods should I eat? Fruits Berries. Apples. Oranges. Peaches. Apricots. Plums. Grapes. Mangoes. Papayas. Pomegranates. Kiwi. Cherries. Vegetables Leafy greens, including lettuce, spinach, kale, chard, collard greens, mustard greens, and cabbage. Beets. Cauliflower. Broccoli. Carrots. Green beans. Tomatoes. Peppers. Onions. Cucumbers. Brussels sprouts. Grains Whole grains, such as whole-wheat or whole-grain bread, crackers, tortillas, cereal, and pasta. Unsweetened oatmeal. Quinoa. Brown or wild rice. Meats and other proteins Seafood. Poultry without skin. Lean cuts of poultry and beef. Tofu. Nuts. Seeds. Dairy Low-fat or fat-free dairy products such as milk, yogurt, and cheese. The items listed above may not be a complete list of foods and beverages you can eat and drink. Contact a dietitian for more information. What foods should I avoid? Fruits Fruits canned with syrup. Vegetables Canned vegetables. Frozen vegetables  with butter or cream sauce. Grains Refined white flour and flour products such as bread, pasta, snack foods, and cereals. Avoid all processed foods. Meats and other proteins Fatty cuts of meat. Poultry with skin. Breaded or fried meats. Processed meat. Avoid saturated fats. Dairy Full-fat yogurt, cheese, or milk. Beverages Sweetened drinks, such as soda or iced tea. The items listed above may not be a complete list of foods and beverages you should avoid. Contact a dietitian for more information. Questions to ask a health care provider Do I need to meet with a certified diabetes care and education specialist? Do I need to meet with a dietitian? What number can I call if I have questions? When are the best times to check my blood glucose? Where to find more information: American Diabetes Association: diabetes.org Academy of Nutrition and Dietetics: eatright.[employer] of Diabetes and Digestive and Kidney Diseases: [employer] Association of Diabetes Care & Education Specialists: diabeteseducator.org Summary It is important to have healthy eating habits because your blood sugar (glucose) levels are greatly affected by what you eat and drink. It is important to use alcohol carefully. A healthy meal plan will help you manage your blood glucose and lower your risk of heart disease. Your health care provider may recommend that you work with a dietitian to make a meal plan that is best for you. This information is not intended to replace advice given to you by your health care provider. Make sure you discuss any questions you have with your health care provider. Document Revised: 03/30/2020 Document Reviewed: 03/30/2020 Elsevier  Patient Education  2022 [employer].

## 2021-09-25 NOTE — Progress Notes (Signed)
Chronic Care Management Pharmacy Note  09/26/2021 Name:  Treven Holtman MRN:  [id number] DOB:  [birth year]  Summary: Wilder Glade - receives through PAP, reports to have just received 4 month supply  Unclear if Trulicity PAP approved (through endo) - HC, Myriam, to check on by calling [employer] and updating patient   Subjective: Daryll Spisak is an 66 y.o. year old male who is a primary patient of Jon Billings, NP.  The CCM team was consulted for assistance with disease management and care coordination needs.    Engaged with patient by telephone for follow up visit in response to provider referral for pharmacy case management and/or care coordination services.   Consent to Services:  The patient was given information about Chronic Care Management services, agreed to services, and gave verbal consent prior to initiation of services.  Please see initial visit note for detailed documentation.   Patient Care Team: Jon Billings, NP as PCP - General (Nurse Practitioner) Vanita Ingles, RN as Registered Nurse ([place]) Madelin Rear, [facility] as Pharmacist (Pharmacist)  Hospital visits: None in previous 6 months  Objective:  Lab Results  Component Value Date   CREATININE 2.01 (H) 08/01/2021   CREATININE 1.84 (H) 05/01/2021   CREATININE 1.68 (H) 04/05/2021    Lab Results  Component Value Date   HGBA1C 10.9 (H) 08/01/2021   HGBA1C 11.2 (A) 07/28/2021   HGBA1C 11.6 (A) 05/26/2021   Last diabetic Eye exam:  Lab Results  Component Value Date/Time   HMDIABEYEEXA Retinopathy (A) 01/17/2021 12:00 AM    Last diabetic Foot exam: No results found for: HMDIABFOOTEX      Component Value Date/Time   CHOL 229 (H) 08/01/2021 0944   CHOL 220 (H) 05/01/2021 0930   CHOL 200 (H) 01/24/2021 1030   TRIG 574 (HH) 08/01/2021 0944   TRIG 633 (HH) 05/01/2021 0930   TRIG 1,075 (HH) 01/24/2021 1030   HDL 24 (L) 08/01/2021 0944   HDL 24 (L) 05/01/2021 0930   HDL 21 (L) 01/24/2021 1030    CHOLHDL 9.5 (H) 08/01/2021 0944   CHOLHDL 12 02/11/2020 1037   LDLCALC 106 (H) 08/01/2021 0944   LDLCALC 91 05/01/2021 0930   LDLCALC Comment (A) 01/24/2021 1030   LDLDIRECT 81.0 02/11/2020 1037    Hepatic Function Latest Ref Rng & Units 08/01/2021 05/01/2021 02/03/2021  Total Protein 6.0 - 8.5 g/dL 6.6 7.2 6.7  Albumin 3.8 - 4.8 g/dL 4.4 4.4 4.4  AST 0 - 40 IU/L _0 ALT 0 - 44 IU/L 104(H) 57(H) 34  Alk Phosphatase 44 - 121 IU/L 69 69 57  Total Bilirubin 0.0 - 1.2 mg/dL 0.5 0.5 0.6    Lab Results  Component Value Date/Time   TSH 2.95 02/11/2020 10:37 AM    CBC Latest Ref Rng & Units 04/05/2021 02/11/2020  WBC 4.0 - 10.5 K/uL 5.6 4.7  Hemoglobin 13.0 - 17.0 g/dL 16.1 17.4(H)  Hematocrit 39.0 - 52.0 % 44.9 49.5  Platelets 150 - 400 K/uL 305 172.0 Repeated and verified X2.    No results found for: VD25OH  Clinical ASCVD:  The 10-year ASCVD risk score (Arnett DK, et al., 2019) is: 40.6%   Values used to calculate the score:     Age: 64 years     Sex: Male     Is Non-Hispanic African American: No     Diabetic: Yes     Tobacco smoker: No     Systolic Blood Pressure: 737 mmHg  Is BP treated: Yes     HDL Cholesterol: 24 mg/dL     Total Cholesterol: 229 mg/dL   Social History   Tobacco Use  Smoking Status Never  Smokeless Tobacco Never   BP Readings from Last 3 Encounters:  09/07/21 123/73  08/01/21 127/75  07/28/21 (!) 142/80   Pulse Readings from Last 3 Encounters:  09/07/21 76  08/01/21 78  07/28/21 76   Wt Readings from Last 3 Encounters:  09/07/21 289 lb 12.8 oz (131.5 kg)  08/01/21 295 lb 12.8 oz (134.2 kg)  07/28/21 298 lb 12.8 oz (135.5 kg)   BMI Readings from Last 3 Encounters:  09/07/21 41.58 kg/m  08/01/21 41.26 kg/m  07/28/21 42.87 kg/m    Assessment: Review of patient past medical history, allergies, medications, health status, including review of consultants reports, laboratory and other test data, was performed as part of  comprehensive evaluation and provision of chronic care management services.   SDOH:  (Social Determinants of Health) assessments and interventions performed:    CCM Care Plan  Allergies  Allergen Reactions   Niaspan [Niacin] Other (See Comments)    Red skin, red eyes   Trazodone Other (See Comments)    nightmares    Medications Reviewed Today     Reviewed by Ralene Bathe, MD (Physician) on 09/22/21 at 1150  Med List Status: <None>   Medication Order Taking? Sig Documenting Provider Last Dose Status Informant  amLODipine (NORVASC) 5 MG tablet [id number]  TAKE 1 TABLET BY MOUTH DAILY. FURTHER REFILLS WILL NEED TO COME FROM NEW PRIMARY CARE PROVIDER Jon Billings, NP  Active   atorvastatin (LIPITOR) 80 MG tablet [id number] No Take 1 tablet (80 mg total) by mouth daily. Jon Billings, NP Taking Active   B-D UF III MINI PEN NEEDLES 31G X 5 MM MISC [id number] No USE DAILY Renato Shin, MD Taking Active   Continuous Blood Gluc Receiver (FREESTYLE LIBRE 14 DAY READER) [place] [id number] No Use to check glucose levels TID; E11.9 Brunetta Jeans, PA-C Taking Active   Continuous Blood Gluc Sensor (FREESTYLE LIBRE 14 DAY SENSOR) [place] [id number] No Use to check glucose TID. Change every 14 days. E 11.9 Brunetta Jeans, PA-C Taking Active   dapagliflozin propanediol (FARXIGA) 10 MG TABS tablet [id number] No Take 1 tablet (10 mg total) by mouth daily before breakfast. Jon Billings, NP Taking Active   diclofenac Sodium (VOLTAREN) 1 % GEL [id number] No Apply 2 g topically 4 (four) times daily. Jon Billings, NP Taking Active   Dulaglutide (TRULICITY) 7.06 CB/7.6EG SOPN [id number] No Inject 0.75 mg into the skin once a week.  Patient not taking: Reported on 09/07/2021   Renato Shin, MD Not Taking Active   fenofibrate (TRICOR) 145 MG tablet [id number] No Take 1 tablet (145 mg total) by mouth daily. Jon Billings, NP Taking Active   gabapentin (NEURONTIN) 100 MG capsule [id number] No  Take 1 capsule (100 mg total) by mouth 3 (three) times daily. Jon Billings, NP Taking Active   hydrochlorothiazide (HYDRODIURIL) 25 MG tablet [id number] No TAKE 1 TABLET BY MOUTH EVERY DAY Jon Billings, NP Taking Active   insulin aspart protamine- aspart (NOVOLOG MIX 70/30) (70-30) 100 UNIT/ML injection [id number] No Inject 200 Units into the skin daily with breakfast. [provider] Taking Active   venlafaxine XR (EFFEXOR XR) 75 MG 24 hr capsule [id number]  Take 1 capsule (75 mg total) by mouth daily with breakfast. Jon Billings, NP  Active  Patient Active Problem List   Diagnosis Date Noted   Depression    Nuclear sclerotic cataract of both eyes 09/07/2020   Horseshoe retinal tear, right eye 08/11/2020   Viral warts 06/07/2020   Diabetic macular edema of right eye with proliferative retinopathy associated with type 2 diabetes mellitus ([place]) 04/20/2020   Proliferative diabetic retinopathy of right eye ([place]) 03/02/2020   Type 2 diabetes mellitus with proliferative diabetic retinopathy of left eye without macular edema ([place]) 03/02/2020   Diabetic neuropathy associated with type 2 diabetes mellitus ([place]) 02/22/2020   Type 2 diabetes mellitus ([place]) 02/22/2020   Hypertension associated with diabetes ([place]) 02/22/2020   Hyperlipidemia associated with type 2 diabetes mellitus ([place]) 02/22/2020   Prostate cancer screening 02/22/2020   Plantar warts 02/22/2020    Immunization History  Administered Date(s) Administered   Fluad Quad(high Dose 65+) 06/24/2020, 08/01/2021   Moderna Sars-Covid-2 Vaccination 10/13/2019, 11/09/2019, 08/18/2020   Pfizer Covid-19 Vaccine Bivalent Booster 58yr & up 07/17/2021   Pneumococcal Conjugate-13 03/25/2020   Pneumococcal Polysaccharide-23 08/24/2010, 05/01/2021   Tdap 08/24/2010    Conditions to be addressed/monitored: HTN, HLD, DM2, depression, insomnia  Care Plan : [place] Updates made by PMadelin Rear  [facility]ess[facility] 09/26/2021 12:00 AM     Problem: HTN, HLD, DM2, depression, insomnia   Priority: High     Long-Range Goal: Disease Management   Start Date: 09/25/2021  This Visit's Progress: On track  Recent Progress: Not on track  Priority: High  Note:   Current Barriers:  Unable to independently afford treatment regimen Unable to achieve control of t2dm   Pharmacist Clinical Goal(s):  Patient will verbalize ability to afford treatment regimen achieve adherence to monitoring guidelines and medication adherence to achieve therapeutic efficacy contact provider office for questions/concerns as evidenced notation of same in electronic health record through collaboration with PharmD and provider.   Interventions: 1:1 collaboration with HJon Billings NP regarding development and update of comprehensive plan of care as evidenced by provider attestation and co-signature Inter-disciplinary care team collaboration (see longitudinal plan of care) Comprehensive medication review performed; medication list updated in electronic medical record  Hypertension (BP goal <130/80) -Controlled -Current treatment: Amlodipine 5 mg once daily Appropriate, Effective, Safe, Accessible HCTZ 25 mg once daily  Appropriate, Effective, Safe, Accessible -Current home readings: n/a -Current dietary habits: high salt, high carb -Denies hypotensive/hypertensive symptoms -Educated on BP goals and benefits of medications for prevention of heart attack, stroke and kidney damage; Importance of home blood pressure monitoring; Symptoms of hypotension and importance of maintaining adequate hydration; -Counseled to monitor BP at home 1-2x/wk, document, and provide log at future appointments -Counseled on diet and exercise extensively  Hyperlipidemia: (LDL goal < 100) -Not ideally controlled -Current treatment: Atorvastatin 80 mg once daily  Appropriate, Query effective, Safe, Accessible Fenofibrate 145 mg once  daily Appropriate, Query effective, Safe, Accessible -Medications previously tried: n/a  -Side effect treview - no problems noted  -Educated on Cholesterol goals;  Importance of limiting foods high in cholesterol; -Recommended to continue current medication  Diabetes (A1c goal <7%) -Controlled -[place] -Current medications: Novolin 75/25 200 units daily  Appropriate, Query effective, Safe, Accessible Trulicity 00.25mg SQ Once Weekly (not yet approved)  Appropriate, Query effective, Safe, Accessible Farxiga 10 mg once daily (PAP approved)  Appropriate, Query effective, Safe, Accessible -Medications previously tried: metformin, other insulins   -Current home glucose readings. AVG 190s through CBG (equiv to a1c in the 8-9% range) -  Denies hypoglycemic/hyperglycemic symptoms -Current meal patterns: breakfast: sausage, water or diet coke (a liter and a half/day). water late at night. Trying to cut down on bread which has been a major source of carb intake -Current exercise: minimal -Educated on A1c and blood sugar goals; Complications of diabetes including kidney damage, retinal damage, and cardiovascular disease; Proper insulin injection technique; Prevention and management of hypoglycemic episodes; Benefits of routine self-monitoring of blood sugar; Continuous glucose monitoring; -Recommended to continue current medication  Patient Goals/Self-Care Activities Patient will:  - take medications as prescribed as evidenced by patient report and record review collaborate with provider on medication access solutions target a minimum of 150 minutes of moderate intensity exercise weekly  Medication Assistance: Application for trulicity (endo)  medication assistance program. in process.  Anticipated assistance start date 10/2021.  See plan of care for additional detail.      Patient's preferred pharmacy is:  [id number] [place], [place]Phone: [phone]: [phone] [id number] [place] [address] [place] [id number]: [phone]: [phone] endorses 100% compliance  Follow Up:  Patient agrees to Care Plan and Follow-up. Plan: HC check on PAP for trulcity. Pharmacist 3 month telephone  Future Appointments  Date Time Provider De[place]1/17/2023  4:00 PM CFP CCM CASE MANAGER CFP-CFP PEC  10/02/2021  1:30 PM [place], GaClent DemarkMD RDE-RDE None  10/06/2021 11:15 AM ElRenato ShinMD LBPC-LBENDO None  10/09/2021 10:20 AM HoJon BillingsNP CFP-CFP PEC  10/23/2021  9:30 AM KoRalene BatheMD ASC-ASC None  12/25/2021  9:45 AM CFP CCM PHARMACY CFP-CFP PEC    JaMadelin RearPharmD, BCWahiawa General Hospitallinical Pharmacist  CrRamapo Ridge Psychiatric Hospitalractice  [phone]

## 2021-09-26 ENCOUNTER — Telehealth: Payer: Medicare Other

## 2021-09-26 ENCOUNTER — Ambulatory Visit: Payer: Self-pay

## 2021-09-26 DIAGNOSIS — I152 Hypertension secondary to endocrine disorders: Secondary | ICD-10-CM

## 2021-09-26 DIAGNOSIS — E1169 Type 2 diabetes mellitus with other specified complication: Secondary | ICD-10-CM

## 2021-09-26 DIAGNOSIS — N183 Chronic kidney disease, stage 3 unspecified: Secondary | ICD-10-CM

## 2021-09-26 DIAGNOSIS — Z794 Long term (current) use of insulin: Secondary | ICD-10-CM

## 2021-09-26 DIAGNOSIS — G479 Sleep disorder, unspecified: Secondary | ICD-10-CM

## 2021-09-26 DIAGNOSIS — F32A Depression, unspecified: Secondary | ICD-10-CM

## 2021-09-26 DIAGNOSIS — E1159 Type 2 diabetes mellitus with other circulatory complications: Secondary | ICD-10-CM

## 2021-09-26 NOTE — Chronic Care Management (AMB) (Signed)
Chronic Care Management   CCM RN Visit Note  09/26/2021 Name: Randel Hargens MRN: [id number] DOB: Sep 03, 1956  Subjective: Andrew Pruitt is a 66 y.o. year old male who is a primary care patient of Jon Billings, NP. The care management team was consulted for assistance with disease management and care coordination needs.    Engaged with patient by telephone for follow up visit in response to provider referral for case management and/or care coordination services.   Consent to Services:  The patient was given information about Chronic Care Management services, agreed to services, and gave verbal consent prior to initiation of services.  Please see initial visit note for detailed documentation.   Patient agreed to services and verbal consent obtained.   Assessment: Review of patient past medical history, allergies, medications, health status, including review of consultants reports, laboratory and other test data, was performed as part of comprehensive evaluation and provision of chronic care management services.   SDOH (Social Determinants of Health) assessments and interventions performed:    CCM Care Plan  Allergies  Allergen Reactions   Niaspan [Niacin] Other (See Comments)    Red skin, red eyes   Trazodone Other (See Comments)    nightmares    Outpatient Encounter Medications as of 09/26/2021  Medication Sig   amLODipine (NORVASC) 5 MG tablet TAKE 1 TABLET BY MOUTH DAILY. FURTHER REFILLS WILL NEED TO COME FROM NEW PRIMARY CARE PROVIDER   atorvastatin (LIPITOR) 80 MG tablet Take 1 tablet (80 mg total) by mouth daily.   B-D UF III MINI PEN NEEDLES 31G X 5 MM MISC USE DAILY   Continuous Blood Gluc Receiver (FREESTYLE LIBRE 14 DAY READER) DEVI Use to check glucose levels TID; E11.9   Continuous Blood Gluc Sensor (FREESTYLE LIBRE 14 DAY SENSOR) MISC Use to check glucose TID. Change every 14 days. E 11.9   dapagliflozin propanediol (FARXIGA) 10 MG TABS tablet Take 1 tablet (10 mg  total) by mouth daily before breakfast.   diclofenac Sodium (VOLTAREN) 1 % GEL Apply 2 g topically 4 (four) times daily.   Dulaglutide (TRULICITY) 4.78 GN/5.6OZ SOPN Inject 0.75 mg into the skin once a week. (Patient not taking: Reported on 09/07/2021)   fenofibrate (TRICOR) 145 MG tablet Take 1 tablet (145 mg total) by mouth daily.   gabapentin (NEURONTIN) 100 MG capsule Take 1 capsule (100 mg total) by mouth 3 (three) times daily.   hydrochlorothiazide (HYDRODIURIL) 25 MG tablet TAKE 1 TABLET BY MOUTH EVERY DAY   insulin aspart protamine- aspart (NOVOLOG MIX 70/30) (70-30) 100 UNIT/ML injection Inject 200 Units into the skin daily with breakfast.   venlafaxine XR (EFFEXOR XR) 75 MG 24 hr capsule Take 1 capsule (75 mg total) by mouth daily with breakfast.   No facility-administered encounter medications on file as of 09/26/2021.    Patient Active Problem List   Diagnosis Date Noted   Depression    Nuclear sclerotic cataract of both eyes 09/07/2020   Horseshoe retinal tear, right eye 08/11/2020   Viral warts 06/07/2020   Diabetic macular edema of right eye with proliferative retinopathy associated with type 2 diabetes mellitus ([place]) 04/20/2020   Proliferative diabetic retinopathy of right eye ([place]) 03/02/2020   Type 2 diabetes mellitus with proliferative diabetic retinopathy of left eye without macular edema ([place]) 03/02/2020   Diabetic neuropathy associated with type 2 diabetes mellitus ([place]) 02/22/2020   Type 2 diabetes mellitus ([place]) 02/22/2020   Hypertension associated with diabetes ([place]) 02/22/2020   Hyperlipidemia associated with type 2  diabetes mellitus ([place]) 02/22/2020   Prostate cancer screening 02/22/2020   Plantar warts 02/22/2020    Conditions to be addressed/monitored:HTN, HLD, DMII, Depression, and Insomnia  Care Plan : RNCM: General Plan of Care (Adult) for Chronic Disease Management and Care Coordination Needs  Updates made by Vanita Ingles, RN since 09/26/2021 12:00 AM      Problem: RNCM: Development of Plan of Care for Chronic Disease Management(DM, HTN, HLD, Depression, Insomnia)   Priority: High     Long-Range Goal: RNCM: Effective Management  of Plan of Care for Chronic Disease Management(DM, HTN, HLD, Depression, Insomnia)   Start Date: 07/31/2021  Expected End Date: 07/31/2022  Priority: High  Note:   Current Barriers:  Knowledge Deficits related to plan of care for management of HTN, HLD, DMII, and Depression: depressed mood anxiety insomnia  Chronic Disease Management support and education needs related to HTN, HLD, DMII, and Depression: depressed mood anxiety insomnia Financial Constraints.   RNCM Clinical Goal(s):  Patient will verbalize basic understanding of HTN, HLD, DMII, Depression, and insomnia  disease process and self health management plan as evidenced by compliance with the plan of care, following dietary restrictions, and working with CCM team to effectively manage health and well being  take all medications exactly as prescribed and will call provider for medication related questions as evidenced by taking medications as prescribed and calling for refills before running out of medications     attend all scheduled medical appointments: 10-09-2021 at 1020 am as evidenced by keeping appointments and calling the office for needed appointment changes         demonstrate improved and ongoing health management independence as evidenced by compliance with plan of care, calling the office for changes, and working with the CCM team to optimize health and well being.        demonstrate a decrease in HTN, HLD, DMII, Depression, and insomnia  exacerbations  as evidenced by discussing concerns with the provider and working with the CCM team for ongoing support and education demonstrate ongoing self health care management ability for effective management of chronic conditions  as evidenced by  working with the CCM team through collaboration with Midwife, provider, and care team.   Interventions: 1:1 collaboration with primary care provider regarding development and update of comprehensive plan of care as evidenced by provider attestation and co-signature Inter-disciplinary care team collaboration (see longitudinal plan of care) Evaluation of current treatment plan related to  self management and patient's adherence to plan as established by provider   Diabetes:  (Status: Goal on Track (progressing): YES.) Long Term Goal   Lab Results  Component Value Date   HGBA1C 10.9 (H) 08/01/2021  Assessed patient's understanding of A1c goal: <7% Provided education to patient about basic DM disease process; Reviewed medications with patient and discussed importance of medication adherence. 07-31-2021: Is working with the endocrinologist office to see about getting assistance with medications to help control his DM. 09-26-2021: Is working with endocrinologist and pharm D on getting assistance for help with medications- has Fargixa and waiting to hear back on Trulicity;        Reviewed prescribed diet with patient heart healthy/ADA diet. 09-26-2021: Review with the patient. The patient is monitoring his dietary habits and what he is eating; Counseled on importance of regular laboratory monitoring as prescribed;        Discussed plans with patient for ongoing care management follow up and provided patient with direct contact information for care  management team;      Provided patient with written educational materials related to hypo and hyperglycemia and importance of correct treatment. 09-26-2021: Denies any lows or highs greater than 200 now. States the highest he has seen is 190's. ;       Reviewed scheduled/upcoming provider appointments including: 10-09-2021 at 1020 am with the pcp, saw endocrinologist recently, saw kidney specialist recently and got a good report.  ;         Advised patient, providing education and rationale, to check cbg  before meals and at bedtime, when you have symptoms of low or high blood sugar, and before and after exercise and record. 09-26-2021: The patient states he is seeing numbers in the 120's now and his blood sugars are coming down. Sometimes it may go up to 190's but it is a lot better than what it was. A1C is trending down. Education and support given.     call provider for findings outside established parameters;       Referral made to pharmacy team for assistance with medications cost constraints and management ;       Review of patient status, including review of consultants reports, relevant laboratory and other test results, and medications completed;        Depression and Insomnia  (Status: Goal on Track (progressing): YES.) Long Term Goal  Evaluation of current treatment plan related to Depression and Insomnia  , Mental Health Concerns  self-management and patient's adherence to plan as established by provider. 09-26-2021: The patient states he had to stop taking the trazodone as he was having bad nightmares so he stopped it altogether. Will discuss other options with the pcp at appointment on 10-09-2021.  The patient and his wife were in [place] looking at houses. The plan is to purchase a house in [place] and move back to [place]. The patient states originally he and his wife moved to [place] because his daughter was going to help him and his wife get a house. That is not how it worked out so they are moving back to [place] where they have other children and family.  The patient is happy with the decision. He states he will miss the people in [place] and the healthcare but he will be happy to be back home. Plans on keeping follow up appointments and will let the staff know when he is moving for good.  Discussed plans with patient for ongoing care management follow up and provided patient with direct contact information for care management team Advised patient to discuss his depression and insomnia with the  pcp tomorrow. The patient states that his wife says he is depressed. He says he is only sleeping about 3 hours a night. 09-26-2021: The patient states he will discuss with the pcp other options. The patient states he is doing well with the Effexor, denies any new concerns; Provided education to patient re: sleep hygiene, healthy sleep habits, pacing activity; Reviewed scheduled/upcoming provider appointments including 10-09-2021 with 1020 am with the pcp ; Discussed plans with patient for ongoing care management follow up and provided patient with direct contact information for care management team; Advised patient to discuss medication options that would help with sleep with provider; Screening for signs and symptoms of depression related to chronic disease state;  Assessed social determinant of health barriers;   Hyperlipidemia:  (Status: Goal on Track (progressing): YES.) Long Term Goal  Lab Results  Component Value Date   CHOL 229 (H) 08/01/2021  HDL 24 (L) 08/01/2021   LDLCALC 106 (H) 08/01/2021   LDLDIRECT 81.0 02/11/2020   TRIG 574 (HH) 08/01/2021   CHOLHDL 9.5 (H) 08/01/2021     Medication review performed; medication list updated in electronic medical record.  Provider established cholesterol goals reviewed; Counseled on importance of regular laboratory monitoring as prescribed; Provided HLD educational materials; Reviewed role and benefits of statin for ASCVD risk reduction; Discussed strategies to manage statin-induced myalgias; Reviewed importance of limiting foods high in cholesterol. 09-26-2021: The patient is monitoring dietary intake. Reviewed with the patient heart healthy/ADA diet  Reviewed exercise goals and target of 150 minutes per week;  Hypertension: (Status: Goal on Track (progressing): YES. Goal Not Met.) Last practice recorded BP readings:  BP Readings from Last 3 Encounters:  09/07/21 123/73  08/01/21 127/75  07/28/21 (!) 142/80  Most recent eGFR/CrCl:  Lab  Results  Component Value Date   EGFR 36 (L) 08/01/2021    No components found for: CRCL  Evaluation of current treatment plan related to hypertension self management and patient's adherence to plan as established by provider. 09-26-2021: The patient denies any issues with HTN or heart health. Will continue to monitor.    Provided education to patient re: stroke prevention, s/s of heart attack and stroke; Reviewed prescribed diet heart healthy/ADA diet. 09-26-2021: Review of heart healthy/ADA diet  Reviewed medications with patient and discussed importance of compliance;  Discussed plans with patient for ongoing care management follow up and provided patient with direct contact information for care management team; Advised patient, providing education and rationale, to monitor blood pressure daily and record, calling PCP for findings outside established parameters;  Advised patient to discuss blood pressure trends  with provider; Provided education on prescribed diet heart healthy/ADA diet ;  Discussed complications of poorly controlled blood pressure such as heart disease, stroke, circulatory complications, vision complications, kidney impairment, sexual dysfunction;   Patient Goals/Self-Care Activities: Take medications as prescribed   Attend all scheduled provider appointments Call pharmacy for medication refills 3-7 days in advance of running out of medications Attend church or other social activities Perform all self care activities independently  Perform IADL's (shopping, preparing meals, housekeeping, managing finances) independently Call provider office for new concerns or questions  Work with the social worker to address care coordination needs and will continue to work with the clinical team to address health care and disease management related needs call 911 call the Suicide and Crisis Lifeline: 988 call the [place] National Suicide Prevention Lifeline: 857-325-8805 call  1-800-273-TALK (toll free, 24 hour hotline) if experiencing a Mental Health or [place]  keep appointment with eye doctor check blood sugar at prescribed times: before meals and at bedtime, when you have symptoms of low or high blood sugar, and before and after exercise check feet daily for cuts, sores or redness enter blood sugar readings and medication or insulin into daily log take the blood sugar log to all doctor visits trim toenails straight across drink 6 to 8 glasses of water each day eat fish at least once per week fill half of plate with vegetables limit fast food meals to no more than 1 per week manage portion size prepare main meal at home 3 to 5 days each week read food labels for fat, fiber, carbohydrates and portion size reduce red meat to 2 to 3 times a week set a realistic goal do heel pump exercise 2 to 3 times each day keep feet up while sitting wash and dry  feet carefully every day wear comfortable, cotton socks wear comfortable, well-fitting shoes check blood pressure weekly choose a place to take my blood pressure (home, clinic or office, retail store) learn about high blood pressure take blood pressure log to all doctor appointments call doctor for signs and symptoms of high blood pressure develop an action plan for high blood pressure keep all doctor appointments take medications for blood pressure exactly as prescribed report new symptoms to your doctor eat more whole grains, fruits and vegetables, lean meats and healthy fats - call for medicine refill 2 or 3 days before it runs out - take all medications exactly as prescribed - call doctor with any symptoms you believe are related to your medicine - call doctor when you experience any new symptoms - go to all doctor appointments as scheduled - adhere to prescribed diet: heart healthy/ADA diet        Plan:Telephone follow up appointment with care management team member scheduled for:   10-20-2021 at 145 pm  Bruce, MSN, [facility] Mobile: [phone]

## 2021-09-26 NOTE — Patient Instructions (Signed)
Visit Information  Thank you for taking time to visit with me today. Please don't hesitate to contact me if I can be of assistance to you before our next scheduled telephone appointment.  Following are the goals we discussed today:  RNCM Clinical Goal(s):  Patient will verbalize basic understanding of HTN, HLD, DMII, Depression, and insomnia  disease process and self health management plan as evidenced by compliance with the plan of care, following dietary restrictions, and working with CCM team to effectively manage health and well being  take all medications exactly as prescribed and will call provider for medication related questions as evidenced by taking medications as prescribed and calling for refills before running out of medications     attend all scheduled medical appointments: 10-09-2021 at 1020 am as evidenced by keeping appointments and calling the office for needed appointment changes         demonstrate improved and ongoing health management independence as evidenced by compliance with plan of care, calling the office for changes, and working with the CCM team to optimize health and well being.        demonstrate a decrease in HTN, HLD, DMII, Depression, and insomnia  exacerbations  as evidenced by discussing concerns with the provider and working with the CCM team for ongoing support and education demonstrate ongoing self health care management ability for effective management of chronic conditions  as evidenced by  working with the CCM team through collaboration with Consulting civil engineer, provider, and care team.    Interventions: 1:1 collaboration with primary care provider regarding development and update of comprehensive plan of care as evidenced by provider attestation and co-signature Inter-disciplinary care team collaboration (see longitudinal plan of care) Evaluation of current treatment plan related to  self management and patient's adherence to plan as established by provider      Diabetes:  (Status: Goal on Track (progressing): YES.) Long Term Goal         Lab Results  Component Value Date    HGBA1C 10.9 (H) 08/01/2021  Assessed patient's understanding of A1c goal: <7% Provided education to patient about basic DM disease process; Reviewed medications with patient and discussed importance of medication adherence. 07-31-2021: Is working with the endocrinologist office to see about getting assistance with medications to help control his DM. 09-26-2021: Is working with endocrinologist and pharm D on getting assistance for help with medications- has Fargixa and waiting to hear back on Trulicity;        Reviewed prescribed diet with patient heart healthy/ADA diet. 09-26-2021: Review with the patient. The patient is monitoring his dietary habits and what he is eating; Counseled on importance of regular laboratory monitoring as prescribed;        Discussed plans with patient for ongoing care management follow up and provided patient with direct contact information for care management team;      Provided patient with written educational materials related to hypo and hyperglycemia and importance of correct treatment. 09-26-2021: Denies any lows or highs greater than 200 now. States the highest he has seen is 190's. ;       Reviewed scheduled/upcoming provider appointments including: 10-09-2021 at 1020 am with the pcp, saw endocrinologist recently, saw kidney specialist recently and got a good report.  ;         Advised patient, providing education and rationale, to check cbg before meals and at bedtime, when you have symptoms of low or high blood sugar, and before and after exercise and record.  09-26-2021: The patient states he is seeing numbers in the 120's now and his blood sugars are coming down. Sometimes it may go up to 190's but it is a lot better than what it was. A1C is trending down. Education and support given.     call provider for findings outside established parameters;        Referral made to pharmacy team for assistance with medications cost constraints and management ;       Review of patient status, including review of consultants reports, relevant laboratory and other test results, and medications completed;         Depression and Insomnia  (Status: Goal on Track (progressing): YES.) Long Term Goal  Evaluation of current treatment plan related to Depression and Insomnia  , Mental Health Concerns  self-management and patient's adherence to plan as established by provider. 09-26-2021: The patient states he had to stop taking the trazodone as he was having bad nightmares so he stopped it altogether. Will discuss other options with the pcp at appointment on 10-09-2021.  The patient and his wife were in [place] looking at houses. The plan is to purchase a house in [place] and move back to [place]. The patient states originally he and his wife moved to [place] because his daughter was going to help him and his wife get a house. That is not how it worked out so they are moving back to [place] where they have other children and family.  The patient is happy with the decision. He states he will miss the people in [place] and the healthcare but he will be happy to be back home. Plans on keeping follow up appointments and will let the staff know when he is moving for good.  Discussed plans with patient for ongoing care management follow up and provided patient with direct contact information for care management team Advised patient to discuss his depression and insomnia with the pcp tomorrow. The patient states that his wife says he is depressed. He says he is only sleeping about 3 hours a night. 09-26-2021: The patient states he will discuss with the pcp other options. The patient states he is doing well with the Effexor, denies any new concerns; Provided education to patient re: sleep hygiene, healthy sleep habits, pacing activity; Reviewed scheduled/upcoming provider appointments including  10-09-2021 with 1020 am with the pcp ; Discussed plans with patient for ongoing care management follow up and provided patient with direct contact information for care management team; Advised patient to discuss medication options that would help with sleep with provider; Screening for signs and symptoms of depression related to chronic disease state;  Assessed social determinant of health barriers;    Hyperlipidemia:  (Status: Goal on Track (progressing): YES.) Long Term Goal       Lab Results  Component Value Date    CHOL 229 (H) 08/01/2021    HDL 24 (L) 08/01/2021    LDLCALC 106 (H) 08/01/2021    LDLDIRECT 81.0 02/11/2020    TRIG 574 (HH) 08/01/2021    CHOLHDL 9.5 (H) 08/01/2021      Medication review performed; medication list updated in electronic medical record.  Provider established cholesterol goals reviewed; Counseled on importance of regular laboratory monitoring as prescribed; Provided HLD educational materials; Reviewed role and benefits of statin for ASCVD risk reduction; Discussed strategies to manage statin-induced myalgias; Reviewed importance of limiting foods high in cholesterol. 09-26-2021: The patient is monitoring dietary intake. Reviewed with the patient heart healthy/ADA diet  Reviewed exercise goals and target of 150 minutes per week;   Hypertension: (Status: Goal on Track (progressing): YES. Goal Not Met.) Last practice recorded BP readings:     BP Readings from Last 3 Encounters:  09/07/21 123/73  08/01/21 127/75  07/28/21 (!) 142/80  Most recent eGFR/CrCl:       Lab Results  Component Value Date    EGFR 36 (L) 08/01/2021    No components found for: CRCL   Evaluation of current treatment plan related to hypertension self management and patient's adherence to plan as established by provider. 09-26-2021: The patient denies any issues with HTN or heart health. Will continue to monitor.    Provided education to patient re: stroke prevention, s/s of heart  attack and stroke; Reviewed prescribed diet heart healthy/ADA diet. 09-26-2021: Review of heart healthy/ADA diet  Reviewed medications with patient and discussed importance of compliance;  Discussed plans with patient for ongoing care management follow up and provided patient with direct contact information for care management team; Advised patient, providing education and rationale, to monitor blood pressure daily and record, calling PCP for findings outside established parameters;  Advised patient to discuss blood pressure trends  with provider; Provided education on prescribed diet heart healthy/ADA diet ;  Discussed complications of poorly controlled blood pressure such as heart disease, stroke, circulatory complications, vision complications, kidney impairment, sexual dysfunction;    Patient Goals/Self-Care Activities: Take medications as prescribed   Attend all scheduled provider appointments Call pharmacy for medication refills 3-7 days in advance of running out of medications Attend church or other social activities Perform all self care activities independently  Perform IADL's (shopping, preparing meals, housekeeping, managing finances) independently Call provider office for new concerns or questions  Work with the social worker to address care coordination needs and will continue to work with the clinical team to address health care and disease management related needs call 911 call the Suicide and Crisis Lifeline: 988 call the [place] National Suicide Prevention Lifeline: 515-507-2064 call 1-800-273-TALK (toll free, 24 hour hotline) if experiencing a Mental Health or [place]  keep appointment with eye doctor check blood sugar at prescribed times: before meals and at bedtime, when you have symptoms of low or high blood sugar, and before and after exercise check feet daily for cuts, sores or redness enter blood sugar readings and medication or insulin into daily  log take the blood sugar log to all doctor visits trim toenails straight across drink 6 to 8 glasses of water each day eat fish at least once per week fill half of plate with vegetables limit fast food meals to no more than 1 per week manage portion size prepare main meal at home 3 to 5 days each week read food labels for fat, fiber, carbohydrates and portion size reduce red meat to 2 to 3 times a week set a realistic goal do heel pump exercise 2 to 3 times each day keep feet up while sitting wash and dry feet carefully every day wear comfortable, cotton socks wear comfortable, well-fitting shoes check blood pressure weekly choose a place to take my blood pressure (home, clinic or office, retail store) learn about high blood pressure take blood pressure log to all doctor appointments call doctor for signs and symptoms of high blood pressure develop an action plan for high blood pressure keep all doctor appointments take medications for blood pressure exactly as prescribed report new symptoms to your doctor eat more whole grains, fruits and vegetables, lean  meats and healthy fats - call for medicine refill 2 or 3 days before it runs out - take all medications exactly as prescribed - call doctor with any symptoms you believe are related to your medicine - call doctor when you experience any new symptoms - go to all doctor appointments as scheduled - adhere to prescribed diet: heart healthy/ADA diet       Our next appointment is by telephone on 10-20-2021 at 145 pm  Please call the care guide team at [phone] if you need to cancel or reschedule your appointment.   If you are experiencing a Mental Health or [place] or need someone to talk to, please call the Suicide and Crisis Lifeline: 988 call the [place] National Suicide Prevention Lifeline: (763)224-8489 or TTY: 819-475-2799 TTY 640-435-8911) to talk to a trained counselor call 1-800-273-TALK (toll free,  24 hour hotline)   Patient verbalizes understanding of instructions and care plan provided today and agrees to view in [place]. Active MyChart status confirmed with patient.    Noreene Larsson RN, MSN, [facility] Mobile: [phone]

## 2021-09-28 ENCOUNTER — Other Ambulatory Visit: Payer: Self-pay | Admitting: Nurse Practitioner

## 2021-09-28 NOTE — Telephone Encounter (Signed)
Requested medication (s) are due for refill today: No  Requested medication (s) are on the active medication list: Yes  Last refill:  09/07/21  Future visit scheduled: Yes  Notes to clinic:  Pharmacy requesting 90 day supply.    Requested Prescriptions  Pending Prescriptions Disp Refills   venlafaxine XR (EFFEXOR-XR) 75 MG 24 hr capsule [Pharmacy Med Name: VENLAFAXINE HCL ER 75 MG CAP] 90 capsule 1    Sig: TAKE 1 CAPSULE BY MOUTH DAILY WITH BREAKFAST.     Psychiatry: Antidepressants - SNRI - desvenlafaxine & venlafaxine Failed - 09/28/2021 10:27 AM      Failed - LDL in normal range and within 360 days    LDL Chol Calc (NIH)  Date Value Ref Range Status  08/01/2021 106 (H) 0 - 99 mg/dL Final   Direct LDL  Date Value Ref Range Status  02/11/2020 81.0 mg/dL Final    Comment:    Optimal:  <100 mg/dLNear or Above Optimal:  100-129 mg/dLBorderline High:  130-159 mg/dLHigh:  160-189 mg/dLVery High:  >190 mg/dL          Failed - Total Cholesterol in normal range and within 360 days    Cholesterol, Total  Date Value Ref Range Status  08/01/2021 229 (H) 100 - 199 mg/dL Final          Failed - Triglycerides in normal range and within 360 days    Triglycerides  Date Value Ref Range Status  08/01/2021 574 (HH) 0 - 149 mg/dL Final          Passed - Completed PHQ-2 or PHQ-9 in the last 360 days      Passed - Last BP in normal range    BP Readings from Last 1 Encounters:  09/07/21 123/73          Passed - Valid encounter within last 6 months    Recent Outpatient Visits           3 weeks ago Depression, unspecified depression type   [facility] Jon Billings, NP   1 month ago Hypertension associated with diabetes ([place])   [facility] Jon Billings, NP   5 months ago Uncontrolled type II diabetes mellitus with chronic kidney disease ([place])   [facility] Jon Billings, NP   8 months ago Hypertension associated with diabetes  [facility])   [facility] Jon Billings, NP   9 months ago Hypertension associated with diabetes [facility])   [place] Jon Billings, NP       Future Appointments             In 1 week Jon Billings, NP [facility], [place]   In 3 weeks Ralene Bathe, MD [place]

## 2021-10-02 ENCOUNTER — Other Ambulatory Visit: Payer: Self-pay

## 2021-10-02 ENCOUNTER — Ambulatory Visit (INDEPENDENT_AMBULATORY_CARE_PROVIDER_SITE_OTHER): Payer: Medicare Other | Admitting: Ophthalmology

## 2021-10-02 ENCOUNTER — Telehealth: Payer: Self-pay | Admitting: Endocrinology

## 2021-10-02 ENCOUNTER — Encounter (INDEPENDENT_AMBULATORY_CARE_PROVIDER_SITE_OTHER): Payer: Self-pay | Admitting: Ophthalmology

## 2021-10-02 DIAGNOSIS — E113592 Type 2 diabetes mellitus with proliferative diabetic retinopathy without macular edema, left eye: Secondary | ICD-10-CM

## 2021-10-02 DIAGNOSIS — E113511 Type 2 diabetes mellitus with proliferative diabetic retinopathy with macular edema, right eye: Secondary | ICD-10-CM | POA: Diagnosis not present

## 2021-10-02 NOTE — Assessment & Plan Note (Signed)
OD, severe peripheral retinal nonperfusion, will likely need FFA OD next and likely need PRP peripherally

## 2021-10-02 NOTE — Progress Notes (Signed)
10/02/2021     CHIEF COMPLAINT Patient presents for  Chief Complaint  Patient presents with   Diabetic Retinopathy with Macular Edema      HISTORY OF PRESENT ILLNESS: Andrew Pruitt is a 66 y.o. male who presents to the clinic today for:   HPI   History of advanced diabetic retinopathy OU, and macular edema Under control, last therapeutic injection was 15 months previous Last edited by Hurman Horn, MD on 10/02/2021  1:41 PM.      Referring physician: Warden Fillers, MD [address] [place],  [place] [id number]  HISTORICAL INFORMATION:   Selected notes from the MEDICAL RECORD NUMBER    Lab Results  Component Value Date   HGBA1C 10.9 (H) 08/01/2021     CURRENT MEDICATIONS: No current outpatient medications on file. (Ophthalmic Drugs)   No current facility-administered medications for this visit. (Ophthalmic Drugs)   Current Outpatient Medications (Other)  Medication Sig   amLODipine (NORVASC) 5 MG tablet TAKE 1 TABLET BY MOUTH DAILY. FURTHER REFILLS WILL NEED TO COME FROM NEW PRIMARY CARE PROVIDER   atorvastatin (LIPITOR) 80 MG tablet Take 1 tablet (80 mg total) by mouth daily.   B-D UF III MINI PEN NEEDLES 31G X 5 MM MISC USE DAILY   Continuous Blood Gluc Receiver (FREESTYLE LIBRE 14 DAY READER) DEVI Use to check glucose levels TID; E11.9   Continuous Blood Gluc Sensor (FREESTYLE LIBRE 14 DAY SENSOR) MISC Use to check glucose TID. Change every 14 days. E 11.9   dapagliflozin propanediol (FARXIGA) 10 MG TABS tablet Take 1 tablet (10 mg total) by mouth daily before breakfast.   diclofenac Sodium (VOLTAREN) 1 % GEL Apply 2 g topically 4 (four) times daily.   Dulaglutide (TRULICITY) 2.84 XL/2.4MW SOPN Inject 0.75 mg into the skin once a week. (Patient not taking: Reported on 09/07/2021)   fenofibrate (TRICOR) 145 MG tablet Take 1 tablet (145 mg total) by mouth daily.   gabapentin (NEURONTIN) 100 MG capsule Take 1 capsule (100 mg total) by mouth 3 (three)  times daily.   hydrochlorothiazide (HYDRODIURIL) 25 MG tablet TAKE 1 TABLET BY MOUTH EVERY DAY   insulin aspart protamine- aspart (NOVOLOG MIX 70/30) (70-30) 100 UNIT/ML injection Inject 200 Units into the skin daily with breakfast.   venlafaxine XR (EFFEXOR XR) 75 MG 24 hr capsule Take 1 capsule (75 mg total) by mouth daily with breakfast.   No current facility-administered medications for this visit. (Other)      REVIEW OF SYSTEMS:    ALLERGIES Allergies  Allergen Reactions   Niaspan [Niacin] Other (See Comments)    Red skin, red eyes   Trazodone Other (See Comments)    nightmares    PAST MEDICAL HISTORY Past Medical History:  Diagnosis Date   Arthritis    Depression    Diabetes mellitus without complication ([place])    Hyperlipidemia    Hypertension    Past Surgical History:  Procedure Laterality Date   CATARACT EXTRACTION Right    CATARACT EXTRACTION Left    TONSILLECTOMY      FAMILY HISTORY Family History  Problem Relation Age of Onset   Cancer Mother        Uterine    Early death Mother    Stroke Mother    Diabetes Father    Heart attack Father    Heart disease Father    Hyperlipidemia Father    Diabetes Brother    Alzheimer's disease Maternal Grandmother    Kidney  failure Maternal Grandfather    Diabetes Paternal Grandfather    Diabetes Brother    Diabetes Brother    Colon cancer Neg Hx    Pancreatic cancer Neg Hx    Esophageal cancer Neg Hx     SOCIAL HISTORY Social History   Tobacco Use   Smoking status: Never   Smokeless tobacco: Never  Vaping Use   Vaping Use: Never used  Substance Use Topics   Alcohol use: Not Currently   Drug use: Never         OPHTHALMIC EXAM:  Base Eye Exam     Visual Acuity (ETDRS)       Right Left   Dist [place] 20/25 -1 20/30   Dist ph [place]  20/25         Tonometry (Tonopen, 1:40 PM)       Right Left   Pressure 16 10         Pupils       Pupils APD   Right PERRL None   Left PERRL None          Visual Fields       Left Right    Full Full         Extraocular Movement       Right Left    Full, Ortho Full, Ortho         Neuro/Psych     Oriented x3: Yes   Mood/Affect: Normal         Dilation     Both eyes: 1.0% Mydriacyl, 2.5% Phenylephrine @ 1:39 PM           Slit Lamp and Fundus Exam     External Exam       Right Left   External Normal Normal         Slit Lamp Exam       Right Left   Lids/Lashes Normal Normal   Conjunctiva/Sclera White and quiet White and quiet   Cornea Clear Clear   Anterior Chamber Deep and quiet Deep and quiet   Iris Round and reactive Round and reactive   Lens 2+ Nuclear sclerosis 2+ Nuclear sclerosis   Anterior Vitreous no cells no cells         Fundus Exam       Right Left   Posterior Vitreous Posterior vitreous detachment Posterior vitreous detachment   Disc Normal Normal   C/D Ratio 0.4 0.45   Macula Microaneurysms,  , no exudates, no clinically significant macular edema, no macular thickening Microaneurysms, no macular thickening   Vessels NPDR-Severe NPDR-Severe   Periphery horseshoe retinal tear 7:00 with good retinopexy surrounding Normal, good PRP anterior,             IMAGING AND PROCEDURES  Imaging and Procedures for 10/02/21  OCT, Retina - OU - Both Eyes       Right Eye Quality was good. Scan locations included subfoveal. Central Foveal Thickness: 300. Progression has been stable. Findings include abnormal foveal contour.   Left Eye Quality was good. Scan locations included subfoveal. Central Foveal Thickness: 301. Progression has been stable. Findings include abnormal foveal contour.   Notes No active CSME at this time yet with poor blood sugar control and history of CSME and underlying severe NPDR will need to follow-up in 4 months               ASSESSMENT/PLAN:  Type 2 diabetes mellitus with proliferative diabetic retinopathy of left eye without macular edema  (  HCC) Severe NPDR, with demonstrated capillary dropout peripherally  Will likely need antivegF and/or combination antivegF and peripheral anterior PRP however patient is moving to [place] in the near future, within 3 months.  Will schedule follow-up here in 2 months for FFA OU possible PRP OD  Diabetic macular edema of right eye with proliferative retinopathy associated with type 2 diabetes mellitus (HCC) OD, severe peripheral retinal nonperfusion, will likely need FFA OD next and likely need PRP peripherally     ICD-10-CM   1. Diabetic macular edema of right eye with proliferative retinopathy associated with type 2 diabetes mellitus (HCC)  E11.3511 OCT, Retina - OU - Both Eyes    2. Type 2 diabetes mellitus with left eye affected by proliferative retinopathy without macular edema, without long-term current use of insulin (HCC)  E11.3592 OCT, Retina - OU - Both Eyes    3. Type 2 diabetes mellitus with proliferative diabetic retinopathy of left eye without macular edema, unspecified whether long term insulin use ([place])  Y07.3710       1.  OU, now pseudophakic looks great with good acuity  2.  OU with advanced NPDR and PDR OD.  Each eye will need fluorescein angiography to map and monitor peripheral retinal nonperfusion and likely deliver PRP peripherally because of high risk of progression to PDR  3.  Patient to be seen in [place] in the coming months upon his move  Ophthalmic Meds Ordered this visit:  No orders of the defined types were placed in this encounter.      Return in about 2 months (around 11/30/2021) for DILATE OU, COLOR FP, OCT, OPTOS FFA R/L.  There are no Patient Instructions on file for this visit.   Explained the diagnoses, plan, and follow up with the patient and they expressed understanding.  Patient expressed understanding of the importance of proper follow up care.   Clent Demark Kelsie Zaborowski M.D. Diseases & Surgery of the Retina and Vitreous Retina  & Diabetic [place] 10/02/21     Abbreviations: M myopia (nearsighted); A astigmatism; H hyperopia (farsighted); P presbyopia; Mrx spectacle prescription;  CTL contact lenses; OD right eye; OS left eye; OU both eyes  XT exotropia; ET esotropia; PEK punctate epithelial keratitis; PEE punctate epithelial erosions; DES dry eye syndrome; MGD meibomian gland dysfunction; ATs artificial tears; PFAT's preservative free artificial tears; [place] nuclear sclerotic cataract; PSC posterior subcapsular cataract; ERM epi-retinal membrane; PVD posterior vitreous detachment; RD retinal detachment; DM diabetes mellitus; DR diabetic retinopathy; NPDR non-proliferative diabetic retinopathy; PDR proliferative diabetic retinopathy; CSME clinically significant macular edema; DME diabetic macular edema; dbh dot blot hemorrhages; CWS cotton wool spot; POAG primary open angle glaucoma; C/D cup-to-disc ratio; HVF humphrey visual field; GVF goldmann visual field; OCT optical coherence tomography; IOP intraocular pressure; BRVO Branch retinal vein occlusion; CRVO central retinal vein occlusion; CRAO central retinal artery occlusion; BRAO branch retinal artery occlusion; RT retinal tear; SB scleral buckle; PPV pars plana vitrectomy; VH Vitreous hemorrhage; PRP panretinal laser photocoagulation; IVK intravitreal kenalog; VMT vitreomacular traction; MH Macular hole;  NVD neovascularization of the disc; NVE neovascularization elsewhere; AREDS age related eye disease study; ARMD age related macular degeneration; POAG primary open angle glaucoma; EBMD epithelial/anterior basement membrane dystrophy; ACIOL anterior chamber intraocular lens; IOL intraocular lens; PCIOL posterior chamber intraocular lens; Phaco/IOL phacoemulsification with intraocular lens placement; [place] photorefractive keratectomy; LASIK laser assisted in situ keratomileusis; HTN hypertension; DM diabetes mellitus; COPD chronic obstructive pulmonary disease

## 2021-10-02 NOTE — Telephone Encounter (Signed)
Patient call for Novolin N refill that does not appear under his medication list.  [id number] Andrew Pruitt [address] Phone:  [phone]  Fax:  [phone]    Please confirmation to patient via My Chart. Patient dropped off patient assistant program documentation and is requesting an update.

## 2021-10-02 NOTE — Assessment & Plan Note (Signed)
Severe NPDR, with demonstrated capillary dropout peripherally  Will likely need antivegF and/or combination antivegF and peripheral anterior PRP however patient is moving to [place] in the near future, within 3 months.  Will schedule follow-up here in 2 months for FFA OU possible PRP OD

## 2021-10-03 ENCOUNTER — Other Ambulatory Visit (HOSPITAL_COMMUNITY): Payer: Self-pay

## 2021-10-04 ENCOUNTER — Other Ambulatory Visit: Payer: Self-pay | Admitting: Endocrinology

## 2021-10-04 MED ORDER — NOVOLIN 70/30 FLEXPEN (70-30) 100 UNIT/ML ~~LOC~~ SUPN: 200.0000 [IU] | PEN_INJECTOR | Freq: Every day | SUBCUTANEOUS | 3 refills | Status: DC

## 2021-10-04 NOTE — Telephone Encounter (Signed)
Message sent thru MyChart 

## 2021-10-05 ENCOUNTER — Telehealth: Payer: Self-pay | Admitting: Endocrinology

## 2021-10-05 NOTE — Telephone Encounter (Signed)
Patient has now been informed to buy Insulin NPH from [facility] taking 1 dose today and 1 tomorrow morning with the same number of units. Patient expressed understanding and will be going to [facility] to get the insulin. Patient informed to give [place] a call with any further questions or concerns.

## 2021-10-05 NOTE — Telephone Encounter (Signed)
Pharmacy is calling in to get clarification on the NOVOLIN that was sent to the pharmacy today they would like to have a call back.  Due to the pt is out and pts sugars are running high.

## 2021-10-06 ENCOUNTER — Other Ambulatory Visit: Payer: Self-pay

## 2021-10-06 ENCOUNTER — Ambulatory Visit (INDEPENDENT_AMBULATORY_CARE_PROVIDER_SITE_OTHER): Payer: Medicare Other | Admitting: Endocrinology

## 2021-10-06 VITALS — BP 128/70 | HR 83 | Ht 70.0 in | Wt 283.6 lb

## 2021-10-06 DIAGNOSIS — E1165 Type 2 diabetes mellitus with hyperglycemia: Secondary | ICD-10-CM

## 2021-10-06 DIAGNOSIS — E113592 Type 2 diabetes mellitus with proliferative diabetic retinopathy without macular edema, left eye: Secondary | ICD-10-CM | POA: Diagnosis not present

## 2021-10-06 LAB — POCT GLYCOSYLATED HEMOGLOBIN (HGB A1C): Hemoglobin A1C: 11.6 % — AB (ref 4.0–5.6)

## 2021-10-06 MED ORDER — INSULIN NPH (HUMAN) (ISOPHANE) 100 UNIT/ML ~~LOC~~ SUSP: 200.0000 [IU] | SUBCUTANEOUS | 3 refills | Status: DC

## 2021-10-06 NOTE — Progress Notes (Signed)
BP 113/73    Pulse 83    Temp 98.6 F (37 C) (Oral)    Ht 5\' 10"  (1.778 m)    Wt 284 lb 3.2 oz (128.9 kg)    SpO2 95%    BMI 40.78 kg/m    Subjective:    Patient ID: Andrew Pruitt, male    DOB: May 29, 1956, 66 y.o.   MRN: [id number]  HPI: Andrew Pruitt is a 66 y.o. male  Chief Complaint  Patient presents with   Depression   Anxiety    MOOD Patient states he feels like his mood has been better.  Patient states his daughter wants him and his wife move out.  He feels like he does feel like he is doing more things that he used to enjoy. Denies SI.   [place] Office Visit from 10/09/2021 in [place] [place]  PHQ-9 Total Score 9      GAD 7 : Generalized Anxiety Score 10/09/2021 09/07/2021 08/01/2021 05/31/2021  Nervous, Anxious, on Edge 0 0 1 1  Control/stop worrying 0 0 2 3  Worry too much - different things 1 0 2 2  Trouble relaxing 1 0 2 0  Restless 0 0 2 1  Easily annoyed or irritable 0 1 3 1   Afraid - awful might happen 0 0 2 3  Total GAD 7 Score 2 1 14 11   Anxiety Difficulty Somewhat difficult Not difficult at all Not difficult at all Very difficult      Relevant past medical, surgical, family and social history reviewed and updated as indicated. Interim medical history since our last visit reviewed. Allergies and medications reviewed and updated.  Review of Systems  Eyes:  Negative for visual disturbance.  Respiratory:  Negative for chest tightness and shortness of breath.   Cardiovascular:  Negative for chest pain, palpitations and leg swelling.  Endocrine: Negative for polydipsia and polyuria.  Neurological:  Negative for dizziness, light-headedness, numbness and headaches.  Psychiatric/Behavioral:  Positive for dysphoric mood and sleep disturbance. Negative for suicidal ideas. The patient is nervous/anxious.    Per HPI unless specifically indicated above     Objective:    BP 113/73    Pulse 83    Temp 98.6 F (37 C) (Oral)    Ht 5\' 10"  (1.778 m)     Wt 284 lb 3.2 oz (128.9 kg)    SpO2 95%    BMI 40.78 kg/m   Wt Readings from Last 3 Encounters:  10/09/21 284 lb 3.2 oz (128.9 kg)  10/06/21 283 lb 9.6 oz (128.6 kg)  09/07/21 289 lb 12.8 oz (131.5 kg)    Physical Exam Vitals and nursing note reviewed.  Constitutional:      General: He is not in acute distress.    Appearance: Normal appearance. He is obese. He is not ill-appearing, toxic-appearing or diaphoretic.  HENT:     Head: Normocephalic.     Right Ear: External ear normal.     Left Ear: External ear normal.     Nose: Nose normal. No congestion or rhinorrhea.     Mouth/Throat:     Mouth: Mucous membranes are moist.  Eyes:     General:        Right eye: No discharge.        Left eye: No discharge.     Extraocular Movements: Extraocular movements intact.     Conjunctiva/sclera: Conjunctivae normal.     Pupils: Pupils are equal, round, and reactive to light.  Cardiovascular:  Rate and Rhythm: Normal rate and regular rhythm.     Heart sounds: No murmur heard. Pulmonary:     Effort: Pulmonary effort is normal. No respiratory distress.     Breath sounds: Normal breath sounds. No wheezing, rhonchi or rales.  Abdominal:     General: Abdomen is flat. Bowel sounds are normal.  Musculoskeletal:     Cervical back: Normal range of motion and neck supple.  Skin:    General: Skin is warm and dry.     Capillary Refill: Capillary refill takes less than 2 seconds.  Neurological:     General: No focal deficit present.     Mental Status: He is alert and oriented to person, place, and time.  Psychiatric:        Mood and Affect: Mood normal.        Behavior: Behavior normal.        Thought Content: Thought content normal.        Judgment: Judgment normal.    Results for orders placed or performed in visit on 10/06/21  POCT glycosylated hemoglobin (Hb A1C)  Result Value Ref Range   Hemoglobin A1C 11.6 (A) 4.0 - 5.6 %   HbA1c POC (<> result, manual entry)     HbA1c, POC  (prediabetic range)     HbA1c, POC (controlled diabetic range)        Assessment & Plan:   Problem List Items Addressed This Visit       Other   Depression, recurrent ([place]) - Primary    Chronic.  Controlled.  Continue with current medication regimen of Effexor 75mg  daily.  Refill sent today.  Return to clinic in 3 months for reevaluation.  Call sooner if concerns arise.        Relevant Medications   venlafaxine XR (EFFEXOR XR) 75 MG 24 hr capsule     Follow up plan: Return in about 3 months (around 01/07/2022) for Depression/Anxiety FU, HTN, HLD, DM2 FU.

## 2021-10-06 NOTE — Patient Instructions (Signed)
check your blood sugar twice a day.  vary the time of day when you check, between before the 3 meals, and at bedtime.  also check if you have symptoms of your blood sugar being too high or too low.  please keep a record of the readings and bring it to your next appointment here (or you can bring the meter itself).  You can write it on any piece of paper.  please call us sooner if your blood sugar goes below 70, or if most of your readings are over 200.   Please continue the same insulin, Trulicity, and [place].   Please come back for a follow-up appointment in 2 months.

## 2021-10-06 NOTE — Progress Notes (Signed)
Subjective:    Patient ID: Andrew Pruitt, male    DOB: 1956/01/04, 66 y.o.   MRN: [id number]  HPI Pt returns for f/u of diabetes mellitus:  DM type: Insulin-requiring type 2 Dx'ed: 4098 Complications: PN, PDR, and CRI.   Therapy: insulin since 2005, and Farxiga.   DKA: never Severe hypoglycemia: never Pancreatitis: never Pancreatic imaging: never SDOH: he could not afford Ozempic or Rybelsus.   Other: he declines multiple daily injections: Lantus was changed to NPH, due to pattern of cbg's; he eats meals at 7AM, 12PM, and 8PM; he gets Trulicity and [place] from pt assist.   Interval history: Pt says yesterday, he got NPH from [place].  Today, glucose improved to 69 fasting (after he took a full dose yest evening, as I advised).  He will move back to [place] in 2 mos.    Past Medical History:  Diagnosis Date   Arthritis    Depression    Diabetes mellitus without complication ([place])    Hyperlipidemia    Hypertension     Past Surgical History:  Procedure Laterality Date   CATARACT EXTRACTION Right    CATARACT EXTRACTION Left    TONSILLECTOMY      Social History   Socioeconomic History   Marital status: Married    Spouse name: Not on file   Number of children: Not on file   Years of education: Not on file   Highest education level: Not on file  Occupational History   Not on file  Tobacco Use   Smoking status: Never   Smokeless tobacco: Never  Vaping Use   Vaping Use: Never used  Substance and Sexual Activity   Alcohol use: Not Currently   Drug use: Never   Sexual activity: Yes  Other Topics Concern   Not on file  Social History Narrative   Not on file   Social Determinants of Health   Financial Resource Strain: Low Risk    Difficulty of Paying Living Expenses: Not very hard  Food Insecurity: No Food Insecurity   Worried About Running Out of Food in the Last Year: Never true   [place] in the Last Year: Never true  Transportation Needs: No Transportation  Needs   Lack of Transportation (Medical): No   Lack of Transportation (Non-Medical): No  Physical Activity: Inactive   Days of Exercise per Week: 0 days   Minutes of Exercise per Session: 0 min  Stress: No Stress Concern Present   Feeling of Stress : Only a little  Social Connections: Moderately Isolated   Frequency of Communication with Friends and Family: More than three times a week   Frequency of Social Gatherings with Friends and Family: More than three times a week   Attends Religious Services: Never   Marine scientist or Organizations: No   Attends Music therapist: Never   Marital Status: Married  Human resources officer Violence: Not At Risk   Fear of Current or Ex-Partner: No   Emotionally Abused: No   Physically Abused: No   Sexually Abused: No    Current Outpatient Medications on File Prior to Visit  Medication Sig Dispense Refill   amLODipine (NORVASC) 5 MG tablet TAKE 1 TABLET BY MOUTH DAILY. FURTHER REFILLS WILL NEED TO COME FROM NEW PRIMARY CARE PROVIDER 90 tablet 0   atorvastatin (LIPITOR) 80 MG tablet Take 1 tablet (80 mg total) by mouth daily. 90 tablet 3   B-D UF III MINI PEN NEEDLES 31G X  5 MM MISC USE DAILY 100 each 0   Continuous Blood Gluc Receiver (FREESTYLE LIBRE 14 DAY READER) DEVI Use to check glucose levels TID; E11.9 1 each 0   Continuous Blood Gluc Sensor (FREESTYLE LIBRE 14 DAY SENSOR) MISC Use to check glucose TID. Change every 14 days. E 11.9 6 each 3   dapagliflozin propanediol (FARXIGA) 10 MG TABS tablet Take 1 tablet (10 mg total) by mouth daily before breakfast. 90 tablet 1   diclofenac Sodium (VOLTAREN) 1 % GEL Apply 2 g topically 4 (four) times daily. 100 g 1   Dulaglutide (TRULICITY) 0.07 HQ/1.9XJ SOPN Inject 0.75 mg into the skin once a week. 6 mL 3   fenofibrate (TRICOR) 145 MG tablet Take 1 tablet (145 mg total) by mouth daily. 90 tablet 1   gabapentin (NEURONTIN) 100 MG capsule Take 1 capsule (100 mg total) by mouth 3 (three)  times daily. 90 capsule 0   hydrochlorothiazide (HYDRODIURIL) 25 MG tablet TAKE 1 TABLET BY MOUTH EVERY DAY 90 tablet 0   venlafaxine XR (EFFEXOR XR) 75 MG 24 hr capsule Take 1 capsule (75 mg total) by mouth daily with breakfast. 30 capsule 0   No current facility-administered medications on file prior to visit.    Allergies  Allergen Reactions   Niaspan [Niacin] Other (See Comments)    Red skin, red eyes   Trazodone Other (See Comments)    nightmares    Family History  Problem Relation Age of Onset   Cancer Mother        Uterine    Early death Mother    Stroke Mother    Diabetes Father    Heart attack Father    Heart disease Father    Hyperlipidemia Father    Diabetes Brother    Alzheimer's disease Maternal Grandmother    Kidney failure Maternal Grandfather    Diabetes Paternal Grandfather    Diabetes Brother    Diabetes Brother    Colon cancer Neg Hx    Pancreatic cancer Neg Hx    Esophageal cancer Neg Hx     BP 128/70    Pulse 83    Ht 5\' 10"  (1.778 m)    Wt 283 lb 9.6 oz (128.6 kg)    SpO2 96%    BMI 40.69 kg/m   Review of Systems     Objective:   Physical Exam   Lab Results  Component Value Date   HGBA1C 11.6 (A) 10/06/2021      Assessment & Plan:  Insulin-requiring type 2 DM: uncontrolled. However, he is back on insulin now.  Patient Instructions  check your blood sugar twice a day.  vary the time of day when you check, between before the 3 meals, and at bedtime.  also check if you have symptoms of your blood sugar being too high or too low.  please keep a record of the readings and bring it to your next appointment here (or you can bring the meter itself).  You can write it on any piece of paper.  please call us sooner if your blood sugar goes below 70, or if most of your readings are over 200.   Please continue the same insulin, Trulicity, and [place].   Please come back for a follow-up appointment in 2 months.

## 2021-10-09 ENCOUNTER — Ambulatory Visit (INDEPENDENT_AMBULATORY_CARE_PROVIDER_SITE_OTHER): Payer: Medicare Other | Admitting: Nurse Practitioner

## 2021-10-09 ENCOUNTER — Encounter: Payer: Self-pay | Admitting: Nurse Practitioner

## 2021-10-09 ENCOUNTER — Telehealth: Payer: Self-pay

## 2021-10-09 ENCOUNTER — Other Ambulatory Visit: Payer: Self-pay

## 2021-10-09 VITALS — BP 113/73 | HR 83 | Temp 98.6°F | Ht 70.0 in | Wt 284.2 lb

## 2021-10-09 DIAGNOSIS — F32A Depression, unspecified: Secondary | ICD-10-CM | POA: Diagnosis not present

## 2021-10-09 DIAGNOSIS — F339 Major depressive disorder, recurrent, unspecified: Secondary | ICD-10-CM | POA: Diagnosis not present

## 2021-10-09 MED ORDER — VENLAFAXINE HCL ER 75 MG PO CP24: 75.0000 mg | ORAL_CAPSULE | Freq: Every day | ORAL | 1 refills | Status: AC

## 2021-10-09 NOTE — Assessment & Plan Note (Signed)
Chronic.  Controlled.  Continue with current medication regimen of Effexor 75mg  daily.  Refill sent today.  Return to clinic in 3 months for reevaluation.  Call sooner if concerns arise.

## 2021-10-09 NOTE — Progress Notes (Signed)
I have contacted [employer] about patient Trulicity Patient Assistance application status. The application was on hold due to verifying and confirming patient's insurance information. I have confirmed that the patient has medicare Part A and part B and has AARP. The representative stated the application will need to be reviewed and will send a letter to the patient once its been approved or denied and will also send a fax to [facility] on the status of the application as well.  Corrie Mckusick, [place]

## 2021-10-10 DIAGNOSIS — F32A Depression, unspecified: Secondary | ICD-10-CM

## 2021-10-10 DIAGNOSIS — I152 Hypertension secondary to endocrine disorders: Secondary | ICD-10-CM

## 2021-10-10 DIAGNOSIS — E785 Hyperlipidemia, unspecified: Secondary | ICD-10-CM

## 2021-10-10 DIAGNOSIS — Z794 Long term (current) use of insulin: Secondary | ICD-10-CM

## 2021-10-10 DIAGNOSIS — E1159 Type 2 diabetes mellitus with other circulatory complications: Secondary | ICD-10-CM

## 2021-10-10 DIAGNOSIS — E1169 Type 2 diabetes mellitus with other specified complication: Secondary | ICD-10-CM

## 2021-10-10 DIAGNOSIS — E1122 Type 2 diabetes mellitus with diabetic chronic kidney disease: Secondary | ICD-10-CM | POA: Diagnosis not present

## 2021-10-10 DIAGNOSIS — N183 Chronic kidney disease, stage 3 unspecified: Secondary | ICD-10-CM

## 2021-10-20 ENCOUNTER — Telehealth: Payer: Medicare Other

## 2021-10-20 ENCOUNTER — Ambulatory Visit (INDEPENDENT_AMBULATORY_CARE_PROVIDER_SITE_OTHER): Payer: Medicare Other

## 2021-10-20 DIAGNOSIS — F32A Depression, unspecified: Secondary | ICD-10-CM

## 2021-10-20 DIAGNOSIS — N183 Chronic kidney disease, stage 3 unspecified: Secondary | ICD-10-CM

## 2021-10-20 DIAGNOSIS — E1159 Type 2 diabetes mellitus with other circulatory complications: Secondary | ICD-10-CM

## 2021-10-20 DIAGNOSIS — E1169 Type 2 diabetes mellitus with other specified complication: Secondary | ICD-10-CM

## 2021-10-20 DIAGNOSIS — Z794 Long term (current) use of insulin: Secondary | ICD-10-CM

## 2021-10-20 DIAGNOSIS — G479 Sleep disorder, unspecified: Secondary | ICD-10-CM

## 2021-10-20 NOTE — Chronic Care Management (AMB) (Signed)
Chronic Care Management   CCM RN Visit Note  10/20/2021 Name: Andrew Pruitt MRN: [id number] DOB: [birth year]  Subjective: Andrew Pruitt is a 66 y.o. year old male who is a primary care patient of Jon Billings, NP. The care management team was consulted for assistance with disease management and care coordination needs.    Engaged with patient by telephone for follow up visit in response to provider referral for case management and/or care coordination services.   Consent to Services:  The patient was given information about Chronic Care Management services, agreed to services, and gave verbal consent prior to initiation of services.  Please see initial visit note for detailed documentation.   Patient agreed to services and verbal consent obtained.   Assessment: Review of patient past medical history, allergies, medications, health status, including review of consultants reports, laboratory and other test data, was performed as part of comprehensive evaluation and provision of chronic care management services.   SDOH (Social Determinants of Health) assessments and interventions performed:    CCM Care Plan  Allergies  Allergen Reactions   Niaspan [Niacin] Other (See Comments)    Red skin, red eyes   Trazodone Other (See Comments)    nightmares    Outpatient Encounter Medications as of 10/20/2021  Medication Sig   amLODipine (NORVASC) 5 MG tablet TAKE 1 TABLET BY MOUTH DAILY. FURTHER REFILLS WILL NEED TO COME FROM NEW PRIMARY CARE PROVIDER   atorvastatin (LIPITOR) 80 MG tablet Take 1 tablet (80 mg total) by mouth daily.   B-D UF III MINI PEN NEEDLES 31G X 5 MM MISC USE DAILY   Continuous Blood Gluc Receiver (FREESTYLE LIBRE 14 DAY READER) DEVI Use to check glucose levels TID; E11.9   Continuous Blood Gluc Sensor (FREESTYLE LIBRE 14 DAY SENSOR) MISC Use to check glucose TID. Change every 14 days. E 11.9   dapagliflozin propanediol (FARXIGA) 10 MG TABS tablet Take 1 tablet (10 mg  total) by mouth daily before breakfast.   diclofenac Sodium (VOLTAREN) 1 % GEL Apply 2 g topically 4 (four) times daily.   Dulaglutide (TRULICITY) 5.05 WP/7.9YI SOPN Inject 0.75 mg into the skin once a week.   fenofibrate (TRICOR) 145 MG tablet Take 1 tablet (145 mg total) by mouth daily.   gabapentin (NEURONTIN) 100 MG capsule Take 1 capsule (100 mg total) by mouth 3 (three) times daily.   hydrochlorothiazide (HYDRODIURIL) 25 MG tablet TAKE 1 TABLET BY MOUTH EVERY DAY   insulin NPH Human (NOVOLIN N) 100 UNIT/ML injection Inject 2 mLs (200 Units total) into the skin every morning.   venlafaxine XR (EFFEXOR XR) 75 MG 24 hr capsule Take 1 capsule (75 mg total) by mouth daily with breakfast.   No facility-administered encounter medications on file as of 10/20/2021.    Patient Active Problem List   Diagnosis Date Noted   Depression, recurrent ([place])    Nuclear sclerotic cataract of both eyes 09/07/2020   [place] retinal tear, right eye 08/11/2020   Viral warts 06/07/2020   Diabetic macular edema of right eye with proliferative retinopathy associated with type 2 diabetes mellitus ([place]) 04/20/2020   Proliferative diabetic retinopathy of right eye ([place]) 03/02/2020   Type 2 diabetes mellitus with proliferative diabetic retinopathy of left eye without macular edema ([place]) 03/02/2020   Diabetic neuropathy associated with type 2 diabetes mellitus ([place]) 02/22/2020   Type 2 diabetes mellitus ([place]) 02/22/2020   Hypertension associated with diabetes ([place]) 02/22/2020   Hyperlipidemia associated with type 2 diabetes mellitus ([place]) 02/22/2020  Prostate cancer screening 02/22/2020   Plantar warts 02/22/2020    Conditions to be addressed/monitored:HTN, HLD, DMII, Depression, and Insomnia   Care Plan : RNCM: General Plan of Care (Adult) for Chronic Disease Management and Care Coordination Needs  Updates made by Vanita Ingles, RN since 10/20/2021 12:00 AM     Problem: RNCM: Development of Plan of Care  for Chronic Disease Management(DM, HTN, HLD, Depression, Insomnia)   Priority: High     Long-Range Goal: RNCM: Effective Management  of Plan of Care for Chronic Disease Management(DM, HTN, HLD, Depression, Insomnia)   Start Date: 07/31/2021  Expected End Date: 07/31/2022  Priority: High  Note:   Current Barriers:  Knowledge Deficits related to plan of care for management of HTN, HLD, DMII, and Depression: depressed mood anxiety insomnia  Chronic Disease Management support and education needs related to HTN, HLD, DMII, and Depression: depressed mood anxiety insomnia Financial Constraints.   RNCM Clinical Goal(s):  Patient will verbalize basic understanding of HTN, HLD, DMII, Depression, and insomnia  disease process and self health management plan as evidenced by compliance with the plan of care, following dietary restrictions, and working with CCM team to effectively manage health and well being  take all medications exactly as prescribed and will call provider for medication related questions as evidenced by taking medications as prescribed and calling for refills before running out of medications     attend all scheduled medical appointments: 01-08-2022 at 920 am as evidenced by keeping appointments and calling the office for needed appointment changes         demonstrate improved and ongoing health management independence as evidenced by compliance with plan of care, calling the office for changes, and working with the CCM team to optimize health and well being.        demonstrate a decrease in HTN, HLD, DMII, Depression, and insomnia  exacerbations  as evidenced by discussing concerns with the provider and working with the CCM team for ongoing support and education demonstrate ongoing self health care management ability for effective management of chronic conditions  as evidenced by  working with the CCM team through collaboration with Consulting civil engineer, provider, and care team.    Interventions: 1:1 collaboration with primary care provider regarding development and update of comprehensive plan of care as evidenced by provider attestation and co-signature Inter-disciplinary care team collaboration (see longitudinal plan of care) Evaluation of current treatment plan related to  self management and patient's adherence to plan as established by provider   Diabetes:  (Status: Goal on Track (progressing): YES.) Long Term Goal   Lab Results  Component Value Date   HGBA1C 11.6 (A) 10/06/2021  Assessed patient's understanding of A1c goal: <7% Provided education to patient about basic DM disease process; Reviewed medications with patient and discussed importance of medication adherence. 07-31-2021: Is working with the endocrinologist office to see about getting assistance with medications to help control his DM. 09-26-2021: Is working with endocrinologist and pharm D on getting assistance for help with medications- has Fargixa and waiting to hear back on Trulicity. 10-20-2021: Still working with the pharm D on help with insulin cost. Did go to [facility] and got Novilin N for 25.00 and is taking this medication. States blood sugars are much better;        Reviewed prescribed diet with patient heart healthy/ADA diet. 10-20-2021: Review with the patient. The patient is monitoring his dietary habits and what he is eating; Counseled on importance of regular laboratory monitoring  as prescribed;        Discussed plans with patient for ongoing care management follow up and provided patient with direct contact information for care management team;      Provided patient with written educational materials related to hypo and hyperglycemia and importance of correct treatment. 09-26-2021: Denies any lows or highs greater than 200 now. States the highest he has seen is 190's. 10-20-2021: States he has had blood sugar levels at 150's. The patient states that he has even had a couple at 38. Education and  support given. The patient states it took him 2 days to get it regulated back but he is doing well now ;       Reviewed scheduled/upcoming provider appointments including: 10-09-2021 at 1020 am with the pcp, saw endocrinologist recently, saw kidney specialist recently and got a good report.  ;         Advised patient, providing education and rationale, to check cbg before meals and at bedtime, when you have symptoms of low or high blood sugar, and before and after exercise and record. 09-26-2021: The patient states he is seeing numbers in the 120's now and his blood sugars are coming down. Sometimes it may go up to 190's but it is a lot better than what it was. A1C is trending down. Education and support given.   10-20-2021: States that his blood sugars are better now and around 150.  call provider for findings outside established parameters;       Referral made to pharmacy team for assistance with medications cost constraints and management ;       Review of patient status, including review of consultants reports, relevant laboratory and other test results, and medications completed;        Depression and Insomnia  (Status: Goal on Track (progressing): YES.) Long Term Goal  Evaluation of current treatment plan related to Depression and Insomnia  , Mental Health Concerns  self-management and patient's adherence to plan as established by provider. 09-26-2021: The patient states he had to stop taking the trazodone as he was having bad nightmares so he stopped it altogether. Will discuss other options with the pcp at appointment on 10-09-2021.  The patient and his wife were in [place] looking at houses. The plan is to purchase a house in [place] and move back to [place]. The patient states originally he and his wife moved to [place] because his daughter was going to help him and his wife get a house. That is not how it worked out so they are moving back to [place] where they have other children and family.  The  patient is happy with the decision. He states he will miss the people in [place] and the healthcare but he will be happy to be back home. Plans on keeping follow up appointments and will let the staff know when he is moving for good. 10-20-2021: The patient and his wife are still looking for a house in [place]. They do not know for sure when they will be moving. Will continue to monitor.  Discussed plans with patient for ongoing care management follow up and provided patient with direct contact information for care management team Advised patient to discuss his depression and insomnia with the pcp tomorrow. The patient states that his wife says he is depressed. He says he is only sleeping about 3 hours a night. 09-26-2021: The patient states he will discuss with the pcp other options. The patient states he is doing  well with the Effexor, denies any new concerns; Provided education to patient re: sleep hygiene, healthy sleep habits, pacing activity; Reviewed scheduled/upcoming provider appointments including 01-08-2022 with 920 am with the pcp ; Discussed plans with patient for ongoing care management follow up and provided patient with direct contact information for care management team; Advised patient to discuss medication options that would help with sleep with provider; Screening for signs and symptoms of depression related to chronic disease state;  Assessed social determinant of health barriers;   Hyperlipidemia:  (Status: Goal on Track (progressing): YES.) Long Term Goal  Lab Results  Component Value Date   CHOL 229 (H) 08/01/2021   HDL 24 (L) 08/01/2021   LDLCALC 106 (H) 08/01/2021   LDLDIRECT 81.0 02/11/2020   TRIG 574 (HH) 08/01/2021   CHOLHDL 9.5 (H) 08/01/2021     Medication review performed; medication list updated in electronic medical record. 10-20-2021: Takes Lipitor 80 mg QD Provider established cholesterol goals reviewed; Counseled on importance of regular laboratory monitoring as  prescribed; Provided HLD educational materials; Reviewed role and benefits of statin for ASCVD risk reduction; Discussed strategies to manage statin-induced myalgias; Reviewed importance of limiting foods high in cholesterol. 10-20-2021: The patient is monitoring dietary intake. Reviewed with the patient heart healthy/ADA diet  Reviewed exercise goals and target of 150 minutes per week;  Hypertension: (Status: Goal on Track (progressing): YES. Goal Not Met.) Last practice recorded BP readings:  BP Readings from Last 3 Encounters:  10/09/21 113/73  10/06/21 128/70  09/07/21 123/73  Most recent eGFR/CrCl:  Lab Results  Component Value Date   EGFR 36 (L) 08/01/2021    No components found for: CRCL  Evaluation of current treatment plan related to hypertension self management and patient's adherence to plan as established by provider. 10-20-2021: The patient denies any issues with HTN or heart health. Will continue to monitor.    Provided education to patient re: stroke prevention, s/s of heart attack and stroke; Reviewed prescribed diet heart healthy/ADA diet. 10-20-2021: Review of heart healthy/ADA diet  Reviewed medications with patient and discussed importance of compliance;  Discussed plans with patient for ongoing care management follow up and provided patient with direct contact information for care management team; Advised patient, providing education and rationale, to monitor blood pressure daily and record, calling PCP for findings outside established parameters;  Advised patient to discuss blood pressure trends  with provider; Provided education on prescribed diet heart healthy/ADA diet ;  Discussed complications of poorly controlled blood pressure such as heart disease, stroke, circulatory complications, vision complications, kidney impairment, sexual dysfunction;   Patient Goals/Self-Care Activities: Take medications as prescribed   Attend all scheduled provider appointments Call  pharmacy for medication refills 3-7 days in advance of running out of medications Attend church or other social activities Perform all self care activities independently  Perform IADL's (shopping, preparing meals, housekeeping, managing finances) independently Call provider office for new concerns or questions  Work with the social worker to address care coordination needs and will continue to work with the clinical team to address health care and disease management related needs call 911 call the Suicide and Crisis Lifeline: 988 call the [place] National Suicide Prevention Lifeline: (712) 838-6707 call 1-800-273-TALK (toll free, 24 hour hotline) if experiencing a Mental Health or [place]  keep appointment with eye doctor check blood sugar at prescribed times: before meals and at bedtime, when you have symptoms of low or high blood sugar, and before and after exercise check feet daily  for cuts, sores or redness enter blood sugar readings and medication or insulin into daily log take the blood sugar log to all doctor visits trim toenails straight across drink 6 to 8 glasses of water each day eat fish at least once per week fill half of plate with vegetables limit fast food meals to no more than 1 per week manage portion size prepare main meal at home 3 to 5 days each week read food labels for fat, fiber, carbohydrates and portion size reduce red meat to 2 to 3 times a week set a realistic goal do heel pump exercise 2 to 3 times each day keep feet up while sitting wash and dry feet carefully every day wear comfortable, cotton socks wear comfortable, well-fitting shoes check blood pressure weekly choose a place to take my blood pressure (home, clinic or office, retail store) learn about high blood pressure take blood pressure log to all doctor appointments call doctor for signs and symptoms of high blood pressure develop an action plan for high blood pressure keep all  doctor appointments take medications for blood pressure exactly as prescribed report new symptoms to your doctor eat more whole grains, fruits and vegetables, lean meats and healthy fats - call for medicine refill 2 or 3 days before it runs out - take all medications exactly as prescribed - call doctor with any symptoms you believe are related to your medicine - call doctor when you experience any new symptoms - go to all doctor appointments as scheduled - adhere to prescribed diet: heart healthy/ADA diet        Plan:Telephone follow up appointment with care management team member scheduled for:  11-17-2021 at 1 pm  Noreene Larsson RN, MSN, [facility] Mobile: [phone]

## 2021-10-20 NOTE — Patient Instructions (Signed)
Visit Information  Thank you for taking time to visit with me today. Please don't hesitate to contact me if I can be of assistance to you before our next scheduled telephone appointment.  Following are the goals we discussed today:  RNCM Clinical Goal(s):  Patient will verbalize basic understanding of HTN, HLD, DMII, Depression, and insomnia  disease process and self health management plan as evidenced by compliance with the plan of care, following dietary restrictions, and working with CCM team to effectively manage health and well being  take all medications exactly as prescribed and will call provider for medication related questions as evidenced by taking medications as prescribed and calling for refills before running out of medications     attend all scheduled medical appointments: 01-08-2022 at 920 am as evidenced by keeping appointments and calling the office for needed appointment changes         demonstrate improved and ongoing health management independence as evidenced by compliance with plan of care, calling the office for changes, and working with the CCM team to optimize health and well being.        demonstrate a decrease in HTN, HLD, DMII, Depression, and insomnia  exacerbations  as evidenced by discussing concerns with the provider and working with the CCM team for ongoing support and education demonstrate ongoing self health care management ability for effective management of chronic conditions  as evidenced by  working with the CCM team through collaboration with Consulting civil engineer, provider, and care team.    Interventions: 1:1 collaboration with primary care provider regarding development and update of comprehensive plan of care as evidenced by provider attestation and co-signature Inter-disciplinary care team collaboration (see longitudinal plan of care) Evaluation of current treatment plan related to  self management and patient's adherence to plan as established by provider      Diabetes:  (Status: Goal on Track (progressing): YES.) Long Term Goal         Lab Results  Component Value Date    HGBA1C 11.6 (A) 10/06/2021  Assessed patient's understanding of A1c goal: <7% Provided education to patient about basic DM disease process; Reviewed medications with patient and discussed importance of medication adherence. 07-31-2021: Is working with the endocrinologist office to see about getting assistance with medications to help control his DM. 09-26-2021: Is working with endocrinologist and pharm D on getting assistance for help with medications- has Fargixa and waiting to hear back on Trulicity. 10-20-2021: Still working with the pharm D on help with insulin cost. Did go to [facility] and got Novilin N for 25.00 and is taking this medication. States blood sugars are much better;        Reviewed prescribed diet with patient heart healthy/ADA diet. 10-20-2021: Review with the patient. The patient is monitoring his dietary habits and what he is eating; Counseled on importance of regular laboratory monitoring as prescribed;        Discussed plans with patient for ongoing care management follow up and provided patient with direct contact information for care management team;      Provided patient with written educational materials related to hypo and hyperglycemia and importance of correct treatment. 09-26-2021: Denies any lows or highs greater than 200 now. States the highest he has seen is 190's. 10-20-2021: States he has had blood sugar levels at 150's. The patient states that he has even had a couple at 37. Education and support given. The patient states it took him 2 days to get it regulated back  but he is doing well now ;       Reviewed scheduled/upcoming provider appointments including: 10-09-2021 at 1020 am with the pcp, saw endocrinologist recently, saw kidney specialist recently and got a good report.  ;         Advised patient, providing education and rationale, to check cbg before  meals and at bedtime, when you have symptoms of low or high blood sugar, and before and after exercise and record. 09-26-2021: The patient states he is seeing numbers in the 120's now and his blood sugars are coming down. Sometimes it may go up to 190's but it is a lot better than what it was. A1C is trending down. Education and support given.   10-20-2021: States that his blood sugars are better now and around 150.  call provider for findings outside established parameters;       Referral made to pharmacy team for assistance with medications cost constraints and management ;       Review of patient status, including review of consultants reports, relevant laboratory and other test results, and medications completed;         Depression and Insomnia  (Status: Goal on Track (progressing): YES.) Long Term Goal  Evaluation of current treatment plan related to Depression and Insomnia  , Mental Health Concerns  self-management and patient's adherence to plan as established by provider. 09-26-2021: The patient states he had to stop taking the trazodone as he was having bad nightmares so he stopped it altogether. Will discuss other options with the pcp at appointment on 10-09-2021.  The patient and his wife were in [place] looking at houses. The plan is to purchase a house in [place] and move back to [place]. The patient states originally he and his wife moved to [place] because his daughter was going to help him and his wife get a house. That is not how it worked out so they are moving back to [place] where they have other children and family.  The patient is happy with the decision. He states he will miss the people in [place] and the healthcare but he will be happy to be back home. Plans on keeping follow up appointments and will let the staff know when he is moving for good. 10-20-2021: The patient and his wife are still looking for a house in [place]. They do not know for sure when they will be moving. Will continue to  monitor.  Discussed plans with patient for ongoing care management follow up and provided patient with direct contact information for care management team Advised patient to discuss his depression and insomnia with the pcp tomorrow. The patient states that his wife says he is depressed. He says he is only sleeping about 3 hours a night. 09-26-2021: The patient states he will discuss with the pcp other options. The patient states he is doing well with the Effexor, denies any new concerns; Provided education to patient re: sleep hygiene, healthy sleep habits, pacing activity; Reviewed scheduled/upcoming provider appointments including 01-08-2022 with 920 am with the pcp ; Discussed plans with patient for ongoing care management follow up and provided patient with direct contact information for care management team; Advised patient to discuss medication options that would help with sleep with provider; Screening for signs and symptoms of depression related to chronic disease state;  Assessed social determinant of health barriers;    Hyperlipidemia:  (Status: Goal on Track (progressing): YES.) Long Term Goal       Lab  Results  Component Value Date    CHOL 229 (H) 08/01/2021    HDL 24 (L) 08/01/2021    LDLCALC 106 (H) 08/01/2021    LDLDIRECT 81.0 02/11/2020    TRIG 574 (HH) 08/01/2021    CHOLHDL 9.5 (H) 08/01/2021      Medication review performed; medication list updated in electronic medical record. 10-20-2021: Takes Lipitor 80 mg QD Provider established cholesterol goals reviewed; Counseled on importance of regular laboratory monitoring as prescribed; Provided HLD educational materials; Reviewed role and benefits of statin for ASCVD risk reduction; Discussed strategies to manage statin-induced myalgias; Reviewed importance of limiting foods high in cholesterol. 10-20-2021: The patient is monitoring dietary intake. Reviewed with the patient heart healthy/ADA diet  Reviewed exercise goals and  target of 150 minutes per week;   Hypertension: (Status: Goal on Track (progressing): YES. Goal Not Met.) Last practice recorded BP readings:     BP Readings from Last 3 Encounters:  10/09/21 113/73  10/06/21 128/70  09/07/21 123/73  Most recent eGFR/CrCl:       Lab Results  Component Value Date    EGFR 36 (L) 08/01/2021    No components found for: CRCL   Evaluation of current treatment plan related to hypertension self management and patient's adherence to plan as established by provider. 10-20-2021: The patient denies any issues with HTN or heart health. Will continue to monitor.    Provided education to patient re: stroke prevention, s/s of heart attack and stroke; Reviewed prescribed diet heart healthy/ADA diet. 10-20-2021: Review of heart healthy/ADA diet  Reviewed medications with patient and discussed importance of compliance;  Discussed plans with patient for ongoing care management follow up and provided patient with direct contact information for care management team; Advised patient, providing education and rationale, to monitor blood pressure daily and record, calling PCP for findings outside established parameters;  Advised patient to discuss blood pressure trends  with provider; Provided education on prescribed diet heart healthy/ADA diet ;  Discussed complications of poorly controlled blood pressure such as heart disease, stroke, circulatory complications, vision complications, kidney impairment, sexual dysfunction;    Patient Goals/Self-Care Activities: Take medications as prescribed   Attend all scheduled provider appointments Call pharmacy for medication refills 3-7 days in advance of running out of medications Attend church or other social activities Perform all self care activities independently  Perform IADL's (shopping, preparing meals, housekeeping, managing finances) independently Call provider office for new concerns or questions  Work with the social worker  to address care coordination needs and will continue to work with the clinical team to address health care and disease management related needs call 911 call the Suicide and Crisis Lifeline: 988 call the [place] National Suicide Prevention Lifeline: 6393987144 call 1-800-273-TALK (toll free, 24 hour hotline) if experiencing a Mental Health or [place]  keep appointment with eye doctor check blood sugar at prescribed times: before meals and at bedtime, when you have symptoms of low or high blood sugar, and before and after exercise check feet daily for cuts, sores or redness enter blood sugar readings and medication or insulin into daily log take the blood sugar log to all doctor visits trim toenails straight across drink 6 to 8 glasses of water each day eat fish at least once per week fill half of plate with vegetables limit fast food meals to no more than 1 per week manage portion size prepare main meal at home 3 to 5 days each week read food labels for fat, fiber,  carbohydrates and portion size reduce red meat to 2 to 3 times a week set a realistic goal do heel pump exercise 2 to 3 times each day keep feet up while sitting wash and dry feet carefully every day wear comfortable, cotton socks wear comfortable, well-fitting shoes check blood pressure weekly choose a place to take my blood pressure (home, clinic or office, retail store) learn about high blood pressure take blood pressure log to all doctor appointments call doctor for signs and symptoms of high blood pressure develop an action plan for high blood pressure keep all doctor appointments take medications for blood pressure exactly as prescribed report new symptoms to your doctor eat more whole grains, fruits and vegetables, lean meats and healthy fats - call for medicine refill 2 or 3 days before it runs out - take all medications exactly as prescribed - call doctor with any symptoms you believe are  related to your medicine - call doctor when you experience any new symptoms - go to all doctor appointments as scheduled - adhere to prescribed diet: heart healthy/ADA diet           Our next appointment is by telephone on 11-17-2021 at 1 pm  Please call the care guide team at [phone] if you need to cancel or reschedule your appointment.   If you are experiencing a Mental Health or [place] or need someone to talk to, please call the Suicide and Crisis Lifeline: 988 call the [place] National Suicide Prevention Lifeline: 706-761-3232 or TTY: (548)863-2910 TTY 830-294-5618) to talk to a trained counselor call 1-800-273-TALK (toll free, 24 hour hotline)   Patient verbalizes understanding of instructions and care plan provided today and agrees to view in [place]. Active MyChart status confirmed with patient.    Noreene Larsson RN, MSN, [facility] Mobile: [phone]

## 2021-10-23 ENCOUNTER — Ambulatory Visit: Payer: Medicare Other | Admitting: Dermatology

## 2021-10-25 ENCOUNTER — Telehealth: Payer: Self-pay

## 2021-10-25 NOTE — Chronic Care Management (AMB) (Signed)
° ° °  Chronic Care Management Pharmacy Assistant   Name: Andrew Pruitt  MRN: [id number] DOB: [birth year]  Reason for Encounter: Disease State General   Recent office visits:  10/09/21-Karen Mathis Dad, NP (PCP) Seen for depression and anxiety. Follow up in 3 months.   Recent consult visits:  10/06/21-Sean Loanne Drilling, MD (Endocrinology) Seen for a diabetic follow up. Follow up in 2 months.  10/02/21-Gary A. Rankin, MD (Ophthalmology) Seen for Diabetic Retinopathy with Macular Edema. Follow up in 2 months.   Hospital visits:  None in previous 6 months  Medications: Outpatient Encounter Medications as of 10/25/2021  Medication Sig   amLODipine (NORVASC) 5 MG tablet TAKE 1 TABLET BY MOUTH DAILY. FURTHER REFILLS WILL NEED TO COME FROM NEW PRIMARY CARE PROVIDER   atorvastatin (LIPITOR) 80 MG tablet Take 1 tablet (80 mg total) by mouth daily.   B-D UF III MINI PEN NEEDLES 31G X 5 MM MISC USE DAILY   Continuous Blood Gluc Receiver (FREESTYLE LIBRE 14 DAY READER) DEVI Use to check glucose levels TID; E11.9   Continuous Blood Gluc Sensor (FREESTYLE LIBRE 14 DAY SENSOR) MISC Use to check glucose TID. Change every 14 days. E 11.9   dapagliflozin propanediol (FARXIGA) 10 MG TABS tablet Take 1 tablet (10 mg total) by mouth daily before breakfast.   diclofenac Sodium (VOLTAREN) 1 % GEL Apply 2 g topically 4 (four) times daily.   Dulaglutide (TRULICITY) 7.35 HG/9.9ME SOPN Inject 0.75 mg into the skin once a week.   fenofibrate (TRICOR) 145 MG tablet Take 1 tablet (145 mg total) by mouth daily.   gabapentin (NEURONTIN) 100 MG capsule Take 1 capsule (100 mg total) by mouth 3 (three) times daily.   hydrochlorothiazide (HYDRODIURIL) 25 MG tablet TAKE 1 TABLET BY MOUTH EVERY DAY   insulin NPH Human (NOVOLIN N) 100 UNIT/ML injection Inject 2 mLs (200 Units total) into the skin every morning.   venlafaxine XR (EFFEXOR XR) 75 MG 24 hr capsule Take 1 capsule (75 mg total) by mouth daily with breakfast.   No  facility-administered encounter medications on file as of 10/25/2021.   Have you had any problems recently with your health? Patient states his insulin has been changed in vial formula and cut back from 200 units to 130 units.   Have you had any problems with your pharmacy? Patient states his insulin injector was 4,000 but was told by his provider to get the Vial formula and had drop down to $4.   What issues or side effects are you having with your medications? Patient states he has no issues or side effects to any of his medications.   What would you like me to pass along to Madelin Rear CPP for them to help you with?  Patient states he is starting to go to [employer] so he can loose the 30 lbs that he gained from the 200 units on his insuline and patient will be moving to [place] soon.  What can we do to take care of you better? Patient states there is nothing at this time.   Care Gaps: URINE MICROALBUMIN:Overdue since 02/10/2021  Star Rating Drugs: Trulciity 0.75mg  Last filled:07/28/21 84 DS Farxiga 10 mg Last filled:07/06/21 1 DS Atorvastatin 80 mg Last filled:07/06/21 1 DS  Andrew Pruitt, [place]

## 2021-11-07 DIAGNOSIS — N183 Chronic kidney disease, stage 3 unspecified: Secondary | ICD-10-CM

## 2021-11-07 DIAGNOSIS — E1122 Type 2 diabetes mellitus with diabetic chronic kidney disease: Secondary | ICD-10-CM

## 2021-11-07 DIAGNOSIS — E785 Hyperlipidemia, unspecified: Secondary | ICD-10-CM

## 2021-11-07 DIAGNOSIS — F32A Depression, unspecified: Secondary | ICD-10-CM

## 2021-11-07 DIAGNOSIS — E1169 Type 2 diabetes mellitus with other specified complication: Secondary | ICD-10-CM

## 2021-11-07 DIAGNOSIS — I152 Hypertension secondary to endocrine disorders: Secondary | ICD-10-CM

## 2021-11-07 DIAGNOSIS — Z794 Long term (current) use of insulin: Secondary | ICD-10-CM

## 2021-11-07 DIAGNOSIS — E1159 Type 2 diabetes mellitus with other circulatory complications: Secondary | ICD-10-CM

## 2021-11-17 ENCOUNTER — Telehealth: Payer: Medicare Other

## 2021-11-30 ENCOUNTER — Encounter (INDEPENDENT_AMBULATORY_CARE_PROVIDER_SITE_OTHER): Payer: Medicare Other | Admitting: Ophthalmology

## 2021-11-30 ENCOUNTER — Encounter (INDEPENDENT_AMBULATORY_CARE_PROVIDER_SITE_OTHER): Payer: Self-pay

## 2021-12-05 ENCOUNTER — Other Ambulatory Visit: Payer: Self-pay | Admitting: Nurse Practitioner

## 2021-12-06 NOTE — Telephone Encounter (Signed)
Requested Prescriptions  ?Pending Prescriptions Disp Refills  ?? fenofibrate (TRICOR) 145 MG tablet [Pharmacy Med Name: FENOFIBRATE 145 MG TABLET] 90 tablet 1  ?  Sig: TAKE 1 TABLET BY MOUTH EVERY DAY  ?  ? Cardiovascular:  Antilipid - Fibric Acid Derivatives Failed - 12/05/2021  9:50 AM  ?  ?  Failed - ALT in normal range and within 360 days  ?  ALT  ?Date Value Ref Range Status  ?08/01/2021 104 (H) 0 - 44 IU/L Final  ?   ?  ?  Failed - Cr in normal range and within 360 days  ?  Creatinine, Ser  ?Date Value Ref Range Status  ?08/01/2021 2.01 (H) 0.76 - 1.27 mg/dL Final  ? ?Creatinine,U  ?Date Value Ref Range Status  ?02/11/2020 51.7 mg/dL Final  ?   ?  ?  Failed - Lipid Panel in normal range within the last 12 months  ?  Cholesterol, Total  ?Date Value Ref Range Status  ?08/01/2021 229 (H) 100 - 199 mg/dL Final  ? ?LDL Chol Calc (NIH)  ?Date Value Ref Range Status  ?08/01/2021 106 (H) 0 - 99 mg/dL Final  ? ?Direct LDL  ?Date Value Ref Range Status  ?02/11/2020 81.0 mg/dL Final  ?  Comment:  ?  Optimal:  <100 mg/dLNear or Above Optimal:  100-129 mg/dLBorderline High:  130-159 mg/dLHigh:  160-189 mg/dLVery High:  >190 mg/dL  ? ?HDL  ?Date Value Ref Range Status  ?08/01/2021 24 (L) >39 mg/dL Final  ? ?Triglycerides  ?Date Value Ref Range Status  ?08/01/2021 574 (HH) 0 - 149 mg/dL Final  ? ?  ?  ?  Passed - AST in normal range and within 360 days  ?  AST  ?Date Value Ref Range Status  ?08/01/2021 31 0 - 40 IU/L Final  ?   ?  ?  Passed - HGB in normal range and within 360 days  ?  Hemoglobin  ?Date Value Ref Range Status  ?04/05/2021 16.1 13.0 - 17.0 g/dL Final  ?   ?  ?  Passed - HCT in normal range and within 360 days  ?  HCT  ?Date Value Ref Range Status  ?04/05/2021 44.9 39.0 - 52.0 % Final  ?   ?  ?  Passed - PLT in normal range and within 360 days  ?  Platelets  ?Date Value Ref Range Status  ?04/05/2021 305 150 - 400 K/uL Final  ?   ?  ?  Passed - WBC in normal range and within 360 days  ?  WBC  ?Date Value Ref  Range Status  ?04/05/2021 5.6 4.0 - 10.5 K/uL Final  ?   ?  ?  Passed - eGFR is 30 or above and within 360 days  ?  GFR, Estimated  ?Date Value Ref Range Status  ?04/05/2021 45 (L) >60 mL/min Final  ?  Comment:  ?  (NOTE) ?Calculated using the CKD-EPI Creatinine Equation (2021) ?  ? ?GFR  ?Date Value Ref Range Status  ?02/11/2020 57.44 (L) >60.00 mL/min Final  ? ?eGFR  ?Date Value Ref Range Status  ?08/01/2021 36 (L) >59 mL/min/1.73 Final  ?   ?  ?  Passed - Valid encounter within last 12 months  ?  Recent Outpatient Visits   ?      ? 1 month ago Depression, unspecified depression type  ? [place], NP  ? 3 months ago Depression, unspecified depression type  ? [facility] Jon Billings, NP  ? 4 months ago Hypertension associated with diabetes [facility])  ? [place], NP  ? 7 months ago Uncontrolled type II diabetes mellitus with chronic kidney disease ([place])  ? [place], NP  ? 10 months ago Hypertension associated with diabetes [facility])  ? [facility] Jon Billings, NP  ?  ?  ?Future Appointments   ?        ? In 1 month Jon Billings, NP [facility]  ?  ? ?  ?  ?  ? ?

## 2021-12-08 ENCOUNTER — Telehealth: Payer: Self-pay

## 2021-12-08 ENCOUNTER — Ambulatory Visit: Payer: Medicare Other | Admitting: Endocrinology

## 2021-12-08 NOTE — Telephone Encounter (Signed)
Patient LVM on 12/07/21 stating that he will not be able to make todays appt (12/08/21). Will cancel patients appt for today. ?

## 2021-12-18 ENCOUNTER — Encounter: Payer: Self-pay | Admitting: Family Medicine

## 2021-12-18 ENCOUNTER — Ambulatory Visit (INDEPENDENT_AMBULATORY_CARE_PROVIDER_SITE_OTHER): Payer: Medicare Other | Admitting: Family Medicine

## 2021-12-18 VITALS — BP 123/71 | HR 84 | Temp 98.1°F | Wt 283.6 lb

## 2021-12-18 DIAGNOSIS — E1122 Type 2 diabetes mellitus with diabetic chronic kidney disease: Secondary | ICD-10-CM

## 2021-12-18 DIAGNOSIS — Z23 Encounter for immunization: Secondary | ICD-10-CM | POA: Diagnosis not present

## 2021-12-18 DIAGNOSIS — Z794 Long term (current) use of insulin: Secondary | ICD-10-CM

## 2021-12-18 DIAGNOSIS — L97529 Non-pressure chronic ulcer of other part of left foot with unspecified severity: Secondary | ICD-10-CM

## 2021-12-18 DIAGNOSIS — S91302A Unspecified open wound, left foot, initial encounter: Secondary | ICD-10-CM | POA: Diagnosis not present

## 2021-12-18 DIAGNOSIS — N183 Chronic kidney disease, stage 3 unspecified: Secondary | ICD-10-CM

## 2021-12-18 DIAGNOSIS — E11621 Type 2 diabetes mellitus with foot ulcer: Secondary | ICD-10-CM

## 2021-12-18 LAB — MICROALBUMIN, URINE WAIVED
Creatinine, Urine Waived: 50 mg/dL (ref 10–300)
Microalb, Ur Waived: 10 mg/L (ref 0–19)
Microalb/Creat Ratio: <30 mg/g (ref <30)

## 2021-12-18 NOTE — Assessment & Plan Note (Signed)
Will get him into see podiatry ASAP. Appointment scheduled.  ?

## 2021-12-18 NOTE — Assessment & Plan Note (Signed)
Not under good control with a1c of 11.6. Following with endocrinology. Call with any concerns. Continue to monitor.  ?

## 2021-12-18 NOTE — Progress Notes (Signed)
? ?BP 123/71   Pulse 84   Temp 98.1 ?F (36.7 ?C)   Wt 283 lb 9.6 oz (128.6 kg)   SpO2 96%   BMI 40.69 kg/m?   ? ?Subjective:  ? ? Patient ID: Andrew Pruitt, male    DOB: 1955-11-05, 66 y.o.   MRN: [id number] ? ?HPI: ?Elizah Mierzwa is a 66 y.o. male ? ?Chief Complaint  ?Patient presents with  ? Toe concern  ?  Patient states he has a sore between his big toe and second toe, on his left foot. Patient states it has been there for a few weeks.   ? ?SKIN LESION ?Duration: couple of weeks ?Location: bottom of L foot between his toes ?Painful: no ?Itching: no ?Onset: gradual ?Context: bigger ?History of skin cancer: no ?History of precancerous skin lesions: no ?Family history of skin cancer: no ? ?Relevant past medical, surgical, family and social history reviewed and updated as indicated. Interim medical history since our last visit reviewed. ?Allergies and medications reviewed and updated. ? ?Review of Systems  ?Constitutional: Negative.   ?Respiratory: Negative.    ?Cardiovascular: Negative.   ?Gastrointestinal: Negative.   ?Musculoskeletal: Negative.   ?Skin:  Positive for wound. Negative for color change, pallor and rash.  ?Psychiatric/Behavioral: Negative.    ? ?Per HPI unless specifically indicated above ? ?   ?Objective:  ?  ?BP 123/71   Pulse 84   Temp 98.1 ?F (36.7 ?C)   Wt 283 lb 9.6 oz (128.6 kg)   SpO2 96%   BMI 40.69 kg/m?   ?Wt Readings from Last 3 Encounters:  ?12/18/21 283 lb 9.6 oz (128.6 kg)  ?10/09/21 284 lb 3.2 oz (128.9 kg)  ?10/06/21 283 lb 9.6 oz (128.6 kg)  ?  ?Physical Exam ?Vitals and nursing note reviewed.  ?Constitutional:   ?   General: He is not in acute distress. ?   Appearance: Normal appearance. He is not ill-appearing, toxic-appearing or diaphoretic.  ?HENT:  ?   Head: Normocephalic and atraumatic.  ?   Right Ear: External ear normal.  ?   Left Ear: External ear normal.  ?   Nose: Nose normal.  ?   Mouth/Throat:  ?   Mouth: Mucous membranes are moist.  ?   Pharynx: Oropharynx is  clear.  ?Eyes:  ?   General: No scleral icterus.    ?   Right eye: No discharge.     ?   Left eye: No discharge.  ?   Extraocular Movements: Extraocular movements intact.  ?   Conjunctiva/sclera: Conjunctivae normal.  ?   Pupils: Pupils are equal, round, and reactive to light.  ?Cardiovascular:  ?   Rate and Rhythm: Normal rate and regular rhythm.  ?   Pulses: Normal pulses.  ?   Heart sounds: Normal heart sounds. No murmur heard. ?  No friction rub. No gallop.  ?Pulmonary:  ?   Effort: Pulmonary effort is normal. No respiratory distress.  ?   Breath sounds: Normal breath sounds. No stridor. No wheezing, rhonchi or rales.  ?Chest:  ?   Chest wall: No tenderness.  ?Musculoskeletal:     ?   General: Normal range of motion.  ?   Cervical back: Normal range of motion and neck supple.  ?Skin: ?   General: Skin is warm and dry.  ?   Capillary Refill: Capillary refill takes less than 2 seconds.  ?   Coloration: Skin is not jaundiced or pale.  ?   Findings: No  bruising, erythema, lesion or rash.  ?   Comments: Open wound, deep with black eschar on plantar surface of L foo  ?Neurological:  ?   General: No focal deficit present.  ?   Mental Status: He is alert and oriented to person, place, and time. Mental status is at baseline.  ?Psychiatric:     ?   Mood and Affect: Mood normal.     ?   Behavior: Behavior normal.     ?   Thought Content: Thought content normal.     ?   Judgment: Judgment normal.  ? ? ?Results for orders placed or performed in visit on 10/06/21  ?POCT glycosylated hemoglobin (Hb A1C)  ?Result Value Ref Range  ? Hemoglobin A1C 11.6 (A) 4.0 - 5.6 %  ? HbA1c POC (<> result, manual entry)    ? HbA1c, POC (prediabetic range)    ? HbA1c, POC (controlled diabetic range)    ? ?   ?Assessment & Plan:  ? ?Problem List Items Addressed This Visit   ? ?  ? Endocrine  ? Type 2 diabetes mellitus ([place])  ?  Not under good control with a1c of 11.6. Following with endocrinology. Call with any concerns. Continue to monitor.  ?   ?  ? Relevant Orders  ? Microalbumin, Urine Waived  ? Diabetic ulcer of toe of left foot associated with type 2 diabetes mellitus ([place]) - Primary  ?  Will get him into see podiatry ASAP. Appointment scheduled.  ?  ?  ? ?Other Visit Diagnoses   ? ? Open wound of left foot, initial encounter      ? Due for Td. Given today.   ? Relevant Orders  ? Td : Tetanus/diphtheria >7yo Preservative  free  ? ?  ?  ? ?Follow up plan: ?Return As scheduled with PCP. ? ? ? ? ? ?

## 2021-12-18 NOTE — Patient Instructions (Signed)
[place] Location ?Tomorrow, 12/19/21 3:15PM ?

## 2021-12-19 ENCOUNTER — Other Ambulatory Visit: Payer: Self-pay

## 2021-12-19 ENCOUNTER — Encounter: Payer: Self-pay | Admitting: Internal Medicine

## 2021-12-19 ENCOUNTER — Other Ambulatory Visit: Payer: Self-pay | Admitting: Nurse Practitioner

## 2021-12-19 ENCOUNTER — Inpatient Hospital Stay
Admission: EM | Admit: 2021-12-19 | Discharge: 2021-12-21 | DRG: 246 | Disposition: A | Discharge status: Home / Self Care | Payer: Medicare Other | Attending: Internal Medicine | Admitting: Internal Medicine

## 2021-12-19 ENCOUNTER — Ambulatory Visit (INDEPENDENT_AMBULATORY_CARE_PROVIDER_SITE_OTHER): Payer: Medicare Other | Admitting: Podiatry

## 2021-12-19 ENCOUNTER — Encounter: Payer: Self-pay | Admitting: Podiatry

## 2021-12-19 ENCOUNTER — Encounter
Admission: EM | Disposition: A | Discharge status: Home / Self Care | Payer: Self-pay | Source: Home / Self Care | Attending: Internal Medicine

## 2021-12-19 DIAGNOSIS — R778 Other specified abnormalities of plasma proteins: Secondary | ICD-10-CM | POA: Diagnosis not present

## 2021-12-19 DIAGNOSIS — N1832 Chronic kidney disease, stage 3b: Secondary | ICD-10-CM | POA: Diagnosis present

## 2021-12-19 DIAGNOSIS — Z955 Presence of coronary angioplasty implant and graft: Secondary | ICD-10-CM | POA: Diagnosis not present

## 2021-12-19 DIAGNOSIS — Z794 Long term (current) use of insulin: Secondary | ICD-10-CM

## 2021-12-19 DIAGNOSIS — E1165 Type 2 diabetes mellitus with hyperglycemia: Secondary | ICD-10-CM | POA: Diagnosis present

## 2021-12-19 DIAGNOSIS — E1140 Type 2 diabetes mellitus with diabetic neuropathy, unspecified: Secondary | ICD-10-CM | POA: Diagnosis present

## 2021-12-19 DIAGNOSIS — Z8249 Family history of ischemic heart disease and other diseases of the circulatory system: Secondary | ICD-10-CM | POA: Diagnosis not present

## 2021-12-19 DIAGNOSIS — F32A Depression, unspecified: Secondary | ICD-10-CM | POA: Diagnosis present

## 2021-12-19 DIAGNOSIS — I130 Hypertensive heart and chronic kidney disease with heart failure and stage 1 through stage 4 chronic kidney disease, or unspecified chronic kidney disease: Secondary | ICD-10-CM | POA: Diagnosis present

## 2021-12-19 DIAGNOSIS — I5021 Acute systolic (congestive) heart failure: Secondary | ICD-10-CM | POA: Diagnosis present

## 2021-12-19 DIAGNOSIS — I2510 Atherosclerotic heart disease of native coronary artery without angina pectoris: Secondary | ICD-10-CM | POA: Diagnosis present

## 2021-12-19 DIAGNOSIS — F339 Major depressive disorder, recurrent, unspecified: Secondary | ICD-10-CM | POA: Diagnosis present

## 2021-12-19 DIAGNOSIS — E1149 Type 2 diabetes mellitus with other diabetic neurological complication: Secondary | ICD-10-CM

## 2021-12-19 DIAGNOSIS — E113592 Type 2 diabetes mellitus with proliferative diabetic retinopathy without macular edema, left eye: Secondary | ICD-10-CM | POA: Diagnosis present

## 2021-12-19 DIAGNOSIS — E11621 Type 2 diabetes mellitus with foot ulcer: Secondary | ICD-10-CM

## 2021-12-19 DIAGNOSIS — S2242XA Multiple fractures of ribs, left side, initial encounter for closed fracture: Secondary | ICD-10-CM | POA: Diagnosis not present

## 2021-12-19 DIAGNOSIS — Z6841 Body Mass Index (BMI) 40.0 and over, adult: Secondary | ICD-10-CM | POA: Diagnosis not present

## 2021-12-19 DIAGNOSIS — E1169 Type 2 diabetes mellitus with other specified complication: Secondary | ICD-10-CM | POA: Diagnosis present

## 2021-12-19 DIAGNOSIS — E785 Hyperlipidemia, unspecified: Secondary | ICD-10-CM | POA: Diagnosis present

## 2021-12-19 DIAGNOSIS — I152 Hypertension secondary to endocrine disorders: Secondary | ICD-10-CM | POA: Diagnosis present

## 2021-12-19 DIAGNOSIS — L97522 Non-pressure chronic ulcer of other part of left foot with fat layer exposed: Secondary | ICD-10-CM

## 2021-12-19 DIAGNOSIS — Z83438 Family history of other disorder of lipoprotein metabolism and other lipidemia: Secondary | ICD-10-CM

## 2021-12-19 DIAGNOSIS — R0789 Other chest pain: Secondary | ICD-10-CM | POA: Diagnosis present

## 2021-12-19 DIAGNOSIS — Z820 Family history of epilepsy and other diseases of the nervous system: Secondary | ICD-10-CM

## 2021-12-19 DIAGNOSIS — Z8049 Family history of malignant neoplasm of other genital organs: Secondary | ICD-10-CM

## 2021-12-19 DIAGNOSIS — Z833 Family history of diabetes mellitus: Secondary | ICD-10-CM

## 2021-12-19 DIAGNOSIS — I639 Cerebral infarction, unspecified: Secondary | ICD-10-CM | POA: Diagnosis not present

## 2021-12-19 DIAGNOSIS — I999 Unspecified disorder of circulatory system: Secondary | ICD-10-CM | POA: Diagnosis not present

## 2021-12-19 DIAGNOSIS — E1122 Type 2 diabetes mellitus with diabetic chronic kidney disease: Secondary | ICD-10-CM | POA: Diagnosis present

## 2021-12-19 DIAGNOSIS — Z79899 Other long term (current) drug therapy: Secondary | ICD-10-CM | POA: Diagnosis not present

## 2021-12-19 DIAGNOSIS — E1159 Type 2 diabetes mellitus with other circulatory complications: Secondary | ICD-10-CM | POA: Diagnosis not present

## 2021-12-19 DIAGNOSIS — Z823 Family history of stroke: Secondary | ICD-10-CM

## 2021-12-19 DIAGNOSIS — I25118 Atherosclerotic heart disease of native coronary artery with other forms of angina pectoris: Secondary | ICD-10-CM | POA: Diagnosis not present

## 2021-12-19 DIAGNOSIS — I213 ST elevation (STEMI) myocardial infarction of unspecified site: Secondary | ICD-10-CM | POA: Diagnosis not present

## 2021-12-19 DIAGNOSIS — Z20822 Contact with and (suspected) exposure to covid-19: Secondary | ICD-10-CM | POA: Diagnosis present

## 2021-12-19 DIAGNOSIS — I6389 Other cerebral infarction: Secondary | ICD-10-CM | POA: Diagnosis not present

## 2021-12-19 DIAGNOSIS — I2102 ST elevation (STEMI) myocardial infarction involving left anterior descending coronary artery: Principal | ICD-10-CM | POA: Diagnosis present

## 2021-12-19 DIAGNOSIS — E13621 Other specified diabetes mellitus with foot ulcer: Secondary | ICD-10-CM

## 2021-12-19 DIAGNOSIS — E1142 Type 2 diabetes mellitus with diabetic polyneuropathy: Secondary | ICD-10-CM | POA: Diagnosis not present

## 2021-12-19 DIAGNOSIS — W19XXXA Unspecified fall, initial encounter: Secondary | ICD-10-CM | POA: Diagnosis not present

## 2021-12-19 DIAGNOSIS — E669 Obesity, unspecified: Secondary | ICD-10-CM | POA: Diagnosis present

## 2021-12-19 DIAGNOSIS — L97529 Non-pressure chronic ulcer of other part of left foot with unspecified severity: Secondary | ICD-10-CM | POA: Diagnosis present

## 2021-12-19 DIAGNOSIS — I255 Ischemic cardiomyopathy: Secondary | ICD-10-CM | POA: Diagnosis present

## 2021-12-19 HISTORY — PX: LEFT HEART CATH AND CORONARY ANGIOGRAPHY: CATH118249

## 2021-12-19 HISTORY — PX: CORONARY/GRAFT ACUTE MI REVASCULARIZATION: CATH118305

## 2021-12-19 LAB — CBC WITH DIFFERENTIAL/PLATELET
Abs Immature Granulocytes: 0.02 10*3/uL (ref 0.00–0.07)
Basophils Absolute: 0 10*3/uL (ref 0.0–0.1)
Basophils Relative: 1 %
Eosinophils Absolute: 0.1 10*3/uL (ref 0.0–0.5)
Eosinophils Relative: 1 %
HCT: 45.3 % (ref 39.0–52.0)
Hemoglobin: 15.6 g/dL (ref 13.0–17.0)
Immature Granulocytes: 0 %
Lymphocytes Relative: 34 %
Lymphs Abs: 2.5 10*3/uL (ref 0.7–4.0)
MCH: 30.3 pg (ref 26.0–34.0)
MCHC: 34.4 g/dL (ref 30.0–36.0)
MCV: 88 fL (ref 80.0–100.0)
Monocytes Absolute: 0.7 10*3/uL (ref 0.1–1.0)
Monocytes Relative: 10 %
Neutro Abs: 3.9 10*3/uL (ref 1.7–7.7)
Neutrophils Relative %: 54 %
Platelets: 306 10*3/uL (ref 150–400)
RBC: 5.15 MIL/uL (ref 4.22–5.81)
RDW: 13.2 % (ref 11.5–15.5)
WBC: 7.3 10*3/uL (ref 4.0–10.5)
nRBC: 0 % (ref 0.0–0.2)

## 2021-12-19 LAB — POCT ACTIVATED CLOTTING TIME
Activated Clotting Time: 239 seconds
Activated Clotting Time: 269 seconds

## 2021-12-19 LAB — PROTIME-INR
INR: 0.9 (ref 0.8–1.2)
Prothrombin Time: 11.9 seconds (ref 11.4–15.2)

## 2021-12-19 LAB — COMPREHENSIVE METABOLIC PANEL
ALT: 53 U/L — ABNORMAL HIGH (ref 0–44)
AST: 28 U/L (ref 15–41)
Albumin: 3.6 g/dL (ref 3.5–5.0)
Alkaline Phosphatase: 52 U/L (ref 38–126)
Anion gap: 11 (ref 5–15)
BUN: 27 mg/dL — ABNORMAL HIGH (ref 8–23)
CO2: 26 mmol/L (ref 22–32)
Calcium: 9 mg/dL (ref 8.9–10.3)
Chloride: 97 mmol/L — ABNORMAL LOW (ref 98–111)
Creatinine, Ser: 2.09 mg/dL — ABNORMAL HIGH (ref 0.61–1.24)
GFR, Estimated: 34 mL/min — ABNORMAL LOW (ref >60)
Glucose, Bld: 472 mg/dL — ABNORMAL HIGH (ref 70–99)
Potassium: 4 mmol/L (ref 3.5–5.1)
Sodium: 134 mmol/L — ABNORMAL LOW (ref 135–145)
Total Bilirubin: 0.7 mg/dL (ref 0.3–1.2)
Total Protein: 7.2 g/dL (ref 6.5–8.1)

## 2021-12-19 LAB — LIPID PANEL
Cholesterol: 239 mg/dL — ABNORMAL HIGH (ref 0–200)
HDL: 26 mg/dL — ABNORMAL LOW (ref >40)
LDL Cholesterol: UNDETERMINED mg/dL (ref 0–99)
Total CHOL/HDL Ratio: 9.2 RATIO
Triglycerides: 711 mg/dL — ABNORMAL HIGH (ref <150)
VLDL: UNDETERMINED mg/dL (ref 0–40)

## 2021-12-19 LAB — APTT: aPTT: 24 seconds (ref 24–36)

## 2021-12-19 LAB — RESP PANEL BY RT-PCR (FLU A&B, COVID) ARPGX2
Influenza A by PCR: NEGATIVE
Influenza B by PCR: NEGATIVE
SARS Coronavirus 2 by RT PCR: NEGATIVE

## 2021-12-19 LAB — TROPONIN I (HIGH SENSITIVITY): Troponin I (High Sensitivity): 11 ng/L (ref <18)

## 2021-12-19 SURGERY — CORONARY/GRAFT ACUTE MI REVASCULARIZATION
Anesthesia: Moderate Sedation

## 2021-12-19 MED ORDER — ONDANSETRON HCL 4 MG/2ML IJ SOLN: 4.0000 mg | Freq: Four times a day (QID) | INTRAMUSCULAR | Status: DC | PRN

## 2021-12-19 MED ORDER — HEPARIN (PORCINE) 25000 UT/250ML-% IV SOLN: 1400.0000 [IU]/h | INTRAVENOUS | Status: DC

## 2021-12-19 MED ORDER — HYDRALAZINE HCL 20 MG/ML IJ SOLN: 5.0000 mg | Freq: Four times a day (QID) | INTRAMUSCULAR | Status: DC | PRN

## 2021-12-19 MED ORDER — SODIUM CHLORIDE 0.9% FLUSH
3.0000 mL | Freq: Two times a day (BID) | INTRAVENOUS | Status: DC
  Administered 2021-12-20 – 2021-12-21 (×4): 3 mL via INTRAVENOUS

## 2021-12-19 MED ORDER — NITROGLYCERIN 1 MG/10 ML FOR IR/CATH LAB
INTRA_ARTERIAL | Status: DC | PRN
Start: 2021-12-19 — End: 2021-12-19
  Administered 2021-12-19 (×2): 200 ug

## 2021-12-19 MED ORDER — TICAGRELOR 60 MG PO TABS
ORAL_TABLET | ORAL | Status: DC | PRN
  Administered 2021-12-19: 180 mg via ORAL

## 2021-12-19 MED ORDER — ONDANSETRON HCL 4 MG/2ML IJ SOLN
4.0000 mg | Freq: Four times a day (QID) | INTRAMUSCULAR | Status: DC | PRN
  Administered 2021-12-20: 4 mg via INTRAVENOUS
  Filled 2021-12-19 (×2): qty 2

## 2021-12-19 MED ORDER — SODIUM CHLORIDE 0.9 % IV SOLN: 250.0000 mL | INTRAVENOUS | Status: DC | PRN

## 2021-12-19 MED ORDER — HYDRALAZINE HCL 20 MG/ML IJ SOLN
10.0000 mg | INTRAMUSCULAR | Status: DC | PRN
  Administered 2021-12-20: 10 mg via INTRAVENOUS
  Filled 2021-12-19: qty 1

## 2021-12-19 MED ORDER — VENLAFAXINE HCL ER 75 MG PO CP24
75.0000 mg | ORAL_CAPSULE | Freq: Every day | ORAL | Status: DC
  Administered 2021-12-20 – 2021-12-21 (×2): 75 mg via ORAL
  Filled 2021-12-19 (×2): qty 1

## 2021-12-19 MED ORDER — LIDOCAINE HCL 1 % IJ SOLN
INTRAMUSCULAR | Status: AC
  Filled 2021-12-19: qty 20

## 2021-12-19 MED ORDER — ASPIRIN EC 81 MG PO TBEC: 81.0000 mg | DELAYED_RELEASE_TABLET | Freq: Every day | ORAL | Status: DC

## 2021-12-19 MED ORDER — HEPARIN SODIUM (PORCINE) 5000 UNIT/ML IJ SOLN
4000.0000 [IU] | Freq: Once | INTRAMUSCULAR | Status: AC
  Administered 2021-12-19: 4000 [IU] via INTRAVENOUS

## 2021-12-19 MED ORDER — TIROFIBAN HCL IV 12.5 MG/250 ML
INTRAVENOUS | Status: AC
  Filled 2021-12-19: qty 250

## 2021-12-19 MED ORDER — TIROFIBAN HCL IN NACL 5-0.9 MG/100ML-% IV SOLN
0.0750 ug/kg/min | INTRAVENOUS | Status: DC
Start: 2021-12-19 — End: 2021-12-20
  Filled 2021-12-19: qty 100

## 2021-12-19 MED ORDER — HEPARIN (PORCINE) IN NACL 1000-0.9 UT/500ML-% IV SOLN
INTRAVENOUS | Status: DC | PRN
  Administered 2021-12-19 (×2): 500 mL

## 2021-12-19 MED ORDER — VERAPAMIL HCL 2.5 MG/ML IV SOLN
INTRAVENOUS | Status: DC | PRN
Start: 2021-12-19 — End: 2021-12-19
  Administered 2021-12-19: 2.5 mg via INTRACORONARY

## 2021-12-19 MED ORDER — HEPARIN SODIUM (PORCINE) 1000 UNIT/ML IJ SOLN
INTRAMUSCULAR | Status: AC
  Filled 2021-12-19: qty 10

## 2021-12-19 MED ORDER — INSULIN ASPART 100 UNIT/ML IJ SOLN
0.0000 [IU] | Freq: Three times a day (TID) | INTRAMUSCULAR | Status: DC
  Administered 2021-12-20: 20 [IU] via SUBCUTANEOUS
  Administered 2021-12-20: 11 [IU] via SUBCUTANEOUS
  Administered 2021-12-20: 20 [IU] via SUBCUTANEOUS
  Administered 2021-12-21 (×2): 7 [IU] via SUBCUTANEOUS
  Filled 2021-12-19 (×5): qty 1

## 2021-12-19 MED ORDER — TICAGRELOR 90 MG PO TABS
ORAL_TABLET | ORAL | Status: AC
  Filled 2021-12-19: qty 2

## 2021-12-19 MED ORDER — FUROSEMIDE 10 MG/ML IJ SOLN
INTRAMUSCULAR | Status: DC | PRN
  Administered 2021-12-19: 40 mg via INTRAVENOUS

## 2021-12-19 MED ORDER — MIDAZOLAM HCL 2 MG/2ML IJ SOLN
INTRAMUSCULAR | Status: DC | PRN
  Administered 2021-12-19 (×2): 1 mg via INTRAVENOUS

## 2021-12-19 MED ORDER — LIDOCAINE HCL (PF) 1 % IJ SOLN
INTRAMUSCULAR | Status: DC | PRN
Start: 2021-12-19 — End: 2021-12-19
  Administered 2021-12-19: 2 mL

## 2021-12-19 MED ORDER — TIROFIBAN (AGGRASTAT) BOLUS VIA INFUSION
INTRAVENOUS | Status: DC | PRN
  Administered 2021-12-19: 3487.5 ug via INTRAVENOUS

## 2021-12-19 MED ORDER — ASPIRIN 81 MG PO CHEW
81.0000 mg | CHEWABLE_TABLET | Freq: Every day | ORAL | Status: DC
  Administered 2021-12-20 – 2021-12-21 (×2): 81 mg via ORAL
  Filled 2021-12-19 (×2): qty 1

## 2021-12-19 MED ORDER — HEPARIN (PORCINE) IN NACL 1000-0.9 UT/500ML-% IV SOLN
INTRAVENOUS | Status: AC
  Filled 2021-12-19: qty 1000

## 2021-12-19 MED ORDER — VERAPAMIL HCL 2.5 MG/ML IV SOLN
INTRAVENOUS | Status: AC
  Filled 2021-12-19: qty 2

## 2021-12-19 MED ORDER — INSULIN DETEMIR 100 UNIT/ML ~~LOC~~ SOLN: 200.0000 [IU] | SUBCUTANEOUS | Status: DC

## 2021-12-19 MED ORDER — HEPARIN SODIUM (PORCINE) 1000 UNIT/ML IJ SOLN
INTRAMUSCULAR | Status: DC | PRN
  Administered 2021-12-19: 2000 [IU] via INTRAVENOUS
  Administered 2021-12-19: 6000 [IU] via INTRAVENOUS

## 2021-12-19 MED ORDER — INSULIN ASPART 100 UNIT/ML IJ SOLN: 0.0000 [IU] | Freq: Every day | INTRAMUSCULAR | Status: DC

## 2021-12-19 MED ORDER — METOPROLOL TARTRATE 25 MG PO TABS
12.5000 mg | ORAL_TABLET | Freq: Two times a day (BID) | ORAL | Status: DC
  Administered 2021-12-20: 12.5 mg via ORAL
  Filled 2021-12-19: qty 1

## 2021-12-19 MED ORDER — METOPROLOL TARTRATE 5 MG/5ML IV SOLN: 5.0000 mg | INTRAVENOUS | Status: DC | PRN

## 2021-12-19 MED ORDER — HEPARIN SODIUM (PORCINE) 5000 UNIT/ML IJ SOLN
5000.0000 [IU] | Freq: Three times a day (TID) | INTRAMUSCULAR | Status: DC
  Administered 2021-12-20 (×3): 5000 [IU] via SUBCUTANEOUS
  Filled 2021-12-19 (×3): qty 1

## 2021-12-19 MED ORDER — TICAGRELOR 90 MG PO TABS
90.0000 mg | ORAL_TABLET | Freq: Two times a day (BID) | ORAL | Status: DC
  Administered 2021-12-20 – 2021-12-21 (×3): 90 mg via ORAL
  Filled 2021-12-19 (×3): qty 1

## 2021-12-19 MED ORDER — CHLORHEXIDINE GLUCONATE CLOTH 2 % EX PADS
6.0000 | MEDICATED_PAD | Freq: Every day | CUTANEOUS | Status: DC
  Administered 2021-12-19 – 2021-12-21 (×3): 6 via TOPICAL

## 2021-12-19 MED ORDER — ACETAMINOPHEN 325 MG PO TABS: 650.0000 mg | ORAL_TABLET | ORAL | Status: DC | PRN

## 2021-12-19 MED ORDER — FENOFIBRATE 160 MG PO TABS
160.0000 mg | ORAL_TABLET | Freq: Every day | ORAL | Status: DC
  Administered 2021-12-20 – 2021-12-21 (×2): 160 mg via ORAL
  Filled 2021-12-19 (×2): qty 1

## 2021-12-19 MED ORDER — MIDAZOLAM HCL 2 MG/2ML IJ SOLN
INTRAMUSCULAR | Status: AC
  Filled 2021-12-19: qty 2

## 2021-12-19 MED ORDER — ATORVASTATIN CALCIUM 80 MG PO TABS
80.0000 mg | ORAL_TABLET | Freq: Every day | ORAL | Status: DC
  Administered 2021-12-20 – 2021-12-21 (×2): 80 mg via ORAL
  Filled 2021-12-19 (×2): qty 1

## 2021-12-19 MED ORDER — POLYETHYLENE GLYCOL 3350 17 G PO PACK: 17.0000 g | PACK | Freq: Every day | ORAL | Status: DC | PRN

## 2021-12-19 MED ORDER — NITROGLYCERIN 0.4 MG SL SUBL
0.4000 mg | SUBLINGUAL_TABLET | SUBLINGUAL | Status: DC | PRN
  Administered 2021-12-19: 0.4 mg via SUBLINGUAL

## 2021-12-19 MED ORDER — FENTANYL CITRATE (PF) 100 MCG/2ML IJ SOLN
INTRAMUSCULAR | Status: AC
Start: 2021-12-19 — End: ?
  Filled 2021-12-19: qty 2

## 2021-12-19 MED ORDER — GABAPENTIN 100 MG PO CAPS
100.0000 mg | ORAL_CAPSULE | Freq: Three times a day (TID) | ORAL | Status: DC
  Administered 2021-12-20 – 2021-12-21 (×5): 100 mg via ORAL
  Filled 2021-12-19 (×5): qty 1

## 2021-12-19 MED ORDER — TIROFIBAN HCL IN NACL 5-0.9 MG/100ML-% IV SOLN
INTRAVENOUS | Status: AC | PRN
  Administered 2021-12-19: .075 ug/kg/min via INTRAVENOUS

## 2021-12-19 MED ORDER — IOHEXOL 350 MG/ML SOLN
INTRAVENOUS | Status: DC | PRN
  Administered 2021-12-19: 96 mL

## 2021-12-19 MED ORDER — SODIUM CHLORIDE 0.9 % IV SOLN: INTRAVENOUS | Status: DC

## 2021-12-19 MED ORDER — INSULIN ASPART 100 UNIT/ML IJ SOLN: 0.0000 [IU] | Freq: Three times a day (TID) | INTRAMUSCULAR | Status: DC

## 2021-12-19 MED ORDER — SODIUM CHLORIDE 0.9% FLUSH
3.0000 mL | INTRAVENOUS | Status: DC | PRN
  Administered 2021-12-21 (×2): 3 mL via INTRAVENOUS

## 2021-12-19 MED ORDER — INSULIN ASPART 100 UNIT/ML IJ SOLN
0.0000 [IU] | Freq: Every day | INTRAMUSCULAR | Status: DC
  Administered 2021-12-20: 5 [IU] via SUBCUTANEOUS
  Filled 2021-12-19: qty 1

## 2021-12-19 MED ORDER — DAPAGLIFLOZIN PROPANEDIOL 10 MG PO TABS
10.0000 mg | ORAL_TABLET | Freq: Every day | ORAL | Status: DC
  Administered 2021-12-20 – 2021-12-21 (×2): 10 mg via ORAL
  Filled 2021-12-19 (×2): qty 1

## 2021-12-19 MED ORDER — INSULIN GLARGINE-YFGN 100 UNIT/ML ~~LOC~~ SOLN
20.0000 [IU] | Freq: Once | SUBCUTANEOUS | Status: AC
Start: 2021-12-20 — End: 2021-12-20
  Administered 2021-12-20: 20 [IU] via SUBCUTANEOUS
  Filled 2021-12-19: qty 0.2

## 2021-12-19 MED ORDER — FENTANYL CITRATE (PF) 100 MCG/2ML IJ SOLN
INTRAMUSCULAR | Status: DC | PRN
  Administered 2021-12-19 (×2): 25 ug via INTRAVENOUS

## 2021-12-19 MED ORDER — FUROSEMIDE 10 MG/ML IJ SOLN
INTRAMUSCULAR | Status: AC
  Filled 2021-12-19: qty 4

## 2021-12-19 SURGICAL SUPPLY — 26 items
BALLN SCOREFLEX 2.50X15 (BALLOONS) ×2
BALLN TREK RX 2.5X12 (BALLOONS) ×2
BALLN ~~LOC~~ EUPHORA RX 3.0X15 (BALLOONS) ×2
BALLN ~~LOC~~ TREK RX 2.75X12 (BALLOONS) ×2
BALLOON SCOREFLEX 2.50X15 (BALLOONS) IMPLANT
BALLOON TREK RX 2.5X12 (BALLOONS) IMPLANT
BALLOON ~~LOC~~ EUPHORA RX 3.0X15 (BALLOONS) IMPLANT
BALLOON ~~LOC~~ TREK RX 2.75X12 (BALLOONS) IMPLANT
CATH INFINITI 5FR ANG PIGTAIL (CATHETERS) ×1 IMPLANT
CATH INFINITI JR4 5F (CATHETERS) ×1 IMPLANT
CATH LAUNCHER 6FR AL1 (CATHETERS) IMPLANT
CATHETER LAUNCHER 6FR AL1 (CATHETERS) ×2
DEVICE RAD COMP TR BAND LRG (VASCULAR PRODUCTS) ×1 IMPLANT
DRAPE BRACHIAL (DRAPES) ×1 IMPLANT
GLIDESHEATH SLEND SS 6F .021 (SHEATH) ×1 IMPLANT
GUIDEWIRE INQWIRE 1.5J.035X260 (WIRE) IMPLANT
INQWIRE 1.5J .035X260CM (WIRE) ×2
KIT ENCORE 26 ADVANTAGE (KITS) ×1 IMPLANT
PACK CARDIAC CATH (CUSTOM PROCEDURE TRAY) ×2 IMPLANT
PROTECTION STATION PRESSURIZED (MISCELLANEOUS) ×2
SET ATX SIMPLICITY (MISCELLANEOUS) ×1 IMPLANT
STATION PROTECTION PRESSURIZED (MISCELLANEOUS) IMPLANT
STENT ONYX FRONTIER 2.75X34 (Permanent Stent) ×1 IMPLANT
TUBING CIL FLEX 10 FLL-RA (TUBING) ×1 IMPLANT
WIRE HITORQ VERSACORE ST 145CM (WIRE) ×1 IMPLANT
WIRE RUNTHROUGH .014X180CM (WIRE) ×1 IMPLANT

## 2021-12-19 NOTE — ED Triage Notes (Signed)
Cardiologist at bedside.  

## 2021-12-19 NOTE — ED Provider Notes (Signed)
? ?[facility] ?Provider Note ? ? ? Event Date/Time  ? First MD Initiated Contact with Patient 12/19/21 2059   ?  (approximate) ? ? ?History  ? ?Code STEMI ? ? ?HPI ? ?Andrew Pruitt is a 66 y.o. male  who, per family practice note from yesterday was being seen for left foot wound, who comes to the emergency department today as a code STEMI. Patient states he was working on getting ready for bed tonight when he started having central chest pain.  Patient describes it as sharp.  It does not radiate into his neck back or arms.  It was accompanied by some hot flashes.  Patient denies any shortness of breath.  States he had similar pain before when he was a kid and was thought to be due to panic attacks.  The patient denies any history of cardiac issues but does state he has diabetes and high blood pressure. Patient was given aspirin and NTG by EMS.  ? ? ?Physical Exam  ? ?Triage Vital Signs: ?ED Triage Vitals  ?Enc Vitals Group  ?   BP 12/19/21 2057 137/88  ?   Pulse Rate 12/19/21 2057 71  ?   Resp 12/19/21 2057 16  ?   Temp 12/19/21 2057 98.4 ?F (36.9 ?C)  ?   Temp Source 12/19/21 2057 Oral  ?   SpO2 12/19/21 2057 97 %  ?   Weight 12/19/21 2058 (!) 307 lb 8.7 oz (139.5 kg)  ?   Height 12/19/21 2058 '5\' 10"'$  (1.778 m)  ?   Head Circumference --   ?   Peak Flow --   ?   Pain Score 12/19/21 2057 10  ? ?Most recent vital signs: ?Vitals:  ? 12/19/21 2057  ?BP: 137/88  ?Pulse: 71  ?Resp: 16  ?Temp: 98.4 ?F (36.9 ?C)  ?SpO2: 97%  ? ? ?General: Awake, no distress.  ?CV:  Good peripheral perfusion.  ?Resp:  Normal effort.  ?Abd:  No distention.  ?MSK:  Left foot wrapped in ace bandage. ? ? ?ED Results / Procedures / Treatments  ? ?Labs ?(all labs ordered are listed, but only abnormal results are displayed) ?Labs Reviewed - No data to display ? ? ?EKG ? ?INance Pear, attending physician, personally viewed and interpreted this EKG ? ?EKG Time: 2055 ?Rate: 72 ?Rhythm: sinus rhythm ?Axis:  normal ?Intervals: qtc 463 ?QRS: narrow, q waves v1, v2 ?ST changes: ST elevation v2 ?Impression: abnormal ekg ? ?RADIOLOGY ?None ? ? ?PROCEDURES: ? ?Critical Care performed: Yes, see critical care procedure note(s) ? ?Procedures ? ?CRITICAL CARE ?Performed by: Nance Pear ? ? ?Total critical care time: 15 minutes ? ?Critical care time was exclusive of separately billable procedures and treating other patients. ? ?Critical care was necessary to treat or prevent imminent or life-threatening deterioration. ? ?Critical care was time spent personally by me on the following activities: development of treatment plan with patient and/or surrogate as well as nursing, discussions with consultants, evaluation of patient's response to treatment, examination of patient, obtaining history from patient or surrogate, ordering and performing treatments and interventions, ordering and review of laboratory studies, ordering and review of radiographic studies, pulse oximetry and re-evaluation of patient's condition. ? ? ?MEDICATIONS ORDERED IN ED: ?Medications - No data to display ? ? ?IMPRESSION / MDM / ASSESSMENT AND PLAN / ED COURSE  ?I reviewed the triage vital signs and the nursing notes. ?             ?               ? ?  Differential diagnosis includes, but is not limited to, ACS, PE, dissection, pneumonia. ? ?Patient presents to the emergency department via EMS as code STEMI.  Patient was complaining of chest pain.  Twelve-lead by EMS is concerning for ST elevation in V1 through V3.  Patient was given aspirin and nitroglycerin by EMS.  EKG here in the emergency department shows clear ST elevation in V2.  Dr. Saunders Revel with cardiology did come to evaluate patient.  He will be taken emergently to the catheterization lab. ? ? ?FINAL CLINICAL IMPRESSION(S) / ED DIAGNOSES  ? ?Final diagnoses:  ?ST elevation myocardial infarction (STEMI), unspecified artery ([place])  ? ? ? ? ?Note:  This document was prepared using Dragon voice recognition  software and may include unintentional dictation errors. ? ? ?  ?Nance Pear, MD ?12/19/21 2115 ? ?

## 2021-12-19 NOTE — ED Notes (Signed)
Pt to cath lab.

## 2021-12-19 NOTE — Assessment & Plan Note (Addendum)
-   Venlafaxine 75 mg daily with breakfast. ?

## 2021-12-19 NOTE — Assessment & Plan Note (Addendum)
-   TOC has been consulted for medication assistance as patient endorses difficulty affording insulin ?- Increasing Levemir and adding Novolog per DM nurse recommendation ?- continue insulin SSI with at bedtime coverage ordered with obese dosing ?- Counseled patient on working with his PCP/endocrinologist and insurance in order to obtain long-acting insulin that is affordable to the patient ?- Goal inpatient blood glucose level is 140-180 ?

## 2021-12-19 NOTE — Hospital Course (Addendum)
[name] is a 66 year old with history of obesity, BMI of 38, hypertension, hyperlipidemia, insulin-dependent diabetes mellitus, poorly controlled, difficulty affording insulin, CKD 3B at baseline, who presents to the emergency department for chief concerns of chest pain. ? ?He was found to have STEMI. ? ?Initial vitals in the emergency department showed temperature of 98.4, respiration rate of 16, heart rate 71, blood pressure 137/88, SPO2 of 97% on room air. ? ?Serum sodium 134, potassium 4.0, chloride 97, bicarb 26, BUN 27, serum creatinine of 2.09, GFR 34, nonfasting blood glucose 472. ? ?WBC was 7.3, hemoglobin 15.6, platelets of 306. ? ?Lipid panel ordered showed cholesterol was 239, HDL 26, triglyceride 711. ? ?Troponin was 11. ? ?EKG in the emergency department showed ST depression in leads II, III and aVF with clear ST elevation in leads II. ? ?Per ED provider note, EMS EKG was concerning for ST elevation in leads V1 to V3.  EMS gave patient aspirin and nitroglycerin. ? ?Code STEMI was called and patient was taken to the Cath Lab. ? ?Left heart cath showed proximal RCA with 50% stenosis and mid RCA with 80% stenosis, patient is now status post PCI to the RCA. ? ?Patient was loaded on Brilinta and given a dose of Aggrastat. ? ?4/12: transfer to PCU, Podiatry c/s for LE wound ?

## 2021-12-19 NOTE — Consult Note (Signed)
?Cardiology Consultation:  ? ?Patient ID: Andrew Pruitt ?MRN: [id number]; DOB: [birth year] ? ?Admit date: 12/19/2021 ?Date of Consult: 12/19/2021 ? ?PCP:  Jon Billings, NP ?  ?[facility] Providers ?Cardiologist: New - Willie Loy ? ? ?Patient Profile:  ? ?Andrew Pruitt is a 66 y.o. male with a hx of type 2 diabetes mellitus complicated by recent foot ulcer, hypertension, hyperlipidemia, depression, and arthritis, who is being seen 12/19/2021 for the evaluation of chest pain and ST elevation on EKG at the request of Dr. Archie Balboa. ? ?History of Present Illness:  ? ?Andrew Pruitt reports developing sudden onset of severe, sharp substernal chest pain without radiation shortly after 8 PM as he was getting ready to go to bed.  He also had some nausea and experienced dry heaves.  His wife called 75.  EMS found him to have anterior ST elevation.  He was given aspirin and sublingual nitroglycerin with transient improvement in chest pain.  In the emergency department, he continued to complain of 10/10 substernal chest pain.  EKG again showed anterior ST elevation.  He reports having undergone a cardiac catheterization about 20 years ago for evaluation of "stress."  He believes it was normal. ? ? ?Past Medical History:  ?Diagnosis Date  ? Arthritis   ? Depression   ? Diabetes mellitus without complication ([place])   ? Hyperlipidemia   ? Hypertension   ? ? ?Past Surgical History:  ?Procedure Laterality Date  ? CATARACT EXTRACTION Right   ? CATARACT EXTRACTION Left   ? TONSILLECTOMY    ?  ? ?Home Medications:  ?Prior to Admission medications   ?Medication Sig Start Date Raelyn Racette Date Taking? Authorizing Provider  ?amLODipine (NORVASC) 5 MG tablet TAKE 1 TABLET BY MOUTH DAILY. FURTHER REFILLS WILL NEED TO COME FROM NEW PRIMARY CARE PROVIDER 12/19/21   Jon Billings, NP  ?atorvastatin (LIPITOR) 80 MG tablet Take 1 tablet (80 mg total) by mouth daily. 01/25/21   Jon Billings, NP  ?B-D UF III MINI PEN NEEDLES 31G X 5 MM MISC USE DAILY  07/19/20   Renato Shin, MD  ?Continuous Blood Gluc Receiver (FREESTYLE LIBRE 14 DAY READER) DEVI Use to check glucose levels TID; E11.9 02/17/20   Brunetta Jeans, PA-C  ?Continuous Blood Gluc Sensor (FREESTYLE LIBRE 14 DAY SENSOR) MISC Use to check glucose TID. Change every 14 days. E 11.9 02/17/20   Brunetta Jeans, PA-C  ?dapagliflozin propanediol (FARXIGA) 10 MG TABS tablet Take 1 tablet (10 mg total) by mouth daily before breakfast. 03/28/21   Jon Billings, NP  ?diclofenac Sodium (VOLTAREN) 1 % GEL Apply 2 g topically 4 (four) times daily. 12/21/20   Jon Billings, NP  ?fenofibrate (TRICOR) 145 MG tablet TAKE 1 TABLET BY MOUTH EVERY DAY 12/06/21   Jon Billings, NP  ?gabapentin (NEURONTIN) 100 MG capsule Take 1 capsule (100 mg total) by mouth 3 (three) times daily. 12/21/20   Jon Billings, NP  ?hydrochlorothiazide (HYDRODIURIL) 25 MG tablet TAKE 1 TABLET BY MOUTH EVERY DAY 07/08/21   Jon Billings, NP  ?insulin NPH Human (NOVOLIN N) 100 UNIT/ML injection Inject 2 mLs (200 Units total) into the skin every morning. 10/06/21   Renato Shin, MD  ?venlafaxine XR (EFFEXOR XR) 75 MG 24 hr capsule Take 1 capsule (75 mg total) by mouth daily with breakfast. 10/09/21   Jon Billings, NP  ? ? ?Inpatient Medications: ?Scheduled Meds: ? ?Continuous Infusions: ? sodium chloride    ? ?PRN Meds: ?nitroGLYCERIN ? ?Allergies:    ?Allergies  ?Allergen Reactions  ?  Niaspan [Niacin] Other (See Comments)  ?  Red skin, red eyes  ? Trazodone Other (See Comments)  ?  nightmares  ? ? ?Social History:   ?Social History  ? ?Socioeconomic History  ? Marital status: Married  ?  Spouse name: Not on file  ? Number of children: Not on file  ? Years of education: Not on file  ? Highest education level: Not on file  ?Occupational History  ? Not on file  ?Tobacco Use  ? Smoking status: Never  ? Smokeless tobacco: Never  ?Vaping Use  ? Vaping Use: Never used  ?Substance and Sexual Activity  ? Alcohol use: Not Currently  ?  Drug use: Never  ? Sexual activity: Yes  ?Other Topics Concern  ? Not on file  ?Social History Narrative  ? Not on file  ? ?Social Determinants of Health  ? ?Financial Resource Strain: Low Risk   ? Difficulty of Paying Living Expenses: Not very hard  ?Food Insecurity: No Food Insecurity  ? Worried About Charity fundraiser in the Last Year: Never true  ? Ran Out of Food in the Last Year: Never true  ?Transportation Needs: No Transportation Needs  ? Lack of Transportation (Medical): No  ? Lack of Transportation (Non-Medical): No  ?Physical Activity: Inactive  ? Days of Exercise per Week: 0 days  ? Minutes of Exercise per Session: 0 min  ?Stress: No Stress Concern Present  ? Feeling of Stress : Only a little  ?Social Connections: Moderately Isolated  ? Frequency of Communication with Friends and Family: More than three times a week  ? Frequency of Social Gatherings with Friends and Family: More than three times a week  ? Attends Religious Services: Never  ? Active Member of Clubs or Organizations: No  ? Attends Archivist Meetings: Never  ? Marital Status: Married  ?Intimate Partner Violence: Not At Risk  ? Fear of Current or Ex-Partner: No  ? Emotionally Abused: No  ? Physically Abused: No  ? Sexually Abused: No  ?  ?Family History:   ?Family History  ?Problem Relation Age of Onset  ? Cancer Mother   ?     Uterine   ? Early death Mother   ? Stroke Mother   ? Diabetes Father   ? Heart attack Father   ? Heart disease Father   ? Hyperlipidemia Father   ? Diabetes Brother   ? Alzheimer's disease Maternal Grandmother   ? Kidney failure Maternal Grandfather   ? Diabetes Paternal Grandfather   ? Diabetes Brother   ? Diabetes Brother   ? Colon cancer Neg Hx   ? Pancreatic cancer Neg Hx   ? Esophageal cancer Neg Hx   ?  ? ?ROS:  ?Unable to obtain due to patient's critical illness. ? ?Physical Exam/Data:  ? ?Vitals:  ? 12/19/21 2057 12/19/21 2058  ?BP: 137/88   ?Pulse: 71   ?Resp: 16   ?Temp: 98.4 ?F (36.9 ?C)    ?TempSrc: Oral   ?SpO2: 97%   ?Weight:  (!) 139.5 kg  ?Height:  '5\' 10"'$  (1.778 m)  ? ?No intake or output data in the 24 hours ending 12/19/21 2113 ? ?  12/19/2021  ?  8:58 PM 12/18/2021  ?  8:15 AM 10/09/2021  ? 10:17 AM  ?Last 3 Weights  ?Weight (lbs) 307 lb 8.7 oz 283 lb 9.6 oz 284 lb 3.2 oz  ?Weight (kg) 139.5 kg 128.64 kg 128.912 kg  ?   ?Body  mass index is 44.13 kg/m?.  ?General: Morbidly obese man, lying on gurney.  He appears uncomfortable. ?HEENT: normal ?Neck: No gross JVD, though body habitus limits evaluation. ?Vascular: 2+ radial pulses bilaterally. ?Cardiac: Distant heart sounds.  Regular rate and rhythm without murmurs. ?Lungs:  clear to auscultation bilaterally, no wheezing, rhonchi or rales  ?Abd: Obese but soft without tenderness. ?Ext: Left foot is wrapped with an Ace bandage. ?Musculoskeletal:  No deformities. ?Skin: warm and dry  ?Neuro:  CNs 2-12 grossly intact, no focal abnormalities noted ?Psych: Flat affect ? ?EKG:  The EKG was personally reviewed and demonstrates: Normal sinus rhythm with poor R wave progression, anterior ST elevation and reciprocal ST depression in the inferior leads. ?Telemetry:  Telemetry was personally reviewed and demonstrates: Sinus rhythm with PVCs. ? ?Relevant CV Studies: ?None. ? ?Laboratory Data: ? ?High Sensitivity Troponin:  No results for input(s): TROPONINIHS in the last 720 hours.   ?ChemistryNo results for input(s): NA, K, CL, CO2, GLUCOSE, BUN, CREATININE, CALCIUM, MG, GFRNONAA, GFRAA, ANIONGAP in the last 168 hours.  ?No results for input(s): PROT, ALBUMIN, AST, ALT, ALKPHOS, BILITOT in the last 168 hours. ?Lipids No results for input(s): CHOL, TRIG, HDL, LABVLDL, LDLCALC, CHOLHDL in the last 168 hours.  ?HematologyNo results for input(s): WBC, RBC, HGB, HCT, MCV, MCH, MCHC, RDW, PLT in the last 168 hours. ?Thyroid No results for input(s): TSH, FREET4 in the last 168 hours.  ?BNPNo results for input(s): BNP, PROBNP in the last 168 hours.  ?DDimer No results  for input(s): DDIMER in the last 168 hours. ? ? ?Radiology/Studies:  ?No results found. ? ? ?Assessment and Plan:  ? ?STEMI: ?Andrew Pruitt presents with sudden onset of chest pain approximately 1 hour prior to arri

## 2021-12-19 NOTE — Progress Notes (Signed)
ANTICOAGULATION CONSULT NOTE - Initial Consult ? ?Pharmacy Consult for Tirofiban  ?Indication: STEMI/Post Cath ? ?Allergies  ?Allergen Reactions  ? Niaspan [Niacin] Other (See Comments)  ?  Red skin, red eyes  ? Trazodone Other (See Comments)  ?  nightmares  ? ? ?Patient Measurements: ?Height: 6' (182.9 cm) ?Weight: 128.9 kg (284 lb 2.8 oz) ?IBW/kg (Calculated) : 77.6 ?Heparin Dosing Weight:  ? ?Vital Signs: ?Temp: 97.7 ?F (36.5 ?C) (04/11 2230) ?Temp Source: Oral (04/11 2230) ?BP: 150/86 (04/11 2230) ?Pulse Rate: 81 (04/11 2230) ? ?Labs: ?Recent Labs  ?  12/19/21 ?2054  ?HGB 15.6  ?HCT 45.3  ?PLT 306  ?APTT 24  ?LABPROT 11.9  ?INR 0.9  ?CREATININE 2.09*  ?TROPONINIHS 11  ? ? ?Estimated Creatinine Clearance: 48.2 mL/min (A) (by C-G formula based on SCr of 2.09 mg/dL (H)). ? ? ?Medical History: ?Past Medical History:  ?Diagnosis Date  ? Arthritis   ? Depression   ? Diabetes mellitus without complication ([place])   ? Hyperlipidemia   ? Hypertension   ? ? ?Medications:  ?Medications Prior to Admission  ?Medication Sig Dispense Refill Last Dose  ? amLODipine (NORVASC) 5 MG tablet TAKE 1 TABLET BY MOUTH DAILY. FURTHER REFILLS WILL NEED TO COME FROM NEW PRIMARY CARE PROVIDER 90 tablet 0   ? atorvastatin (LIPITOR) 80 MG tablet Take 1 tablet (80 mg total) by mouth daily. 90 tablet 3   ? B-D UF III MINI PEN NEEDLES 31G X 5 MM MISC USE DAILY 100 each 0   ? Continuous Blood Gluc Receiver (FREESTYLE LIBRE 14 DAY READER) DEVI Use to check glucose levels TID; E11.9 1 each 0   ? Continuous Blood Gluc Sensor (FREESTYLE LIBRE 14 DAY SENSOR) MISC Use to check glucose TID. Change every 14 days. E 11.9 6 each 3   ? dapagliflozin propanediol (FARXIGA) 10 MG TABS tablet Take 1 tablet (10 mg total) by mouth daily before breakfast. 90 tablet 1   ? diclofenac Sodium (VOLTAREN) 1 % GEL Apply 2 g topically 4 (four) times daily. 100 g 1   ? fenofibrate (TRICOR) 145 MG tablet TAKE 1 TABLET BY MOUTH EVERY DAY 90 tablet 0   ? gabapentin (NEURONTIN)  100 MG capsule Take 1 capsule (100 mg total) by mouth 3 (three) times daily. 90 capsule 0   ? hydrochlorothiazide (HYDRODIURIL) 25 MG tablet TAKE 1 TABLET BY MOUTH EVERY DAY 90 tablet 0   ? insulin NPH Human (NOVOLIN N) 100 UNIT/ML injection Inject 2 mLs (200 Units total) into the skin every morning. 200 mL 3   ? venlafaxine XR (EFFEXOR XR) 75 MG 24 hr capsule Take 1 capsule (75 mg total) by mouth daily with breakfast. 90 capsule 1   ? ? ?Assessment: ?Pharmacy consulted to dose tirofiban in this 66 year old male presenting with STEMI,  s/p cath.    ?CrCl = 48.2 ml/min  ? ?Goal of Therapy:  ? ? ?  ?Plan:  ?Tirofiban 25 mcg/kg (3,487.5 mcg) IV X 1 bolus given in cath lab on 4/11 @ 2140. ?Tirofiban 0.075 mcg/kg (11.6 ml/hr) IV started in cath lab on 4/11 @ 2140.  Per MD request, will run for 4 hrs, will d/c drip on 4/12 @ 0200.  ? ? ?Corlis Angelica D ?12/19/2021,10:56 PM ? ? ?

## 2021-12-19 NOTE — Consult Note (Signed)
ANTICOAGULATION CONSULT NOTE - Initial Consult ? ?Pharmacy Consult for Heparin Infusion ?Indication:  STEMI ? ?Allergies  ?Allergen Reactions  ? Niaspan [Niacin] Other (See Comments)  ?  Red skin, red eyes  ? Trazodone Other (See Comments)  ?  nightmares  ? ? ?Patient Measurements: ?Height: '5\' 10"'$  (177.8 cm) ?Weight: (!) 139.5 kg (307 lb 8.7 oz) ?IBW/kg (Calculated) : 73 ?Heparin Dosing Weight: 105.7 kg ? ?Vital Signs: ?Temp: 98.4 ?F (36.9 ?C) (04/11 2057) ?Temp Source: Oral (04/11 2057) ?BP: 137/88 (04/11 2057) ?Pulse Rate: 71 (04/11 2057) ? ?Labs: ?Recent Labs  ?  12/19/21 ?2054  ?HGB 15.6  ?HCT 45.3  ?PLT 306  ?APTT 24  ?LABPROT 11.9  ?INR 0.9  ?CREATININE 2.09*  ?TROPONINIHS 11  ? ? ?Estimated Creatinine Clearance: 49 mL/min (A) (by C-G formula based on SCr of 2.09 mg/dL (H)). ? ? ?Medical History: ?Past Medical History:  ?Diagnosis Date  ? Arthritis   ? Depression   ? Diabetes mellitus without complication ([place])   ? Hyperlipidemia   ? Hypertension   ? ? ?Medications:  ?No history of chronic anticoagulant use. ? ?Assessment: ?Pharmacy has been consulted to initiate and monitor heparin infusion in 65yo patient admitted with sudden onset chest pain 1 hour prior to arrival to the ED. Per cardiology, EKG concerning for anterior STEMI with plans for emergent cardiac catheterization and possible PCI. Baseline labs: aPTT 24sec, INR 0.9, Plts 306, Hgb 15.6 ? ?Goal of Therapy:  ?Heparin level 0.3-0.7 units/ml ?Monitor platelets by anticoagulation protocol: Yes ?  ?Plan:  ?Patient received 4000 units bolus x 1 in ED prior to transfer to cath lab, where he was then started on a tirofiban infusion. ?Order placed for heparin infusion at 1400 units/hr but will need to follow up with cardiologist regarding plans post-cath. ?Check anti-Xa level in 6 hours and daily while on heparin ?Continue to monitor H&H and platelets ? ?Adah Stoneberg A Stanislav Gervase ?12/19/2021,10:02 PM ? ? ?

## 2021-12-19 NOTE — ED Notes (Signed)
Pt's wife at bedside. All pt's belongings given to wife.  ?

## 2021-12-19 NOTE — Telephone Encounter (Signed)
Requested Prescriptions  ?Pending Prescriptions Disp Refills  ?? amLODipine (NORVASC) 5 MG tablet [Pharmacy Med Name: AMLODIPINE BESYLATE 5 MG TAB] 90 tablet 0  ?  Sig: TAKE 1 TABLET BY MOUTH DAILY. FURTHER REFILLS WILL NEED TO COME FROM NEW PRIMARY CARE PROVIDER  ?  ? Cardiovascular: Calcium Channel Blockers 2 Passed - 12/19/2021  2:30 AM  ?  ?  Passed - Last BP in normal range  ?  BP Readings from Last 1 Encounters:  ?12/18/21 123/71  ?   ?  ?  Passed - Last Heart Rate in normal range  ?  Pulse Readings from Last 1 Encounters:  ?12/18/21 84  ?   ?  ?  Passed - Valid encounter within last 6 months  ?  Recent Outpatient Visits   ?      ? Yesterday Diabetic ulcer of toe of left foot associated with type 2 diabetes mellitus, unspecified ulcer stage ([place])  ? [place] P, DO  ? 2 months ago Depression, unspecified depression type  ? [place], NP  ? 3 months ago Depression, unspecified depression type  ? [place], NP  ? 4 months ago Hypertension associated with diabetes [facility])  ? [place], NP  ? 7 months ago Uncontrolled type II diabetes mellitus with chronic kidney disease ([place])  ? [facility] Jon Billings, NP  ?  ?  ?Future Appointments   ?        ? In 2 weeks Jon Billings, NP [facility]  ?  ? ?  ?  ?  ? ?

## 2021-12-19 NOTE — Assessment & Plan Note (Addendum)
-   Continue atorvastatin 80 mg daily. 

## 2021-12-19 NOTE — ED Notes (Signed)
Cardiologist at bedside.  

## 2021-12-19 NOTE — ED Triage Notes (Signed)
Pt had sudden onset of CP approx 1 hour PTA. No known cardiac hx. Pt was getting ready to go to bed when pain started. Central and does not radiate. Pt given 324 ASA and 2 nitro with no relief. Hx diabetes and HTN.  ?

## 2021-12-19 NOTE — Assessment & Plan Note (Deleted)
-   Status post left heart cath showing 50% stenosis of the proximal RCA and 80% stenosis of the mid RCA ?- Status post PCI to RCA ?- Status post Brilinta load and Aggrastat ?- Per cardiologist, during catheterization, patient's EF was around 30%, patient received a dose of furosemide ?- Continue Farxiga 10 mg daily ?- Complete echo has been ordered for tomorrow ?- Cardiology will continue to follow ?- Admit to ICU inpatient ?

## 2021-12-19 NOTE — Assessment & Plan Note (Addendum)
-   Metoprolol switched to Coreg by cardio for better BP control ?

## 2021-12-19 NOTE — H&P (Signed)
?History and Physical  ? ?Andrew Pruitt IRJ:[id number] DOB: 1956-05-22 DOA: 12/19/2021 ? ?PCP: Andrew Billings, NP  ?Outpatient Specialists: Dr. Loanne Drilling, endocrinology ?Patient coming from: Home via EMS ? ?I have personally briefly reviewed patient's old medical records in [place]. ? ?Chief Concern: Chest pain ? ?HPI: [name] is a 66 year old with history of obesity, BMI of 38, hypertension, hyperlipidemia, insulin-dependent diabetes mellitus, poorly controlled, difficulty affording insulin, CKD 3B at baseline, who presents to the emergency department for chief concerns of chest pain. ? ?He was found to have STEMI. ? ?Initial vitals in the emergency department showed temperature of 98.4, respiration rate of 16, heart rate 71, blood pressure 137/88, SPO2 of 97% on room air. ? ?Serum sodium 134, potassium 4.0, chloride 97, bicarb 26, BUN 27, serum creatinine of 2.09, GFR 34, nonfasting blood glucose 472. ? ?WBC was 7.3, hemoglobin 15.6, platelets of 306. ? ?Lipid panel ordered showed cholesterol was 239, HDL 26, triglyceride 711. ? ?Troponin was 11. ? ?EKG in the emergency department showed ST depression in leads II, III and aVF with clear ST elevation in leads II. ? ?Per ED provider note, EMS EKG was concerning for ST elevation in leads V1 to V3.  EMS gave patient aspirin and nitroglycerin. ? ?Code STEMI was called and patient was taken to the Cath Lab. ? ?Left heart cath showed proximal RCA with 50% stenosis and mid RCA with 80% stenosis, patient is now status post PCI to the RCA. ? ?Patient was loaded on Brilinta and given a dose of Aggrastat. ?------------ ?At bedside patient is awake alert and oriented to self, age, location, and spouse at bedside.  He does not appear to be in acute distress and he appears very comfortable. ? ?He reports that he was getting ready to bed when he developed sternal central chest pain.  He reports he is never had this pain before.  He states that the pain was  sharp, persistent, 10 out of 10.  He denies shortness of breath, radiation to extremities, jaw or neck discomfort.  He endorses nausea and denied vomiting. ? ?He denies any dysuria, hematuria, diarrhea, abdominal pain, loss of consciousness. ? ?Social history: He lives at home with his wife.  He denies tobacco, recreational drug use.  He endorses infrequent EtOH.  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 ? ?Vaccination history: He is vaccinated for COVID-19 and the flu. ? ?ROS: ?Constitutional: no weight change, no fever ?ENT/Mouth: no sore throat, no rhinorrhea ?Eyes: no eye pain, no vision changes ?Cardiovascular: no chest pain, no dyspnea,  no edema, no palpitations ?Respiratory: no cough, no sputum, no wheezing ?Gastrointestinal: no nausea, no vomiting, no diarrhea, no constipation ?Genitourinary: no urinary incontinence, no dysuria, no hematuria ?Musculoskeletal: no arthralgias, no myalgias ?Skin: no skin lesions, no pruritus, ?Neuro: no weakness, no loss of consciousness, no syncope ?Psych: no anxiety, no depression, no decrease appetite ?Heme/Lymph: no bruising, no bleeding ? ?Assessment/Plan ? ?Principal Problem: ?  STEMI involving left anterior descending coronary artery ([place]) ?Active Problems: ?  Diabetic neuropathy associated with type 2 diabetes mellitus ([place]) ?  Hypertension associated with diabetes ([place]) ?  Hyperlipidemia associated with type 2 diabetes mellitus ([place]) ?  Type 2 diabetes mellitus with proliferative diabetic retinopathy of left eye without macular edema (H[facility]) ?  Depression, recurrent ([place]) ?  STEMI (ST elevation myocardial infarction) ([place]) ?  Poorly controlled diabetes mellitus ([place]) ?  Obesity (BMI 30-39.9) ?  Morbid obesity ([place]) ?  ?  Assessment and Plan: ? ?* STEMI involving left anterior descending coronary artery ([place]) ?- Status post left heart cath showing 50% stenosis of the proximal RCA and 80% stenosis of the mid RCA ?- Status post PCI to  RCA ?- Status post Brilinta load and Aggrastat ?- Per cardiologist, during catheterization, patient's EF was around 30%, patient received a dose of furosemide ?- Continue Farxiga 10 mg daily ?- Complete echo has been ordered for tomorrow ?- Cardiology will continue to follow ?- Admit to ICU inpatient ? ?Morbid obesity ([place]) ?- BMI greater than 35, with 1 comorbidity ? ?Obesity (BMI 30-39.9) ?- Morbid obesity ?- Recommend patient work with PCP for appropriate diet and activity as tolerated plan in order to encourage healthy weight loss ? ?Poorly controlled diabetes mellitus ([place]) ?- Diabetes outpatient educator consulted ?- Inpatient dietitian has been consulted for diabetes education ?- TOC has been consulted for medication assistance as patient endorses difficulty affording insulin ?- Patient would benefit from long-acting insulin ?- At this time he is taking 200 units of NPH and Farxiga at home ?- Ordered insulin SSI with at bedtime coverage ordered with obese dosing ?- Ordered one-time dose of Lantus 20 units on admission ?- We will continue to follow SSI dosing to accurately calculate his long-acting insulin need ?- Counseled patient on working with his PCP/endocrinologist and insurance in order to obtain long-acting insulin that is affordable to the patient ?- Goal inpatient blood glucose level is 140-180 ? ?Depression, recurrent ([place]) ?- Venlafaxine 75 mg daily with breakfast ? ?Hyperlipidemia associated with type 2 diabetes mellitus ([place]) ?- Continue atorvastatin 80 mg daily ? ?Hypertension associated with diabetes ([place]) ?- Metoprolol tartrate 12.5 mg p.o. twice daily, continue hydralazine 10 mg IV Q 20 minutes as needed for high blood pressure, for 6 hours ? ?Chart reviewed.  ? ?DVT prophylaxis: Heparin ?Code Status: Full code ?Diet: Heart healthy/carb modified ?Family Communication: Updated spouse at bedside ?Disposition Plan: Pending clinical course ?Consults called: Cardiology ?Admission status:  Inpatient, ICU/down ? ?Past Medical History:  ?Diagnosis Date  ? Arthritis   ? Depression   ? Diabetes mellitus without complication ([place])   ? Hyperlipidemia   ? Hypertension   ? ?Past Surgical History:  ?Procedure Laterality Date  ? CATARACT EXTRACTION Right   ? CATARACT EXTRACTION Left   ? TONSILLECTOMY    ? ?Social History:  reports that he has never smoked. He has never used smokeless tobacco. He reports that he does not currently use alcohol. He reports that he does not use drugs. ? ?Allergies  ?Allergen Reactions  ? Niaspan [Niacin] Other (See Comments)  ?  Red skin, red eyes  ? Trazodone Other (See Comments)  ?  nightmares  ? ?Family History  ?Problem Relation Age of Onset  ? Cancer Mother   ?     Uterine   ? Early death Mother   ? Stroke Mother   ? Diabetes Father   ? Heart attack Father   ? Heart disease Father   ? Hyperlipidemia Father   ? Diabetes Brother   ? Alzheimer's disease Maternal Grandmother   ? Kidney failure Maternal Grandfather   ? Diabetes Paternal Grandfather   ? Diabetes Brother   ? Diabetes Brother   ? Colon cancer Neg Hx   ? Pancreatic cancer Neg Hx   ? Esophageal cancer Neg Hx   ? ?Family history: Family history reviewed and not pertinent ? ?Prior to Admission medications   ?Medication Sig Start Date End Date Taking? Authorizing  Provider  ?amLODipine (NORVASC) 5 MG tablet TAKE 1 TABLET BY MOUTH DAILY. FURTHER REFILLS WILL NEED TO COME FROM NEW PRIMARY CARE PROVIDER 12/19/21   Andrew Billings, NP  ?atorvastatin (LIPITOR) 80 MG tablet Take 1 tablet (80 mg total) by mouth daily. 01/25/21   Andrew Billings, NP  ?B-D UF III MINI PEN NEEDLES 31G X 5 MM MISC USE DAILY 07/19/20   Renato Shin, MD  ?Continuous Blood Gluc Receiver (FREESTYLE LIBRE 14 DAY READER) DEVI Use to check glucose levels TID; E11.9 02/17/20   Brunetta Jeans, PA-C  ?Continuous Blood Gluc Sensor (FREESTYLE LIBRE 14 DAY SENSOR) MISC Use to check glucose TID. Change every 14 days. E 11.9 02/17/20   Brunetta Jeans, PA-C   ?dapagliflozin propanediol (FARXIGA) 10 MG TABS tablet Take 1 tablet (10 mg total) by mouth daily before breakfast. 03/28/21   Andrew Billings, NP  ?diclofenac Sodium (VOLTAREN) 1 % GEL Apply 2 g topically 4 (four) times

## 2021-12-20 ENCOUNTER — Encounter: Payer: Self-pay | Admitting: Internal Medicine

## 2021-12-20 ENCOUNTER — Inpatient Hospital Stay
Admit: 2021-12-20 | Discharge: 2021-12-20 | Disposition: A | Discharge status: Home / Self Care | Payer: Medicare Other | Attending: Internal Medicine | Admitting: Internal Medicine

## 2021-12-20 ENCOUNTER — Inpatient Hospital Stay (HOSPITAL_COMMUNITY)
Admit: 2021-12-20 | Discharge: 2021-12-20 | Disposition: A | Discharge status: Home / Self Care | Payer: Medicare Other | Attending: Internal Medicine | Admitting: Internal Medicine

## 2021-12-20 ENCOUNTER — Other Ambulatory Visit (HOSPITAL_COMMUNITY): Payer: Self-pay

## 2021-12-20 DIAGNOSIS — E1169 Type 2 diabetes mellitus with other specified complication: Secondary | ICD-10-CM

## 2021-12-20 DIAGNOSIS — F339 Major depressive disorder, recurrent, unspecified: Secondary | ICD-10-CM

## 2021-12-20 DIAGNOSIS — E785 Hyperlipidemia, unspecified: Secondary | ICD-10-CM

## 2021-12-20 DIAGNOSIS — E669 Obesity, unspecified: Secondary | ICD-10-CM | POA: Diagnosis present

## 2021-12-20 DIAGNOSIS — E11621 Type 2 diabetes mellitus with foot ulcer: Secondary | ICD-10-CM

## 2021-12-20 DIAGNOSIS — I2102 ST elevation (STEMI) myocardial infarction involving left anterior descending coronary artery: Secondary | ICD-10-CM | POA: Diagnosis not present

## 2021-12-20 DIAGNOSIS — L97522 Non-pressure chronic ulcer of other part of left foot with fat layer exposed: Secondary | ICD-10-CM

## 2021-12-20 DIAGNOSIS — E1142 Type 2 diabetes mellitus with diabetic polyneuropathy: Secondary | ICD-10-CM | POA: Diagnosis not present

## 2021-12-20 DIAGNOSIS — I152 Hypertension secondary to endocrine disorders: Secondary | ICD-10-CM

## 2021-12-20 LAB — GLUCOSE, CAPILLARY
Glucose-Capillary: 447 mg/dL — ABNORMAL HIGH (ref 70–99)
Glucose-Capillary: 389 mg/dL — ABNORMAL HIGH (ref 70–99)
Glucose-Capillary: 374 mg/dL — ABNORMAL HIGH (ref 70–99)
Glucose-Capillary: 252 mg/dL — ABNORMAL HIGH (ref 70–99)
Glucose-Capillary: 197 mg/dL — ABNORMAL HIGH (ref 70–99)

## 2021-12-20 LAB — ECHOCARDIOGRAM COMPLETE
AR max vel: 3.09 cm2
AV Area VTI: 2.81 cm2
AV Area mean vel: 3.04 cm2
AV Mean grad: 2 mmHg
AV Peak grad: 4.2 mmHg
Ao pk vel: 1.03 m/s
Area-P 1/2: 7.74 cm2
Height: 72 in
MV VTI: 3.19 cm2
S' Lateral: 2.72 cm
Weight: 4546.77 oz

## 2021-12-20 LAB — CBC
HCT: 47.5 % (ref 39.0–52.0)
Hemoglobin: 16.3 g/dL (ref 13.0–17.0)
MCH: 30.1 pg (ref 26.0–34.0)
MCHC: 34.3 g/dL (ref 30.0–36.0)
MCV: 87.8 fL (ref 80.0–100.0)
Platelets: 318 10*3/uL (ref 150–400)
RBC: 5.41 MIL/uL (ref 4.22–5.81)
RDW: 13.2 % (ref 11.5–15.5)
WBC: 10.6 10*3/uL — ABNORMAL HIGH (ref 4.0–10.5)
nRBC: 0 % (ref 0.0–0.2)

## 2021-12-20 LAB — BASIC METABOLIC PANEL
Anion gap: 12 (ref 5–15)
BUN: 31 mg/dL — ABNORMAL HIGH (ref 8–23)
CO2: 25 mmol/L (ref 22–32)
Calcium: 8.8 mg/dL — ABNORMAL LOW (ref 8.9–10.3)
Chloride: 99 mmol/L (ref 98–111)
Creatinine, Ser: 1.92 mg/dL — ABNORMAL HIGH (ref 0.61–1.24)
GFR, Estimated: 38 mL/min — ABNORMAL LOW (ref >60)
Glucose, Bld: 450 mg/dL — ABNORMAL HIGH (ref 70–99)
Potassium: 3.8 mmol/L (ref 3.5–5.1)
Sodium: 136 mmol/L (ref 135–145)

## 2021-12-20 LAB — HEMOGLOBIN A1C
Hgb A1c MFr Bld: 11 % — ABNORMAL HIGH (ref 4.8–5.6)
Mean Plasma Glucose: 269 mg/dL

## 2021-12-20 LAB — TROPONIN I (HIGH SENSITIVITY): Troponin I (High Sensitivity): 9806 ng/L (ref <18)

## 2021-12-20 LAB — MRSA NEXT GEN BY PCR, NASAL: MRSA by PCR Next Gen: NOT DETECTED

## 2021-12-20 LAB — LDL CHOLESTEROL, DIRECT: Direct LDL: 97.3 mg/dL (ref 0–99)

## 2021-12-20 LAB — HIV ANTIBODY (ROUTINE TESTING W REFLEX): HIV Screen 4th Generation wRfx: NONREACTIVE

## 2021-12-20 MED ORDER — PERFLUTREN LIPID MICROSPHERE
1.0000 mL | INTRAVENOUS | Status: AC | PRN
  Administered 2021-12-20: 4 mL via INTRAVENOUS
  Filled 2021-12-20: qty 10

## 2021-12-20 MED ORDER — INSULIN DETEMIR 100 UNIT/ML ~~LOC~~ SOLN
15.0000 [IU] | Freq: Two times a day (BID) | SUBCUTANEOUS | Status: DC
  Administered 2021-12-20 – 2021-12-21 (×2): 15 [IU] via SUBCUTANEOUS
  Filled 2021-12-20 (×3): qty 0.15

## 2021-12-20 MED ORDER — MELATONIN 5 MG PO TABS: 5.0000 mg | ORAL_TABLET | Freq: Every evening | ORAL | Status: DC | PRN

## 2021-12-20 MED ORDER — CARVEDILOL 3.125 MG PO TABS
3.1250 mg | ORAL_TABLET | Freq: Two times a day (BID) | ORAL | Status: DC
Start: 2021-12-20 — End: 2021-12-21
  Administered 2021-12-20 – 2021-12-21 (×3): 3.125 mg via ORAL
  Filled 2021-12-20 (×3): qty 1

## 2021-12-20 MED ORDER — ENSURE MAX PROTEIN PO LIQD
11.0000 [oz_av] | Freq: Two times a day (BID) | ORAL | Status: DC
  Administered 2021-12-20 (×2): 11 [oz_av] via ORAL
  Filled 2021-12-20: qty 330

## 2021-12-20 MED ORDER — ADULT MULTIVITAMIN W/MINERALS CH
1.0000 | ORAL_TABLET | Freq: Every day | ORAL | Status: DC
  Administered 2021-12-21: 1 via ORAL
  Filled 2021-12-20: qty 1

## 2021-12-20 MED ORDER — MORPHINE SULFATE (PF) 2 MG/ML IV SOLN: 2.0000 mg | INTRAVENOUS | Status: DC | PRN

## 2021-12-20 MED ORDER — INSULIN ASPART 100 UNIT/ML IJ SOLN
4.0000 [IU] | Freq: Three times a day (TID) | INTRAMUSCULAR | Status: DC
  Administered 2021-12-20 – 2021-12-21 (×4): 4 [IU] via SUBCUTANEOUS
  Filled 2021-12-20 (×4): qty 1

## 2021-12-20 MED ORDER — ASCORBIC ACID 500 MG PO TABS
250.0000 mg | ORAL_TABLET | Freq: Two times a day (BID) | ORAL | Status: DC
  Administered 2021-12-20 – 2021-12-21 (×2): 250 mg via ORAL
  Filled 2021-12-20 (×2): qty 1

## 2021-12-20 NOTE — TOC Benefit Eligibility Note (Signed)
Patient Advocate Encounter ? ?Insurance verification completed.   ? ?The patient is currently admitted and upon discharge could be taking Brilinta 90 mg. ? ?The current 30 day co-pay is, $402.80 due to a $355.80 deductible remaining.  Will be $47.00 once deductible is met.  ? ?The patient is insured through [place] Medicare Part D  ? ? ? ?Lyndel Safe, CPhT ?Pharmacy Patient Advocate Specialist ?[place] Patient Advocate Team ?Direct Number: [phone]  Fax: [phone] ? ? ? ? ? ?  ?

## 2021-12-20 NOTE — Consult Note (Signed)
WOC Nurse Consult Note: ?Reason for Consult: Left foot, interdigital and plantar aspect, full thickness. Patient was seen yesterday afternoon (12/19/21) by Podiatric medicine (Dr. Keith Rake). ?Wound type: Neuropathic ?Pressure Injury POA: N/A ? ?I sent Dr. Posey Pronto a Secure Chat message today inquiring of his preference for management of the wound (to be consulted by the Admitting Provider and see in house, provide orders based on visit yesterday, or have [facility] Solis provide in-house guidance).  The note from yesterday's encounter is not yet in the record.  I will stand by for his reply. ? ?Conservative orders for Nursing are placed in the interim for NS dampened gauze dressing applied twice daily topped with dry gauze and secured with conform bandaging.  Floatation of heels.  ? ? ?Thanks, ?Maudie Flakes, MSN, RN, [place], [place], CWON-AP, [place]  ?Pager# [phone]  ? ? ? ?  ?

## 2021-12-20 NOTE — Consult Note (Signed)
WOC Nurse Consult Note: ?Dr. Posey Pronto responded to my Secure Chat correspondence and he prefers to be consulted by Attending for in-house oversight of the patient's left foot wound.  I will communicate this to Dr. Manuella Ghazi. ? ?[place] nursing team will not follow, but will remain available to this patient, the nursing and medical teams.  Please re-consult if needed. ?Thanks, ?Maudie Flakes, MSN, RN, [place], [place], CWON-AP, [place]  ?Pager# [phone]  ? ? ?  ?

## 2021-12-20 NOTE — Progress Notes (Signed)
?  Chaplain On-Call responded to [place] Order from [facility] at 0631 hours. ? ?Chaplain found the patient to be sleeping soundly. ? ?Will refer to Afternoon Chaplain for follow-up. ? ?Chaplain Pollyann Samples ?M.Div., [facility] ?

## 2021-12-20 NOTE — Progress Notes (Signed)
*  PRELIMINARY RESULTS* ?Echocardiogram ?2D Echocardiogram has been performed. ? ?Andrew Pruitt ?12/20/2021, 2:28 PM ?

## 2021-12-20 NOTE — Assessment & Plan Note (Addendum)
-   Status post left heart cath showing 50% stenosis of the proximal RCA and 80% stenosis of the mid RCA ?- Status post PCI to RCA ?- Status post Brilinta load and Aggrastat ?- continue Brilanta, b-blocker, asa, statin ?- Per cardiologist, during catheterization, patient's EF was around 30%, patient received a dose of furosemide ?- Continue Farxiga 10 mg daily ?- Complete echo is pending ?- Cardiology following.  ?- transfer out to PCU today ?

## 2021-12-20 NOTE — Plan of Care (Signed)
Pt admitted to ICU ~2230 for STEMI post LHC. Pt denies chest pain, SOB, other pain. PRN hydralazine givenx1 for SBP>170 to good effect; SBP 120s. NSR with residual ST elevation noted on tele and post procedure EKG; MD aware. R radial site CDI without hematoma. TR band weaned off ~0400. Aggrastat gtt turned off per orders '@0140'$ . BG >450. MD notified. Orders received to treat with 5u insulin aspart and 20 units insulin glargine. L foot wound noted; per pt treated at podiatry clinic 4/11.   ?Problem: Education: ?Goal: Knowledge of General Education information will improve ?Description: Including pain rating scale, medication(s)/side effects and non-pharmacologic comfort measures ?Outcome: Progressing ?  ?Problem: Health Behavior/Discharge Planning: ?Goal: Ability to manage health-related needs will improve ?Outcome: Progressing ?  ?Problem: Clinical Measurements: ?Goal: Ability to maintain clinical measurements within normal limits will improve ?Outcome: Progressing ?Goal: Will remain free from infection ?Outcome: Progressing ?Goal: Diagnostic test results will improve ?Outcome: Progressing ?Goal: Respiratory complications will improve ?Outcome: Progressing ?Goal: Cardiovascular complication will be avoided ?Outcome: Progressing ?  ?Problem: Activity: ?Goal: Risk for activity intolerance will decrease ?Outcome: Progressing ?  ?Problem: Nutrition: ?Goal: Adequate nutrition will be maintained ?Outcome: Progressing ?  ?Problem: Coping: ?Goal: Level of anxiety will decrease ?Outcome: Progressing ?  ?Problem: Elimination: ?Goal: Will not experience complications related to bowel motility ?Outcome: Progressing ?Goal: Will not experience complications related to urinary retention ?Outcome: Progressing ?  ?Problem: Pain Managment: ?Goal: General experience of comfort will improve ?Outcome: Progressing ?  ?Problem: Safety: ?Goal: Ability to remain free from injury will improve ?Outcome: Progressing ?  ?Problem: Skin  Integrity: ?Goal: Risk for impaired skin integrity will decrease ?Outcome: Progressing ?  ?Problem: Education: ?Goal: Understanding of CV disease, CV risk reduction, and recovery process will improve ?Outcome: Progressing ?Goal: Individualized Educational Video(s) ?Outcome: Progressing ?  ?Problem: Activity: ?Goal: Ability to return to baseline activity level will improve ?Outcome: Progressing ?  ?Problem: Cardiovascular: ?Goal: Ability to achieve and maintain adequate cardiovascular perfusion will improve ?Outcome: Progressing ?Goal: Vascular access site(s) Level 0-1 will be maintained ?Outcome: Progressing ?  ?Problem: Health Behavior/Discharge Planning: ?Goal: Ability to safely manage health-related needs after discharge will improve ?Outcome: Progressing ?  ?

## 2021-12-20 NOTE — Consult Note (Signed)
? ?Reason for Consult: Ulcer of left foot ?Referring Physician: Dr. Manuella Ghazi ? ?Andrew Pruitt is an 66 y.o. male.  ?HPI: He was seen by Dr. Posey Pronto my partner yesterday in the clinic.  He was admitted last night for STEMI.  We are consulted for wound care recommendations ? ?Past Medical History:  ?Diagnosis Date  ? Arthritis   ? Depression   ? Diabetes mellitus without complication ([place])   ? Hyperlipidemia   ? Hypertension   ? ? ?Past Surgical History:  ?Procedure Laterality Date  ? CATARACT EXTRACTION Right   ? CATARACT EXTRACTION Left   ? CORONARY/GRAFT ACUTE MI REVASCULARIZATION N/A 12/19/2021  ? Procedure: Coronary/Graft Acute MI Revascularization;  Surgeon: Nelva Bush, MD;  Location: [facility];  Service: Cardiovascular;  Laterality: N/A;  ? LEFT HEART CATH AND CORONARY ANGIOGRAPHY N/A 12/19/2021  ? Procedure: LEFT HEART CATH AND CORONARY ANGIOGRAPHY;  Surgeon: Nelva Bush, MD;  Location: [facility];  Service: Cardiovascular;  Laterality: N/A;  ? TONSILLECTOMY    ? ? ?Family History  ?Problem Relation Age of Onset  ? Cancer Mother   ?     Uterine   ? Early death Mother   ? Stroke Mother   ? Diabetes Father   ? Heart attack Father   ? Heart disease Father   ? Hyperlipidemia Father   ? Diabetes Brother   ? Alzheimer's disease Maternal Grandmother   ? Kidney failure Maternal Grandfather   ? Diabetes Paternal Grandfather   ? Diabetes Brother   ? Diabetes Brother   ? Colon cancer Neg Hx   ? Pancreatic cancer Neg Hx   ? Esophageal cancer Neg Hx   ? ? ?Social History:  reports that he has never smoked. He has never used smokeless tobacco. He reports that he does not currently use alcohol. He reports that he does not use drugs. ? ?Allergies:  ?Allergies  ?Allergen Reactions  ? Niaspan [Niacin] Other (See Comments)  ?  Red skin, red eyes  ? Trazodone Other (See Comments)  ?  nightmares  ? ? ?Medications: I have reviewed the patient's current medications. ? ?Results for orders placed or performed  during the hospital encounter of 12/19/21 (from the past 48 hour(s))  ?CBC with Differential/Platelet     Status: None  ? Collection Time: 12/19/21  8:54 PM  ?Result Value Ref Range  ? WBC 7.3 4.0 - 10.5 K/uL  ? RBC 5.15 4.22 - 5.81 MIL/uL  ? Hemoglobin 15.6 13.0 - 17.0 g/dL  ? HCT 45.3 39.0 - 52.0 %  ? MCV 88.0 80.0 - 100.0 fL  ? MCH 30.3 26.0 - 34.0 pg  ? MCHC 34.4 30.0 - 36.0 g/dL  ? RDW 13.2 11.5 - 15.5 %  ? Platelets 306 150 - 400 K/uL  ? nRBC 0.0 0.0 - 0.2 %  ? Neutrophils Relative % 54 %  ? Neutro Abs 3.9 1.7 - 7.7 K/uL  ? Lymphocytes Relative 34 %  ? Lymphs Abs 2.5 0.7 - 4.0 K/uL  ? Monocytes Relative 10 %  ? Monocytes Absolute 0.7 0.1 - 1.0 K/uL  ? Eosinophils Relative 1 %  ? Eosinophils Absolute 0.1 0.0 - 0.5 K/uL  ? Basophils Relative 1 %  ? Basophils Absolute 0.0 0.0 - 0.1 K/uL  ? Immature Granulocytes 0 %  ? Abs Immature Granulocytes 0.02 0.00 - 0.07 K/uL  ?  Comment: Performed at [facility], [address], [place], [place] [address]  ?Protime-INR  Status: None  ? Collection Time: 12/19/21  8:54 PM  ?Result Value Ref Range  ? Prothrombin Time 11.9 11.4 - 15.2 seconds  ? INR 0.9 0.8 - 1.2  ?  Comment: (NOTE) ?INR goal varies based on device and disease states. ?Performed at [facility], [place], ?[place] [address] ?  ?APTT     Status: None  ? Collection Time: 12/19/21  8:54 PM  ?Result Value Ref Range  ? aPTT 24 24 - 36 seconds  ?  Comment: Performed at [facility], [address], [place], [place] [address]  ?Comprehensive metabolic panel     Status: Abnormal  ? Collection Time: 12/19/21  8:54 PM  ?Result Value Ref Range  ? Sodium 134 (L) 135 - 145 mmol/L  ? Potassium 4.0 3.5 - 5.1 mmol/L  ? Chloride 97 (L) 98 - 111 mmol/L  ? CO2 26 22 - 32 mmol/L  ? Glucose, Bld 472 (H) 70 - 99 mg/dL  ?  Comment: Glucose reference range applies only to samples taken after fasting for at least 8 hours.  ? BUN 27 (H) 8 - 23 mg/dL  ? Creatinine, Ser 2.09 (H) 0.61 -  1.24 mg/dL  ? Calcium 9.0 8.9 - 10.3 mg/dL  ? Total Protein 7.2 6.5 - 8.1 g/dL  ? Albumin 3.6 3.5 - 5.0 g/dL  ? AST 28 15 - 41 U/L  ? ALT 53 (H) 0 - 44 U/L  ? Alkaline Phosphatase 52 38 - 126 U/L  ? Total Bilirubin 0.7 0.3 - 1.2 mg/dL  ? GFR, Estimated 34 (L) >60 mL/min  ?  Comment: (NOTE) ?Calculated using the CKD-EPI Creatinine Equation (2021) ?  ? Anion gap 11 5 - 15  ?  Comment: Performed at [facility], [address], [place],  [address]  ?Troponin I (High Sensitivity)     Status: None  ? Collection Time: 12/19/21  8:54 PM  ?Result Value Ref Range  ? Troponin I (High Sensitivity) 11 <18 ng/L  ?  Comment: (NOTE) ?Elevated high sensitivity troponin I (hsTnI) values and significant  ?changes across serial measurements may suggest ACS but many other  ?chronic and acute conditions are known to elevate hsTnI results.  ?Refer to the Links section for chest pain algorithms and additional  ?guidance. ?Performed at [facility], [place], ?[place] [address] ?  ?Lipid panel     Status: Abnormal  ? Collection Time: 12/19/21  8:54 PM  ?Result Value Ref Range  ? Cholesterol 239 (H) 0 - 200 mg/dL  ? Triglycerides 711 (H) <150 mg/dL  ? HDL 26 (L) >40 mg/dL  ? Total CHOL/HDL Ratio 9.2 RATIO  ? VLDL UNABLE TO CALCULATE IF TRIGLYCERIDE OVER 400 mg/dL 0 - 40 mg/dL  ? LDL Cholesterol UNABLE TO CALCULATE IF TRIGLYCERIDE OVER 400 mg/dL 0 - 99 mg/dL  ?  Comment:        ?Total Cholesterol/HDL:CHD Risk ?Coronary Heart Disease Risk Table ?                    Men   Women ? 1/2 Average Risk   3.4   3.3 ? Average Risk       5.0   4.4 ? 2 X Average Risk   9.6   7.1 ? 3 X Average Risk  23.4   11.0 ?       ?Use the calculated Patient Ratio ?above and the CHD Risk Table ?to determine  the patient's CHD Risk. ?       ?ATP III CLASSIFICATION (LDL): ? <100     mg/dL   Optimal ? 100-129  mg/dL   Near or Above ?                   Optimal ? 130-159  mg/dL   Borderline ? 160-189  mg/dL   High ? >190     mg/dL    Very High ?Performed at [facility], [address], [place], [place] [address] ?  ?LDL cholesterol, direct     Status: None  ? Collection Time: 12/19/21  8:54 PM  ?Result Value Ref Range  ? Direct LDL 97.3 0 - 99 mg/dL  ?  Comment: Performed at [facility], [place] [address] [place], [place] [address]  ?Resp Panel by RT-PCR (Flu A&B, Covid) Nasopharyngeal Swab     Status: None  ? Collection Time: 12/19/21  9:02 PM  ? Specimen: Nasopharyngeal Swab; Nasopharyngeal(NP) swabs in vial transport medium  ?Result Value Ref Range  ? SARS Coronavirus 2 by RT PCR NEGATIVE NEGATIVE  ?  Comment: (NOTE) ?SARS-CoV-2 target nucleic acids are NOT DETECTED. ? ?The SARS-CoV-2 RNA is generally detectable in upper respiratory ?specimens during the acute phase of infection. The lowest ?concentration of SARS-CoV-2 viral copies this assay can detect is ?138 copies/mL. A negative result does not preclude SARS-Cov-2 ?infection and should not be used as the sole basis for treatment or ?other patient management decisions. A negative result may occur with  ?improper specimen collection/handling, submission of specimen other ?than nasopharyngeal swab, presence of viral mutation(s) within the ?areas targeted by this assay, and inadequate number of viral ?copies(<138 copies/mL). A negative result must be combined with ?clinical observations, patient history, and epidemiological ?information. The expected result is Negative. ? ?Fact Sheet for Patients:  ?EntrepreneurPulse.com.au ? ?Fact Sheet for Healthcare Providers:  ?IncredibleEmployment.be ? ?This test is no t yet approved or cleared by the [place] FDA and  ?has been authorized for detection and/or diagnosis of SARS-CoV-2 by ?FDA under an Emergency Use Authorization (EUA). This EUA will remain  ?in effect (meaning this test can be used) for the duration of the ?COVID-19 declaration under Section 564(b)(1) of the Act, 21 ?U.S.C.section  360bbb-3(b)(1), unless the authorization is terminated  ?or revoked sooner.  ? ? ?  ? Influenza A by PCR NEGATIVE NEGATIVE  ? Influenza B by PCR NEGATIVE NEGATIVE  ?  Comment: (NOTE) ?The Xpert Xpress

## 2021-12-20 NOTE — TOC Initial Note (Signed)
Transition of Care (TOC) - Initial/Assessment Note  ? ? ?Patient Details  ?Name: Andrew Pruitt ?MRN: [id number] ?Date of Birth: 26-Dec-1955 ? ?Transition of Care (TOC) CM/SW Contact:    ?Shelbie Hutching, RN ?Phone Number: ?12/20/2021, 3:52 PM ? ?Clinical Narrative:                 ?TOC consult for medication assistance.  RNCM met with patient at the bedside, introduced self and described role in DC planning. ?Patient is from home with his wife and adult daughter, he is independent. ?The patient reports that they will be moving to [place] next week, they already have the trucks and trailers ready.  Patient is aware that he will need to establish care in [place] when he arrives. ?He is getting his Wilder Glade for free through assistance with [employer].  He is getting his insulin at [facility] for $85/IOEV.  The Trulicity he is not taking he says his insurance does not cover it.  He will need to look into his MD prescribing something different.    ? ?TOC will follow through discharge.   ? ?Expected Discharge Plan: Home/Self Care ?Barriers to Discharge: Continued Medical Work up ? ? ?Patient Goals and CMS Choice ?  ?  ?  ? ?Expected Discharge Plan and Services ?Expected Discharge Plan: Home/Self Care ?  ?Discharge Planning Services: CM Consult ?  ?Living arrangements for the past 2 months: [place] ?                ?DME Arranged: N/A ?DME Agency: NA ?  ?  ?  ?HH Arranged: NA ?[place] Agency: NA ?  ?  ?  ? ?Prior Living Arrangements/Services ?Living arrangements for the past 2 months: [place] ?Lives with:: Spouse, Adult Children ?Patient language and need for interpreter reviewed:: Yes ?Do you feel safe going back to the place where you live?: Yes      ?Need for Family Participation in Patient Care: Yes (Comment) ?Care giver support system in place?: Yes (comment) (wife and daughter) ?  ?Criminal Activity/Legal Involvement Pertinent to Current Situation/Hospitalization: No - Comment as needed ? ?Activities of  Daily Living ?Home Assistive Devices/Equipment: None ?ADL Screening (condition at time of admission) ?Patient's cognitive ability adequate to safely complete daily activities?: Yes ?Is the patient deaf or have difficulty hearing?: No ?Does the patient have difficulty seeing, even when wearing glasses/contacts?: No ?Does the patient have difficulty concentrating, remembering, or making decisions?: No ?Patient able to express need for assistance with ADLs?: No ?Does the patient have difficulty dressing or bathing?: No ?Independently performs ADLs?: Yes (appropriate for developmental age) ?Does the patient have difficulty walking or climbing stairs?: No ?Weakness of Legs: None ?Weakness of Arms/Hands: None ? ?Permission Sought/Granted ?Permission sought to share information with : Case Manager, Family Supports ?Permission granted to share information with : Yes, Verbal Permission Granted ? Share Information with NAME: Andrew Pruitt ?   ? Permission granted to share info w Relationship: spouse ? Permission granted to share info w Contact Information: [phone] ? ?Emotional Assessment ?Appearance:: Appears stated age ?Attitude/Demeanor/Rapport: Engaged ?Affect (typically observed): Accepting ?Orientation: : Oriented to Self, Oriented to Place, Oriented to  Time, Oriented to Situation ?Alcohol / Substance Use: Not Applicable ?Psych Involvement: No (comment) ? ?Admission diagnosis:  ST elevation myocardial infarction (STEMI), unspecified artery ([place]) [I21.3] ?STEMI involving left anterior descending coronary artery ([place]) [I21.02] ?Patient Active Problem List  ? Diagnosis Date Noted  ? Obesity (BMI 30-39.9) 12/20/2021  ? Morbid  obesity ([place]) 12/20/2021  ? STEMI (ST elevation myocardial infarction) ([place]) 12/19/2021  ? STEMI involving left anterior descending coronary artery ([place]) 12/19/2021  ? Poorly controlled diabetes mellitus ([place]) 12/19/2021  ? Diabetic ulcer of toe of left foot associated with type 2 diabetes mellitus  ([place]) 12/18/2021  ? Depression, recurrent ([place])   ? Nuclear sclerotic cataract of both eyes 09/07/2020  ? Horseshoe retinal tear, right eye 08/11/2020  ? Viral warts 06/07/2020  ? Diabetic macular edema of right eye with proliferative retinopathy associated with type 2 diabetes mellitus ([place]) 04/20/2020  ? Proliferative diabetic retinopathy of right eye ([place]) 03/02/2020  ? Type 2 diabetes mellitus with proliferative diabetic retinopathy of left eye without macular edema ([place]) 03/02/2020  ? Diabetic neuropathy associated with type 2 diabetes mellitus ([place]) 02/22/2020  ? Type 2 diabetes mellitus ([place]) 02/22/2020  ? Hypertension associated with diabetes ([place]) 02/22/2020  ? Hyperlipidemia associated with type 2 diabetes mellitus ([place]) 02/22/2020  ? Prostate cancer screening 02/22/2020  ? Plantar warts 02/22/2020  ? ?PCP:  Jon Billings, NP ?Pharmacy:   ?[id number] [place]?Phone: [phone]: [phone]? ?[id number] [place] [place] [address] ?[address] [place]: [phone]: [phone] ? ? ? ?Social Determinants of Health (SDOH) Interventions ?  ? ?Readmission Risk Interventions ?   ? View : No data to display.  ?  ?  ?  ? ? ? ?

## 2021-12-20 NOTE — Plan of Care (Signed)
Nutrition Education Note  ? ?RD consulted for nutrition education regarding diabetes.  ? ?66 year old with history of obesity, hypertension, hyperlipidemia, insulin-dependent diabetes mellitus, CKD 3B and depression who is admitted with STEMI.  ? ?Lab Results  ?Component Value Date  ? HGBA1C 11.0 (H) 12/20/2021  ? ? ?RD provided "Nutrition and Type II Diabetes" handout from the Academy of Nutrition and Dietetics. Discussed different food groups and their effects on blood sugar, emphasizing carbohydrate-containing foods. Provided list of carbohydrates and recommended serving sizes of common foods. ? ?Discussed importance of controlled and consistent carbohydrate intake throughout the day. Provided examples of ways to balance meals/snacks and encouraged intake of high-fiber, whole grain complex carbohydrates. Teach back method used. ? ?Expect good compliance. ? ?Body mass index is 38.54 kg/m?Marland Kitchen Pt meets criteria for obesity based on current BMI. ? ?Current diet order is HH/CHO modifed, patient is consuming approximately 75% of meals at this time. Labs and medications reviewed.  ? ?RD will add Ensure Max protein supplement BID, each supplement provides 150kcal and 30g of protein. ? ?RD will add vitamin C and MVI to help support wound healing.  ? ?No further nutrition interventions warranted at this time. RD contact information provided. If additional nutrition issues arise, please re-consult RD. ? ?Koleen Distance MS, RD, LDN ?Please refer to AMION for RD and/or RD on-call/weekend/after hours pager ? ?

## 2021-12-20 NOTE — Progress Notes (Signed)
?Progress Note ? ? ?Patient: Andrew Pruitt BZJ:[id number] DOB: 28-May-1956 DOA: 12/19/2021     1 ?DOS: the patient was seen and examined on 12/20/2021 ?  ?Brief hospital course: ?Mr. Caliber Landess is a 66 year old with history of obesity, BMI of 38, hypertension, hyperlipidemia, insulin-dependent diabetes mellitus, poorly controlled, difficulty affording insulin, CKD 3B at baseline, who presents to the emergency department for chief concerns of chest pain. ? ?He was found to have STEMI. ? ?Initial vitals in the emergency department showed temperature of 98.4, respiration rate of 16, heart rate 71, blood pressure 137/88, SPO2 of 97% on room air. ? ?Serum sodium 134, potassium 4.0, chloride 97, bicarb 26, BUN 27, serum creatinine of 2.09, GFR 34, nonfasting blood glucose 472. ? ?WBC was 7.3, hemoglobin 15.6, platelets of 306. ? ?Lipid panel ordered showed cholesterol was 239, HDL 26, triglyceride 711. ? ?Troponin was 11. ? ?EKG in the emergency department showed ST depression in leads II, III and aVF with clear ST elevation in leads II. ? ?Per ED provider note, EMS EKG was concerning for ST elevation in leads V1 to V3.  EMS gave patient aspirin and nitroglycerin. ? ?Code STEMI was called and patient was taken to the Cath Lab. ? ?Left heart cath showed proximal RCA with 50% stenosis and mid RCA with 80% stenosis, patient is now status post PCI to the RCA. ? ?Patient was loaded on Brilinta and given a dose of Aggrastat. ? ?4/12: transfer to PCU, Podiatry c/s for LE wound ? ? ?Assessment and Plan: ?* STEMI involving left anterior descending coronary artery ([place]) ?- Status post left heart cath showing 50% stenosis of the proximal RCA and 80% stenosis of the mid RCA ?- Status post PCI to RCA ?- Status post Brilinta load and Aggrastat ?- continue Brilanta, b-blocker, asa, statin ?- Per cardiologist, during catheterization, patient's EF was around 30%, patient received a dose of furosemide ?- Continue Farxiga 10 mg daily ?-  Complete echo is pending ?- Cardiology following.  ?- transfer out to PCU today ? ?Morbid obesity ([place]) ?- BMI greater than 35, with 1 comorbidity/DM ? ?Obesity (BMI 30-39.9) ?- Morbid obesity ?- Recommend patient work with PCP for appropriate diet and activity as tolerated plan in order to encourage healthy weight loss ? ?Poorly controlled diabetes mellitus ([place]) ?- TOC has been consulted for medication assistance as patient endorses difficulty affording insulin ?- Increasing Levemir and adding Novolog per DM nurse recommendation ?- continue insulin SSI with at bedtime coverage ordered with obese dosing ?- Counseled patient on working with his PCP/endocrinologist and insurance in order to obtain long-acting insulin that is affordable to the patient ?- Goal inpatient blood glucose level is 140-180 ? ?Depression, recurrent ([place]) ?- Venlafaxine 75 mg daily with breakfast. ? ?Hyperlipidemia associated with type 2 diabetes mellitus ([place]) ?- Continue atorvastatin 80 mg daily. ? ?Hypertension associated with diabetes ([place]) ?- Metoprolol switched to Coreg by cardio for better BP control ? ?Diabetic neuropathy associated with type 2 diabetes mellitus ([place]) ?Not symptomatic - not on meds ? ? ? ? ?  ? ?Subjective: no new symptoms. Thankful for his care ? ?Physical Exam: ?Vitals:  ? 12/20/21 1000 12/20/21 1100 12/20/21 1235 12/20/21 1511  ?BP: (!) 149/81 (!) 141/86 (!) 151/78 133/79  ?Pulse: 86 86  96  ?Resp: (!) '23 15 18   '$ ?Temp:    98.8 ?F (37.1 ?C)  ?TempSrc:      ?SpO2: 98% 97% 97% 97%  ?Weight:      ?Height:      ? ?  Constitutional: appears age-appropriate, NAD, calm, comfortable ?Eyes: PERRL, lids and conjunctivae normal ?ENMT: Mucous membranes are moist. Posterior pharynx clear of any exudate or lesions. Age-appropriate dentition. Hearing appropriate ?Neck: normal, supple, no masses, no thyromegaly ?Respiratory: clear to auscultation bilaterally, no wheezing, no crackles. Normal respiratory effort. No accessory muscle  use.  ?Cardiovascular: Regular rate and rhythm, no murmurs / rubs / gallops. No extremity edema. 2+ pedal pulses. No carotid bruits.  ?Abdomen: Morbidly obese abdomen, no tenderness, no masses palpated, no hepatosplenomegaly. Bowel sounds positive.  ?Musculoskeletal: no clubbing / cyanosis. No joint deformity upper and lower extremities. Good ROM, no contractures, no atrophy. Normal muscle tone.  ?Skin: no rashes, lesions, ulcers. No induration ?Neurologic: Sensation intact. Strength 5/5 in all 4.  ?Psychiatric: Normal judgment and insight. Alert and oriented x 3. Normal mood.  ? ?Data Reviewed: ? ?elevated sugars, creatinine & A1c ? ?Family Communication: none ? ?Disposition: ?Status is: Inpatient ?Remains inpatient appropriate because: Up titrating cardiac meds per cardio, pending echo ? ? Planned Discharge Destination: Home ? ? ?DVT prophylaxis - heparin SQ ?Time spent: 35 minutes ? ?Author: ?Max Sane, MD ?12/20/2021 4:10 PM ? ?For on call review [email or web address].  ?

## 2021-12-20 NOTE — Assessment & Plan Note (Signed)
Not symptomatic - not on meds ?

## 2021-12-20 NOTE — Progress Notes (Signed)
? ?Cardiology Progress Note  ? ?Patient Name: Andrew Pruitt ?Date of Encounter: 12/20/2021 ? ?Primary Cardiologist: Nelva Bush, MD ? ?Subjective  ? ?Overall feels well this morning but notes very mild 1-2/10 chest discomfort this been persistent since his event and procedure last night.  Denies dyspnea. ? ?Inpatient Medications  ?  ?Scheduled Meds: ? vitamin C  250 mg Oral BID  ? aspirin  81 mg Oral Daily  ? atorvastatin  80 mg Oral Daily  ? carvedilol  3.125 mg Oral BID WC  ? Chlorhexidine Gluconate Cloth  6 each Topical Q0600  ? dapagliflozin propanediol  10 mg Oral QAC breakfast  ? fenofibrate  160 mg Oral Daily  ? gabapentin  100 mg Oral TID  ? heparin  5,000 Units Subcutaneous Q8H  ? insulin aspart  0-20 Units Subcutaneous TID WC  ? insulin aspart  0-5 Units Subcutaneous QHS  ? insulin aspart  4 Units Subcutaneous TID WC  ? insulin detemir  15 Units Subcutaneous BID  ? [START ON 12/21/2021] multivitamin with minerals  1 tablet Oral Daily  ? Ensure Max Protein  11 oz Oral BID  ? sodium chloride flush  3 mL Intravenous Q12H  ? ticagrelor  90 mg Oral BID  ? venlafaxine XR  75 mg Oral Q breakfast  ? ?Continuous Infusions: ? sodium chloride    ? ?PRN Meds: ?sodium chloride, acetaminophen, hydrALAZINE, melatonin, nitroGLYCERIN, ondansetron (ZOFRAN) IV, polyethylene glycol, sodium chloride flush  ? ?Vital Signs  ?  ?Vitals:  ? 12/20/21 0900 12/20/21 1000 12/20/21 1100 12/20/21 1235  ?BP: (!) 145/81 (!) 149/81 (!) 141/86 (!) 151/78  ?Pulse: 82 86 86   ?Resp: 17 (!) '23 15 18  '$ ?Temp:      ?TempSrc:      ?SpO2: 98% 98% 97%   ?Weight:      ?Height:      ? ? ?Intake/Output Summary (Last 24 hours) at 12/20/2021 1405 ?Last data filed at 12/20/2021 1100 ?Gross per 24 hour  ?Intake 1080 ml  ?Output 4701 ml  ?Net -3621 ml  ? ?Filed Weights  ? 12/19/21 2058 12/19/21 2230  ?Weight: (!) 139.5 kg 128.9 kg  ? ? ?Physical Exam  ? ?GEN: Well nourished, well developed, in no acute distress.  ?HEENT: Grossly normal.  ?Neck: Supple, no  JVD, carotid bruits, or masses. ?Cardiac: RRR, no murmurs, rubs, or gallops. No clubbing, cyanosis, edema.  Radials 2+, DP/PT 2+ and equal bilaterally. R radial cath site w/o bleeding/bruit/hematoma. ?Respiratory:  Respirations regular and unlabored, clear to auscultation bilaterally. ?GI: Soft, nontender, nondistended, BS + x 4. ?MS: no deformity or atrophy. ?Skin: warm and dry, no rash. ?Neuro:  Strength and sensation are intact. ?Psych: AAOx3.  Normal affect. ? ?Labs  ?  ?Chemistry ?Recent Labs  ?Lab 12/19/21 ?2054 12/20/21 ?0207  ?NA 134* 136  ?K 4.0 3.8  ?CL 97* 99  ?CO2 26 25  ?GLUCOSE 472* 450*  ?BUN 27* 31*  ?CREATININE 2.09* 1.92*  ?CALCIUM 9.0 8.8*  ?PROT 7.2  --   ?ALBUMIN 3.6  --   ?AST 28  --   ?ALT 53*  --   ?ALKPHOS 52  --   ?BILITOT 0.7  --   ?GFRNONAA 34* 38*  ?ANIONGAP 11 12  ?  ? ?Hematology ?Recent Labs  ?Lab 12/19/21 ?2054 12/20/21 ?0207  ?WBC 7.3 10.6*  ?RBC 5.15 5.41  ?HGB 15.6 16.3  ?HCT 45.3 47.5  ?MCV 88.0 87.8  ?MCH 30.3 30.1  ?MCHC 34.4 34.3  ?RDW  13.2 13.2  ?PLT 306 318  ? ? ?Cardiac Enzymes  ?Recent Labs  ?Lab 12/19/21 ?2054 12/19/21 ?2325  ?TROPONINIHS 11 9,806*  ?   ?Lipids  ?Lab Results  ?Component Value Date  ? CHOL 239 (H) 12/19/2021  ? HDL 26 (L) 12/19/2021  ? LDLCALC UNABLE TO CALCULATE IF TRIGLYCERIDE OVER 400 mg/dL 12/19/2021  ? LDLDIRECT 97.3 12/19/2021  ? TRIG 711 (H) 12/19/2021  ? CHOLHDL 9.2 12/19/2021  ? ? ?HbA1c  ?Lab Results  ?Component Value Date  ? HGBA1C 11.0 (H) 12/20/2021  ? ? ?Radiology  ?  ?-------------------- ? ?Telemetry  ?  ?RSR - Personally Reviewed ? ?Cardiac Studies  ? ?Cardiac Catheterization and Percutaneous Coronary Intervention 04.11.2023 ? ?Left Main  ?Vessel is large. Vessel is angiographically normal.  ?Left Anterior Descending  ?Vessel is large. The vessel is severely calcified.  ?Prox LAD lesion is 95% stenosed. Vessel is the culprit lesion. The lesion is thrombotic.  ?Mid LAD-1 lesion is 75% stenosed. The lesion is calcified. ?     **Successful  PCI to proximal/mid LAD using Onyx Frontier 2.75 x 34 mm drug-eluting stent (postdilated to 3.1 mm) with 10% residual stenosis and TIMI-3 flow**  ?Mid LAD-2 lesion is 40% stenosed.  ?First Diagonal Branch  ?Vessel is small in size.  ?Second Diagonal Branch  ?Vessel is moderate in size.  ?Left Circumflex  ?Vessel is large.  ?Dist Cx lesion is 60% stenosed.  ?First Obtuse Marginal Branch  ?Vessel is small in size.  ?Second Obtuse Marginal Branch  ?Vessel is small in size.  ?Third Obtuse Marginal Branch  ?Vessel is moderate in size.  ?Left Posterior Descending Artery  ?Vessel is moderate in size.  ?LPDA lesion is 100% stenosed.  ?First Left Posterolateral Branch  ?Vessel is small in size.  ?Second Left Posterolateral Branch  ?Vessel is small in size.  ?Right Coronary Artery (nondominant)  ?Vessel is moderate in size.  ?Prox RCA lesion is 50% stenosed.  ?Mid RCA lesion is 80% stenosed.  ? ?LVEDP 35 mmHg ?_____________  ? ?Patient Profile  ?   ?66 y.o. male w/ a h/o morbid obesity, HTN, HL, uncontrolled IDDM, and CKD IIIb, who presented 4/11 w/ chest pain and anterior STEMI.  S/p PCI/DES to the prox/mid LAD w/ residual diffuse, multivessel dzs. ? ?Assessment & Plan  ?  ?1.  Acute anterior STEMI/CAD: Patient presented with sudden onset of chest pain and found to have anterior ST segment elevation.  High-sensitivity troponin elevated to 9806.  Diagnostic catheterization revealed severe proximal and mid LAD disease, which was successfully stented.  He has residual moderate to severe left circumflex, LPDA, and nondominant right coronary artery disease.  Notes a very mild discomfort across his chest this morning-1-2/10.  Hemodynamically stable.  Echo pending.  Continue ASA, brilinta, ? blocker, and high potency statin rx. ? ?2.  Acute CHF:  LVEDP 64mHg at the time of his event.  Echo pending.  Appears euvolemic on examination.  Metoprolol transitioned to carvedilol this morning and titrated to 6.25 mg twice daily in the  setting of elevated pressures. ? ?3.  Essential HTN: Pressures higher this morning.  As above, metoprolol transition to carvedilol and titrated to 6.25 mg twice daily.  Consider ARB if renal function stable. ? ?4.  HL/HTG:  TC 239, TG 711.  Continue high potency statin therapy.  Fenofibrate has been added.  If not cost prohibitive, would switch to Vascepa. ? ?5.  CKD IIIb: Creatinine relatively stable @ 1.92 this AM.  Consider  ARB if stable. ? ?6.  Uncontrolled IDDM: A1c is 11.0.  Patient notes that he has had trouble affording insulin in the outpatient setting.  Insulin management per internal medicine.  Wilder Glade has been added here.  He does have insurance.  Will likely need to work with case management to understand what is going to be feasible in the outpatient setting. ? ?7.  Morbid Obesity: Will benefit from cardiac rehab in the outpatient setting. ? ?Signed, ?Murray Hodgkins, NP  ?12/20/2021, 2:05 PM   ? ?For questions or updates, please contact   ?Please consult [email or web address] for contact info under Cardiology/STEMI.  ?

## 2021-12-20 NOTE — Assessment & Plan Note (Addendum)
-   BMI greater than 35, with 1 comorbidity/DM ?

## 2021-12-20 NOTE — Assessment & Plan Note (Signed)
-   Morbid obesity ?- Recommend patient work with PCP for appropriate diet and activity as tolerated plan in order to encourage healthy weight loss ?

## 2021-12-20 NOTE — Progress Notes (Signed)
Inpatient Diabetes Program Recommendations ? ?AACE/ADA: New Consensus Statement on Inpatient Glycemic Control (2015) ? ?Target Ranges:  Prepandial:   less than 140 mg/dL ?     Peak postprandial:   less than 180 mg/dL (1-2 hours) ?     Critically ill patients:  140 - 180 mg/dL  ? ?Lab Results  ?Component Value Date  ? GLUCAP 389 (H) 12/20/2021  ? HGBA1C 11.0 (H) 12/20/2021  ? ? ?Review of Glycemic Control ? Latest Reference Range & Units 12/19/21 22:29 12/20/21 07:18  ?Glucose-Capillary 70 - 99 mg/dL 447 (H) 389 (H)  ?(H): Data is abnormally high ? ?Diabetes history: DM2 ?Outpatient Diabetes medications: NPH 200 units qd, Farxiga 10 mg qd, ? Trulicity ?Current orders for Inpatient glycemic control: Farxiga 10 mg qd, Novolog 0-20 units tid, 0-5 units hs ? ?Inpatient Diabetes Program Recommendations:   ?Please consider: ?-Levemir 15 units bid ac breakfast and supper (0.2 units/kg x 128.9 kg = 26 units) ?-Novolog 4 units tid meal coverage if eats 50% meal ?Secure chat sent to Dr. Manuella Ghazi. ? ?Patient has seen Dr. Loanne Drilling in the past for endocrinology with last office visit 10/06/21.  ? ?Thank you, ?Nani Gasser Desmin Daleo, RN, MSN, CDE  ?Diabetes Coordinator ?Inpatient Glycemic Control Team ?Team Pager [phone] (8am-5pm) ?12/20/2021 10:09 AM ? ? ? ? ? ?

## 2021-12-20 NOTE — Progress Notes (Incomplete)
Telephone report called to Edwyna Ready, RN.  Patient taken via bed to room 259 by this nurse. ?

## 2021-12-21 ENCOUNTER — Emergency Department: Payer: Medicare Other

## 2021-12-21 ENCOUNTER — Telehealth: Payer: Self-pay | Admitting: Nurse Practitioner

## 2021-12-21 ENCOUNTER — Inpatient Hospital Stay: Payer: Medicare Other

## 2021-12-21 ENCOUNTER — Encounter: Payer: Self-pay | Admitting: Emergency Medicine

## 2021-12-21 ENCOUNTER — Encounter: Payer: Self-pay | Admitting: Podiatry

## 2021-12-21 ENCOUNTER — Inpatient Hospital Stay
Admission: EM | Admit: 2021-12-21 | Discharge: 2021-12-25 | DRG: 065 | Disposition: A | Discharge status: Home Health | Payer: Medicare Other | Attending: Internal Medicine | Admitting: Internal Medicine

## 2021-12-21 DIAGNOSIS — S2242XA Multiple fractures of ribs, left side, initial encounter for closed fracture: Secondary | ICD-10-CM | POA: Diagnosis present

## 2021-12-21 DIAGNOSIS — E13621 Other specified diabetes mellitus with foot ulcer: Secondary | ICD-10-CM | POA: Diagnosis not present

## 2021-12-21 DIAGNOSIS — E781 Pure hyperglyceridemia: Secondary | ICD-10-CM | POA: Diagnosis present

## 2021-12-21 DIAGNOSIS — Y92008 Other place in unspecified non-institutional (private) residence as the place of occurrence of the external cause: Secondary | ICD-10-CM

## 2021-12-21 DIAGNOSIS — T383X5A Adverse effect of insulin and oral hypoglycemic [antidiabetic] drugs, initial encounter: Secondary | ICD-10-CM | POA: Diagnosis present

## 2021-12-21 DIAGNOSIS — Z8249 Family history of ischemic heart disease and other diseases of the circulatory system: Secondary | ICD-10-CM

## 2021-12-21 DIAGNOSIS — Z955 Presence of coronary angioplasty implant and graft: Secondary | ICD-10-CM

## 2021-12-21 DIAGNOSIS — I77810 Thoracic aortic ectasia: Secondary | ICD-10-CM | POA: Diagnosis present

## 2021-12-21 DIAGNOSIS — R6510 Systemic inflammatory response syndrome (SIRS) of non-infectious origin without acute organ dysfunction: Secondary | ICD-10-CM | POA: Diagnosis present

## 2021-12-21 DIAGNOSIS — I2510 Atherosclerotic heart disease of native coronary artery without angina pectoris: Secondary | ICD-10-CM | POA: Diagnosis present

## 2021-12-21 DIAGNOSIS — I951 Orthostatic hypotension: Secondary | ICD-10-CM | POA: Diagnosis present

## 2021-12-21 DIAGNOSIS — R0989 Other specified symptoms and signs involving the circulatory and respiratory systems: Secondary | ICD-10-CM | POA: Diagnosis present

## 2021-12-21 DIAGNOSIS — I63511 Cerebral infarction due to unspecified occlusion or stenosis of right middle cerebral artery: Secondary | ICD-10-CM | POA: Diagnosis not present

## 2021-12-21 DIAGNOSIS — R7989 Other specified abnormal findings of blood chemistry: Secondary | ICD-10-CM | POA: Diagnosis present

## 2021-12-21 DIAGNOSIS — I2102 ST elevation (STEMI) myocardial infarction involving left anterior descending coronary artery: Secondary | ICD-10-CM | POA: Diagnosis present

## 2021-12-21 DIAGNOSIS — L97529 Non-pressure chronic ulcer of other part of left foot with unspecified severity: Secondary | ICD-10-CM | POA: Diagnosis present

## 2021-12-21 DIAGNOSIS — Z7902 Long term (current) use of antithrombotics/antiplatelets: Secondary | ICD-10-CM

## 2021-12-21 DIAGNOSIS — F32A Depression, unspecified: Secondary | ICD-10-CM | POA: Diagnosis present

## 2021-12-21 DIAGNOSIS — I5042 Chronic combined systolic (congestive) and diastolic (congestive) heart failure: Secondary | ICD-10-CM | POA: Diagnosis present

## 2021-12-21 DIAGNOSIS — E871 Hypo-osmolality and hyponatremia: Secondary | ICD-10-CM | POA: Diagnosis present

## 2021-12-21 DIAGNOSIS — S2249XA Multiple fractures of ribs, unspecified side, initial encounter for closed fracture: Secondary | ICD-10-CM | POA: Diagnosis present

## 2021-12-21 DIAGNOSIS — E1122 Type 2 diabetes mellitus with diabetic chronic kidney disease: Secondary | ICD-10-CM | POA: Diagnosis present

## 2021-12-21 DIAGNOSIS — W19XXXA Unspecified fall, initial encounter: Secondary | ICD-10-CM | POA: Diagnosis present

## 2021-12-21 DIAGNOSIS — I213 ST elevation (STEMI) myocardial infarction of unspecified site: Secondary | ICD-10-CM

## 2021-12-21 DIAGNOSIS — E1151 Type 2 diabetes mellitus with diabetic peripheral angiopathy without gangrene: Secondary | ICD-10-CM | POA: Diagnosis present

## 2021-12-21 DIAGNOSIS — E113592 Type 2 diabetes mellitus with proliferative diabetic retinopathy without macular edema, left eye: Secondary | ICD-10-CM | POA: Diagnosis present

## 2021-12-21 DIAGNOSIS — Z794 Long term (current) use of insulin: Secondary | ICD-10-CM

## 2021-12-21 DIAGNOSIS — R778 Other specified abnormalities of plasma proteins: Secondary | ICD-10-CM | POA: Diagnosis present

## 2021-12-21 DIAGNOSIS — Z7982 Long term (current) use of aspirin: Secondary | ICD-10-CM

## 2021-12-21 DIAGNOSIS — Z7984 Long term (current) use of oral hypoglycemic drugs: Secondary | ICD-10-CM

## 2021-12-21 DIAGNOSIS — Z888 Allergy status to other drugs, medicaments and biological substances status: Secondary | ICD-10-CM

## 2021-12-21 DIAGNOSIS — Z20822 Contact with and (suspected) exposure to covid-19: Secondary | ICD-10-CM | POA: Diagnosis present

## 2021-12-21 DIAGNOSIS — R29700 NIHSS score 0: Secondary | ICD-10-CM | POA: Diagnosis present

## 2021-12-21 DIAGNOSIS — Z833 Family history of diabetes mellitus: Secondary | ICD-10-CM

## 2021-12-21 DIAGNOSIS — E876 Hypokalemia: Secondary | ICD-10-CM | POA: Diagnosis not present

## 2021-12-21 DIAGNOSIS — D849 Immunodeficiency, unspecified: Secondary | ICD-10-CM | POA: Diagnosis present

## 2021-12-21 DIAGNOSIS — G4733 Obstructive sleep apnea (adult) (pediatric): Secondary | ICD-10-CM | POA: Diagnosis present

## 2021-12-21 DIAGNOSIS — E11621 Type 2 diabetes mellitus with foot ulcer: Secondary | ICD-10-CM | POA: Diagnosis present

## 2021-12-21 DIAGNOSIS — Z83438 Family history of other disorder of lipoprotein metabolism and other lipidemia: Secondary | ICD-10-CM

## 2021-12-21 DIAGNOSIS — I130 Hypertensive heart and chronic kidney disease with heart failure and stage 1 through stage 4 chronic kidney disease, or unspecified chronic kidney disease: Secondary | ICD-10-CM | POA: Diagnosis present

## 2021-12-21 DIAGNOSIS — W1830XA Fall on same level, unspecified, initial encounter: Secondary | ICD-10-CM | POA: Diagnosis present

## 2021-12-21 DIAGNOSIS — N179 Acute kidney failure, unspecified: Secondary | ICD-10-CM | POA: Diagnosis present

## 2021-12-21 DIAGNOSIS — F339 Major depressive disorder, recurrent, unspecified: Secondary | ICD-10-CM | POA: Diagnosis not present

## 2021-12-21 DIAGNOSIS — I959 Hypotension, unspecified: Secondary | ICD-10-CM | POA: Diagnosis present

## 2021-12-21 DIAGNOSIS — I639 Cerebral infarction, unspecified: Secondary | ICD-10-CM | POA: Diagnosis present

## 2021-12-21 DIAGNOSIS — N1832 Chronic kidney disease, stage 3b: Secondary | ICD-10-CM | POA: Diagnosis present

## 2021-12-21 DIAGNOSIS — E785 Hyperlipidemia, unspecified: Secondary | ICD-10-CM | POA: Diagnosis present

## 2021-12-21 DIAGNOSIS — Z79899 Other long term (current) drug therapy: Secondary | ICD-10-CM

## 2021-12-21 DIAGNOSIS — I255 Ischemic cardiomyopathy: Secondary | ICD-10-CM | POA: Diagnosis present

## 2021-12-21 DIAGNOSIS — E1169 Type 2 diabetes mellitus with other specified complication: Secondary | ICD-10-CM | POA: Diagnosis present

## 2021-12-21 DIAGNOSIS — E1165 Type 2 diabetes mellitus with hyperglycemia: Secondary | ICD-10-CM | POA: Diagnosis present

## 2021-12-21 HISTORY — DX: Thoracic aortic ectasia: I77.810

## 2021-12-21 HISTORY — DX: Unspecified systolic (congestive) heart failure: I50.20

## 2021-12-21 HISTORY — DX: Atherosclerotic heart disease of native coronary artery without angina pectoris: I25.10

## 2021-12-21 HISTORY — DX: Ischemic cardiomyopathy: I25.5

## 2021-12-21 HISTORY — DX: Chronic kidney disease, stage 3 unspecified: N18.30

## 2021-12-21 LAB — LIPASE, BLOOD: Lipase: 28 U/L (ref 11–51)

## 2021-12-21 LAB — COMPREHENSIVE METABOLIC PANEL
ALT: 68 U/L — ABNORMAL HIGH (ref 0–44)
AST: 91 U/L — ABNORMAL HIGH (ref 15–41)
Albumin: 3.5 g/dL (ref 3.5–5.0)
Alkaline Phosphatase: 50 U/L (ref 38–126)
Anion gap: 13 (ref 5–15)
BUN: 45 mg/dL — ABNORMAL HIGH (ref 8–23)
CO2: 25 mmol/L (ref 22–32)
Calcium: 9.1 mg/dL (ref 8.9–10.3)
Chloride: 95 mmol/L — ABNORMAL LOW (ref 98–111)
Creatinine, Ser: 2.19 mg/dL — ABNORMAL HIGH (ref 0.61–1.24)
GFR, Estimated: 32 mL/min — ABNORMAL LOW (ref >60)
Glucose, Bld: 224 mg/dL — ABNORMAL HIGH (ref 70–99)
Potassium: 3.9 mmol/L (ref 3.5–5.1)
Sodium: 133 mmol/L — ABNORMAL LOW (ref 135–145)
Total Bilirubin: 2 mg/dL — ABNORMAL HIGH (ref 0.3–1.2)
Total Protein: 7.5 g/dL (ref 6.5–8.1)

## 2021-12-21 LAB — BASIC METABOLIC PANEL
Anion gap: 12 (ref 5–15)
BUN: 39 mg/dL — ABNORMAL HIGH (ref 8–23)
CO2: 28 mmol/L (ref 22–32)
Calcium: 9.1 mg/dL (ref 8.9–10.3)
Chloride: 98 mmol/L (ref 98–111)
Creatinine, Ser: 2.08 mg/dL — ABNORMAL HIGH (ref 0.61–1.24)
GFR, Estimated: 34 mL/min — ABNORMAL LOW (ref >60)
Glucose, Bld: 246 mg/dL — ABNORMAL HIGH (ref 70–99)
Potassium: 3.8 mmol/L (ref 3.5–5.1)
Sodium: 138 mmol/L (ref 135–145)

## 2021-12-21 LAB — CBC WITH DIFFERENTIAL/PLATELET
Abs Immature Granulocytes: 0.07 10*3/uL (ref 0.00–0.07)
Basophils Absolute: 0 10*3/uL (ref 0.0–0.1)
Basophils Relative: 0 %
Eosinophils Absolute: 0 10*3/uL (ref 0.0–0.5)
Eosinophils Relative: 0 %
HCT: 46.6 % (ref 39.0–52.0)
Hemoglobin: 15.5 g/dL (ref 13.0–17.0)
Immature Granulocytes: 0 %
Lymphocytes Relative: 9 %
Lymphs Abs: 1.5 10*3/uL (ref 0.7–4.0)
MCH: 30 pg (ref 26.0–34.0)
MCHC: 33.3 g/dL (ref 30.0–36.0)
MCV: 90.3 fL (ref 80.0–100.0)
Monocytes Absolute: 1.6 10*3/uL — ABNORMAL HIGH (ref 0.1–1.0)
Monocytes Relative: 10 %
Neutro Abs: 12.6 10*3/uL — ABNORMAL HIGH (ref 1.7–7.7)
Neutrophils Relative %: 81 %
Platelets: 280 10*3/uL (ref 150–400)
RBC: 5.16 MIL/uL (ref 4.22–5.81)
RDW: 13.6 % (ref 11.5–15.5)
WBC: 15.8 10*3/uL — ABNORMAL HIGH (ref 4.0–10.5)
nRBC: 0 % (ref 0.0–0.2)

## 2021-12-21 LAB — GLUCOSE, CAPILLARY
Glucose-Capillary: 241 mg/dL — ABNORMAL HIGH (ref 70–99)
Glucose-Capillary: 222 mg/dL — ABNORMAL HIGH (ref 70–99)

## 2021-12-21 LAB — TROPONIN I (HIGH SENSITIVITY): Troponin I (High Sensitivity): >24000 ng/L (ref <18)

## 2021-12-21 IMAGING — DX DG CHEST 1V PORT
1 series · 1 of 1 positions shown · non-contrast
Comparison: [DATE]

CLINICAL DATA: Chest pain

EXAM:
PORTABLE CHEST 1 VIEW

[chest ap]
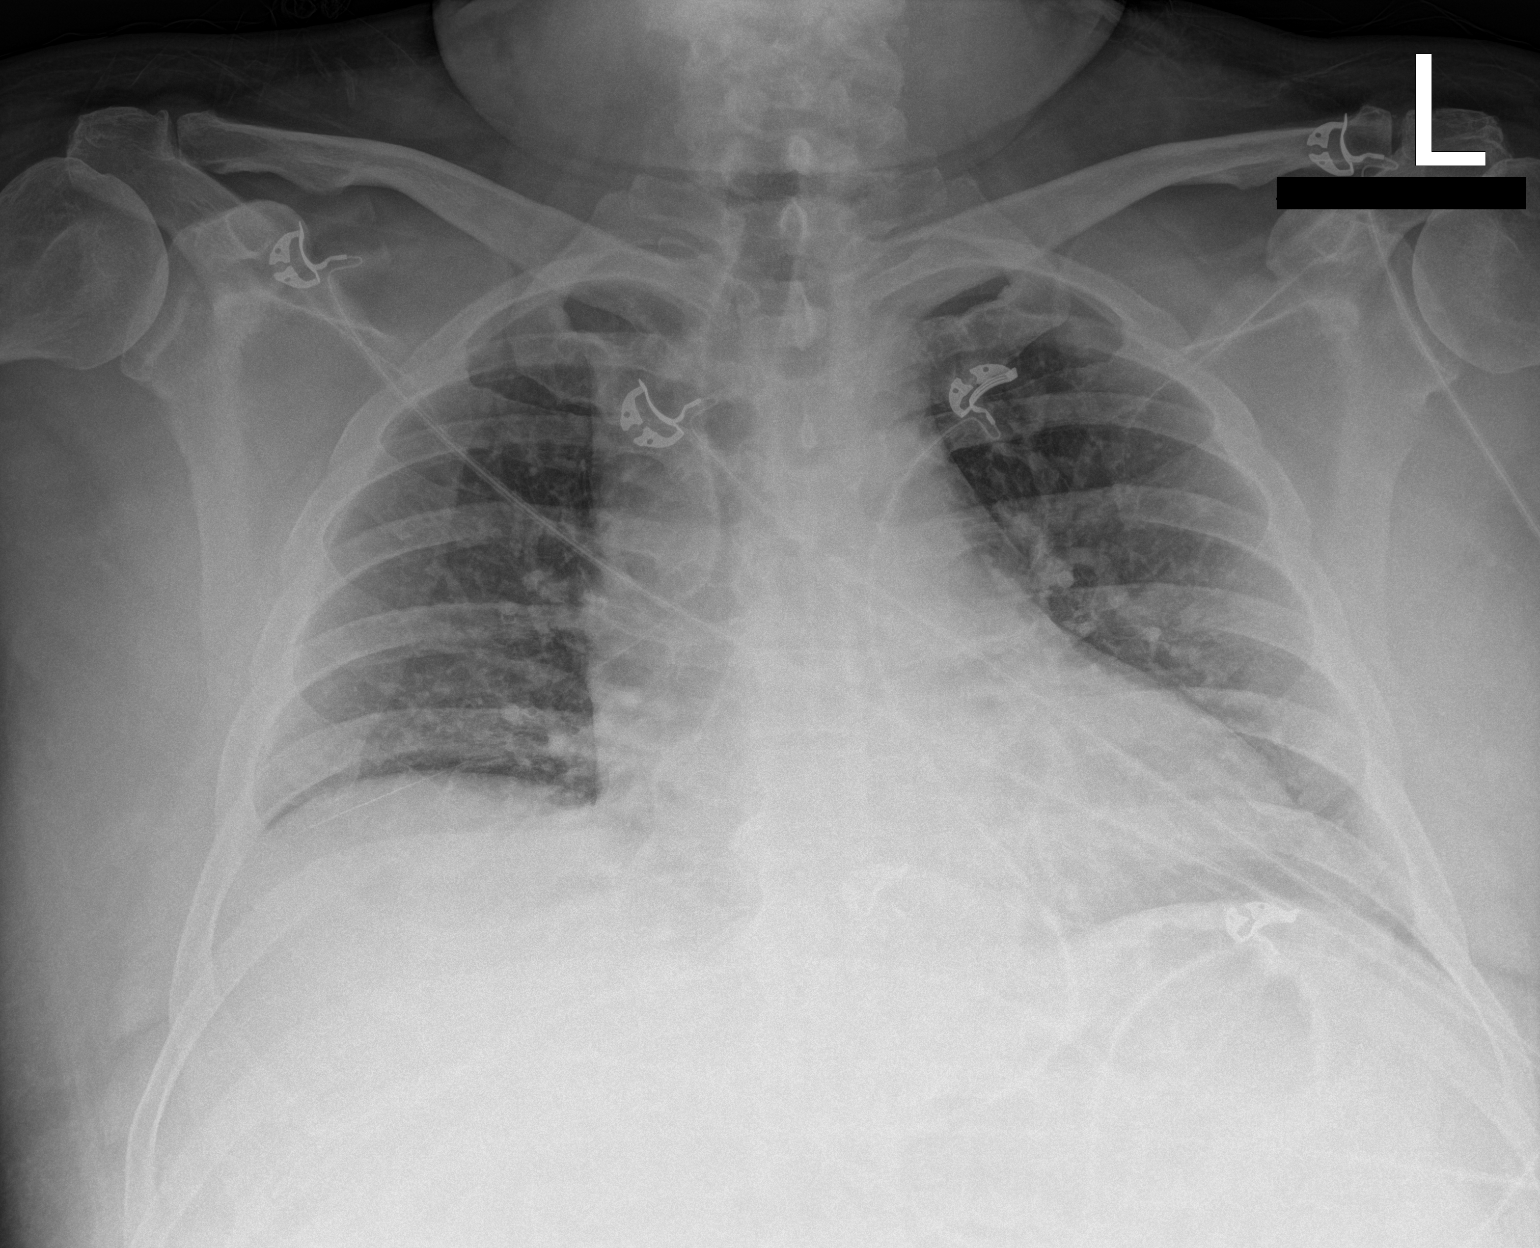

[1 of 1 positions shown; findings below may reference images not displayed]

FINDINGS: Cardiac shadow is prominent but accentuated by the frontal
technique. Lungs are clear bilaterally. No focal infiltrate or
effusion is seen. No bony abnormality is noted.
IMPRESSION: No active disease.

## 2021-12-21 IMAGING — CT CT CHEST-ABD-PELV W/O CM
2 of 4 series · 14 of 36 positions shown, 16 images · non-contrast
Comparison: Chest radiograph earlier today.

CLINICAL DATA: Polytrauma, blunt

Patient reports falling onto a piece of QUIRIJN on left side.
EXAM:
CT CHEST, ABDOMEN AND PELVIS WITHOUT CONTRAST
TECHNIQUE: Multidetector CT imaging of the chest, abdomen and pelvis was
performed following the standard protocol without IV contrast.
RADIATION DOSE REDUCTION: This exam was performed according to the
departmental dose-optimization program which includes automated
exposure control, adjustment of the mA and/or kV according to
patient size and/or use of iterative reconstruction technique.

[Series 2: cap wo st · axial · 0.98mm/px · z∈[-708,-98]mm · 11 of 146 slices shown, 13 images]
[im 12/146  mediastinal]
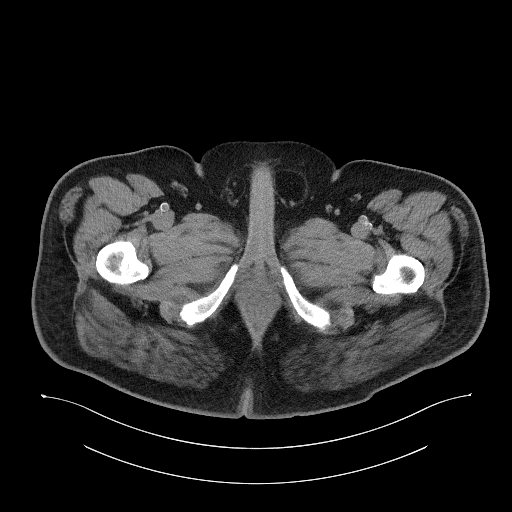
[im 12/146  bone]
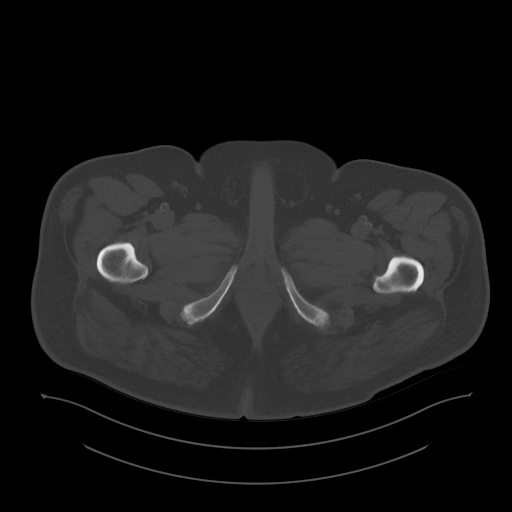
[im 23/146  mediastinal]
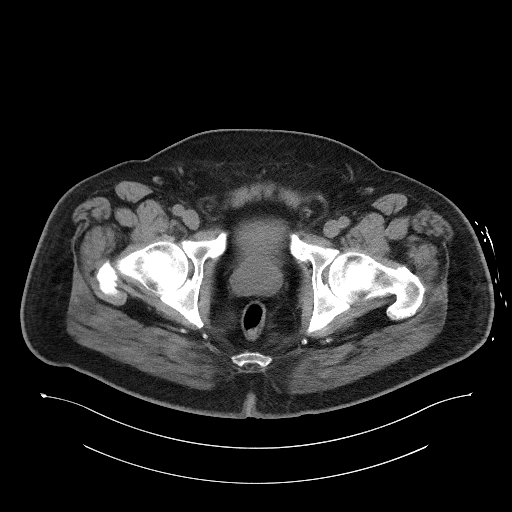
[im 34/146  mediastinal]
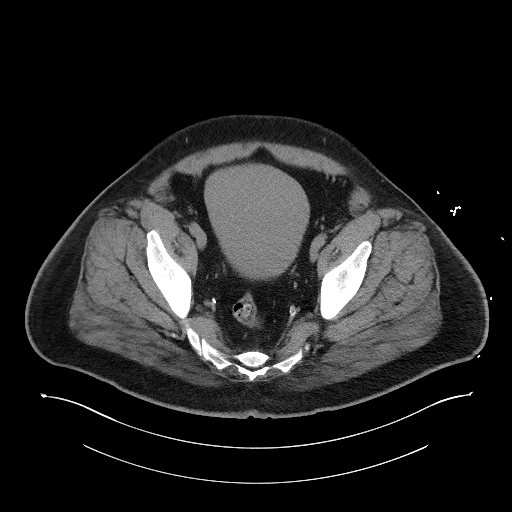
[im 45/146  mediastinal]
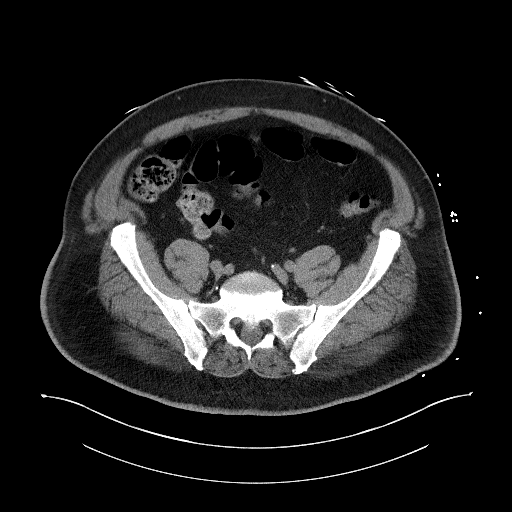
[im 56/146  mediastinal]
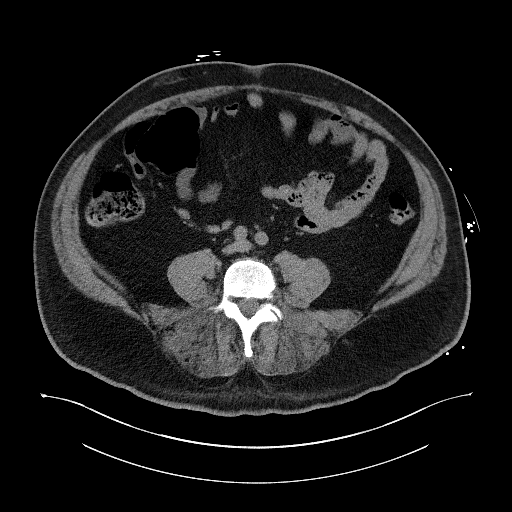
[im 79/146  mediastinal]
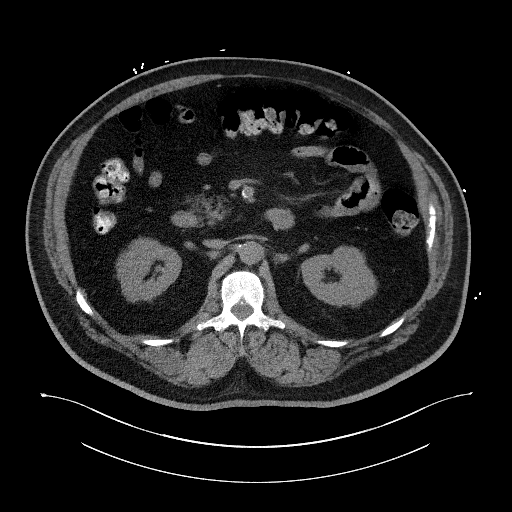
[im 90/146  mediastinal]
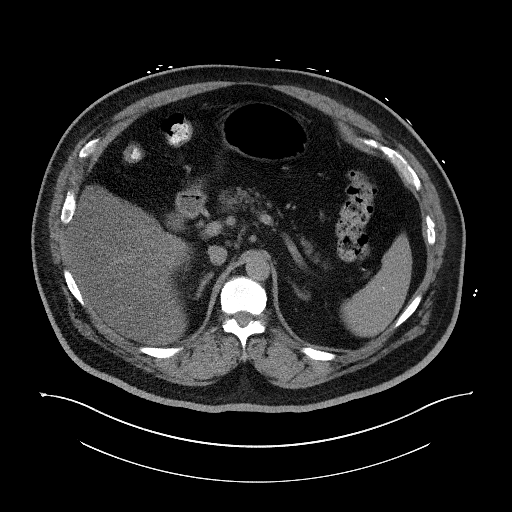
[im 101/146  mediastinal]
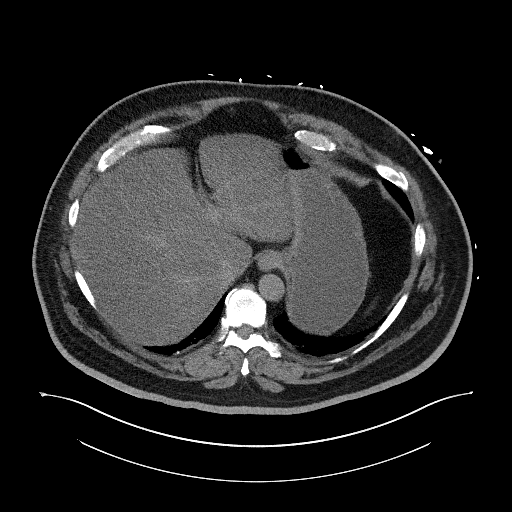
[im 112/146  mediastinal]
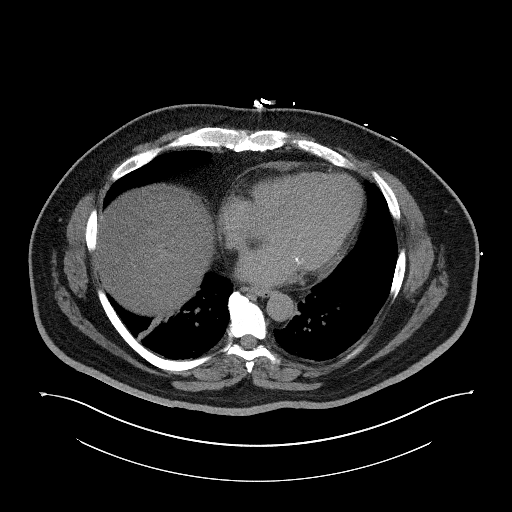
[im 112/146  bone]
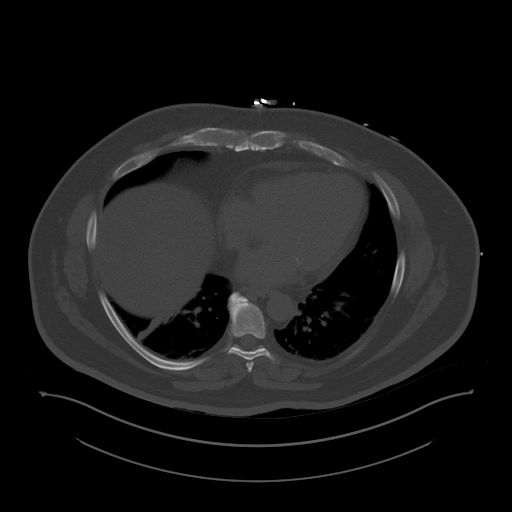
[im 123/146  mediastinal]
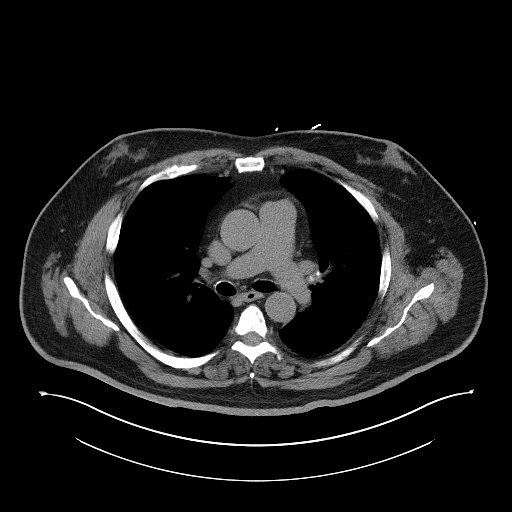
[im 134/146  mediastinal]
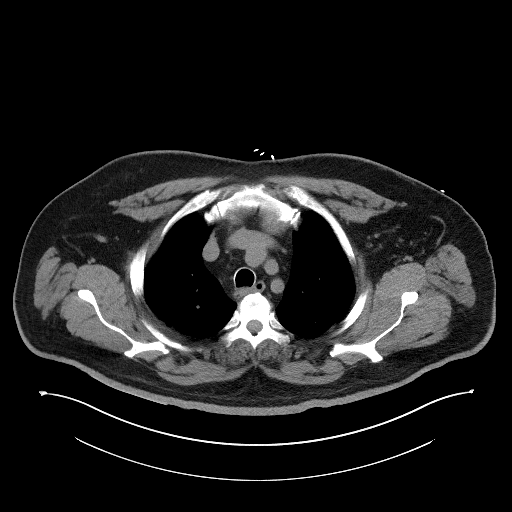

[Series 5: coronal · coronal · 0.95mm/px · 3 of 180 slices shown]
[im 36/180  mediastinal]
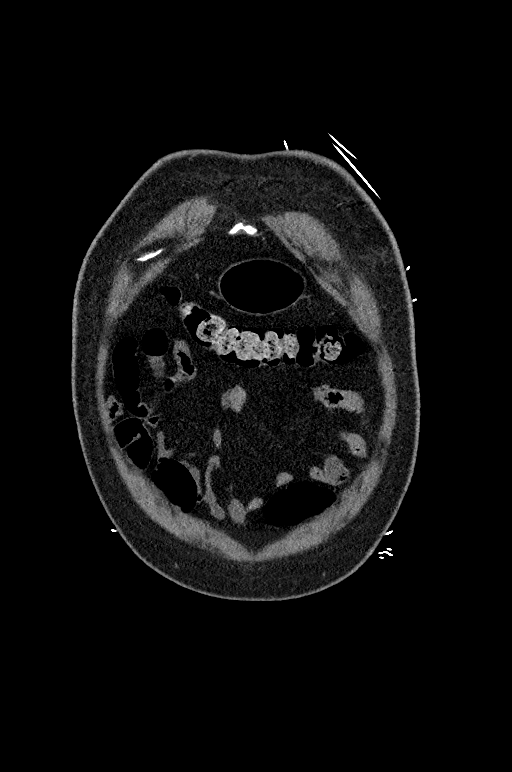
[im 72/180  mediastinal]
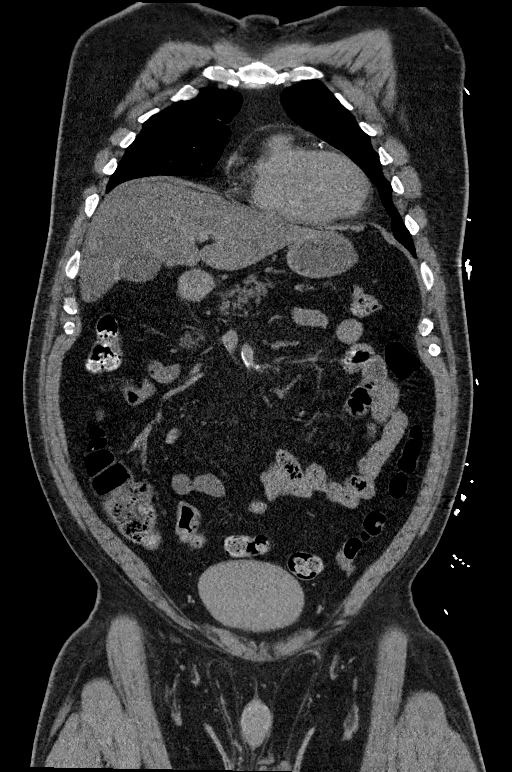
[im 108/180  mediastinal]
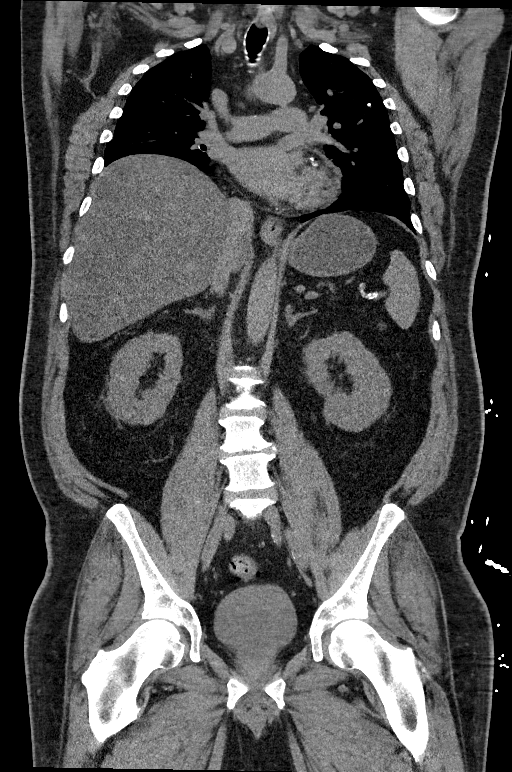

[14 of 36 positions shown; findings below may reference images not displayed]

FINDINGS: CT CHEST FINDINGS

Cardiovascular: Thoracic aorta is normal in caliber. No periaortic
stranding to suggest injury. The heart is normal in size. Coronary
artery calcifications/stent. Minimal pericardial effusion measures
up to 8 mm in depth anteriorly.

Mediastinum/Nodes: No mediastinal hemorrhage. No bulky mediastinal
adenopathy. Few calcified left hilar lymph nodes. No thyroid nodule.
No esophageal wall thickening.

Lungs/Pleura: No pneumothorax. No confluent pulmonary contusion.
Bandlike atelectasis in the right lower lobe. No pleural effusion.
Calcified left upper lobe nodule consistent with prior granulomatous
disease, benign needs no further follow-up. No noncalcified nodule.
The trachea and central bronchi are patent.

Musculoskeletal: Minimally displaced fractures of left fifth, sixth,
seventh, and eighth ribs, nondisplaced left anterior ninth rib
fracture and fracture through the anterior tenth rib chondral
cartilage. Mild adjacent patchy soft tissue edema in the left
anterior chest wall. No fracture of the sternum, included clavicles,
shoulder girdles, right ribs, or thoracic spine.

CT ABDOMEN PELVIS FINDINGS

Hepatobiliary: Hepatic steatosis. No evidence of hepatic injury or
perihepatic hematoma. Calcified gallstones. No abnormal gallbladder
distension. No biliary dilatation.

Pancreas: Fatty atrophy. No evidence of injury. No ductal dilatation
or inflammation.

Spleen: Scattered calcified granuloma. No splenic injury or
perisplenic hematoma.

Adrenals/Urinary Tract: No adrenal hemorrhage or renal injury
identified, limited assessment for renal injury on this unenhanced
exam. No significant perinephric edema or perinephric fluid. Bladder
is unremarkable.

Stomach/Bowel: Ingested material distends the stomach. There is no
bowel obstruction or inflammation. No evidence of bowel injury or
mesenteric hematoma. Appendix not confidently visualized. Moderate
volume of colonic stool.

Vascular/Lymphatic: Normal caliber abdominal aorta. There is no
retroperitoneal fluid to suggest injury. Scattered periportal lymph
nodes typically reactive. No enlarged lymph nodes in the abdomen or
pelvis.

Reproductive: Prostate is unremarkable.

Other: No free air or free fluid. Minimal soft tissue edema in the
right anterior abdominal wall, possible contusion or medication
injection site. Small fat containing inguinal hernias.

Musculoskeletal: No fracture of the lumbar spine or pelvis.
Scattered bone islands. L3-L4 and L4-L5 degenerative disc disease.
L3-L4 endplate irregularity.
IMPRESSION: 1. Minimally displaced left fifth through eighth rib fractures,
nondisplaced left anterior ninth rib fracture and fracture through
the anterior tenth rib chondral cartilage.
2. No pneumothorax or pulmonary contusion.
3. No evidence of acute traumatic injury to the abdomen or pelvis.
4. Incidental findings in the abdomen and pelvis of hepatic
steatosis and cholelithiasis.

## 2021-12-21 MED ORDER — INSULIN DETEMIR 100 UNIT/ML FLEXPEN: 20.0000 [IU] | PEN_INJECTOR | Freq: Two times a day (BID) | SUBCUTANEOUS | 0 refills | Status: AC

## 2021-12-21 MED ORDER — INSULIN ASPART 100 UNIT/ML IJ SOLN: 4.0000 [IU] | Freq: Three times a day (TID) | INTRAMUSCULAR | 11 refills | Status: DC

## 2021-12-21 MED ORDER — CARVEDILOL 3.125 MG PO TABS: 3.1250 mg | ORAL_TABLET | Freq: Two times a day (BID) | ORAL | 0 refills | Status: AC

## 2021-12-21 MED ORDER — HYDROMORPHONE HCL 1 MG/ML IJ SOLN
0.5000 mg | Freq: Once | INTRAMUSCULAR | Status: AC
  Administered 2021-12-21: 0.5 mg via INTRAVENOUS
  Filled 2021-12-21: qty 1

## 2021-12-21 MED ORDER — ONDANSETRON HCL 4 MG/2ML IJ SOLN
4.0000 mg | Freq: Once | INTRAMUSCULAR | Status: AC
  Administered 2021-12-21: 4 mg via INTRAVENOUS
  Filled 2021-12-21: qty 2

## 2021-12-21 MED ORDER — LOSARTAN POTASSIUM 25 MG PO TABS
25.0000 mg | ORAL_TABLET | Freq: Every day | ORAL | Status: DC
  Administered 2021-12-21: 25 mg via ORAL
  Filled 2021-12-21: qty 1

## 2021-12-21 MED ORDER — ASPIRIN 81 MG PO CHEW: 81.0000 mg | CHEWABLE_TABLET | Freq: Every day | ORAL | 0 refills | Status: AC

## 2021-12-21 MED ORDER — LOSARTAN POTASSIUM 25 MG PO TABS: 25.0000 mg | ORAL_TABLET | Freq: Every day | ORAL | 0 refills | Status: AC

## 2021-12-21 MED ORDER — TICAGRELOR 90 MG PO TABS: 90.0000 mg | ORAL_TABLET | Freq: Two times a day (BID) | ORAL | 0 refills | Status: AC

## 2021-12-21 MED ORDER — FENOFIBRATE 160 MG PO TABS: 160.0000 mg | ORAL_TABLET | Freq: Every day | ORAL | 0 refills | Status: AC

## 2021-12-21 NOTE — Progress Notes (Addendum)
? ?Cardiology Progress Note  ? ?Patient Name: Andrew Pruitt ?Date of Encounter: 12/21/2021 ? ?Primary Cardiologist: Nelva Bush, MD ? ?Subjective  ? ?Feels well this AM.  Ambulated some last night w/o difficulty.  No chest pain or dyspnea. ? ?Inpatient Medications  ?  ?Scheduled Meds: ? vitamin C  250 mg Oral BID  ? aspirin  81 mg Oral Daily  ? atorvastatin  80 mg Oral Daily  ? carvedilol  3.125 mg Oral BID WC  ? Chlorhexidine Gluconate Cloth  6 each Topical Q0600  ? dapagliflozin propanediol  10 mg Oral QAC breakfast  ? fenofibrate  160 mg Oral Daily  ? gabapentin  100 mg Oral TID  ? heparin  5,000 Units Subcutaneous Q8H  ? insulin aspart  0-20 Units Subcutaneous TID WC  ? insulin aspart  0-5 Units Subcutaneous QHS  ? insulin aspart  4 Units Subcutaneous TID WC  ? insulin detemir  15 Units Subcutaneous BID  ? multivitamin with minerals  1 tablet Oral Daily  ? Ensure Max Protein  11 oz Oral BID  ? sodium chloride flush  3 mL Intravenous Q12H  ? ticagrelor  90 mg Oral BID  ? venlafaxine XR  75 mg Oral Q breakfast  ? ?Continuous Infusions: ? sodium chloride    ? ?PRN Meds: ?sodium chloride, acetaminophen, hydrALAZINE, melatonin, nitroGLYCERIN, ondansetron (ZOFRAN) IV, polyethylene glycol, sodium chloride flush  ? ?Vital Signs  ?  ?Vitals:  ? 12/20/21 2046 12/21/21 0134 12/21/21 0438 12/21/21 0714  ?BP: 121/79 127/79 131/77 126/78  ?Pulse: 92 94 94 94  ?Resp: (!) '22 20 18 18  '$ ?Temp: 98.2 ?F (36.8 ?C) 98.1 ?F (36.7 ?C) 99.5 ?F (37.5 ?C) 98.5 ?F (36.9 ?C)  ?TempSrc:   Oral Oral  ?SpO2: (!) 87% 95% 97% 95%  ?Weight:      ?Height:      ? ? ?Intake/Output Summary (Last 24 hours) at 12/21/2021 0802 ?Last data filed at 12/20/2021 1500 ?Gross per 24 hour  ?Intake 479.84 ml  ?Output 401 ml  ?Net 78.84 ml  ? ?Filed Weights  ? 12/19/21 2058 12/19/21 2230  ?Weight: (!) 139.5 kg 128.9 kg  ? ? ?Physical Exam  ? ?GEN: Well nourished, well developed, in no acute distress.  ?HEENT: Grossly normal.  ?Neck: Supple, no JVD, carotid  bruits, or masses. ?Cardiac: RRR, no murmurs, rubs, or gallops. No clubbing, cyanosis, edema.  Radials 2+, DP/PT 1+ and equal bilaterally.  R radial cath site w/o bleeding/bruit/hematoma. ?Respiratory:  Respirations regular and unlabored, clear to auscultation bilaterally. ?GI: Soft, nontender, nondistended, BS + x 4. ?MS: no deformity or atrophy. ?Skin: warm and dry, no rash. ?Neuro:  Strength and sensation are intact. ?Psych: AAOx3.  Normal affect. ? ?Labs  ?  ?Chemistry ?Recent Labs  ?Lab 12/19/21 ?2054 12/20/21 ?0207  ?NA 134* 136  ?K 4.0 3.8  ?CL 97* 99  ?CO2 26 25  ?GLUCOSE 472* 450*  ?BUN 27* 31*  ?CREATININE 2.09* 1.92*  ?CALCIUM 9.0 8.8*  ?PROT 7.2  --   ?ALBUMIN 3.6  --   ?AST 28  --   ?ALT 53*  --   ?ALKPHOS 52  --   ?BILITOT 0.7  --   ?GFRNONAA 34* 38*  ?ANIONGAP 11 12  ?  ? ?Hematology ?Recent Labs  ?Lab 12/19/21 ?2054 12/20/21 ?0207  ?WBC 7.3 10.6*  ?RBC 5.15 5.41  ?HGB 15.6 16.3  ?HCT 45.3 47.5  ?MCV 88.0 87.8  ?MCH 30.3 30.1  ?MCHC 34.4 34.3  ?RDW 13.2  13.2  ?PLT 306 318  ? ? ?Cardiac Enzymes  ?Recent Labs  ?Lab 12/19/21 ?2054 12/19/21 ?2325  ?TROPONINIHS 11 9,806*  ?   ?Lipids  ?Lab Results  ?Component Value Date  ? CHOL 239 (H) 12/19/2021  ? HDL 26 (L) 12/19/2021  ? LDLCALC UNABLE TO CALCULATE IF TRIGLYCERIDE OVER 400 mg/dL 12/19/2021  ? LDLDIRECT 97.3 12/19/2021  ? TRIG 711 (H) 12/19/2021  ? CHOLHDL 9.2 12/19/2021  ? ? ?HbA1c  ?Lab Results  ?Component Value Date  ? HGBA1C 11.0 (H) 12/20/2021  ? ? ?Radiology  ?  ?---------------- ? ?Telemetry  ?  ?RSR, rare PVCs - Personally Reviewed ? ?Cardiac Studies  ? ?Cardiac Catheterization and Percutaneous Coronary Intervention 04.11.2023 ?  ?           ?Left Main   ?Vessel is large. Vessel is angiographically normal.   ?Left Anterior Descending  ?Vessel is large. The vessel is severely calcified.  ?Prox LAD lesion is 95% stenosed. Vessel is the culprit lesion. The lesion is thrombotic.  ?Mid LAD-1 lesion is 75% stenosed. The lesion is calcified. ?      **Successful PCI to proximal/mid LAD using Onyx Frontier 2.75 x 34 mm drug-eluting stent (postdilated to 3.1 mm) with 10% residual stenosis and TIMI-3 flow**  ?Mid LAD-2 lesion is 40% stenosed.  ?First Diagonal Branch           ?Vessel is small in size.           ?Second Diagonal Branch          ?Vessel is moderate in size.          ?Left Circumflex      ?Vessel is large.      ?Dist Cx lesion is 60% stenosed.      ?First Obtuse Marginal Branch         ?Vessel is small in size.         ?Second Obtuse Marginal Branch     ?Vessel is small in size.     ?Third Obtuse Marginal Branch        ?Vessel is moderate in size.        ?Left Posterior Descending Artery     ?Vessel is moderate in size.     ?LPDA lesion is 100% stenosed.     ?First Left Posterolateral Branch       ?Vessel is small in size.       ?Second Left Posterolateral Branch    ?Vessel is small in size.    ?Right Coronary Artery (nondominant)    ?Vessel is moderate in size.    ?Prox RCA lesion is 50% stenosed.    ?Mid RCA lesion is 80% stenosed.    ?  ?LVEDP 35 mmHg ?_____________  ?  ?2D Echocardiogram 04.12.2023 ? ?1. Left ventricular ejection fraction, by estimation, is 35 to 40%. The  ?left ventricle has moderately decreased function. The left ventricle  ?demonstrates regional wall motion abnormalities (see scoring  ?diagram/findings for description). There is mild  ?left ventricular hypertrophy. Left ventricular diastolic parameters are  ?consistent with Grade I diastolic dysfunction (impaired relaxation). There  ?is akinesis of the left ventricular, mid-apical anteroseptal wall, apical  ?segment and anterior segment.  ? 2. Right ventricular systolic function is normal. The right ventricular  ?size is not well visualized.  ? 3. The mitral valve is degenerative. No evidence of mitral valve  ?regurgitation.  ? 4. The aortic valve is tricuspid. Aortic valve regurgitation is not  ?visualized.  Aortic valve sclerosis is present, with no evidence of aortic  ?valve  stenosis.  ? 5. Aortic dilatation noted. There is mild dilatation of the aortic root,  ?measuring 39 mm.  ?_____________  ? ?Patient Profile  ?   ?66 y.o. male w/ a h/o morbid obesity, HTN, HL, uncontrolled IDDM, and CKD IIIb, who presented 4/11 w/ chest pain and anterior STEMI.  S/p PCI/DES to the prox/mid LAD w/ residual diffuse, multivessel dzs. ? ?Assessment & Plan  ?  ?1.  Acute anterior STEMI/CAD: Patient presented with sudden onset of chest pain and found to have anterior ST segment elevation.  High-sensitivity troponin elevated to 9806.  Diagnostic catheterization revealed severe proximal and mid LAD disease, which was successfully stented.  He has residual moderate to severe left circumflex, LPDA, and nondominant right coronary artery disease.  Feels well this AM w/o chest pain or dyspnea.  R radial site looks good.  Ambulate this AM.  Cont asa, brilinta, ? blocker, and high potency statin rx.  Would benefit from vascepa in setting of elev TG, though likely cost-prohibitive. ? ?2.  Acute systolic CHF/ICM:  LVEDP 83TDVV @ time of cath.  EF35-40% by echo w/ mid-apical anteroseptal/apical/ant AK, and grI DD.  Euvolemic on exam.  F/u BMET this AM.  If creat stable, will add low-dose losartan.  Cont carvedilol and sglt2i.  Not currently requiring oral diuretic - will need to reassess early in outpt setting. ? ?3.  Essential HTN:  Stable on ? blocker.  F/u bmet and look to add ARB if creat stable. ? ?4.  HL/HTG:  TC 239, TG 711.  Cont high potency statin Rx and fibrate.  In setting of #1, would like to switch to vascepa, but likely cost-prohibitive per pt. ? ?5.  CKD IIIb:  f/u this AM.  Add ARB if stable.  Cont farxiga. ? ?6.  Uncontrolled IDDM:  A1c 11.0.  Had difficulty affording insulin in outpt setting.  Wilder Glade added here - will likely need assistance in affording. ? ?7.  Morbid obesity:  Discussed importance of cardiac rehab following d/c - pt seems interested. ? ?8.  Dilated Ao root:  65m on echo.  BP  stable.  F/u imaging in a year. ? ?9.  L foot wound:  seen by podiatry.  ABIs ordered for today. ? ?Signed, ?CMurray Hodgkins NP  ?12/21/2021, 8:02 AM   ? ?For questions or updates, please contact   ?Please c

## 2021-12-21 NOTE — ED Provider Notes (Signed)
? ?[facility] ?Provider Note ? ? ? Event Date/Time  ? First MD Initiated Contact with Patient 12/21/21 2041   ?  (approximate) ? ? ?History  ? ?Fall ? ? ?HPI ? ?Andrew Pruitt is a 66 y.o. male with history of obesity, elevated BMI, hypertension, hyperlipidemia, diabetes who comes in for concern for a fall.  I reviewed the hospital admission from 12/2021 where he was admitted for a STEMI and went to the Cath Lab and had a PCI.  Patient was loaded with Brilinta.  According to family patient was discharged today and around 4 PM when he was trying to get into the house he reports his legs gave out and he fell directly onto his abdomen and to his left-sided chest.  He denies any chest pain or shortness of breath prior to the fall just that his legs gave out.  He then sat on the recliner for a few hours and when he tried to stand up again and he stated that because of the pain he was too weak to move and laid down again on the ground.  Both of these episodes were witnessed by the wife who is adamant that he did not hit his head did not lose consciousness, denies any neck pain.  They do state that ? ?Patient had successful PCI to the LAD ? ? ?Physical Exam  ? ?Triage Vital Signs: ?ED Triage Vitals  ?Enc Vitals Group  ?   BP 12/21/21 2027 115/71  ?   Pulse Rate 12/21/21 2027 79  ?   Resp 12/21/21 2027 18  ?   Temp 12/21/21 2027 98.7 ?F (37.1 ?C)  ?   Temp Source 12/21/21 2027 Oral  ?   SpO2 12/21/21 2027 94 %  ?   Weight --   ?   Height --   ?   Head Circumference --   ?   Peak Flow --   ?   Pain Score 12/21/21 2031 10  ?   Pain Loc --   ?   Pain Edu? --   ?   Excl. in [place]? --   ? ? ?Most recent vital signs: ?Vitals:  ? 12/21/21 2027  ?BP: 115/71  ?Pulse: 79  ?Resp: 18  ?Temp: 98.7 ?F (37.1 ?C)  ?SpO2: 94%  ? ? ? ?General: Awake, no distress.  ?CV:  Good peripheral perfusion.  ?Resp:  Normal effort.  ?Abd:  No distention.  Bruising and abrasions noted over the abdomen of the left chest wall with  significant tenderness ?Other:   ? ? ?ED Results / Procedures / Treatments  ? ?Labs ?(all labs ordered are listed, but only abnormal results are displayed) ?Labs Reviewed  ?CBC WITH DIFFERENTIAL/PLATELET - Abnormal; Notable for the following components:  ?    Result Value  ? WBC 15.8 (*)   ? Neutro Abs 12.6 (*)   ? Monocytes Absolute 1.6 (*)   ? All other components within normal limits  ?COMPREHENSIVE METABOLIC PANEL - Abnormal; Notable for the following components:  ? Sodium 133 (*)   ? Chloride 95 (*)   ? Glucose, Bld 224 (*)   ? BUN 45 (*)   ? Creatinine, Ser 2.19 (*)   ? AST 91 (*)   ? ALT 68 (*)   ? Total Bilirubin 2.0 (*)   ? GFR, Estimated 32 (*)   ? All other components within normal limits  ?TROPONIN I (HIGH SENSITIVITY) - Abnormal; Notable for the following components:  ?  Troponin I (High Sensitivity) >24,000 (*)   ? All other components within normal limits  ?LIPASE, BLOOD  ?TROPONIN I (HIGH SENSITIVITY)  ? ? ? ?EKG ? ?My interpretation of EKG: ? ?Sinus rate of 81 with some ST elevation in the inferior leads and a little bit of ST elevation in V4 V5, normal intervals  ? ?Repeat EKG  is stable sinus rate with similar ST elevation without any T wave inversions and normal intervals. ? ?RADIOLOGY ?I have reviewed the xray with no PNA or rib fracture ? ?CT pending ? ? ?PROCEDURES: ? ?Critical Care performed: Yes, see critical care procedure note(s) ? ?.1-3 Lead EKG Interpretation ?Performed by: Vanessa Gibsonton, MD ?Authorized by: Vanessa [place], MD  ? ?  Interpretation: normal   ?  ECG rate:  70 ?  ECG rate assessment: normal   ?  Rhythm: sinus rhythm   ?  Ectopy: none   ?  Conduction: normal   ?.Critical Care ?Performed by: Vanessa Ruch, MD ?Authorized by: Vanessa Warrenton, MD  ? ?Critical care provider statement:  ?  Critical care time (minutes):  30 ?  Critical care was time spent personally by me on the following activities:  Development of treatment plan with patient or surrogate, discussions with  consultants, evaluation of patient's response to treatment, examination of patient, ordering and review of laboratory studies, ordering and review of radiographic studies, ordering and performing treatments and interventions, pulse oximetry, re-evaluation of patient's condition and review of old charts ? ? ?MEDICATIONS ORDERED IN ED: ?Medications  ?HYDROmorphone (DILAUDID) injection 0.5 mg (0.5 mg Intravenous Given 12/21/21 2123)  ?ondansetron [facility]) injection 4 mg (4 mg Intravenous Given 12/21/21 2122)  ?HYDROmorphone (DILAUDID) injection 0.5 mg (0.5 mg Intravenous Given 12/21/21 2324)  ? ? ? ?IMPRESSION / MDM / ASSESSMENT AND PLAN / ED COURSE  ?I reviewed the triage vital signs and the nursing notes. ?             ?               ? ?Patient comes in with a fall after his legs gave out on him after trying to get out out of the car.  Patient has bruising noted and significant pain along his chest and his abdomen.  I suspect patient has rib fractures.  Low suspicion for acute trauma of the abdomen but given getting the CT scan of the chest will get one of the abdomen as well.  He has an EKG concerning for a STEMI but he denies any chest pain, shortness of breath, syncopal episode so I suspect this is just residual from his recent cardiac catheterization.  However I will discuss with the on-call STEMI doctor to further discuss.  Patient is adamant that he did not hit his head and has no C-spine tenderness.  We discussed CT imaging of these but he states that the fall was witnessed by his wife he is got no hematoma noted no headache and is adamant that he did not hit his head so we will hold off.  He reports he fell directly on his left side of his chest. ? ? ?Discussed the case with Dr. Saralyn Pilar and the EKG does look pretty similar to his second EKG when he came in with a STEMI given the clinical picture we will hold off on catheterization right now ? ?Patient troponin was greater than 25,000 which is difficult to  interpret given when he was in the hospital 2  days ago he went up to 9000 but never had a repeat troponin shows that it was downtrending.  This could just be residual from the recent STEMI.  Given the clinical picture I again discussed the case with Dr. Christa See shows who agrees with holding off on catheterization given patient has no chest pain other than the pain that resulted from the fall and the rib fractures.  We will hold off on heparinization due to the risk given all these rib fractures of bleeding. ? ?I discussed with family and updated them on this and they felt comfortable with this plan.  Patient given some IV Dilaudid to help with pain.  Will admit to the hospital team for further work-up and monitoring ? ? ?The patient is on the cardiac monitor to evaluate for evidence of arrhythmia and/or significant heart rate changes. ? ? ? ? ?FINAL CLINICAL IMPRESSION(S) / ED DIAGNOSES  ? ?Final diagnoses:  ?Fall, initial encounter  ?Closed fracture of multiple ribs of left side, initial encounter  ? ? ? ?Rx / DC Orders  ? ?ED Discharge Orders   ? ? None  ? ?  ? ? ? ?Note:  This document was prepared using Dragon voice recognition software and may include unintentional dictation errors. ?  ?Vanessa [place], MD ?12/21/21 2356 ? ?

## 2021-12-21 NOTE — Progress Notes (Incomplete)
? ?Chronic Care Management ?Pharmacy Note ? ?12/21/2021 ?Name:  Andrew Pruitt MRN:  [id number] DOB:  1956/04/05 ? ?Summary: ?12/19/21-STEMI ? ? ?Subjective: ?Andrew Pruitt is an 66 y.o. year old male who is a primary patient of Jon Billings, NP.  The CCM team was consulted for assistance with disease management and care coordination needs.   ? ?Engaged with patient by telephone for follow up visit in response to provider referral for pharmacy case management and/or care coordination services.  ? ?Consent to Services:  ?The patient was given information about Chronic Care Management services, agreed to services, and gave verbal consent prior to initiation of services.  Please see initial visit note for detailed documentation.  ? ?Patient Care Team: ?Jon Billings, NP as PCP - General (Nurse Practitioner) ?Nelva Bush, MD as PCP - Cardiology (Cardiology) ?Vanita Ingles, RN as Registered Nurse (General Practice) ?Madelin Rear, [facility] as Pharmacist (Pharmacist) ? ?Hospital visits: ?None in previous 6 months ? ?Objective: ? ?Lab Results  ?Component Value Date  ? CREATININE 2.08 (H) 12/21/2021  ? CREATININE 1.92 (H) 12/20/2021  ? CREATININE 2.09 (H) 12/19/2021  ? ? ?Lab Results  ?Component Value Date  ? HGBA1C 11.0 (H) 12/20/2021  ? HGBA1C 11.6 (A) 10/06/2021  ? HGBA1C 10.9 (H) 08/01/2021  ? ?Last diabetic Eye exam:  ?Lab Results  ?Component Value Date/Time  ? HMDIABEYEEXA Retinopathy (A) 01/17/2021 12:00 AM  ?  ?Last diabetic Foot exam: No results found for: HMDIABFOOTEX  ? ?   ?Component Value Date/Time  ? CHOL 239 (H) 12/19/2021 2054  ? CHOL 229 (H) 08/01/2021 0944  ? CHOL 220 (H) 05/01/2021 0930  ? CHOL 200 (H) 01/24/2021 1030  ? TRIG 711 (H) 12/19/2021 2054  ? TRIG 574 ([place]) 08/01/2021 0944  ? TRIG 633 (HH) 05/01/2021 0930  ? HDL 26 (L) 12/19/2021 2054  ? HDL 24 (L) 08/01/2021 0944  ? HDL 24 (L) 05/01/2021 0930  ? HDL 21 (L) 01/24/2021 1030  ? CHOLHDL 9.2 12/19/2021 2054  ? VLDL UNABLE TO CALCULATE IF  TRIGLYCERIDE OVER 400 mg/dL 12/19/2021 2054  ? LDLCALC UNABLE TO CALCULATE IF TRIGLYCERIDE OVER 400 mg/dL 12/19/2021 2054  ? [place] 106 (H) 08/01/2021 0944  ? LDLCALC 91 05/01/2021 0930  ? LDLCALC Comment (A) 01/24/2021 1030  ? LDLDIRECT 97.3 12/19/2021 2054  ? LDLDIRECT 81.0 02/11/2020 1037  ? ? ? ?  Latest Ref Rng & Units 12/19/2021  ?  8:54 PM 08/01/2021  ?  9:44 AM 05/01/2021  ?  9:30 AM  ?Hepatic Function  ?Total Protein 6.5 - 8.1 g/dL 7.2   6.6   7.2    ?Albumin 3.5 - 5.0 g/dL 3.6   4.4   4.4    ?AST 15 - 41 U/L '28   31   27    ' ?ALT 0 - 44 U/L 53   104   57    ?Alk Phosphatase 38 - 126 U/L 52   69   69    ?Total Bilirubin 0.3 - 1.2 mg/dL 0.7   0.5   0.5    ? ? ?Lab Results  ?Component Value Date/Time  ? TSH 2.95 02/11/2020 10:37 AM  ? ? ? ?  Latest Ref Rng & Units 12/20/2021  ?  2:07 AM 12/19/2021  ?  8:54 PM 04/05/2021  ?  4:56 AM  ?CBC  ?WBC 4.0 - 10.5 K/uL 10.6   7.3   5.6    ?Hemoglobin 13.0 - 17.0 g/dL 16.3   15.6  16.1    ?Hematocrit 39.0 - 52.0 % 47.5   45.3   44.9    ?Platelets 150 - 400 K/uL 318   306   305    ? ? ?No results found for: VD25OH ? ?Clinical ASCVD:  ?The ASCVD Risk score (Arnett DK, et al., 2019) failed to calculate for the following reasons: ?  The patient has a prior MI or stroke diagnosis   ?Social History  ? ?Tobacco Use  ?Smoking Status Never  ?Smokeless Tobacco Never  ? ?BP Readings from Last 3 Encounters:  ?12/21/21 138/78  ?12/18/21 123/71  ?10/09/21 113/73  ? ?Pulse Readings from Last 3 Encounters:  ?12/21/21 99  ?12/18/21 84  ?10/09/21 83  ? ?Wt Readings from Last 3 Encounters:  ?12/19/21 284 lb 2.8 oz (128.9 kg)  ?12/18/21 283 lb 9.6 oz (128.6 kg)  ?10/09/21 284 lb 3.2 oz (128.9 kg)  ? ?BMI Readings from Last 3 Encounters:  ?12/19/21 38.54 kg/m?  ?12/18/21 40.69 kg/m?  ?10/09/21 40.78 kg/m?  ? ? ?Assessment: Review of patient past medical history, allergies, medications, health status, including review of consultants reports, laboratory and other test data, was performed as part  of comprehensive evaluation and provision of chronic care management services.  ? ?SDOH:  (Social Determinants of Health) assessments and interventions performed:  ? ? ?CCM Care Plan ? ?Allergies  ?Allergen Reactions  ? Niaspan [Niacin] Other (See Comments)  ?  Red skin, red eyes  ? Trazodone Other (See Comments)  ?  nightmares  ? ? ?Medications Reviewed Today   ? ? Reviewed by Wynelle Cleveland, [place] (Pharmacist) on 12/20/21 at 1342  Med List Status: Complete  ? ?Medication Order Taking? Sig Documenting Provider Last Dose Status Informant  ?amLODipine (NORVASC) 5 MG tablet [id number] Yes TAKE 1 TABLET BY MOUTH DAILY. FURTHER REFILLS WILL NEED TO COME FROM NEW PRIMARY CARE PROVIDER  ?Patient taking differently: Take 5 mg by mouth daily.  ? Jon Billings, NP Past Week Active   ?atorvastatin (LIPITOR) 80 MG tablet [id number] Yes Take 1 tablet (80 mg total) by mouth daily. Jon Billings, NP Past Week Active   ?B-D UF III MINI PEN NEEDLES 31G X 5 MM MISC [id number] Yes USE DAILY Renato Shin, MD Past Week Active   ?Continuous Blood Gluc Receiver (FREESTYLE LIBRE 14 DAY READER) DEVI [id number] Yes Use to check glucose levels TID; E11.9 Brunetta Jeans, PA-C Past Week Active   ?Continuous Blood Gluc Sensor (FREESTYLE LIBRE 14 DAY SENSOR) [place] [id number] Yes Use to check glucose TID. Change every 14 days. E 11.9 Brunetta Jeans, [place] Past Week Active   ?dapagliflozin propanediol (FARXIGA) 10 MG TABS tablet [id number] Yes Take 1 tablet (10 mg total) by mouth daily before breakfast. Jon Billings, NP Past Week Active   ?diclofenac Sodium (VOLTAREN) 1 % GEL [id number] No Apply 2 g topically 4 (four) times daily.  ?Patient not taking: Reported on 12/20/2021  ? Jon Billings, NP Not Taking Active   ?fenofibrate (TRICOR) 145 MG tablet [id number] Yes TAKE 1 TABLET BY MOUTH EVERY DAY  ?Patient taking differently: Take 145 mg by mouth daily.  ? Jon Billings, NP Past Week Active   ?gabapentin (NEURONTIN) 100 MG  capsule [id number] Yes Take 1 capsule (100 mg total) by mouth 3 (three) times daily. Jon Billings, NP Past Week Active   ?hydrochlorothiazide (HYDRODIURIL) 25 MG tablet [id number] Yes TAKE 1 TABLET BY MOUTH EVERY DAY  ?Patient taking differently: Take 25 mg by mouth daily.  ? Mathis Dad,  Santiago Glad, NP Past Week Active   ?insulin NPH Human (NOVOLIN N) 100 UNIT/ML injection [id number] Yes Inject 2 mLs (200 Units total) into the skin every morning. Renato Shin, MD Past Week Active   ?venlafaxine XR (EFFEXOR XR) 75 MG 24 hr capsule [id number] Yes Take 1 capsule (75 mg total) by mouth daily with breakfast. Jon Billings, NP Past Week Active   ? ?  ?  ? ?  ? ? ?Patient Active Problem List  ? Diagnosis Date Noted  ? Obesity (BMI 30-39.9) 12/20/2021  ? Morbid obesity ([place]) 12/20/2021  ? STEMI (ST elevation myocardial infarction) ([place]) 12/19/2021  ? STEMI involving left anterior descending coronary artery ([place]) 12/19/2021  ? Poorly controlled diabetes mellitus ([place]) 12/19/2021  ? Diabetic ulcer of left foot ([place]) 12/18/2021  ? Depression, recurrent ([place])   ? Nuclear sclerotic cataract of both eyes 09/07/2020  ? [place] retinal tear, right eye 08/11/2020  ? Viral warts 06/07/2020  ? Diabetic macular edema of right eye with proliferative retinopathy associated with type 2 diabetes mellitus ([place]) 04/20/2020  ? Proliferative diabetic retinopathy of right eye ([place]) 03/02/2020  ? Type 2 diabetes mellitus with proliferative diabetic retinopathy of left eye without macular edema ([place]) 03/02/2020  ? Diabetic neuropathy associated with type 2 diabetes mellitus ([place]) 02/22/2020  ? Type 2 diabetes mellitus ([place]) 02/22/2020  ? Hypertension associated with diabetes ([place]) 02/22/2020  ? Hyperlipidemia associated with type 2 diabetes mellitus ([place]) 02/22/2020  ? Prostate cancer screening 02/22/2020  ? Plantar warts 02/22/2020  ? ? ?Immunization History  ?Administered Date(s) Administered  ? Fluad Quad(high Dose 65+) 06/24/2020,  08/01/2021  ? Moderna Sars-Covid-2 Vaccination 10/13/2019, 11/09/2019, 08/18/2020  ? Pension scheme manager 24yr & up 07/17/2021  ? Pneumococcal Conjugate-13 03/25/2020  ? Pneumococcal Polysaccharide

## 2021-12-21 NOTE — ED Notes (Addendum)
Pt to ED for fall that occurred around 4 pm this afternoon after falling onto a wood structure. Pt landed on his abdomen and L arm. Pt c/o abdominal pain from fall, denies LOC, hitting head. Pt had MI on Tuesday and stent placed- inserted through R Wrist. Pt denies any CP, SOB. Denies N/V. Denies blurry vision and dizziness.  ?Pt is on blood thinner  ?Pt is A&OX4.  ?

## 2021-12-21 NOTE — H&P (Addendum)
?History and Physical  ? ? ?Patient: Andrew Pruitt QAS:[id number] DOB: [birth year] ?DOA: 12/21/2021 ?DOS: the patient was seen and examined on 12/22/2021 ?PCP: Jon Billings, NP  ?Patient coming from: Home ? ?Chief Complaint:  ?Chief Complaint  ?Patient presents with  ? Fall  ? ?HPI: Andrew Pruitt is a 66 y.o. male with medical history significant of STEMI 3 days ago was d/c yesterday and on way home out of car fell in driveway his knees gave out per pt.  Pt fell on left side and hurt his chest. He fell on piece of wood and has scrapes to his left elbow . Did not pass out was not dizzy and is not dizzy, did not hit his head.  ?Pt currently does not report any headache, vision issues speech incontinence chest pain shortness of breath palpitations abdominal pain or any other complaints. ?Per wife pt has been stated on 5 new meds for his heart and diabetes. ?Initial vitals in the emergency room showed that patient's blood pressure was : ? ? ?Since arrival patient has had respiratory rate of 21 maximum and leukocytosis I do not have a lactic acid level to determine if patient meets sepsis criteria but certainly meets SIRS criteria right now.And although pt has mild lft elevation and mild AKI on ckd I dont suspect this x organ dysfunction rather orthostatic perfusion related abnormalities. I also dont suspect an infection. Will get procalcitonin as he has aspiration risk with his OSA/ sleep disordered breathing. I ordered a lactic acid level if it is elevated we will empirically cover patient for sepsis . ?as patient is postprocedural and immunocompromised as he is poorly controlled diabetic with A1c of 11. ?There is also less likely risk of cholecystitis or choledocholithiasis to be cause of infection.  ? ?CT of chest / abd and pelvis shows: ?1. Minimally displaced left fifth through eighth rib fractures, ?nondisplaced left anterior ninth rib fracture and fracture through ?the anterior tenth rib chondral cartilage. ?2.  No pneumothorax or pulmonary contusion. ?3. No evidence of acute traumatic injury to the abdomen or pelvis. ?4. Incidental findings in the abdomen and pelvis of hepatic ?steatosis and cholelithiasis. ?I suspect rib fractures causing leucocytosis.  ? ?Review of Systems: Review of Systems  ?All other systems reviewed and are negative. ? ?Past Medical History:  ?Diagnosis Date  ? Arthritis   ? Depression   ? Diabetes mellitus without complication ([place])   ? Hyperlipidemia   ? Hypertension   ? ?Past Surgical History:  ?Procedure Laterality Date  ? CATARACT EXTRACTION Right   ? CATARACT EXTRACTION Left   ? CORONARY/GRAFT ACUTE MI REVASCULARIZATION N/A 12/19/2021  ? Procedure: Coronary/Graft Acute MI Revascularization;  Surgeon: Nelva Bush, MD;  Location: [facility];  Service: Cardiovascular;  Laterality: N/A;  ? LEFT HEART CATH AND CORONARY ANGIOGRAPHY N/A 12/19/2021  ? Procedure: LEFT HEART CATH AND CORONARY ANGIOGRAPHY;  Surgeon: Nelva Bush, MD;  Location: [facility];  Service: Cardiovascular;  Laterality: N/A;  ? TONSILLECTOMY    ? ?Social History:  reports that he has never smoked. He has never used smokeless tobacco. He reports that he does not currently use alcohol. He reports that he does not use drugs. ? ?Allergies  ?Allergen Reactions  ? Niaspan [Niacin] Other (See Comments)  ?  Red skin, red eyes  ? Trazodone Other (See Comments)  ?  nightmares  ? ? ?Family History  ?Problem Relation Age of Onset  ? Cancer Mother   ?  Uterine   ? Early death Mother   ? Stroke Mother   ? Diabetes Father   ? Heart attack Father   ? Heart disease Father   ? Hyperlipidemia Father   ? Diabetes Brother   ? Alzheimer's disease Maternal Grandmother   ? Kidney failure Maternal Grandfather   ? Diabetes Paternal Grandfather   ? Diabetes Brother   ? Diabetes Brother   ? Colon cancer Neg Hx   ? Pancreatic cancer Neg Hx   ? Esophageal cancer Neg Hx   ? ? ?Prior to Admission medications   ?Medication Sig Start  Date End Date Taking? Authorizing Provider  ?aspirin 81 MG chewable tablet Chew 1 tablet (81 mg total) by mouth daily. 12/22/21 01/21/22  Max Sane, MD  ?atorvastatin (LIPITOR) 80 MG tablet Take 1 tablet (80 mg total) by mouth daily. 01/25/21   Jon Billings, NP  ?B-D UF III MINI PEN NEEDLES 31G X 5 MM MISC USE DAILY 07/19/20   Renato Shin, MD  ?carvedilol (COREG) 3.125 MG tablet Take 1 tablet (3.125 mg total) by mouth 2 (two) times daily with a meal. 12/21/21 01/20/22  Max Sane, MD  ?Continuous Blood Gluc Receiver (FREESTYLE LIBRE 14 DAY READER) [place] Use to check glucose levels TID; E11.9 02/17/20   Brunetta Jeans, PA-C  ?Continuous Blood Gluc Sensor (FREESTYLE LIBRE 14 DAY SENSOR) MISC Use to check glucose TID. Change every 14 days. E 11.9 02/17/20   Brunetta Jeans, PA-C  ?dapagliflozin propanediol (FARXIGA) 10 MG TABS tablet Take 1 tablet (10 mg total) by mouth daily before breakfast. 03/28/21   Jon Billings, NP  ?fenofibrate 160 MG tablet Take 1 tablet (160 mg total) by mouth daily. 12/22/21 01/21/22  Max Sane, MD  ?gabapentin (NEURONTIN) 100 MG capsule Take 1 capsule (100 mg total) by mouth 3 (three) times daily. 12/21/20   Jon Billings, NP  ?insulin aspart (NOVOLOG) 100 UNIT/ML injection Inject 4 Units into the skin 3 (three) times daily with meals. 12/21/21   Max Sane, MD  ?insulin detemir (LEVEMIR) 100 UNIT/ML FlexPen Inject 20 Units into the skin 2 (two) times daily. 12/21/21 01/20/22  Max Sane, MD  ?losartan (COZAAR) 25 MG tablet Take 1 tablet (25 mg total) by mouth daily. 12/22/21 01/21/22  Max Sane, MD  ?ticagrelor (BRILINTA) 90 MG TABS tablet Take 1 tablet (90 mg total) by mouth 2 (two) times daily. 12/21/21 01/20/22  Max Sane, MD  ?venlafaxine XR (EFFEXOR XR) 75 MG 24 hr capsule Take 1 capsule (75 mg total) by mouth daily with breakfast. 10/09/21   Jon Billings, NP  ? ? ?Physical Exam: ?Vitals:  ? 12/21/21 2357 12/21/21 2358 12/22/21 0001 12/22/21 0039  ?BP:   (!) 136/91 134/86   ?Pulse: 79 81 81 71  ?Resp: '18 15 20 '$ (!) 21  ?Temp:      ?TempSrc:      ?SpO2: (!) 88% 90% 95% 97%  ?Physical Exam ?Vitals and nursing note reviewed.  ?Constitutional:   ?   General: He is not in acute distress. ?   Appearance: Normal appearance. He is not ill-appearing, toxic-appearing or diaphoretic.  ?HENT:  ?   Head: Normocephalic and atraumatic.  ?   Right Ear: Hearing and external ear normal.  ?   Left Ear: Hearing and external ear normal.  ?   Nose: Nose normal. No nasal deformity.  ?   Mouth/Throat:  ?   Lips: Pink.  ?   Mouth: Mucous membranes are moist.  ?  Tongue: No lesions.  ?   Pharynx: Oropharynx is clear.  ?Eyes:  ?   Extraocular Movements: Extraocular movements intact.  ?   Pupils: Pupils are equal, round, and reactive to light.  ?Neck:  ?   Vascular: No carotid bruit.  ?Cardiovascular:  ?   Rate and Rhythm: Normal rate and regular rhythm.  ?   Pulses:     ?     Dorsalis pedis pulses are 0 on the right side and 0 on the left side.  ?     Posterior tibial pulses are 0 on the right side and 0 on the left side.  ?   Heart sounds: Normal heart sounds.  ?Pulmonary:  ?   Effort: Pulmonary effort is normal.  ?   Breath sounds: Normal breath sounds.  ?Abdominal:  ?   General: Bowel sounds are normal. There is no distension.  ?   Palpations: Abdomen is soft. There is no mass.  ?   Tenderness: There is no abdominal tenderness. There is no guarding.  ?   Hernia: No hernia is present.  ?Musculoskeletal:  ?   Right lower leg: No edema.  ?   Left lower leg: No edema.  ?Skin: ?   General: Skin is warm.  ?Neurological:  ?   General: No focal deficit present.  ?   Mental Status: He is alert and oriented to person, place, and time.  ?   Cranial Nerves: Cranial nerves 2-12 are intact.  ?   Motor: Motor function is intact.  ?Psychiatric:     ?   Attention and Perception: Attention normal.     ?   Mood and Affect: Mood normal.     ?   Speech: Speech normal.     ?   Behavior: Behavior normal. Behavior is cooperative.      ?   Cognition and Memory: Cognition normal.  ? ? ?Data Reviewed: ?Results for orders placed or performed during the hospital encounter of 12/21/21 (from the past 24 hour(s))  ?CBC with Differenti

## 2021-12-21 NOTE — Progress Notes (Signed)
Inpatient Diabetes Program Recommendations ? ?AACE/ADA: New Consensus Statement on Inpatient Glycemic Control (2015) ? ?Target Ranges:  Prepandial:   less than 140 mg/dL ?     Peak postprandial:   less than 180 mg/dL (1-2 hours) ?     Critically ill patients:  140 - 180 mg/dL  ? ?Lab Results  ?Component Value Date  ? GLUCAP 241 (H) 12/21/2021  ? HGBA1C 11.0 (H) 12/20/2021  ? ? ?Review of Glycemic Control ? ?Diabetes history: DM2 ?Outpatient Diabetes medications: NPH 200 units qd, Farxiga 10 mg qd, ?Current orders for Inpatient glycemic control: Levemir 15 bid, Novolog 4 units tid meal coverage, Farxiga 10 mg qd, Novolog 0-20 units tid, 0-5 units hs ? ?Inpatient Diabetes Program Recommendations:   ?-Increase Levemir to 20 units bid ? ?Spoke with patient's wife via phone regarding insulin doses. Patient has been taking prescribed NPH 200 units daily and has been having hypoglycemic events especially during hs going to 45-50 which patient treated with drinking juice or eating.patient also gained 30-40 lbs after starting on NPH bid regimen. ? ?Patient was not able to afford Trulicity ($8250 per month) and wife attempted to get help through the companies for her and his medications since they are on Medicare and a supplement. ?Patient and wife are also moving to [place] to be near family at the end of this month so encouraged wife to go ahead and locate an endocrinologist there to follow patient and wife. ? ?Due to patient having hypoglycemia on current regimen, please consider on discharge home to transition to Lantus qd or Levemir bid. Patient eats mostly meat and "white things". Patient and wife have already discussed need for patient to decrease carbohydrates. States patient does not eat very much @ one time and does not have specific eating times since retired. ? ?Secure chat to [place] with updated information. ? ?Thank you, ?Nani Gasser Glenard Keesling, RN, MSN, CDE  ?Diabetes Coordinator ?Inpatient Glycemic Control Team ?Team  Pager [phone] (8am-5pm) ?12/21/2021 10:36 AM ? ? ? ? ?

## 2021-12-21 NOTE — Progress Notes (Signed)
?Subjective:  ?Patient ID: Andrew Pruitt, male    DOB: 1956-04-29,  MRN: [id number] ? ?Chief Complaint  ?Patient presents with  ? Diabetic Ulcer  ? ? ?66 y.o. male presents for wound care.  Patient presents with new complaint of left great toe and second digit interdigital space ulceration.  Patient states been present for quite some time is progressive gotten worse.  He was treating it at home for few months but has since not seen a specialist for it.  He has not done much for it there is some odor to it he denies seeing anyone else prior to seeing me.  He would like to discuss treatment options for these.  He is diabetic with last A1c of 11%. ? ? ?Review of Systems: Negative except as noted in the HPI. Denies N/V/F/Ch. ? ?Past Medical History:  ?Diagnosis Date  ? Arthritis   ? Depression   ? Diabetes mellitus without complication ([place])   ? Hyperlipidemia   ? Hypertension   ? ?No current facility-administered medications for this visit. ? ?Current Outpatient Medications:  ?  [START ON 12/22/2021] aspirin 81 MG chewable tablet, Chew 1 tablet (81 mg total) by mouth daily., Disp: 30 tablet, Rfl: 0 ?  carvedilol (COREG) 3.125 MG tablet, Take 1 tablet (3.125 mg total) by mouth 2 (two) times daily with a meal., Disp: 60 tablet, Rfl: 0 ?  [START ON 12/22/2021] fenofibrate 160 MG tablet, Take 1 tablet (160 mg total) by mouth daily., Disp: 30 tablet, Rfl: 0 ?  insulin aspart (NOVOLOG) 100 UNIT/ML injection, Inject 4 Units into the skin 3 (three) times daily with meals., Disp: 10 mL, Rfl: 11 ?  insulin detemir (LEVEMIR) 100 UNIT/ML FlexPen, Inject 20 Units into the skin 2 (two) times daily., Disp: 12 mL, Rfl: 0 ?  [START ON 12/22/2021] losartan (COZAAR) 25 MG tablet, Take 1 tablet (25 mg total) by mouth daily., Disp: 30 tablet, Rfl: 0 ?  ticagrelor (BRILINTA) 90 MG TABS tablet, Take 1 tablet (90 mg total) by mouth 2 (two) times daily., Disp: 60 tablet, Rfl: 0 ? ?Facility-Administered Medications Ordered in Other Visits:  ?  0.9  %  sodium chloride infusion, 250 mL, Intravenous, PRN, Cox, Amy N, DO ?  acetaminophen (TYLENOL) tablet 650 mg, 650 mg, Oral, Q4H PRN, Cox, Amy N, DO ?  ascorbic acid (VITAMIN C) tablet 250 mg, 250 mg, Oral, BID, Manuella Ghazi, Vipul, MD, 250 mg at 12/21/21 1046 ?  aspirin chewable tablet 81 mg, 81 mg, Oral, Daily, Cox, Amy N, DO, 81 mg at 12/21/21 1047 ?  atorvastatin (LIPITOR) tablet 80 mg, 80 mg, Oral, Daily, Cox, Amy N, DO, 80 mg at 12/21/21 1047 ?  carvedilol (COREG) tablet 3.125 mg, 3.125 mg, Oral, BID WC, Theora Gianotti, NP, 3.125 mg at 12/21/21 0851 ?  Chlorhexidine Gluconate Cloth 2 % PADS 6 each, 6 each, Topical, Q0600, Cox, Amy N, DO, 6 each at 12/21/21 8338 ?  dapagliflozin propanediol (FARXIGA) tablet 10 mg, 10 mg, Oral, QAC breakfast, Cox, Amy N, DO, 10 mg at 12/21/21 0850 ?  fenofibrate tablet 160 mg, 160 mg, Oral, Daily, Cox, Amy N, DO, 160 mg at 12/21/21 1049 ?  gabapentin (NEURONTIN) capsule 100 mg, 100 mg, Oral, TID, Cox, Amy N, DO, 100 mg at 12/21/21 1045 ?  heparin injection 5,000 Units, 5,000 Units, Subcutaneous, Q8H, Cox, Amy N, DO, 5,000 Units at 12/20/21 2102 ?  hydrALAZINE (APRESOLINE) injection 5 mg, 5 mg, Intravenous, Q6H PRN, Cox, Amy N, DO ?  insulin aspart (novoLOG) injection 0-20 Units, 0-20 Units, Subcutaneous, TID WC, Cox, Amy N, DO, 7 Units at 12/21/21 1155 ?  insulin aspart (novoLOG) injection 0-5 Units, 0-5 Units, Subcutaneous, QHS, Cox, Amy N, DO, 5 Units at 12/20/21 0005 ?  insulin aspart (novoLOG) injection 4 Units, 4 Units, Subcutaneous, TID WC, Max Sane, MD, 4 Units at 12/21/21 1156 ?  insulin detemir (LEVEMIR) injection 15 Units, 15 Units, Subcutaneous, BID, Max Sane, MD, 15 Units at 12/21/21 1048 ?  losartan (COZAAR) tablet 25 mg, 25 mg, Oral, Daily, Theora Gianotti, NP, 25 mg at 12/21/21 1047 ?  melatonin tablet 5 mg, 5 mg, Oral, QHS PRN, Cox, Amy N, DO ?  multivitamin with minerals tablet 1 tablet, 1 tablet, Oral, Daily, Max Sane, MD, 1 tablet at  12/21/21 1047 ?  nitroGLYCERIN (NITROSTAT) SL tablet 0.4 mg, 0.4 mg, Sublingual, Q5 min PRN, Cox, Amy N, DO, 0.4 mg at 12/19/21 2101 ?  ondansetron (ZOFRAN) injection 4 mg, 4 mg, Intravenous, Q6H PRN, Cox, Amy N, DO, 4 mg at 12/20/21 1707 ?  polyethylene glycol (MIRALAX / GLYCOLAX) packet 17 g, 17 g, Oral, Daily PRN, Cox, Amy N, DO ?  protein supplement (ENSURE MAX) liquid, 11 oz, Oral, BID, Max Sane, MD, 11 oz at 12/20/21 2057 ?  sodium chloride flush (NS) 0.9 % injection 3 mL, 3 mL, Intravenous, Q12H, Cox, Amy N, DO, 3 mL at 12/21/21 1055 ?  sodium chloride flush (NS) 0.9 % injection 3 mL, 3 mL, Intravenous, PRN, Cox, Amy N, DO, 3 mL at 12/21/21 1054 ?  ticagrelor (BRILINTA) tablet 90 mg, 90 mg, Oral, BID, Cox, Amy N, DO, 90 mg at 12/21/21 1046 ?  venlafaxine XR (EFFEXOR-XR) 24 hr capsule 75 mg, 75 mg, Oral, Q breakfast, Cox, Amy N, DO, 75 mg at 12/21/21 0851 ? ?Social History  ? ?Tobacco Use  ?Smoking Status Never  ?Smokeless Tobacco Never  ? ? ?Allergies  ?Allergen Reactions  ? Niaspan [Niacin] Other (See Comments)  ?  Red skin, red eyes  ? Trazodone Other (See Comments)  ?  nightmares  ? ?Objective:  ?There were no vitals filed for this visit. ?There is no height or weight on file to calculate BMI. ?Constitutional Well developed. ?Well nourished.  ?Vascular Dorsalis pedis pulses nonpalpable bilaterally. ?Posterior tibial pulses nonpalpable bilaterally. ?Capillary refill diminished to all digits ?No cyanosis or clubbing noted. ?Pedal hair growth normal.  ?Neurologic Normal speech. ?Oriented to person, place, and time. ?Protective sensation absent  ?Dermatologic Wound Location: Left first and second digit interdigital ulceration probing down to deep tissue not bone.  No purulent drainage noted mild malodor present. ?Wound Base: Mixed Granular/Fibrotic ?Peri-wound: Macerated ?Exudate: None: wound tissue dry, Scant/small amount Serosanguinous exudate ?Wound Measurements: ?-See below  ?Orthopedic: No pain to  palpation either foot.  ? ?Radiographs: None ?Assessment:  ? ?1. Vascular abnormality   ?2. Diabetic neuropathy with neurologic complication ([place])   ?3. Foot ulceration, left, with fat layer exposed ([place])   ? ?Plan:  ?Patient was evaluated and treated and all questions answered. ? ?Ulcer left first and second digit ulceration with fat layer exposed ?-Debridement as below. ?-Dressed with Betadine wet-to-dry, DSD. ?-Continue off-loading with surgical shoe. ?-Doxycycline was sent to the pharmacy for skin and soft tissue prophylaxis ?-Patient is a high risk of losing the foot given that his A1c is 11% with questionable vascular flow. ? ?Vascular abnormality ?-ABIs PVRs were ordered to assess the vascular flow ? ?Procedure: Excisional Debridement of Wound ?Tool: Hervey Ard  chisel blade/tissue nipper ?Rationale: Removal of non-viable soft tissue from the wound to promote healing.  ?Anesthesia: none ?Pre-Debridement Wound Measurements: 0.7 cm x 0.3 cm x 0.3 cm  ?Post-Debridement Wound Measurements: 0.8 cm x 0.4 cm x 0.3 cm  ?Type of Debridement: Sharp Excisional ?Tissue Removed: Non-viable soft tissue ?Blood loss: Minimal (<50cc) ?Depth of Debridement: subcutaneous tissue. ?Technique: Sharp excisional debridement to bleeding, viable wound base.  ?Wound Progress: This is my initial evaluation I will continue to monitor the progression of the wound ?Site healing conversation 7 ?Dressing: Dry, sterile, compression dressing. ?Disposition: Patient tolerated procedure well. Patient to return in 1 week for follow-up. ? ?No follow-ups on file. ? ?  ?

## 2021-12-21 NOTE — ED Triage Notes (Addendum)
Pt presents via POV with complaints of a fall - he notes falling onto a piece of wood on his left side; scrapes to his left elbow. He notes being admitted 2 days ago for a cardiac emergency and had a stent placed and was on blood thinners while admitted and has not started them at home. He states that he was taking a few steps and his legs gave out from under him. Denies LOC or hitting his head.  ?

## 2021-12-21 NOTE — Telephone Encounter (Signed)
-----   Message from Theora Gianotti, NP sent at 12/21/2021  9:22 AM EDT ----- ?Regarding: f/u ?Good morning, ? ?Would you pls arrange for a 7 day toc for this guy, w/ me next week? ? ?Thanks, ? ?Gerald Stabs ? ?

## 2021-12-21 NOTE — Progress Notes (Signed)
?  Called to review [place] for possible inferior STEMI. Patient with anterior STEMI 12/19/2021, ECG showed ST elevation V3-V5, with nondiagnostic ST elevation inferior leads, cath showed thrombotic stenosis LAD, occuled mid LPDA, 80% stenosis nondominant RCA, underwent DES LAD, went home today. Patient fell at home, returns today to ER with blunt injury to chest, pelvis, ECG looks similar to previous ECG with less ST elevation anteriorly and persistent nondiagnostic ST elevation inferior leads. Very unlikely new inferior STEMI especially with known nondominant RCA and chest discomfort more consistent chest wall injury. ?

## 2021-12-21 NOTE — Progress Notes (Signed)
?  Transition of Care (TOC) Screening Note ? ? ?Patient Details  ?Name: Andrew Pruitt ?Date of Birth: 1955-11-28 ? ? ?Transition of Care (TOC) CM/SW Contact:    ?Alberteen Sam, LCSW ?Phone Number: ?12/21/2021, 1:18 PM ? ? ? ?Transition of Care Department [facility]) has reviewed patient and no TOC needs have been identified at this time. We will continue to monitor patient advancement through interdisciplinary progression rounds. If new patient transition needs arise, please place a TOC consult. ? Pricilla Riffle, Guy ?[phone] ? ?

## 2021-12-21 NOTE — Telephone Encounter (Signed)
TCM.... ? ?Patient is being discharged but still admitted ? ?They saw Sharolyn Douglas ? ?They are scheduled to see [place] 4-21 at 1055  ? ?They were seen for STEMI ? ?They need to be seen within 7 days ? ?  ? ?Please call  ? ?

## 2021-12-21 NOTE — Progress Notes (Signed)
?   12/21/21 0700  ?Clinical Encounter Type  ?Visited With Patient  ?Visit Type Follow-up  ?Referral From Chaplain  ?Consult/Referral To Chaplain  ? ?Chaplain followed up per on call Chaplain. Chaplain provided compassionate presence and prayer. ?

## 2021-12-21 NOTE — Telephone Encounter (Signed)
Patient still currently admitted. ?Will need to review for 1st TCM attempt on 12/22/21.  ? ?

## 2021-12-22 ENCOUNTER — Observation Stay: Payer: Medicare Other

## 2021-12-22 ENCOUNTER — Telehealth: Payer: Self-pay | Admitting: Nurse Practitioner

## 2021-12-22 ENCOUNTER — Telehealth: Payer: Medicare Other

## 2021-12-22 ENCOUNTER — Other Ambulatory Visit: Payer: Self-pay

## 2021-12-22 ENCOUNTER — Observation Stay (HOSPITAL_COMMUNITY)
Admit: 2021-12-22 | Discharge: 2021-12-22 | Disposition: A | Discharge status: Home / Self Care | Payer: Medicare Other | Attending: Nurse Practitioner | Admitting: Nurse Practitioner

## 2021-12-22 ENCOUNTER — Encounter: Payer: Self-pay | Admitting: Internal Medicine

## 2021-12-22 DIAGNOSIS — E871 Hypo-osmolality and hyponatremia: Secondary | ICD-10-CM | POA: Diagnosis present

## 2021-12-22 DIAGNOSIS — R0989 Other specified symptoms and signs involving the circulatory and respiratory systems: Secondary | ICD-10-CM | POA: Diagnosis present

## 2021-12-22 DIAGNOSIS — I2102 ST elevation (STEMI) myocardial infarction involving left anterior descending coronary artery: Secondary | ICD-10-CM

## 2021-12-22 DIAGNOSIS — W19XXXA Unspecified fall, initial encounter: Secondary | ICD-10-CM | POA: Diagnosis present

## 2021-12-22 DIAGNOSIS — I130 Hypertensive heart and chronic kidney disease with heart failure and stage 1 through stage 4 chronic kidney disease, or unspecified chronic kidney disease: Secondary | ICD-10-CM | POA: Diagnosis present

## 2021-12-22 DIAGNOSIS — R7989 Other specified abnormal findings of blood chemistry: Secondary | ICD-10-CM | POA: Diagnosis present

## 2021-12-22 DIAGNOSIS — I959 Hypotension, unspecified: Secondary | ICD-10-CM | POA: Diagnosis present

## 2021-12-22 DIAGNOSIS — I77810 Thoracic aortic ectasia: Secondary | ICD-10-CM | POA: Diagnosis present

## 2021-12-22 DIAGNOSIS — E113592 Type 2 diabetes mellitus with proliferative diabetic retinopathy without macular edema, left eye: Secondary | ICD-10-CM | POA: Diagnosis present

## 2021-12-22 DIAGNOSIS — I6389 Other cerebral infarction: Secondary | ICD-10-CM

## 2021-12-22 DIAGNOSIS — N1832 Chronic kidney disease, stage 3b: Secondary | ICD-10-CM | POA: Diagnosis present

## 2021-12-22 DIAGNOSIS — I639 Cerebral infarction, unspecified: Secondary | ICD-10-CM | POA: Diagnosis not present

## 2021-12-22 DIAGNOSIS — R6510 Systemic inflammatory response syndrome (SIRS) of non-infectious origin without acute organ dysfunction: Secondary | ICD-10-CM | POA: Diagnosis present

## 2021-12-22 DIAGNOSIS — L97529 Non-pressure chronic ulcer of other part of left foot with unspecified severity: Secondary | ICD-10-CM | POA: Diagnosis present

## 2021-12-22 DIAGNOSIS — Z955 Presence of coronary angioplasty implant and graft: Secondary | ICD-10-CM | POA: Diagnosis not present

## 2021-12-22 DIAGNOSIS — Z20822 Contact with and (suspected) exposure to covid-19: Secondary | ICD-10-CM | POA: Diagnosis present

## 2021-12-22 DIAGNOSIS — I63511 Cerebral infarction due to unspecified occlusion or stenosis of right middle cerebral artery: Secondary | ICD-10-CM | POA: Diagnosis present

## 2021-12-22 DIAGNOSIS — D849 Immunodeficiency, unspecified: Secondary | ICD-10-CM | POA: Diagnosis present

## 2021-12-22 DIAGNOSIS — E1165 Type 2 diabetes mellitus with hyperglycemia: Secondary | ICD-10-CM | POA: Diagnosis present

## 2021-12-22 DIAGNOSIS — W1830XA Fall on same level, unspecified, initial encounter: Secondary | ICD-10-CM | POA: Diagnosis present

## 2021-12-22 DIAGNOSIS — I2510 Atherosclerotic heart disease of native coronary artery without angina pectoris: Secondary | ICD-10-CM | POA: Diagnosis present

## 2021-12-22 DIAGNOSIS — G4733 Obstructive sleep apnea (adult) (pediatric): Secondary | ICD-10-CM | POA: Diagnosis present

## 2021-12-22 DIAGNOSIS — I25118 Atherosclerotic heart disease of native coronary artery with other forms of angina pectoris: Secondary | ICD-10-CM

## 2021-12-22 DIAGNOSIS — R778 Other specified abnormalities of plasma proteins: Secondary | ICD-10-CM | POA: Diagnosis not present

## 2021-12-22 DIAGNOSIS — N179 Acute kidney failure, unspecified: Secondary | ICD-10-CM | POA: Diagnosis present

## 2021-12-22 DIAGNOSIS — E11621 Type 2 diabetes mellitus with foot ulcer: Secondary | ICD-10-CM | POA: Diagnosis present

## 2021-12-22 DIAGNOSIS — Y92008 Other place in unspecified non-institutional (private) residence as the place of occurrence of the external cause: Secondary | ICD-10-CM | POA: Diagnosis not present

## 2021-12-22 DIAGNOSIS — I5042 Chronic combined systolic (congestive) and diastolic (congestive) heart failure: Secondary | ICD-10-CM | POA: Diagnosis present

## 2021-12-22 DIAGNOSIS — S2242XA Multiple fractures of ribs, left side, initial encounter for closed fracture: Secondary | ICD-10-CM | POA: Diagnosis present

## 2021-12-22 DIAGNOSIS — E1122 Type 2 diabetes mellitus with diabetic chronic kidney disease: Secondary | ICD-10-CM | POA: Diagnosis present

## 2021-12-22 DIAGNOSIS — F32A Depression, unspecified: Secondary | ICD-10-CM | POA: Diagnosis present

## 2021-12-22 DIAGNOSIS — E1169 Type 2 diabetes mellitus with other specified complication: Secondary | ICD-10-CM | POA: Diagnosis present

## 2021-12-22 DIAGNOSIS — E785 Hyperlipidemia, unspecified: Secondary | ICD-10-CM | POA: Diagnosis present

## 2021-12-22 DIAGNOSIS — S2249XA Multiple fractures of ribs, unspecified side, initial encounter for closed fracture: Secondary | ICD-10-CM | POA: Diagnosis present

## 2021-12-22 DIAGNOSIS — Z888 Allergy status to other drugs, medicaments and biological substances status: Secondary | ICD-10-CM | POA: Diagnosis not present

## 2021-12-22 LAB — CBG MONITORING, ED
Glucose-Capillary: 227 mg/dL — ABNORMAL HIGH (ref 70–99)
Glucose-Capillary: 220 mg/dL — ABNORMAL HIGH (ref 70–99)
Glucose-Capillary: 199 mg/dL — ABNORMAL HIGH (ref 70–99)

## 2021-12-22 LAB — COMPREHENSIVE METABOLIC PANEL
ALT: 65 U/L — ABNORMAL HIGH (ref 0–44)
AST: 69 U/L — ABNORMAL HIGH (ref 15–41)
Albumin: 3.6 g/dL (ref 3.5–5.0)
Alkaline Phosphatase: 51 U/L (ref 38–126)
Anion gap: 14 (ref 5–15)
BUN: 54 mg/dL — ABNORMAL HIGH (ref 8–23)
CO2: 25 mmol/L (ref 22–32)
Calcium: 8.9 mg/dL (ref 8.9–10.3)
Chloride: 95 mmol/L — ABNORMAL LOW (ref 98–111)
Creatinine, Ser: 2.21 mg/dL — ABNORMAL HIGH (ref 0.61–1.24)
GFR, Estimated: 32 mL/min — ABNORMAL LOW (ref >60)
Glucose, Bld: 229 mg/dL — ABNORMAL HIGH (ref 70–99)
Potassium: 3.9 mmol/L (ref 3.5–5.1)
Sodium: 134 mmol/L — ABNORMAL LOW (ref 135–145)
Total Bilirubin: 2 mg/dL — ABNORMAL HIGH (ref 0.3–1.2)
Total Protein: 7.5 g/dL (ref 6.5–8.1)

## 2021-12-22 LAB — GLUCOSE, CAPILLARY
Glucose-Capillary: 151 mg/dL — ABNORMAL HIGH (ref 70–99)
Glucose-Capillary: 126 mg/dL — ABNORMAL HIGH (ref 70–99)

## 2021-12-22 LAB — ECHOCARDIOGRAM LIMITED BUBBLE STUDY: S' Lateral: 2.96 cm

## 2021-12-22 LAB — CK: Total CK: 556 U/L — ABNORMAL HIGH (ref 49–397)

## 2021-12-22 LAB — LACTIC ACID, PLASMA: Lactic Acid, Venous: 2.9 mmol/L (ref 0.5–1.9)

## 2021-12-22 IMAGING — MR MR MRA HEAD W/O CM
1 series · 27 of 48 positions shown · non-contrast
Comparison: MRI head from the same day. Carotid ultrasound from the
same day.

CLINICAL DATA: Neuro deficit, acute, stroke suspected

EXAM:
MRA NECK WITHOUT CONTRAST
MRA HEAD WITHOUT CONTRAST
TECHNIQUE: Angiographic images of the Circle of Willis were acquired using MRA
technique without intravenous contrast.

[Series 5: TOF · axial · 0.5mm · 0.48mm/px · z∈[-74,+19]mm · 27 of 217 slices shown]
[im 1/217]
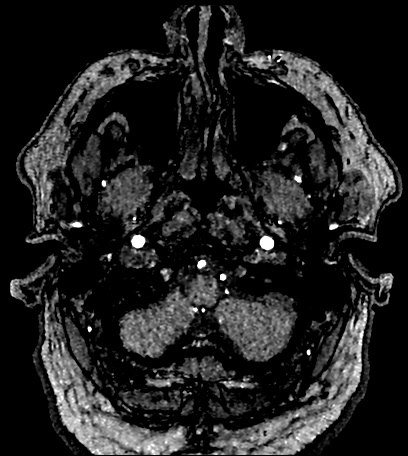
[im 5/217]
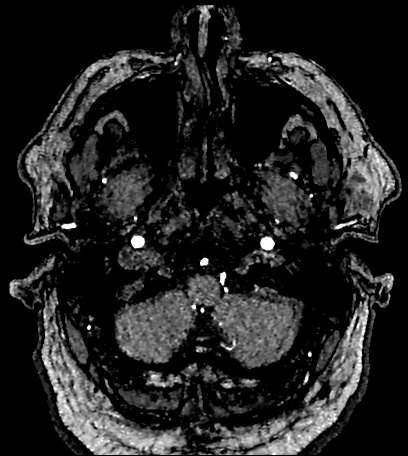
[im 10/217]
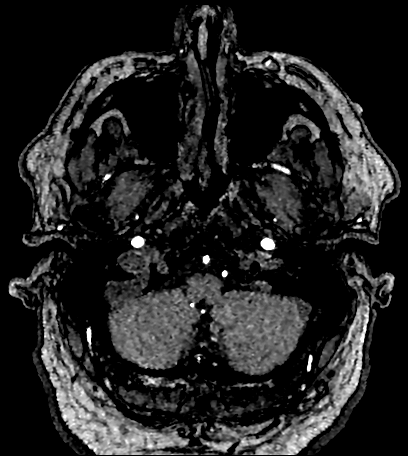
[im 14/217]
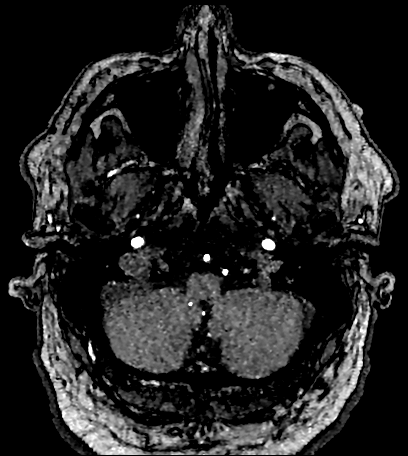
[im 19/217]
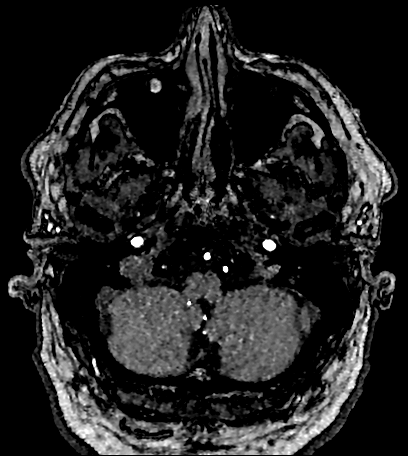
[im 23/217]
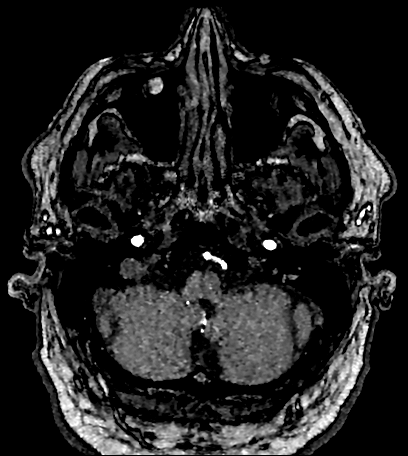
[im 28/217]
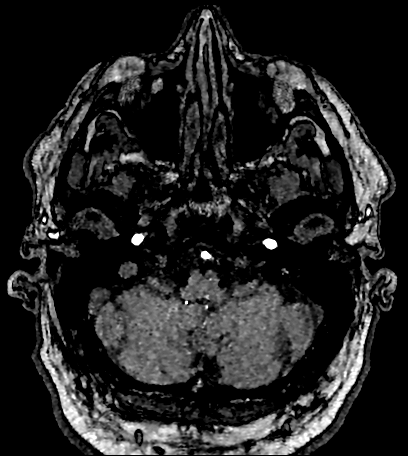
[im 33/217]
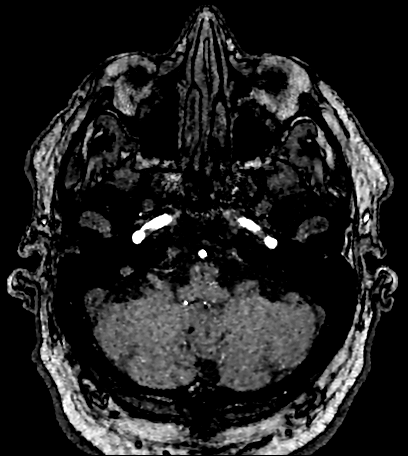
[im 37/217]
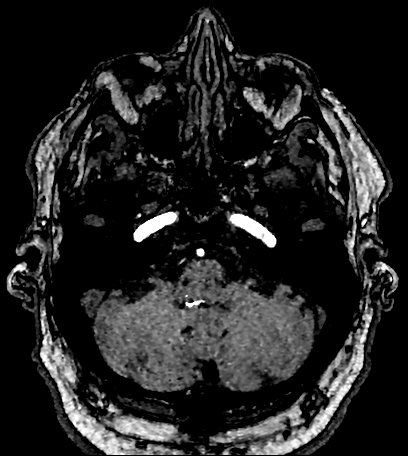
[im 42/217]
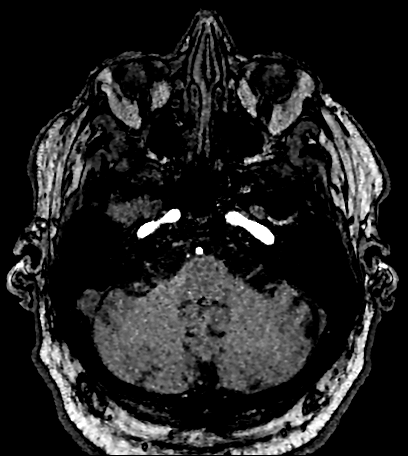
[im 46/217]
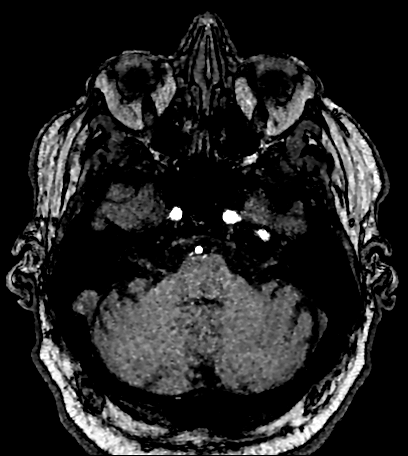
[im 51/217]
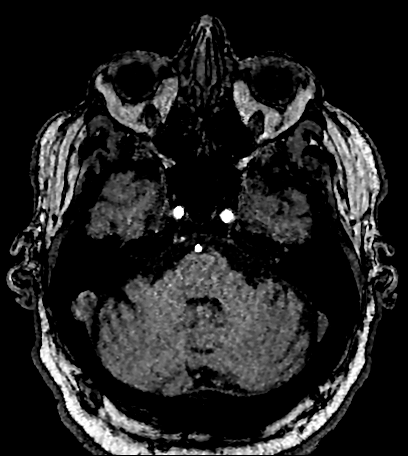
[im 56/217]
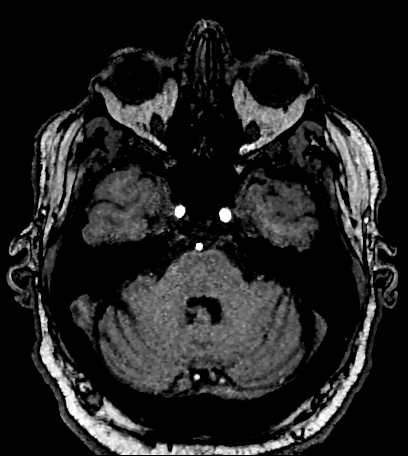
[im 60/217]
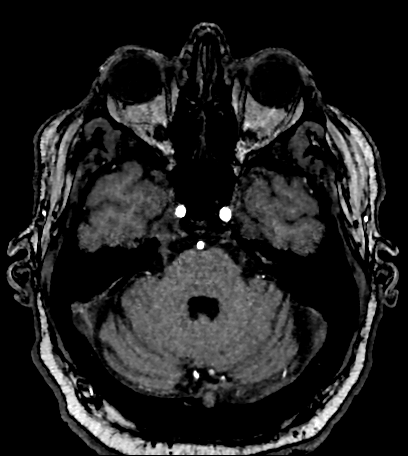
[im 65/217]
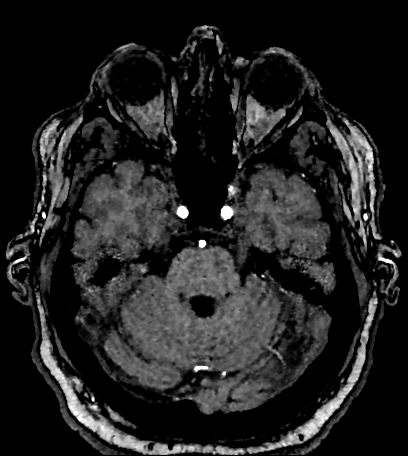
[im 69/217]
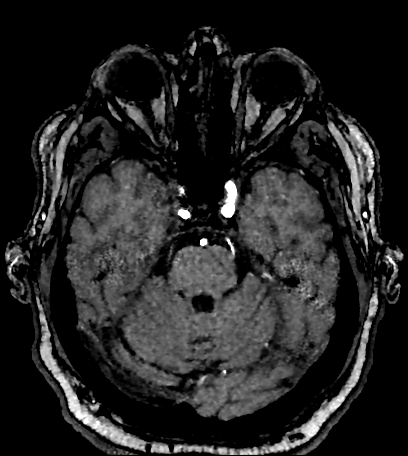
[im 74/217]
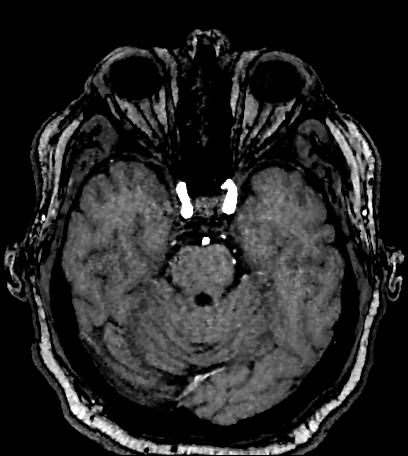
[im 79/217]
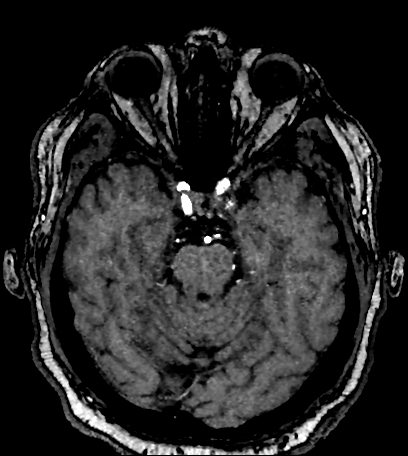
[im 83/217]
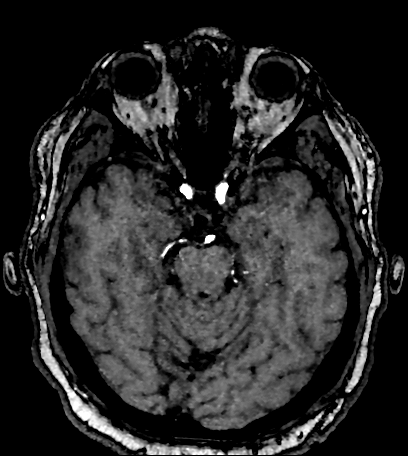
[im 88/217]
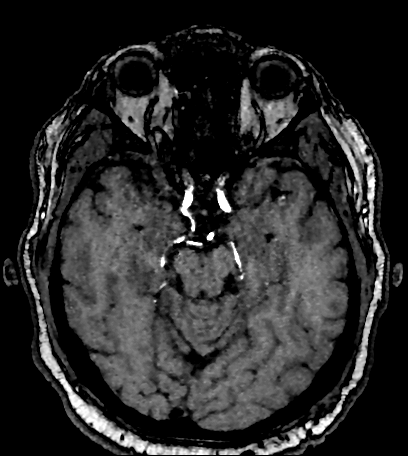
[im 97/217]
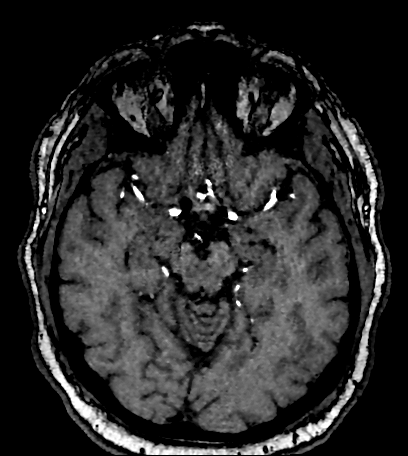
[im 111/217]
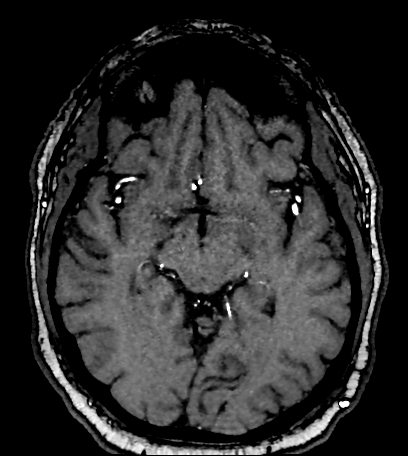
[im 125/217]
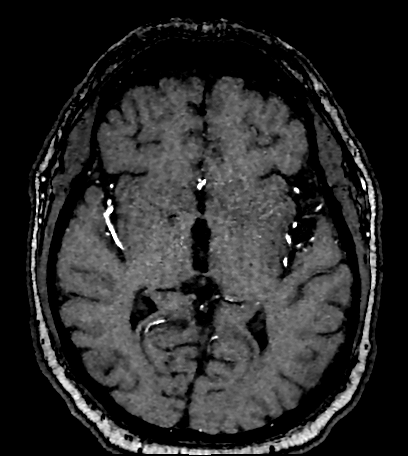
[im 152/217]
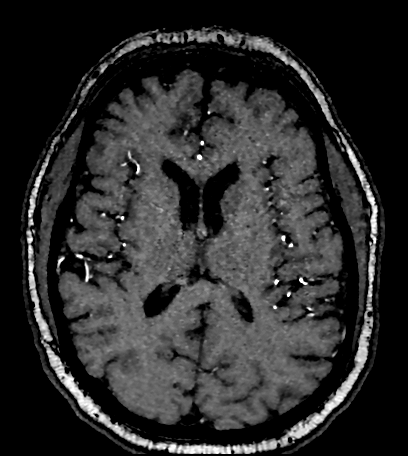
[im 180/217]
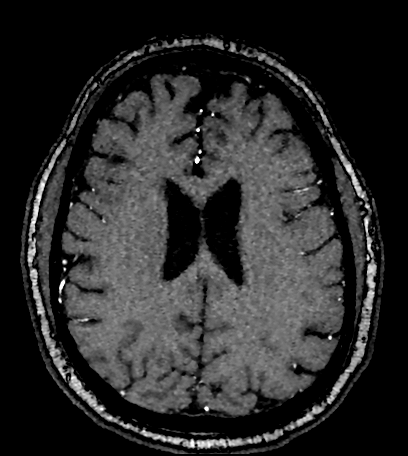
[im 184/217]
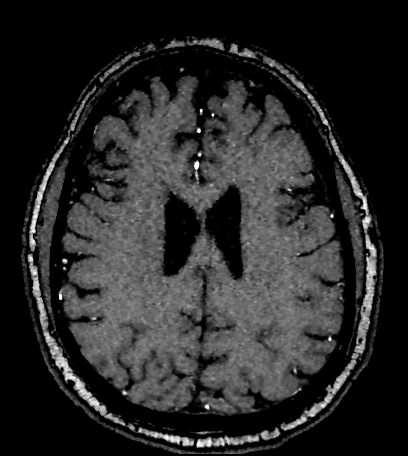
[im 207/217]
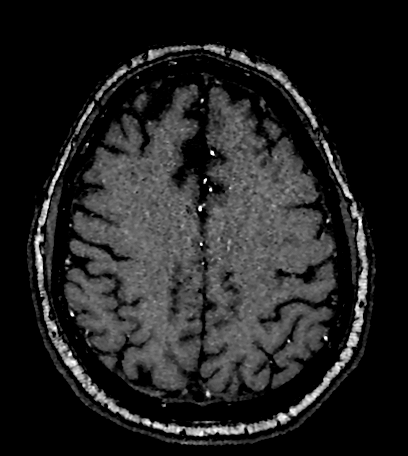

[27 of 48 positions shown; findings below may reference images not displayed]

FINDINGS: MRA NECK FINDINGS

Artifact limits evaluation, particularly proximally. Within this
limitation, no visible high-grade stenosis of either the carotid
systems or vertebral arteries. Right vertebral artery is dominant.
Also, in plane loss of flow related signal limits evaluation of the
V3 vertebral arteries and proximal left vertebral artery.

MRA HEAD FINDINGS

Anterior circulation: Bilateral intracranial ICAs, MCAs, and ACAs
are patent. Probably severe proximal right M2 MCA stenosis per no
aneurysm identified.

Posterior circulation: Bilateral intradural vertebral arteries,
basilar artery, and posterior cerebral arteries are patent without
proximal hemodynamically significant stenosis. Right posterior
communicating artery is present. No aneurysm identified.
IMPRESSION: MRA head:

1. No emergent large vessel occlusion.
2. Probably severe proximal right M2 MCA stenosis.

MRA neck:

Limited by artifact without visible high-grade stenosis.

## 2021-12-22 IMAGING — US US CAROTID DUPLEX BILAT
1 series · 13 of 24 positions shown · non-contrast
Comparison: None.

CLINICAL DATA: 65-year-old male with a history of stroke-like
symptoms

EXAM:
BILATERAL CAROTID DUPLEX ULTRASOUND
TECHNIQUE: Gray scale imaging, color Doppler and duplex ultrasound were
performed of bilateral carotid and vertebral arteries in the neck.

[Series 1: us carotid bilateral · 13 of 68 slices shown]
[im 1/68]
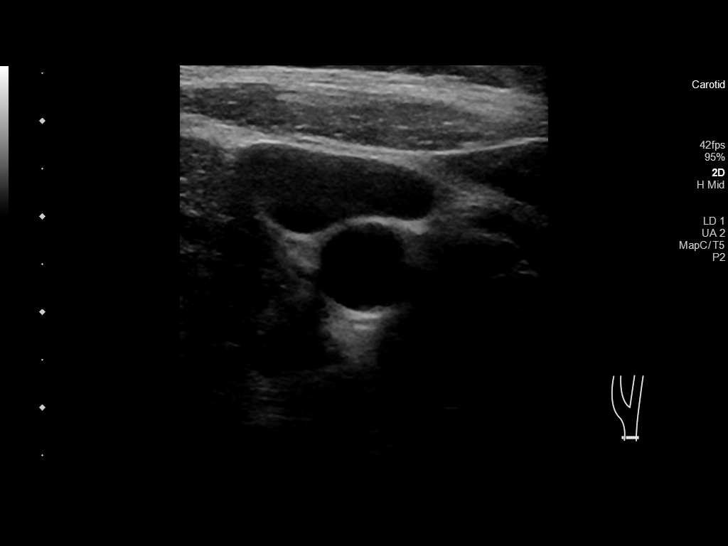
[im 6/68]
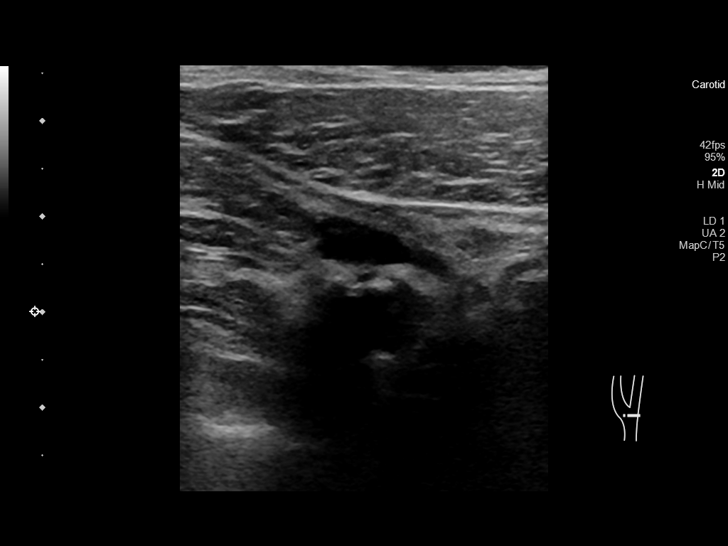
[im 12/68]
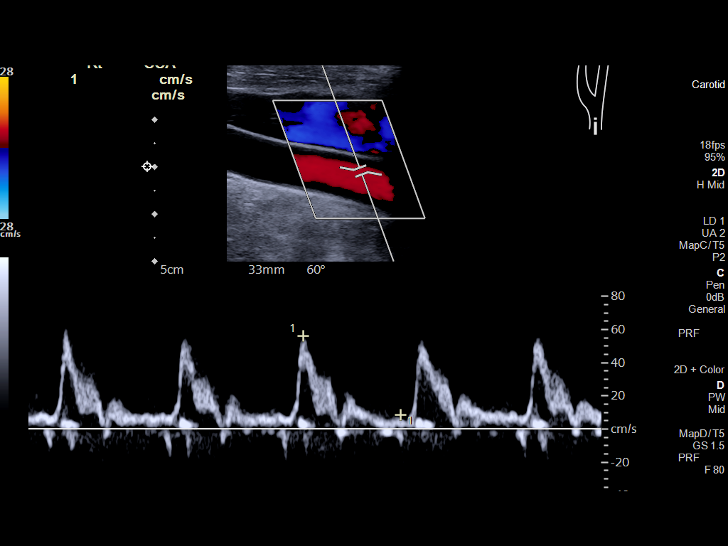
[im 18/68]
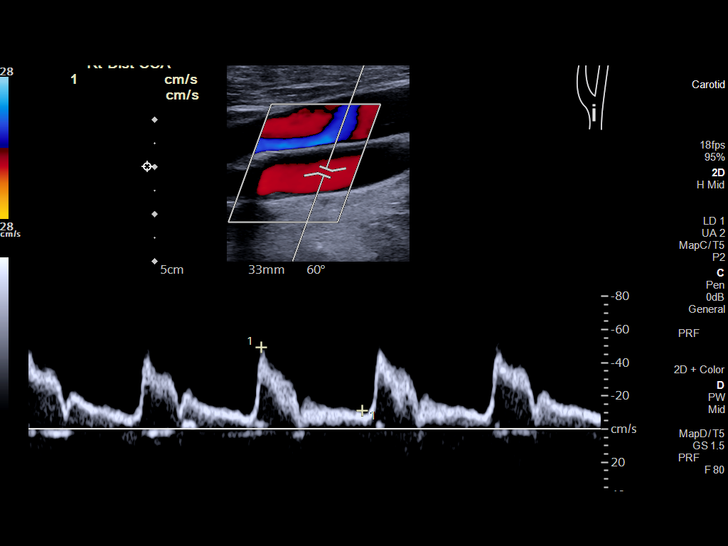
[im 24/68]
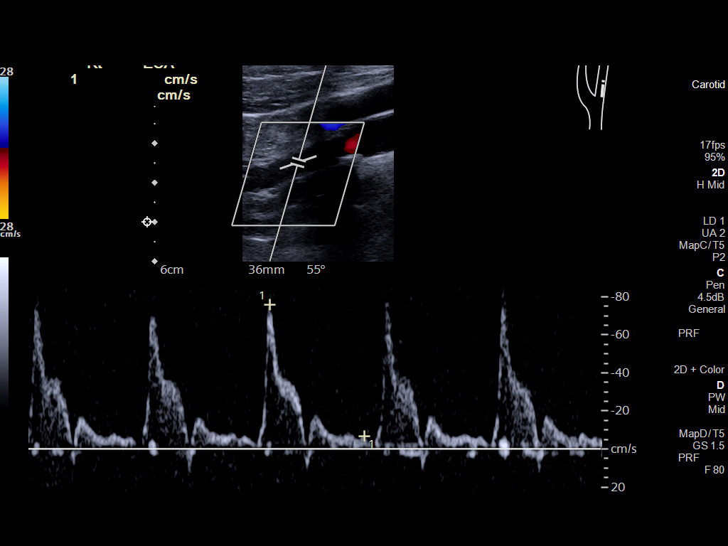
[im 30/68]
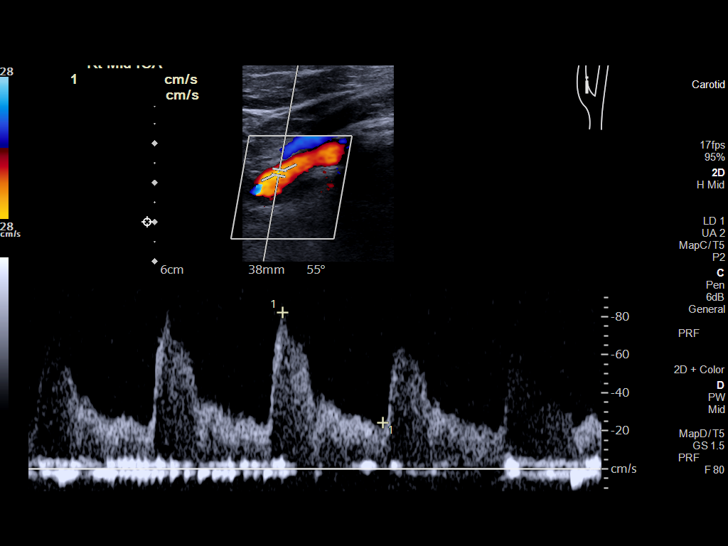
[im 35/68]
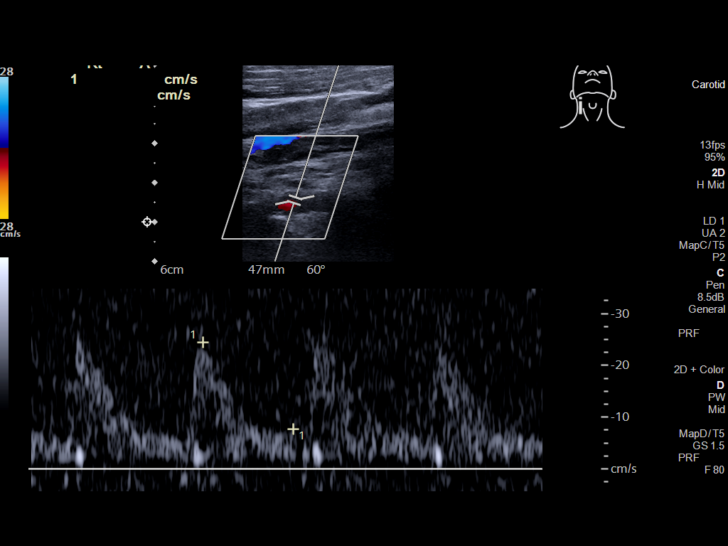
[im 38/68]
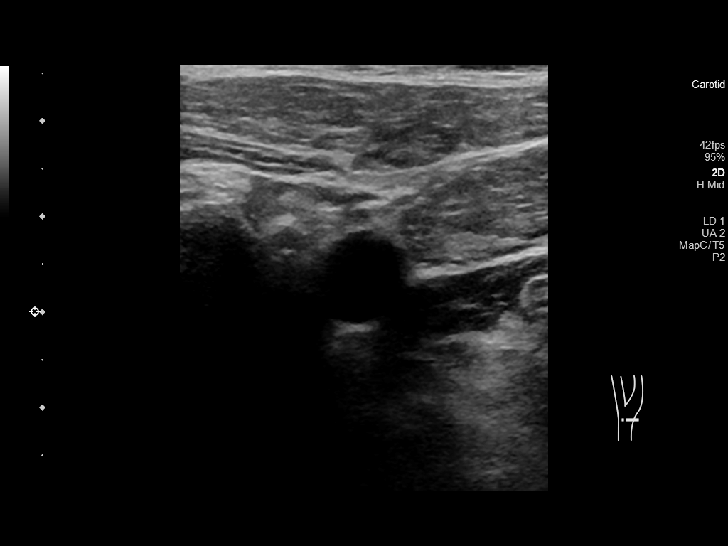
[im 44/68]
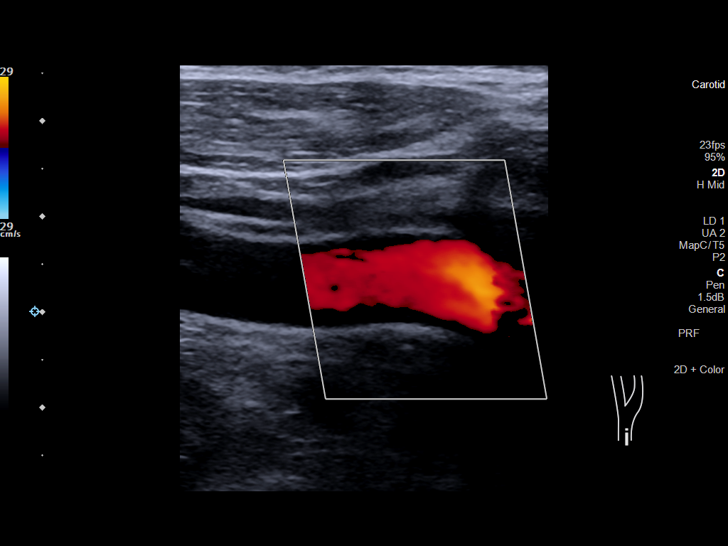
[im 50/68]
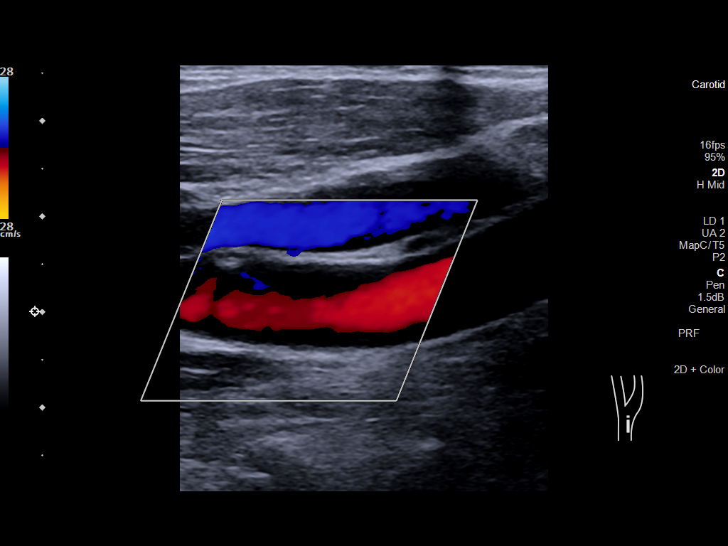
[im 56/68]
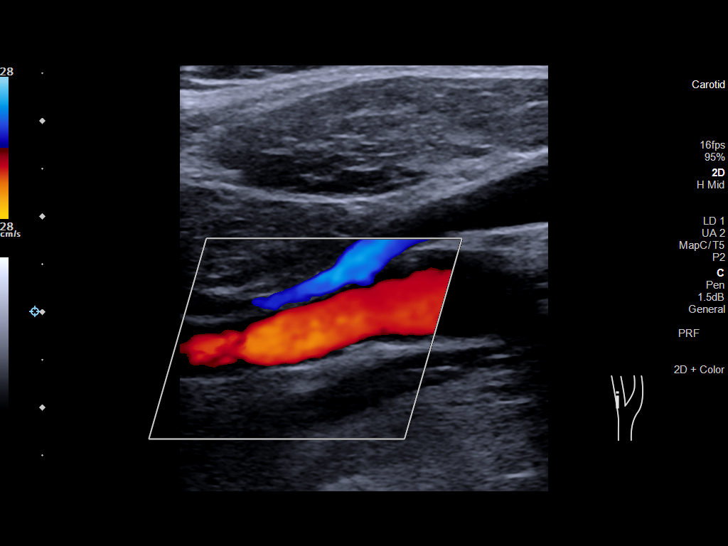
[im 62/68]
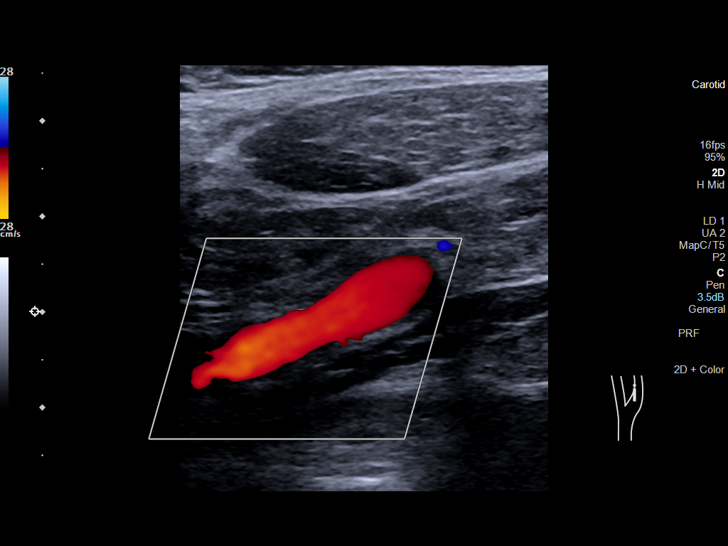
[im 68/68]
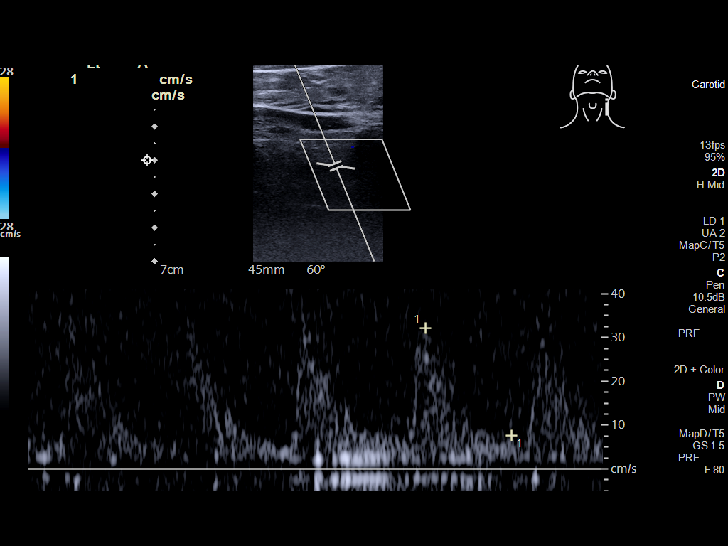

[13 of 24 positions shown; findings below may reference images not displayed]

FINDINGS: Criteria: Quantification of carotid stenosis is based on velocity
parameters that correlate the residual internal carotid diameter
with NASCET-based stenosis levels, using the diameter of the distal
internal carotid lumen as the denominator for stenosis measurement.

The following velocity measurements were obtained:

RIGHT

ICA:  Systolic 82 cm/sec, Diastolic 24 cm/sec

CCA:  54 cm/sec

SYSTOLIC ICA/CCA RATIO:

ECA:  76 cm/sec

LEFT

ICA:  Systolic 60 cm/sec, Diastolic 23 cm/sec

CCA:  62 cm/sec

SYSTOLIC ICA/CCA RATIO:

ECA:  73 cm/sec

Right Brachial SBP: Not acquired

Left Brachial SBP: Not acquired

RIGHT CAROTID ARTERY: No significant calcifications of the right
common carotid artery. Intermediate waveform maintained.
Heterogeneous and partially calcified plaque at the right carotid
bifurcation. No significant lumen shadowing. Low resistance waveform
of the right ICA. No significant tortuosity.

RIGHT VERTEBRAL ARTERY: Antegrade flow with low resistance waveform.

LEFT CAROTID ARTERY: No significant calcifications of the left
common carotid artery. Intermediate waveform maintained.
Heterogeneous and partially calcified plaque at the left carotid
bifurcation without significant lumen shadowing. Low resistance
waveform of the left ICA. No significant tortuosity.

LEFT VERTEBRAL ARTERY:  Antegrade flow with low resistance waveform.
IMPRESSION: Color duplex indicates minimal heterogeneous and calcified plaque,
with no hemodynamically significant stenosis by duplex criteria in
the extracranial cerebrovascular circulation.

## 2021-12-22 IMAGING — MR MR MRA NECK W/O CM
1 series · 43 of 48 positions shown · non-contrast
Comparison: MRI head from the same day. Carotid ultrasound from the
same day.

CLINICAL DATA: Neuro deficit, acute, stroke suspected

EXAM:
MRA NECK WITHOUT CONTRAST
MRA HEAD WITHOUT CONTRAST
TECHNIQUE: Angiographic images of the Circle of Willis were acquired using MRA
technique without intravenous contrast.

[Series 6: TOF post-contrast · axial · non-contrast · 0.8mm · 0.52mm/px · z∈[-325,-65]mm · 43 of 343 slices shown]
[im 1/343]
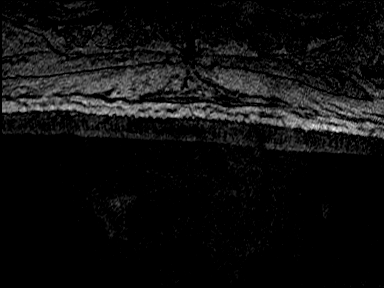
[im 8/343]
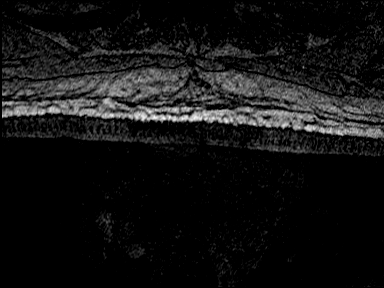
[im 15/343]
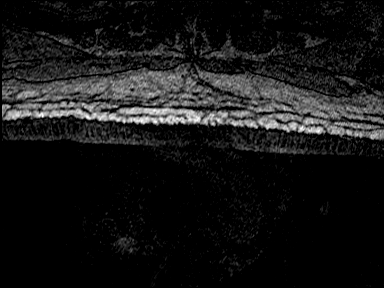
[im 22/343]
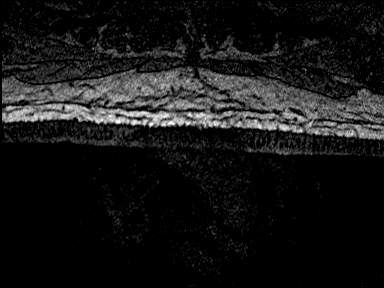
[im 30/343]
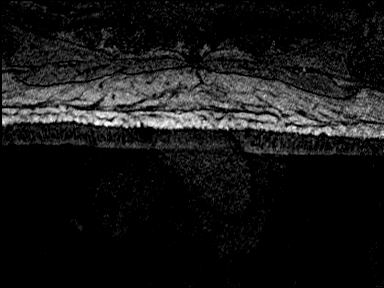
[im 37/343]
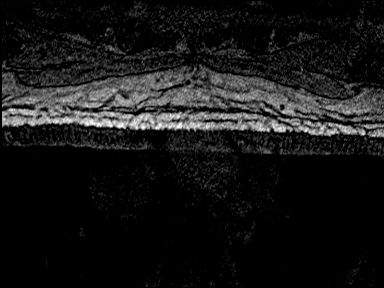
[im 44/343]
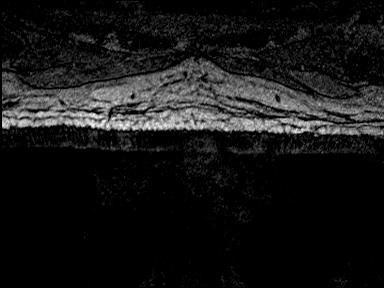
[im 51/343]
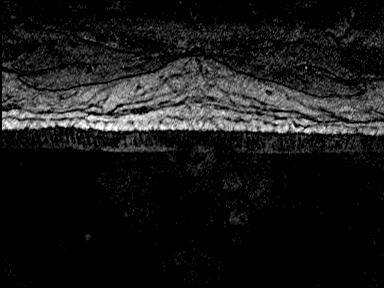
[im 59/343]
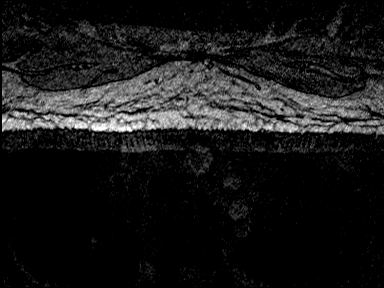
[im 66/343]
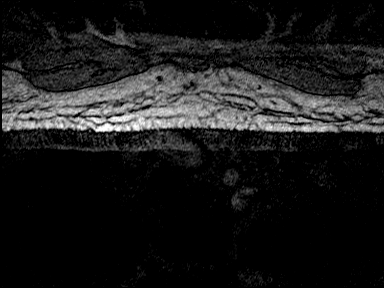
[im 73/343]
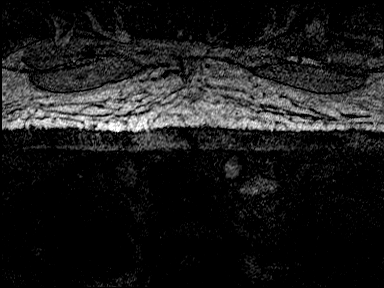
[im 81/343]
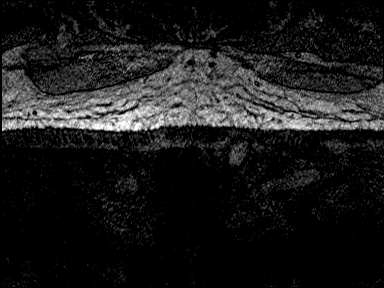
[im 88/343]
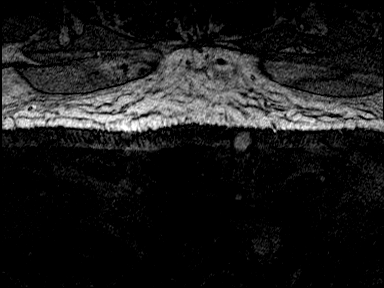
[im 95/343]
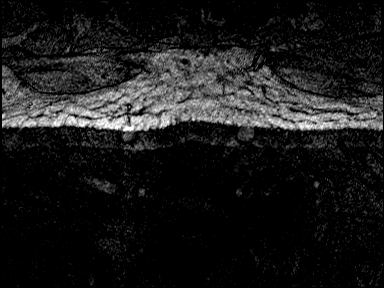
[im 102/343]
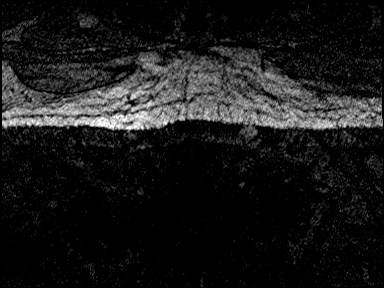
[im 110/343]
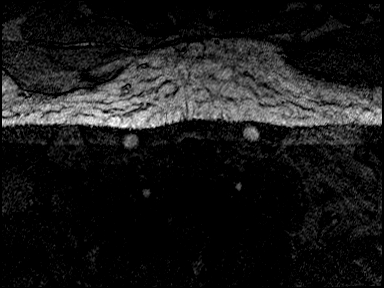
[im 117/343]
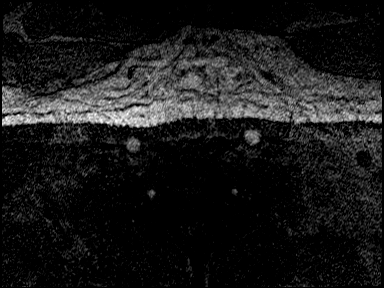
[im 124/343]
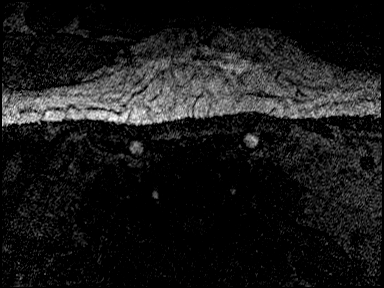
[im 131/343]
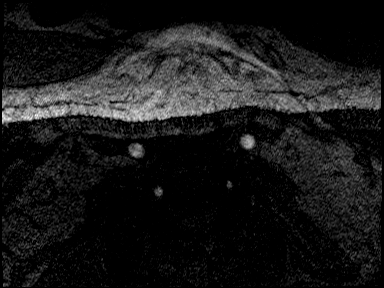
[im 139/343]
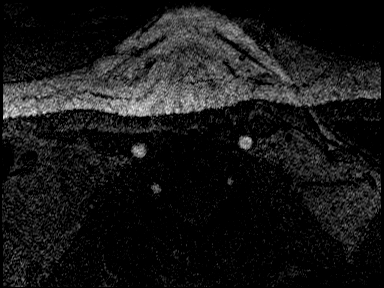
[im 146/343]
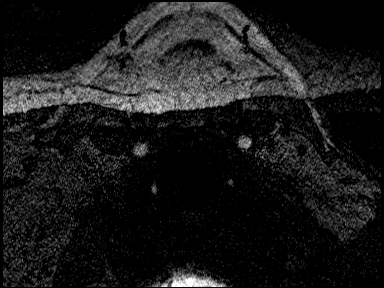
[im 153/343]
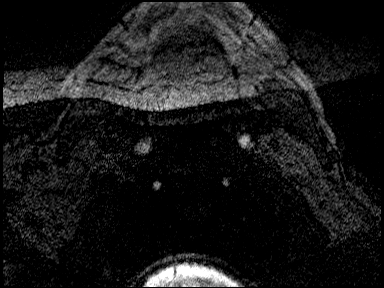
[im 161/343]
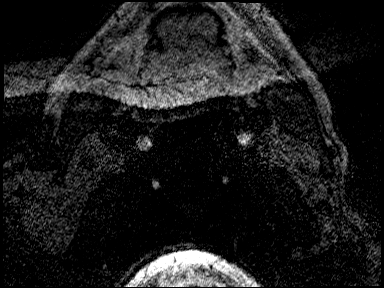
[im 168/343]
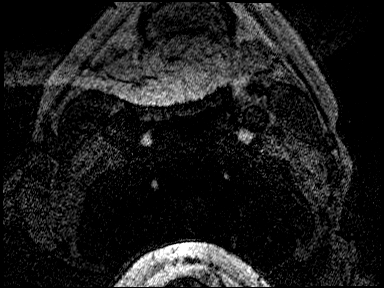
[im 175/343]
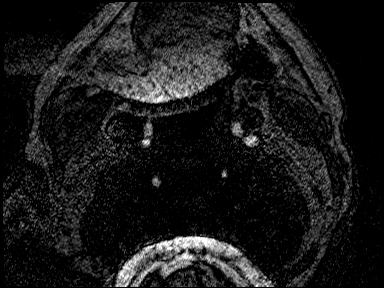
[im 182/343]
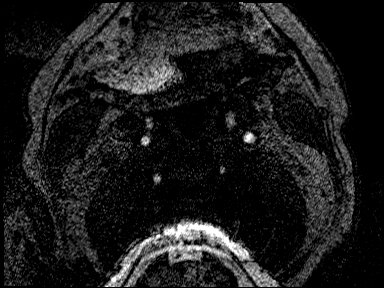
[im 190/343]
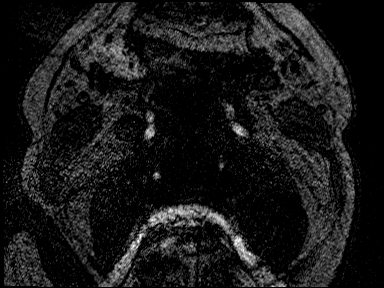
[im 197/343]
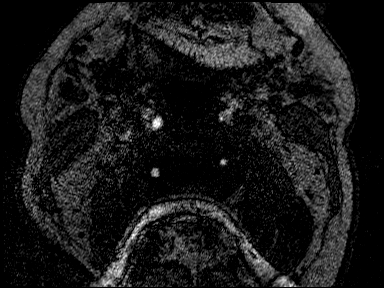
[im 204/343]
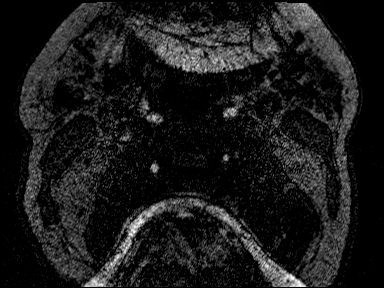
[im 212/343]
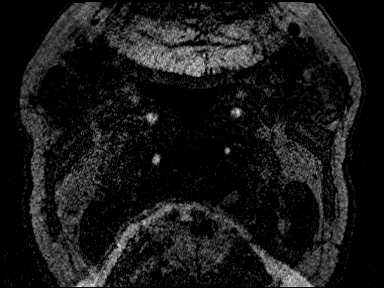
[im 219/343]
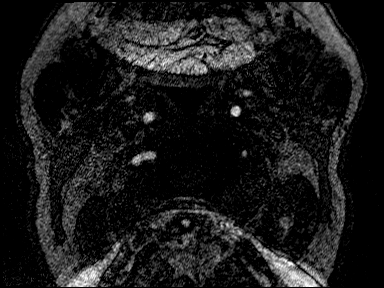
[im 226/343]
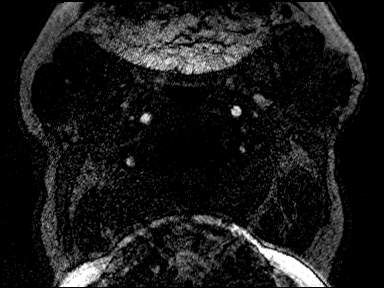
[im 233/343]
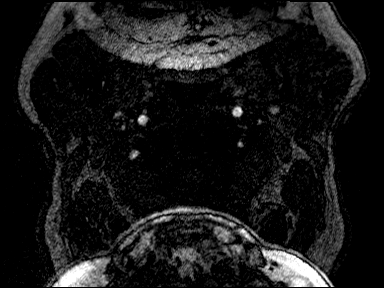
[im 241/343]
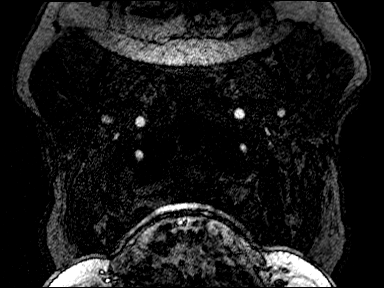
[im 248/343]
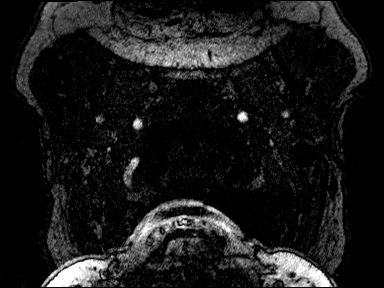
[im 255/343]
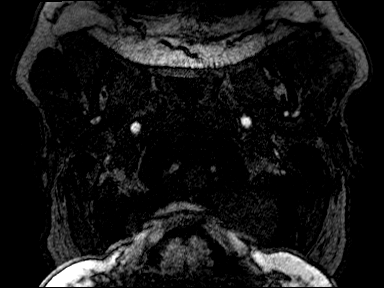
[im 262/343]
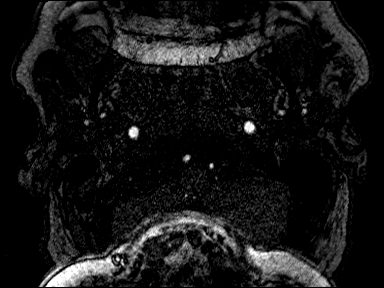
[im 270/343]
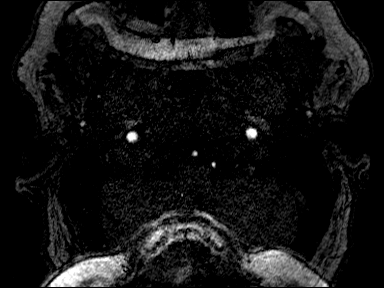
[im 277/343]
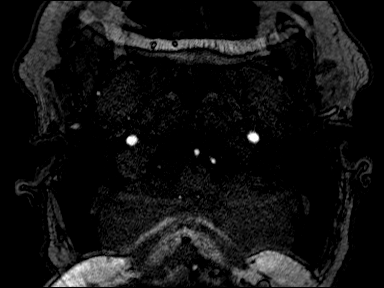
[im 284/343]
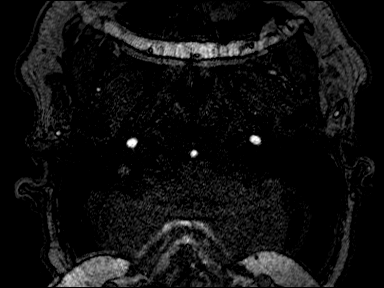
[im 292/343]
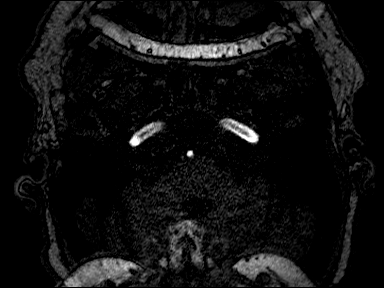
[im 299/343]
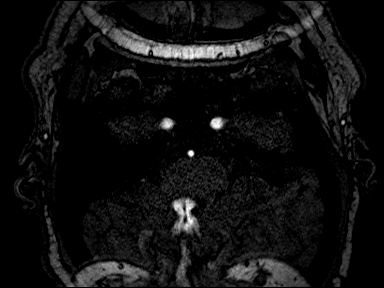
[im 328/343]
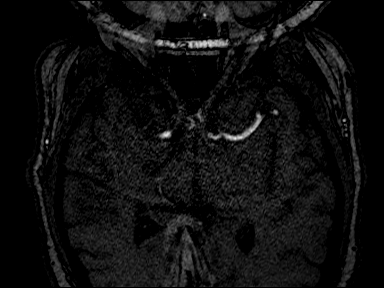

[43 of 48 positions shown; findings below may reference images not displayed]

FINDINGS: MRA NECK FINDINGS

Artifact limits evaluation, particularly proximally. Within this
limitation, no visible high-grade stenosis of either the carotid
systems or vertebral arteries. Right vertebral artery is dominant.
Also, in plane loss of flow related signal limits evaluation of the
V3 vertebral arteries and proximal left vertebral artery.

MRA HEAD FINDINGS

Anterior circulation: Bilateral intracranial ICAs, MCAs, and ACAs
are patent. Probably severe proximal right M2 MCA stenosis per no
aneurysm identified.

Posterior circulation: Bilateral intradural vertebral arteries,
basilar artery, and posterior cerebral arteries are patent without
proximal hemodynamically significant stenosis. Right posterior
communicating artery is present. No aneurysm identified.
IMPRESSION: MRA head:

1. No emergent large vessel occlusion.
2. Probably severe proximal right M2 MCA stenosis.

MRA neck:

Limited by artifact without visible high-grade stenosis.

## 2021-12-22 MED ORDER — FLEET ENEMA 7-19 GM/118ML RE ENEM: 1.0000 | ENEMA | Freq: Once | RECTAL | Status: DC | PRN

## 2021-12-22 MED ORDER — GABAPENTIN 100 MG PO CAPS
100.0000 mg | ORAL_CAPSULE | Freq: Three times a day (TID) | ORAL | Status: DC
  Administered 2021-12-22 – 2021-12-25 (×10): 100 mg via ORAL
  Filled 2021-12-22 (×10): qty 1

## 2021-12-22 MED ORDER — TICAGRELOR 90 MG PO TABS
90.0000 mg | ORAL_TABLET | Freq: Two times a day (BID) | ORAL | Status: DC
  Administered 2021-12-22 – 2021-12-25 (×8): 90 mg via ORAL
  Filled 2021-12-22 (×7): qty 1

## 2021-12-22 MED ORDER — ATORVASTATIN CALCIUM 80 MG PO TABS
80.0000 mg | ORAL_TABLET | Freq: Every day | ORAL | Status: DC
  Administered 2021-12-22 – 2021-12-25 (×4): 80 mg via ORAL
  Filled 2021-12-22 (×2): qty 1
  Filled 2021-12-22: qty 4
  Filled 2021-12-22: qty 1

## 2021-12-22 MED ORDER — ASPIRIN 81 MG PO CHEW
81.0000 mg | CHEWABLE_TABLET | Freq: Every day | ORAL | Status: DC
  Administered 2021-12-22 – 2021-12-25 (×4): 81 mg via ORAL
  Filled 2021-12-22 (×4): qty 1

## 2021-12-22 MED ORDER — OXYCODONE HCL 5 MG PO TABS: 5.0000 mg | ORAL_TABLET | Freq: Four times a day (QID) | ORAL | Status: DC | PRN

## 2021-12-22 MED ORDER — INSULIN DETEMIR 100 UNIT/ML ~~LOC~~ SOLN
20.0000 [IU] | Freq: Two times a day (BID) | SUBCUTANEOUS | Status: DC
  Administered 2021-12-22 – 2021-12-25 (×7): 20 [IU] via SUBCUTANEOUS
  Filled 2021-12-22 (×8): qty 0.2

## 2021-12-22 MED ORDER — ACETAMINOPHEN 325 MG PO TABS
650.0000 mg | ORAL_TABLET | Freq: Four times a day (QID) | ORAL | Status: DC | PRN
  Administered 2021-12-22 – 2021-12-24 (×3): 650 mg via ORAL
  Filled 2021-12-22 (×3): qty 2

## 2021-12-22 MED ORDER — OXYCODONE HCL 5 MG PO TABS
5.0000 mg | ORAL_TABLET | Freq: Three times a day (TID) | ORAL | Status: DC | PRN
  Administered 2021-12-22 – 2021-12-25 (×8): 5 mg via ORAL
  Filled 2021-12-22 (×8): qty 1

## 2021-12-22 MED ORDER — ONDANSETRON HCL 4 MG PO TABS: 4.0000 mg | ORAL_TABLET | Freq: Four times a day (QID) | ORAL | Status: DC | PRN

## 2021-12-22 MED ORDER — CARVEDILOL 3.125 MG PO TABS
3.1250 mg | ORAL_TABLET | Freq: Two times a day (BID) | ORAL | Status: DC
  Administered 2021-12-22 – 2021-12-25 (×7): 3.125 mg via ORAL
  Filled 2021-12-22 (×7): qty 1

## 2021-12-22 MED ORDER — HYDRALAZINE HCL 20 MG/ML IJ SOLN: 10.0000 mg | Freq: Four times a day (QID) | INTRAMUSCULAR | Status: DC | PRN

## 2021-12-22 MED ORDER — LACTATED RINGERS IV SOLN: INTRAVENOUS | Status: DC

## 2021-12-22 MED ORDER — POLYETHYLENE GLYCOL 3350 17 G PO PACK
17.0000 g | PACK | Freq: Every day | ORAL | Status: DC | PRN
  Administered 2021-12-24: 17 g via ORAL
  Filled 2021-12-22: qty 1

## 2021-12-22 MED ORDER — VENLAFAXINE HCL ER 75 MG PO CP24
75.0000 mg | ORAL_CAPSULE | Freq: Every day | ORAL | Status: DC
  Administered 2021-12-22 – 2021-12-25 (×4): 75 mg via ORAL
  Filled 2021-12-22 (×5): qty 1

## 2021-12-22 MED ORDER — HEPARIN SODIUM (PORCINE) 5000 UNIT/ML IJ SOLN
5000.0000 [IU] | Freq: Two times a day (BID) | INTRAMUSCULAR | Status: DC
  Administered 2021-12-22 – 2021-12-24 (×6): 5000 [IU] via SUBCUTANEOUS
  Filled 2021-12-22 (×6): qty 1

## 2021-12-22 MED ORDER — PERFLUTREN LIPID MICROSPHERE
1.0000 mL | INTRAVENOUS | Status: AC | PRN
  Administered 2021-12-22: 3 mL via INTRAVENOUS
  Filled 2021-12-22: qty 10

## 2021-12-22 MED ORDER — FENOFIBRATE 160 MG PO TABS
160.0000 mg | ORAL_TABLET | Freq: Every day | ORAL | Status: DC
  Administered 2021-12-22 – 2021-12-25 (×4): 160 mg via ORAL
  Filled 2021-12-22 (×4): qty 1

## 2021-12-22 MED ORDER — ONDANSETRON HCL 4 MG/2ML IJ SOLN: 4.0000 mg | Freq: Four times a day (QID) | INTRAMUSCULAR | Status: DC | PRN

## 2021-12-22 MED ORDER — INSULIN ASPART 100 UNIT/ML IJ SOLN
0.0000 [IU] | Freq: Three times a day (TID) | INTRAMUSCULAR | Status: DC
  Administered 2021-12-22: 5 [IU] via SUBCUTANEOUS
  Administered 2021-12-22 – 2021-12-23 (×3): 3 [IU] via SUBCUTANEOUS
  Administered 2021-12-23 (×2): 2 [IU] via SUBCUTANEOUS
  Administered 2021-12-24 (×2): 3 [IU] via SUBCUTANEOUS
  Administered 2021-12-25: 2 [IU] via SUBCUTANEOUS
  Filled 2021-12-22 (×8): qty 1

## 2021-12-22 MED ORDER — DOCUSATE SODIUM 100 MG PO CAPS
100.0000 mg | ORAL_CAPSULE | Freq: Two times a day (BID) | ORAL | Status: DC
  Administered 2021-12-22 – 2021-12-25 (×7): 100 mg via ORAL
  Filled 2021-12-22 (×7): qty 1

## 2021-12-22 MED ORDER — HEPARIN SODIUM (PORCINE) 5000 UNIT/ML IJ SOLN: 5000.0000 [IU] | Freq: Three times a day (TID) | INTRAMUSCULAR | Status: DC

## 2021-12-22 MED ORDER — ACETAMINOPHEN 650 MG RE SUPP: 650.0000 mg | Freq: Four times a day (QID) | RECTAL | Status: DC | PRN

## 2021-12-22 MED ORDER — BISACODYL 5 MG PO TBEC
5.0000 mg | DELAYED_RELEASE_TABLET | Freq: Every day | ORAL | Status: DC | PRN
  Administered 2021-12-23 – 2021-12-24 (×2): 5 mg via ORAL
  Filled 2021-12-22 (×2): qty 1

## 2021-12-22 MED ORDER — HYDROMORPHONE HCL 1 MG/ML IJ SOLN
1.0000 mg | INTRAMUSCULAR | Status: DC | PRN
  Administered 2021-12-23 – 2021-12-25 (×4): 1 mg via INTRAVENOUS
  Filled 2021-12-22 (×5): qty 1

## 2021-12-22 MED ORDER — SODIUM CHLORIDE 0.9% FLUSH
3.0000 mL | Freq: Two times a day (BID) | INTRAVENOUS | Status: DC
  Administered 2021-12-22 – 2021-12-25 (×8): 3 mL via INTRAVENOUS

## 2021-12-22 NOTE — Assessment & Plan Note (Signed)
Prn tylenol and IS. ?Pain meds currently held due to hypotension.  ? ?

## 2021-12-22 NOTE — Progress Notes (Signed)
*  PRELIMINARY RESULTS* ?Echocardiogram ?2D Echocardiogram has been performed. ? ?Andrew Pruitt ?12/22/2021, 11:02 AM ?

## 2021-12-22 NOTE — Assessment & Plan Note (Signed)
Continue statin therapy.

## 2021-12-22 NOTE — Telephone Encounter (Signed)
Pt discharged 4/13 at 1600, fell getting out of car, readmitted 4/13 ?

## 2021-12-22 NOTE — TOC Initial Note (Signed)
Transition of Care (TOC) - Initial/Assessment Note  ? ? ?Patient Details  ?Name: Andrew Pruitt ?MRN: [id number] ?Date of Birth: 09-Jun-1956 ? ?Transition of Care (TOC) CM/SW Contact:    ?Shelbie Hutching, RN ?Phone Number: ?12/22/2021, 11:43 AM ? ?Clinical Narrative:                 ?Patient just discharged from the hospital yesterday after STEMI s/p cardiac cath with stent placement.  He only made it a few steps before falling at home and fracturing his ribs.   ?RNCM met with patient at the bedside introduced self and explained role.  Patient is in a lot of discomfort and pain from the rib fractures.  I saw patient 2 days ago while in the ICU.  Patient is from home where he is independent and lives with his wife and daughter.  He was going to move to [place] next week but the house fell through so they will have to keep looking. ?Patient may benefit from home health at discharge, MD to order PT and OT evals.   ? ?Expected Discharge Plan: [place] ?Barriers to Discharge: Continued Medical Work up ? ? ?Patient Goals and CMS Choice ?Patient states their goals for this hospitalization and ongoing recovery are:: Patient hurting not having a good week ?  ?  ? ?Expected Discharge Plan and Services ?Expected Discharge Plan: [place] ?  ?Discharge Planning Services: CM Consult ?  ?Living arrangements for the past 2 months: [place] ?                ?DME Arranged: N/A ?DME Agency: NA ?  ?  ?  ?  ?  ?  ?  ?  ? ?Prior Living Arrangements/Services ?Living arrangements for the past 2 months: [place] ?Lives with:: Spouse, Adult Children ?Patient language and need for interpreter reviewed:: Yes ?Do you feel safe going back to the place where you live?: Yes      ?Need for Family Participation in Patient Care: Yes (Comment) ?Care giver support system in place?: Yes (comment) ?  ?Criminal Activity/Legal Involvement Pertinent to Current Situation/Hospitalization: No - Comment as  needed ? ?Activities of Daily Living ?Home Assistive Devices/Equipment: None ?ADL Screening (condition at time of admission) ?Patient's cognitive ability adequate to safely complete daily activities?: Yes ?Is the patient deaf or have difficulty hearing?: No ?Does the patient have difficulty seeing, even when wearing glasses/contacts?: No ?Does the patient have difficulty concentrating, remembering, or making decisions?: No ?Patient able to express need for assistance with ADLs?: Yes ?Does the patient have difficulty dressing or bathing?: No ?Independently performs ADLs?: Yes (appropriate for developmental age) ?Does the patient have difficulty walking or climbing stairs?: No ?Weakness of Legs: None ?Weakness of Arms/Hands: None ? ?Permission Sought/Granted ?Permission sought to share information with : Case Manager, Family Supports ?Permission granted to share information with : Yes, Verbal Permission Granted ? Share Information with NAME: Truett Mcfarlan ?   ? Permission granted to share info w Relationship: spouse ? Permission granted to share info w Contact Information: [phone] ? ?Emotional Assessment ?Appearance:: Appears stated age ?Attitude/Demeanor/Rapport: Engaged ?Affect (typically observed): Accepting ?Orientation: : Oriented to Self, Oriented to Place, Oriented to  Time, Oriented to Situation ?Alcohol / Substance Use: Not Applicable ?Psych Involvement: No (comment) ? ?Admission diagnosis:  Fall [W19.XXXA] ?Patient Active Problem List  ? Diagnosis Date Noted  ? Fall 12/22/2021  ? Elevated LFTs 12/22/2021  ? SIRS (systemic  inflammatory response syndrome) ([place]) 12/22/2021  ? Absent pedal pulses 12/22/2021  ? Low blood pressure 12/22/2021  ? OSA (obstructive sleep apnea) 12/22/2021  ? Acute kidney injury superimposed on chronic kidney disease ([place]) 12/22/2021  ? Hyponatremia 12/22/2021  ? Rib fractures 12/22/2021  ? Obesity (BMI 30-39.9) 12/20/2021  ? STEMI (ST elevation myocardial infarction) ([place])  12/19/2021  ? STEMI involving left anterior descending coronary artery ([place]) 12/19/2021  ? Poorly controlled diabetes mellitus ([place]) 12/19/2021  ? Diabetic ulcer of left foot ([place]) 12/18/2021  ? Depression, recurrent ([place])   ? Nuclear sclerotic cataract of both eyes 09/07/2020  ? [place] retinal tear, right eye 08/11/2020  ? Viral warts 06/07/2020  ? Diabetic macular edema of right eye with proliferative retinopathy associated with type 2 diabetes mellitus ([place]) 04/20/2020  ? Proliferative diabetic retinopathy of right eye ([place]) 03/02/2020  ? Type 2 diabetes mellitus with proliferative diabetic retinopathy of left eye without macular edema ([place]) 03/02/2020  ? Diabetic neuropathy associated with type 2 diabetes mellitus ([place]) 02/22/2020  ? Type 2 diabetes mellitus ([place]) 02/22/2020  ? Hypertension associated with diabetes ([place]) 02/22/2020  ? Hyperlipidemia associated with type 2 diabetes mellitus ([place]) 02/22/2020  ? Prostate cancer screening 02/22/2020  ? Plantar warts 02/22/2020  ? ?PCP:  Jon Billings, NP ?Pharmacy:   ?[id number] [place]?Phone: [phone]: [phone]? ?[id number] [place],  - [address] ?[address]Phone: [phone]: [phone] ? ? ? ?Social Determinants of Health (SDOH) Interventions ?  ? ?Readmission Risk Interventions ?   ? View : No data to display.  ?  ?  ?  ? ? ? ?

## 2021-12-22 NOTE — Care Management Obs Status (Signed)
MEDICARE OBSERVATION STATUS NOTIFICATION ? ? ?Patient Details  ?Name: Andrew Pruitt ?MRN: [id number] ?Date of Birth: [birth year] ? ? ?Medicare Observation Status Notification Given:  Yes ? ? ? ?Shelbie Hutching, RN ?12/22/2021, 11:09 AM ?

## 2021-12-22 NOTE — ED Notes (Signed)
Pt lying down with SpO2 of 88% RA. Pt placed on 2L [place], Spo2 at 97%.  ?Pt denies any current chest pain or SOB. ?

## 2021-12-22 NOTE — Consult Note (Signed)
? ?Cardiology Consult  ?  ?Patient ID: Andrew Pruitt ?MRN: [id number], DOB/AGE: [birth year]  ? ?Admit date: 12/21/2021 ?Date of Consult: 12/22/2021 ? ?Primary Physician: Jon Billings, NP ?Primary Cardiologist: Nelva Bush, MD ?Requesting Provider: Girard Cooter, MD ? ?Patient Profile  ?  ?Andrew Pruitt is a 66 y.o. male with a history of recent anterior STEMI and CAD s/p LAD DES on 12/19/2021 (residual small vessel LPDA and nondominant RCA dzs), HTN, HL, poorly controlled DM, CKD III, HFrEF, ICM, dilated Ao root, OA, L foot ulceration, and depression, who is being seen today for the evaluation of elevated troponin, chest wall pain (rib fx), and finding of small acute brain infarcts in setting of mechanical fall, at the request of Dr. Posey Pronto. ? ?Past Medical History  ? ?Past Medical History:  ?Diagnosis Date  ? Arthritis   ? CAD (coronary artery disease)   ? a. 12/2021 Anterior STEMI/PCI: LM nl, LAD 95p/45m64m (2.75x34 Onyx Frontier DES), LCX 60d, LPDA 100, RCA nondom, 50p, 88m ? CKD (chronic kidney disease), stage III ([facility]  ? Depression   ? [facility] without complication [facility]  ? Dilated aortic root ([facility]  ? a. 12/2021 Echo: Ao Root 3954m ? HFrEF (heart failure with reduced ejection fraction) ([facility] ? a. 12/2021 Echo: Ef 35-40%, mld LVH, GrI DD, mid-apical anteroseptal/apical/ant AK. Nl RV fxn.  ? Hyperlipidemia   ? Hypertension   ? Ischemic cardiomyopathy   ? a. 12/2021 s/p Ant STEMI and LAD DES; b. 12/2021 Echo: EF 35-40%.  ?  ?Past Surgical History:  ?Procedure Laterality Date  ? CATARACT EXTRACTION Right   ? CATARACT EXTRACTION Left   ? CORONARY/GRAFT ACUTE MI REVASCULARIZATION N/A 12/19/2021  ? Procedure: Coronary/Graft Acute MI Revascularization;  Surgeon: EndNelva BushD;  [facility];  Service: Cardiovascular;  Laterality: N/A;  ? LEFT HEART CATH AND CORONARY ANGIOGRAPHY N/A 12/19/2021  ? Procedure: LEFT HEART CATH AND CORONARY ANGIOGRAPHY;  Surgeon: EndNelva BushD;   [facility];  Service: Cardiovascular;  Laterality: N/A;  ? TONSILLECTOMY    ?  ? ?Allergies ? ?Allergies  ?Allergen Reactions  ? Niaspan [Niacin] Other (See Comments)  ?  Red skin, red eyes  ? Trazodone Other (See Comments)  ?  nightmares  ? ? ?History of Present Illness  ?  ?65 37o. male with a history of recent anterior STEMI and CAD, HTN, HL, poorly controlled DM, CKD III, HFrEF, ICM, dilated Ao root, OA, depression, and left foot ulceration.  [facility] evening of April 11 in the setting of severe, sharp substernal chest pain associated with nausea.  He was found to have anterior ST segment elevation and underwent emergent diagnostic catheterization revealing severe proximal and mid LAD disease as well as small vessel LPDA and RCA disease, and moderate, nonobstructive left circumflex disease.  The LAD was successfully treated with a 2.75 x 34 mm Onyx frontier drug-eluting stent.  LVEDP was elevated at the time of his catheterization.  Postprocedure course and hospitalization was relatively uneventful.  Echocardiogram showed an EF of 35 to 40% with mild LVH, grade 1 diastolic dysfunction, and mid to apical anteroseptal/apical/anterior akinesis.  He was placed on guideline directed medical therapy including aspirin, high potency statin, beta-blocker, Brilinta, and ARB and discharged home April 13.   ? ?Unfortunately, upon arriving home on April 13 and getting out of his truck at home, he felt his legs become wobbly and weak and  he fell to his left side, striking his left upper chest and rib cage on a piece of wood, immediately followed by pain in his left chest.  Due to persistent pain, his wife drove him to the emergency department.  Here, ECG showed mild, persistent inferior ST segment elevation with evidence of anteroseptal infarct.  Overall, ST segment changes were stable to less pronounced than at the time of his prior admission and were not felt to be representative of an  acute STEMI.  Lab work was notable for troponin of greater than 24,000.  CT of the chest and abdomen showed minimally displaced left fifth through eighth rib fractures, and nondisplaced left anterior ninth rib fracture, and fracture through the anterior 10th rib chondral cartilage.  In the setting of leg weakness leading to fall, an MRI of the brain was performed and showed few, scattered punctate acute ischemic nonhemorrhagic infarcts involving both cerebral hemispheres.  Andrew Pruitt has significant chest wall tenderness and left-sided pleuritic chest pain but denies any recurrence of angina or dyspnea.  Further, he denies any neurologic deficits. ? ?Inpatient Medications  ?  ? aspirin  81 mg Oral Daily  ? atorvastatin  80 mg Oral Daily  ? carvedilol  3.125 mg Oral BID WC  ? docusate sodium  100 mg Oral BID  ? fenofibrate  160 mg Oral Daily  ? gabapentin  100 mg Oral TID  ? heparin  5,000 Units Subcutaneous Q12H  ? insulin aspart  0-15 Units Subcutaneous TID WC  ? insulin detemir  20 Units Subcutaneous BID  ? sodium chloride flush  3 mL Intravenous Q12H  ? ticagrelor  90 mg Oral BID  ? venlafaxine XR  75 mg Oral Q breakfast  ? ? ?Family History  ?  ?Family History  ?Problem Relation Age of Onset  ? Cancer Mother   ?     Uterine   ? Early death Mother   ? Stroke Mother   ? Diabetes Father   ? Heart attack Father   ? Heart disease Father   ? Hyperlipidemia Father   ? Diabetes Brother   ? Alzheimer's disease Maternal Grandmother   ? Kidney failure Maternal Grandfather   ? Diabetes Paternal Grandfather   ? Diabetes Brother   ? Diabetes Brother   ? Colon cancer Neg Hx   ? Pancreatic cancer Neg Hx   ? Esophageal cancer Neg Hx   ? ?He indicated that his mother is deceased. He indicated that his father is deceased. He indicated that all of his three brothers are alive. He indicated that his maternal grandmother is deceased. He indicated that his maternal grandfather is deceased. He indicated that his paternal grandmother  is deceased. He indicated that his paternal grandfather is deceased. He indicated that his daughter is alive. He indicated that his son is alive. He indicated that the status of his neg hx is unknown. ? ? ?Social History  ?  ?Social History  ? ?Socioeconomic History  ? Marital status: Married  ?  Spouse name: Not on file  ? Number of children: Not on file  ? Years of education: Not on file  ? Highest education level: Not on file  ?Occupational History  ? Not on file  ?Tobacco Use  ? Smoking status: Never  ? Smokeless tobacco: Never  ?Vaping Use  ? Vaping Use: Never used  ?Substance and Sexual Activity  ? Alcohol use: Not Currently  ? Drug use: Never  ? Sexual activity: Yes  ?  Other Topics Concern  ? Not on file  ?Social History Narrative  ? Not on file  ? ?Social Determinants of Health  ? ?Financial Resource Strain: Low Risk   ? Difficulty of Paying Living Expenses: Not very hard  ?Food Insecurity: No Food Insecurity  ? Worried About Charity fundraiser in the Last Year: Never true  ? Ran Out of Food in the Last Year: Never true  ?Transportation Needs: No Transportation Needs  ? Lack of Transportation (Medical): No  ? Lack of Transportation (Non-Medical): No  ?Physical Activity: Inactive  ? Days of Exercise per Week: 0 days  ? Minutes of Exercise per Session: 0 min  ?Stress: No Stress Concern Present  ? Feeling of Stress : Only a little  ?Social Connections: Moderately Isolated  ? Frequency of Communication with Friends and Family: More than three times a week  ? Frequency of Social Gatherings with Friends and Family: More than three times a week  ? Attends Religious Services: Never  ? Active Member of Clubs or Organizations: No  ? Attends Archivist Meetings: Never  ? Marital Status: Married  ?Intimate Partner Violence: Not At Risk  ? Fear of Current or Ex-Partner: No  ? Emotionally Abused: No  ? Physically Abused: No  ? Sexually Abused: No  ?  ? ?Review of Systems  ?  ?General:  No chills, fever, night  sweats or weight changes.  ?Cardiovascular:  +++ Left-sided chest wall tenderness and pleuritic chest pain, no dyspnea on exertion, edema, orthopnea, palpitations, paroxysmal nocturnal dyspnea. ?Dermatolo

## 2021-12-22 NOTE — Progress Notes (Signed)
PT Cancellation Note ? ?Patient Details ?Name: Andrew Pruitt ?MRN: [id number] ?DOB: 1956/03/24 ? ? ?Cancelled Treatment:    Reason Eval/Treat Not Completed: Patient declined, no reason specified;Other (comment) (Pt returning from imaging. Educated on the benefit of PT/OT, pt still refusing. Wants to hold til tomorrow.) ? ? ?Zi Sek A Shelsy Seng ?12/22/2021, 3:16 PM ?

## 2021-12-22 NOTE — Assessment & Plan Note (Addendum)
EKG concerning for the STEMI along with the elevated troponin.  Case has been discussed with on-call STEMI cardiologist and ED provider and cardiologist feels it is not not a STEMI rather than troponin is recovering down and the chest discomfort is from the rib fractures.  We will continue with monitoring of the patient in progressive unit with continuous cardiac monitoring. ?We will send a a.m. message for Dr. Quentin Ore to consult. ?We will continue statin/ antiplatelets. ?

## 2021-12-22 NOTE — Progress Notes (Signed)
PT Cancellation Note ? ?Patient Details ?Name: Andrew Pruitt ?MRN: [id number] ?DOB: October 18, 1955 ? ? ?Cancelled Treatment:    Reason Eval/Treat Not Completed: Other (comment) (Pt transferred to the floor.) ?Will follow up on the floor ? ?Andrew Pruitt ?12/22/2021, 2:51 PM ?

## 2021-12-22 NOTE — Assessment & Plan Note (Signed)
Patient has absent pedal pulses in both feet which is a known condition with podiatry following. ?Patient had an ABI with results as follows:IMPRESSION: ?1. Right ankle-brachial index could not be obtained due to ?incompressible vessels. However, normal waveforms in the right ?ankle. ?2. Left ankle-brachial index is normal. ?I have discussed with the wife about considering a consult with vascular as he still can have chronic limb ischemia due to peripheral arterial disease and because of decreased pulses we do not want him to develop healing complications with his diabetic foot ulcer.  Patient to discuss it with her heart doctor and her vascular doctor. ?

## 2021-12-22 NOTE — Assessment & Plan Note (Signed)
Although pt meets SIRS I do not suspect an infection. ?Will follow lactic which may be high because of hypotension. ?PCT  pending. ? ?

## 2021-12-22 NOTE — Assessment & Plan Note (Addendum)
Wound is dressed, due to absent pedal pulses and inconclusive ABIs I have advised the patient to follow-up with a vascular doctor on outpatient basis. ?Wife verbalized understanding.d/w that circulation is key for his wound healing.  ?Currently patient to continue his aspirin, atorvastatin, Brilinta. ?

## 2021-12-22 NOTE — Assessment & Plan Note (Signed)
Pt has OSA and needs cpap or oral appliance. ?We will defer to PCP to refer and arrange that for patient. ?

## 2021-12-22 NOTE — Progress Notes (Signed)
OT Cancellation Note ? ?Patient Details ?Name: Andrew Pruitt ?MRN: [id number] ?DOB: [birth year] ? ? ?Cancelled Treatment:    Reason Eval/Treat Not Completed: Patient at procedure or test/ unavailable. Pt out of the room upon attempt for imaging. Will re-attempt OT evaluation at later date/time as pt is available and medically appropriate.  ? ?Ardeth Perfect., MPH, MS, OTR/L ?ascom [phone] ?12/22/21, 2:40 PM ? ?

## 2021-12-22 NOTE — Progress Notes (Addendum)
? ? ? ?Progress Note  ? ? Pavle Wiler  QJF:[id number] DOB: [birth year]  DOA: 12/21/2021 ?PCP: Jon Billings, NP  ? ? ? ? ?Brief Narrative:  ? ? ?Medical records reviewed and are as summarized below: ? ?Andrew Pruitt is a 66 y.o. male with medical history significant for CAD with recent STEMI s/p DES to LAD on 12/19/2021, hyperlipidemia, poorly controlled insulin-dependent diabetes mellitus, chronic systolic and diastolic CHF, CKD stage IIIb, dilated aortic root, arthritis, depression, left foot wound, OSA on CPAP intermittently at home.  He was discharged from the hospital on 12/21/2021.  However, he said that he only took about 10 steps from his truck and his legs buckled and he fell before he could reach his house.  He fell on a piece of wood on his left side.  He complained of severe left-sided pleuritic chest pain.  He returned to the emergency room for further evaluation. ? ?He was found to have left-sided 5th-10th rib fractures and acute stroke. ? ? ? ? ?Assessment/Plan:  ? ?Principal Problem: ?  CVA (cerebral vascular accident) ([place]) ?Active Problems: ?  Fall ?  Low blood pressure ?  STEMI involving left anterior descending coronary artery ([place]) ?  SIRS (systemic inflammatory response syndrome) ([facility]) ?  Type 2 diabetes mellitus with proliferative diabetic retinopathy of left eye without macular edema ([facility]) ?  Diabetic ulcer of left foot ([place]) ?  Absent pedal pulses ?  Hyperlipidemia associated with type 2 diabetes mellitus ([place]) ?  Elevated LFTs ?  OSA (obstructive sleep apnea) ?  Hyponatremia ?  Rib fractures ?  Stage 3b chronic kidney disease (CKD) ([place]) ? ? ? ?Acute stroke: Consulted neurologist to assist with management.  Continue Lipitor, aspirin and Brilinta.  Consult PT and OT ? ?S/p fall leading to left-sided 5th-10th rib fractures: Use IV Dilaudid, Tylenol and oxycodone as needed for pain.  PT and OT evaluation.. ? ?Orthostatic hypotension: Repeat orthostatic vital signs as able.  Discontinue IV  fluids to avoid fluid overload because of underlying CHF. ? ?Recent STEMI s/p DES to LAD on 12/19/2021.  Continue aspirin, Brilinta and Lipitor. ? ?Chronic systolic and diastolic CHF: Continue carvedilol.  2D echo on 12/20/2021 showed EF estimated at 35 to 89%, grade 1 diastolic dysfunction, mild dilation of aortic root. ? ?Insulin-dependent type 2 diabetes mellitus with hyperglycemia: Hemoglobin A1c was 11.  Continue Levemir and NovoLog as needed. ? ?Diabetic left foot ulcer in the interdigital space between the left big and second toes.  He was seen by Dr. Guy Sandifer, podiatrist, on 12/20/2021.  Continue local wound care with Betadine dressing. ? ?Nocturnal hypoxemia: This is probably from OSA.  Wean off oxygen as able.  Encouraged him to use CPAP at night. ? ?Other comorbidities include hypertension, hyperlipidemia, OSA, CKD stage IIIb (creatinine appears to be at baseline and there is no AKI). ? ? ? ? ?Diet Order   ? ?       ?  Diet Carb Modified Fluid consistency: Thin; Room service appropriate? Yes  Diet effective now       ?  ? ?  ?  ? ?  ? ? ? ? ?Consultants: ?Neurologist ?Cardiologist ? ?Procedures: ?None ? ? ? ?Medications:  ? ? aspirin  81 mg Oral Daily  ? atorvastatin  80 mg Oral Daily  ? carvedilol  3.125 mg Oral BID WC  ? docusate sodium  100 mg Oral BID  ? fenofibrate  160 mg Oral Daily  ? gabapentin  100 mg Oral  TID  ? heparin  5,000 Units Subcutaneous Q12H  ? insulin aspart  0-15 Units Subcutaneous TID WC  ? insulin detemir  20 Units Subcutaneous BID  ? sodium chloride flush  3 mL Intravenous Q12H  ? ticagrelor  90 mg Oral BID  ? venlafaxine XR  75 mg Oral Q breakfast  ? ?Continuous Infusions: ? ? ? ? ?Anti-infectives (From admission, onward)  ? ? None  ? ?  ? ? ? ? ? ? ? ? ? ?Family Communication/Anticipated D/C date and plan/Code Status  ? ?DVT prophylaxis: heparin injection 5,000 Units Start: 12/22/21 0130 ? ? ?  Code Status: Full Code ? ?Family Communication: None ?Disposition Plan: Plan to  discharge home versus SNF ? ? ?Status is: Observation ?The patient will require care spanning > 2 midnights and should be moved to inpatient because: Acute stroke, uncontrolled pain from her fracture ? ? ? ? ? ? ?Subjective:  ? ?Interval events noted.  He complains of left-sided pleuritic chest pain.  No shortness of breath, dizziness, palpitations. ? ?Objective:  ? ? ?Vitals:  ? 12/22/21 0930 12/22/21 1000 12/22/21 1053 12/22/21 1200  ?BP: 130/62 124/63 (!) 157/56 (!) 148/50  ?Pulse: 90 89 89 84  ?Resp:   17 18  ?Temp:    98.4 ?F (36.9 ?C)  ?TempSrc:    Oral  ?SpO2: 95% 95% 97% 98%  ? ?Orthostatic VS for the past 24 hrs: ? BP- Lying Pulse- Lying BP- Sitting Pulse- Sitting  ?12/22/21 0132 132/79 79 98/66 84  ? ? ?No intake or output data in the 24 hours ending 12/22/21 1228 ?There were no vitals filed for this visit. ? ?Exam: ? ?GEN: NAD ?SKIN: Warm and dry.  Small wound in the interdigital webspace between the first big and second toes ?EYES: EOMI ?ENT: MMM ?CV: RRR ?PULM: CTA B ?ABD: soft, obese, NT, +BS ?CNS: AAO x 3, non focal ?EXT: No edema or tenderness ? ? ? ?  ? ? ?Data Reviewed:  ? ?I have personally reviewed following labs and imaging studies: ? ?Labs: ?Labs show the following:  ? ?Basic Metabolic Panel: ?Recent Labs  ?Lab 12/19/21 ?2054 12/20/21 ?0207 12/21/21 ?6213 12/21/21 ?2056 12/22/21 ?0505  ?NA 134* 136 138 133* 134*  ?K 4.0 3.8 3.8 3.9 3.9  ?CL 97* 99 98 95* 95*  ?CO2 '26 25 28 25 25  '$ ?GLUCOSE 472* 450* 246* 224* 229*  ?BUN 27* 31* 39* 45* 54*  ?CREATININE 2.09* 1.92* 2.08* 2.19* 2.21*  ?CALCIUM 9.0 8.8* 9.1 9.1 8.9  ? ?GFR ?Estimated Creatinine Clearance: 45.6 mL/min (A) (by C-G formula based on SCr of 2.21 mg/dL (H)). ?Liver Function Tests: ?Recent Labs  ?Lab 12/19/21 ?2054 12/21/21 ?2056 12/22/21 ?0505  ?AST 28 91* 69*  ?ALT 53* 68* 65*  ?ALKPHOS 52 50 51  ?BILITOT 0.7 2.0* 2.0*  ?PROT 7.2 7.5 7.5  ?ALBUMIN 3.6 3.5 3.6  ? ?Recent Labs  ?Lab 12/21/21 ?2056  ?LIPASE 28  ? ?No results for input(s):  AMMONIA in the last 168 hours. ?Coagulation profile ?Recent Labs  ?Lab 12/19/21 ?2054  ?INR 0.9  ? ? ?CBC: ?Recent Labs  ?Lab 12/19/21 ?2054 12/20/21 ?0207 12/21/21 ?2056  ?WBC 7.3 10.6* 15.8*  ?NEUTROABS 3.9  --  12.6*  ?HGB 15.6 16.3 15.5  ?HCT 45.3 47.5 46.6  ?MCV 88.0 87.8 90.3  ?PLT 306 318 280  ? ?Cardiac Enzymes: ?Recent Labs  ?Lab 12/22/21 ?0505  ?CKTOTAL 556*  ? ?BNP (last 3 results) ?No results for input(s): PROBNP  in the last 8760 hours. ?CBG: ?Recent Labs  ?Lab 12/21/21 ?0808 12/21/21 ?1144 12/22/21 ?8850 12/22/21 ?2774 12/22/21 ?1137  ?GLUCAP 241* 222* 227* 220* 199*  ? ?D-Dimer: ?No results for input(s): DDIMER in the last 72 hours. ?Hgb A1c: ?Recent Labs  ?  12/20/21 ?0207  ?HGBA1C 11.0*  ? ?Lipid Profile: ?Recent Labs  ?  12/19/21 ?2054  ?CHOL 239*  ?HDL 26*  ?LDLCALC UNABLE TO CALCULATE IF TRIGLYCERIDE OVER 400 mg/dL  ?TRIG 711*  ?CHOLHDL 9.2  ?LDLDIRECT 97.3  ? ?Thyroid function studies: ?No results for input(s): TSH, T4TOTAL, T3FREE, THYROIDAB in the last 72 hours. ? ?Invalid input(s): FREET3 ?Anemia work up: ?No results for input(s): VITAMINB12, FOLATE, FERRITIN, TIBC, IRON, RETICCTPCT in the last 72 hours. ?Sepsis Labs: ?Recent Labs  ?Lab 12/19/21 ?2054 12/20/21 ?0207 12/21/21 ?2056  ?WBC 7.3 10.6* 15.8*  ? ? ?Microbiology ?Recent Results (from the past 240 hour(s))  ?Resp Panel by RT-PCR (Flu A&B, Covid) Nasopharyngeal Swab     Status: None  ? Collection Time: 12/19/21  9:02 PM  ? Specimen: Nasopharyngeal Swab; Nasopharyngeal(NP) swabs in vial transport medium  ?Result Value Ref Range Status  ? SARS Coronavirus 2 by RT PCR NEGATIVE NEGATIVE Final  ?  Comment: (NOTE) ?SARS-CoV-2 target nucleic acids are NOT DETECTED. ? ?The SARS-CoV-2 RNA is generally detectable in upper respiratory ?specimens during the acute phase of infection. The lowest ?concentration of SARS-CoV-2 viral copies this assay can detect is ?138 copies/mL. A negative result does not preclude SARS-Cov-2 ?infection and should not  be used as the sole basis for treatment or ?other patient management decisions. A negative result may occur with  ?improper specimen collection/handling, submission of specimen other ?than nasopharyngeal swab,

## 2021-12-22 NOTE — Progress Notes (Signed)
PT Cancellation Note ? ?Patient Details ?Name: Shamarcus Hoheisel ?MRN: [id number] ?DOB: [birth year] ? ? ?Cancelled Treatment:    Reason Eval/Treat Not Completed: Other (comment) (Pt off the floor for imagining will follow back up as time allows, if medically stable.) ? ? ?Tomothy Eddins A Myles Tavella ?12/22/2021, 2:52 PM ?

## 2021-12-22 NOTE — Assessment & Plan Note (Signed)
Due to farxiga.  ?Hold today. ?Low rate MIVF. ?Restart at half dose or cut down on other BP meds to prevent orthostatic complications in future. ? ?

## 2021-12-22 NOTE — Telephone Encounter (Signed)
Copied from [place] [phone]. Topic: Appointment Scheduling - Scheduling Inquiry for Clinic ?>> Dec 22, 2021 10:58 AM Yvette Rack wrote: ?Reason for CRM: Pt spouse reports pt is in the hospital and will not be available for his telephone appt scheduled for today. ?

## 2021-12-22 NOTE — Assessment & Plan Note (Signed)
Attribute to mild shock liver presentation due to hypotension and hypoperfusion. ?We will get GGT and follow.  ? ? ? ?

## 2021-12-22 NOTE — Assessment & Plan Note (Signed)
HOLD farxiga due to low BP, restart at lower dose.  ?Start glycemic protocol.  ?

## 2021-12-22 NOTE — Assessment & Plan Note (Addendum)
Awaiting for orthostatic blood pressures in the supine and sitting positions.  Initial vitals on arrival low blood pressure with systolics of 119.  I suspect patient's low blood pressure is contributing to his fall with [place] on board. ?We will currently hold his antihypertensives and hand antihyperglycemic's tonight. ?We will restart patient on half dose of losartan and down titrate other antihypertensives and restart patient's Farxiga. ?>> POSITIVE  ORTHOSTATIC: ?Orthostatic Lying:?132/79  Pulse- Lying:?79  ?Orthostatic Sitting BP- Sitting:?98/66  Pulse- Sitting:?84  ?

## 2021-12-22 NOTE — Assessment & Plan Note (Signed)
Due to fall and patient's risk factors it is prudent for [place] to rule out CVA TIA. ?We will proceed with an MRI noncontrast of the brain. ?Fall precautions. ?Aspiration precautions. ?Physical therapy and ambulation with assistance only. ?

## 2021-12-22 NOTE — Progress Notes (Signed)
Admission profile updated. ?

## 2021-12-22 NOTE — Assessment & Plan Note (Signed)
Lab Results  ?Component Value Date  ? CREATININE 2.19 (H) 12/21/2021  ? CREATININE 2.08 (H) 12/21/2021  ? CREATININE 1.92 (H) 12/20/2021  ?Patient's developed mild worsening in kidney function. ?I suspect this is a combination of him being hypotensive today in addition to [place].  In addition to his recent cardiac cath. ?We will currently hold patient's losartan and monitor renal function. ?We will restart at a lower dose and monitor fattening and GFR. ?

## 2021-12-23 ENCOUNTER — Other Ambulatory Visit: Payer: Self-pay | Admitting: Nurse Practitioner

## 2021-12-23 DIAGNOSIS — I6310 Cerebral infarction due to embolism of unspecified precerebral artery: Secondary | ICD-10-CM

## 2021-12-23 DIAGNOSIS — I639 Cerebral infarction, unspecified: Secondary | ICD-10-CM | POA: Diagnosis not present

## 2021-12-23 DIAGNOSIS — E113592 Type 2 diabetes mellitus with proliferative diabetic retinopathy without macular edema, left eye: Secondary | ICD-10-CM

## 2021-12-23 DIAGNOSIS — N1832 Chronic kidney disease, stage 3b: Secondary | ICD-10-CM

## 2021-12-23 LAB — BASIC METABOLIC PANEL
Anion gap: 13 (ref 5–15)
BUN: 48 mg/dL — ABNORMAL HIGH (ref 8–23)
CO2: 24 mmol/L (ref 22–32)
Calcium: 8.3 mg/dL — ABNORMAL LOW (ref 8.9–10.3)
Chloride: 95 mmol/L — ABNORMAL LOW (ref 98–111)
Creatinine, Ser: 1.67 mg/dL — ABNORMAL HIGH (ref 0.61–1.24)
GFR, Estimated: 45 mL/min — ABNORMAL LOW (ref >60)
Glucose, Bld: 119 mg/dL — ABNORMAL HIGH (ref 70–99)
Potassium: 3.3 mmol/L — ABNORMAL LOW (ref 3.5–5.1)
Sodium: 132 mmol/L — ABNORMAL LOW (ref 135–145)

## 2021-12-23 LAB — GLUCOSE, CAPILLARY
Glucose-Capillary: 144 mg/dL — ABNORMAL HIGH (ref 70–99)
Glucose-Capillary: 138 mg/dL — ABNORMAL HIGH (ref 70–99)
Glucose-Capillary: 181 mg/dL — ABNORMAL HIGH (ref 70–99)
Glucose-Capillary: 146 mg/dL — ABNORMAL HIGH (ref 70–99)
Glucose-Capillary: 119 mg/dL — ABNORMAL HIGH (ref 70–99)

## 2021-12-23 LAB — CK: Total CK: 244 U/L (ref 49–397)

## 2021-12-23 LAB — TROPONIN I (HIGH SENSITIVITY): Troponin I (High Sensitivity): 23416 ng/L (ref <18)

## 2021-12-23 LAB — LACTIC ACID, PLASMA: Lactic Acid, Venous: 1.5 mmol/L (ref 0.5–1.9)

## 2021-12-23 NOTE — Plan of Care (Signed)

## 2021-12-23 NOTE — Evaluation (Signed)
Physical Therapy Evaluation ?Patient Details ?Name: Andrew Pruitt ?MRN: [id number] ?DOB: [birth year] ?Today's Date: 12/23/2021 ? ?History of Present Illness ? Pt is a 66 y/o M with recent STEMI s/p DES to LAD on 12/19/21 & was d/c on 12/21/21 but reports he only took about 10 steps from his truck & his legs buckled & he fell on a peice of wood on his L side before he could reach his house. Pt returned to the ER for further evaluation. Pt was found to have L sided 5-10 rib fx & acute stroke. MRI brain showed acute ischemic infarcts, punctate, bilateral, c/w central embolic source. PMH: CAD, HLD, poorly controlled IDDM, chronic systolic & diastolic CHF, CKD 3B, dilated aortic root, arthritis, depression, L food wound, OSA on CPAP  ?Clinical Impression ? Pt seen for PT evaluation with co-tx with OT as pt with pain limiting mobility. Pt reports prior to admission he was independent without AD, driving & living with his wife in the basement level of his children's home. On this date, PT educates pt on log rolling technique for comfort, use of pillow to help alleviate pain when moving/coughing/sneezing, breathing techniques during exertional movement, importance of OOB mobility & anticipated progress with therapy. Pt is significantly limited by L sided rib pain despite being premedicated for session. Pt requires mod assist for bed mobility & requires prolonged seated rest breaks between each activity 2/2 pain. Pt is able to complete STS with supervision but requires CGA for standing & stand pivot to recliner. Pt initially reports dizziness upon standing but BP WNL. After transferring to recliner pt noted to be in cold sweat - nurse in room & aware. Educated pt on importance of mobilizing with nurses over the weekend. Anticipate pt will progress well once pain is more controlled.   ? ?Recommendations for follow up therapy are one component of a multi-disciplinary discharge planning process, led by the attending physician.   Recommendations may be updated based on patient status, additional functional criteria and insurance authorization. ? ?Follow Up Recommendations Home health PT ? ?  ?Assistance Recommended at Discharge Intermittent Supervision/Assistance  ?Patient can return home with the following ? A little help with walking and/or transfers;A little help with bathing/dressing/bathroom;Assistance with cooking/housework;Help with stairs or ramp for entrance ? ?  ?Equipment Recommendations None recommended by PT  ?Recommendations for Other Services ?    ?  ?Functional Status Assessment Patient has had a recent decline in their functional status and demonstrates the ability to make significant improvements in function in a reasonable and predictable amount of time.  ? ?  ?Precautions / Restrictions Precautions ?Precautions: Fall ?Restrictions ?Weight Bearing Restrictions: No  ? ?  ? ?Mobility ? Bed Mobility ?Overal bed mobility: Needs Assistance ?Bed Mobility: Rolling, Sidelying to Sit ?Rolling: Mod assist ?Sidelying to sit: Mod assist ?  ?  ?  ?General bed mobility comments: cuing for log rolling technique for comfort, HOB flat ?  ? ?Transfers ?Overall transfer level: Needs assistance ?Equipment used: None ?Transfers: Sit to/from Stand, Bed to chair/wheelchair/BSC ?Sit to Stand: Supervision, From elevated surface (close supervision) ?Stand pivot transfers: Min guard, +2 physical assistance ?  ?  ?  ?  ?General transfer comment: vcs for technique with rib pain ?  ? ?Ambulation/Gait ?  ?  ?  ?  ?  ?  ?  ?  ? ?Stairs ?  ?  ?  ?  ?  ? ?Wheelchair Mobility ?  ? ?Modified Rankin (Stroke Patients Only) ?  ? ?  ? ?  Balance Overall balance assessment: Needs assistance ?Sitting-balance support: Feet supported ?Sitting balance-Leahy Scale: Good ?  ?  ?Standing balance support: No upper extremity supported ?Standing balance-Leahy Scale: Fair ?  ?  ?  ?  ?  ?  ?  ?  ?  ?  ?  ?  ?   ? ? ? ?Pertinent Vitals/Pain Pain Assessment ?Pain Assessment:  Faces ?Faces Pain Scale: Hurts worst ?Pain Location: L sided rib pain ?Pain Descriptors / Indicators: Aching, Grimacing, Guarding, Moaning ?Pain Intervention(s): Limited activity within patient's tolerance, Monitored during session, Premedicated before session  ? ? ?Home Living Family/patient expects to be discharged to:: Private residence ?Living Arrangements: Spouse/significant other ?Available Help at Discharge: Family;Available 24 hours/day ?Type of Home: House ?Home Access: Stairs to enter ?  ?Entrance Stairs-Number of Steps: level entry to basement (pt resides on basement level of children's home, with his children residing upstairs on main level, pt with no need to go upstairs to access other level) ?  ?Home Layout: Multi-level;Able to live on main level with bedroom/bathroom ?Home Equipment: Grab bars - tub/shower;Grab bars - toilet ?Additional Comments: Pt reports he & his wife plan to move back to [place] next week.  ?  ?Prior Function Prior Level of Function : Independent/Modified Independent ?  ?  ?  ?  ?  ?  ?Mobility Comments: Pt reports independence without AD, driving. ?  ?  ? ? ?Hand Dominance  ? Dominant Hand: Right ? ?  ?Extremity/Trunk Assessment  ? Upper Extremity Assessment ?Upper Extremity Assessment: Generalized weakness ?  ? ?Lower Extremity Assessment ?Lower Extremity Assessment: Generalized weakness (LLE knee extension 3-/5 in sitting as pt limited by back pain when attempting movement) ?  ? ?   ?Communication  ? Communication: No difficulties  ?Cognition Arousal/Alertness: Awake/alert ?Behavior During Therapy: [facility] for tasks assessed/performed ?Overall Cognitive Status: Within Functional Limits for tasks assessed ?  ?  ?  ?  ?  ?  ?  ?  ?  ?  ?  ?  ?  ?  ?  ?  ?General Comments: alert and oriented x4, verbose ?  ?  ? ?  ?General Comments General comments (skin integrity, edema, etc.): BP in RUE: 145/89 mmHg in sitting, 144/100 in recliner after transfer, SPO2 >/= 90% on room air ? ?   ?Exercises    ? ?Assessment/Plan  ?  ?PT Assessment Patient needs continued PT services  ?PT Problem List Decreased strength;Decreased mobility;Decreased activity tolerance;Pain;Decreased balance;Decreased knowledge of use of DME ? ?   ?  ?PT Treatment Interventions DME instruction;Therapeutic activities;Modalities;Gait training;Therapeutic exercise;Patient/family education;Balance training;Functional mobility training;Neuromuscular re-education;Manual techniques   ? ?PT Goals (Current goals can be found in the Care Plan section)  ?Acute Rehab PT Goals ?Patient Stated Goal: decreased pain ?PT Goal Formulation: With patient ?Time For Goal Achievement: 01/06/22 ?Potential to Achieve Goals: Fair ? ?  ?Frequency Min 2X/week ?  ? ? ?Co-evaluation PT/OT/SLP Co-Evaluation/Treatment: Yes ?Reason for Co-Treatment: Other (comment) (pain limiting pt's activity tolerance) ?PT goals addressed during session: Mobility/safety with mobility;Balance ?  ?  ? ? ?  ?AM-PAC PT "6 Clicks" Mobility  ?Outcome Measure Help needed turning from your back to your side while in a flat bed without using bedrails?: A Lot ?Help needed moving from lying on your back to sitting on the side of a flat bed without using bedrails?: A Lot ?Help needed moving to and from a bed to a chair (including a wheelchair)?: A Little ?Help needed standing up  from a chair using your arms (e.g., wheelchair or bedside chair)?: A Little ?Help needed to walk in hospital room?: A Little ?Help needed climbing 3-5 steps with a railing? : A Lot ?6 Click Score: 15 ? ?  ?End of Session   ?Activity Tolerance: Patient limited by pain ?Patient left: in chair;with chair alarm set;with call bell/phone within reach ?Nurse Communication: Mobility status ?PT Visit Diagnosis: Muscle weakness (generalized) (M62.81);Difficulty in walking, not elsewhere classified (R26.2);Pain ?Pain - Right/Left: Left ?Pain - part of body:  (ribs) ?  ? ?Time: 8478-4128 ?PT Time Calculation (min) (ACUTE  ONLY): 29 min ? ? ?Charges:   PT Evaluation ?$PT Eval Low Complexity: 1 Low ?PT Treatments ?$Therapeutic Activity: 8-22 mins ?  ?   ? ? ?Lavone Nian, PT, DPT ?12/23/21, 2:09 PM ? ? ?Andrew Pruitt ?12/23/2021

## 2021-12-23 NOTE — Consult Note (Signed)
NEUROLOGY CONSULTATION NOTE  ? ?Date of service: December 23, 2021 ?Patient Name: Andrew Pruitt ?MRN:  [id number] ?DOB:  June 21, 1956 ?Reason for consult: acute ischemic stroke ?Requesting physician: Dr. Jennye Boroughs ?_ _ _   _ __   _ __ _ _  __ __   _ __   __ _ ? ?History of Present Illness  ? ?66 yo man with hx HTN, HL, DM2, STEMI 3 days ago readmitted after fall in parking lot immediately after he was discharged from hospital. MRI brain showed acute ischemic infarcts, punctate, bilateral, c/w central embolic source (personal review). MRA head showed probable severe prox R M2 MCA stenosis. He suffered 5 broken ribs in fall. ? ?Carotid US ? ?Color duplex indicates minimal heterogeneous and calcified plaque, ?with no hemodynamically significant stenosis by duplex criteria in ?the extracranial cerebrovascular circulation. ? ?Stroke Labs ? ?   ?Component Value Date/Time  ? CHOL 239 (H) 12/19/2021 2054  ? CHOL 229 (H) 08/01/2021 0944  ? TRIG 711 (H) 12/19/2021 2054  ? HDL 26 (L) 12/19/2021 2054  ? HDL 24 (L) 08/01/2021 0944  ? CHOLHDL 9.2 12/19/2021 2054  ? VLDL UNABLE TO CALCULATE IF TRIGLYCERIDE OVER 400 mg/dL 12/19/2021 2054  ? LDLCALC UNABLE TO CALCULATE IF TRIGLYCERIDE OVER 400 mg/dL 12/19/2021 2054  ? [place] 106 (H) 08/01/2021 0944  ? LDLDIRECT 97.3 12/19/2021 2054  ? LABVLDL 99 (H) 08/01/2021 0944  ? ? ?Lab Results  ?Component Value Date/Time  ? HGBA1C 11.0 (H) 12/20/2021 02:07 AM  ? ?TTE ? ?Left ventricular ejection fraction, by estimation, is 35 to 40%. The left ventricle has ?moderately decreased function. The left ventricle demonstrates regional wall motion ?abnormalities (Hypokinesis of the mid to distal anterior and apical region). ?1. ?2. Left ventricular diastolic parameters are indeterminate. ?3. Right ventricular systolic function is normal. The right ventricular size is normal. ?The mitral valve is normal in structure. No evidence of mitral valve regurgitation. No ?evidence of mitral stenosis. ?4. ?The  aortic valve is normal in structure. Aortic valve regurgitation is not visualized. No ?aortic stenosis is present. ?5. ?The inferior vena cava is normal in size with greater than 50% respiratory variability, ?suggesting right atrial pressure of 3 mmHg. ?6. ?Agitated saline contrast bubble study was negative, with no evidence of any interatrial ?shunt. ?  ?ROS  ? ?Per HPI: all other systems reviewed and are negative ? ?Past History  ? ?I have reviewed the following: ? ?Past Medical History:  ?Diagnosis Date  ? Arthritis   ? CAD (coronary artery disease)   ? a. 12/2021 Anterior STEMI/PCI: LM nl, LAD 95p/55m84m (2.75x34 Onyx Frontier DES), LCX 60d, LPDA 100, RCA nondom, 50p, 819m ? CKD (chronic kidney disease), stage III ([facility]  ? Depression   ? Diabetes mellitus without complication ([facility] [place]  ? Dilated aortic root ([facility]  ? a. 12/2021 Echo: Ao Root 3998m ? HFrEF (heart failure with reduced ejection fraction) ([facility] ? a. 12/2021 Echo: Ef 35-40%, mld LVH, GrI DD, mid-apical anteroseptal/apical/ant AK. Nl RV fxn.  ? Hyperlipidemia   ? Hypertension   ? Ischemic cardiomyopathy   ? a. 12/2021 s/p Ant STEMI and LAD DES; b. 12/2021 Echo: EF 35-40%.  ? ?Past Surgical History:  ?Procedure Laterality Date  ? CATARACT EXTRACTION Right   ? CATARACT EXTRACTION Left   ? CORONARY/GRAFT ACUTE MI REVASCULARIZATION N/A 12/19/2021  ? Procedure: Coronary/Graft Acute MI Revascularization;  Surgeon: EndNelva BushD;  [facility];  Service: Cardiovascular;  Laterality: N/A;  ? LEFT HEART CATH AND CORONARY ANGIOGRAPHY N/A 12/19/2021  ? Procedure: LEFT HEART CATH AND CORONARY ANGIOGRAPHY;  Surgeon: Nelva Bush, MD;  Location: [facility];  Service: Cardiovascular;  Laterality: N/A;  ? TONSILLECTOMY    ? ?Family History  ?Problem Relation Age of Onset  ? Cancer Mother   ?     Uterine   ? Early death Mother   ? Stroke Mother   ? Diabetes Father   ? Heart attack Father   ? Heart disease Father   ? Hyperlipidemia  Father   ? Diabetes Brother   ? Alzheimer's disease Maternal Grandmother   ? Kidney failure Maternal Grandfather   ? Diabetes Paternal Grandfather   ? Diabetes Brother   ? Diabetes Brother   ? Colon cancer Neg Hx   ? Pancreatic cancer Neg Hx   ? Esophageal cancer Neg Hx   ? ?Social History  ? ?Socioeconomic History  ? Marital status: Married  ?  Spouse name: Not on file  ? Number of children: Not on file  ? Years of education: Not on file  ? Highest education level: Not on file  ?Occupational History  ? Not on file  ?Tobacco Use  ? Smoking status: Never  ? Smokeless tobacco: Never  ?Vaping Use  ? Vaping Use: Never used  ?Substance and Sexual Activity  ? Alcohol use: Not Currently  ? Drug use: Never  ? Sexual activity: Yes  ?Other Topics Concern  ? Not on file  ?Social History Narrative  ? Not on file  ? ?Social Determinants of Health  ? ?Financial Resource Strain: Low Risk   ? Difficulty of Paying Living Expenses: Not very hard  ?Food Insecurity: No Food Insecurity  ? Worried About Charity fundraiser in the Last Year: Never true  ? Ran Out of Food in the Last Year: Never true  ?Transportation Needs: No Transportation Needs  ? Lack of Transportation (Medical): No  ? Lack of Transportation (Non-Medical): No  ?Physical Activity: Inactive  ? Days of Exercise per Week: 0 days  ? Minutes of Exercise per Session: 0 min  ?Stress: No Stress Concern Present  ? Feeling of Stress : Only a little  ?Social Connections: Moderately Isolated  ? Frequency of Communication with Friends and Family: More than three times a week  ? Frequency of Social Gatherings with Friends and Family: More than three times a week  ? Attends Religious Services: Never  ? Active Member of Clubs or Organizations: No  ? Attends Archivist Meetings: Never  ? Marital Status: Married  ? ?Allergies  ?Allergen Reactions  ? Niaspan [Niacin] Other (See Comments)  ?  Red skin, red eyes  ? Trazodone Other (See Comments)  ?  nightmares  ? ? ?Medications   ? ?Medications Prior to Admission  ?Medication Sig Dispense Refill Last Dose  ? aspirin 81 MG chewable tablet Chew 1 tablet (81 mg total) by mouth daily. 30 tablet 0 12/21/2021 at unknown  ? atorvastatin (LIPITOR) 80 MG tablet Take 1 tablet (80 mg total) by mouth daily. 90 tablet 3 12/21/2021 at unknown  ? carvedilol (COREG) 3.125 MG tablet Take 1 tablet (3.125 mg total) by mouth 2 (two) times daily with a meal. 60 tablet 0 12/21/2021 at unknown  ? dapagliflozin propanediol (FARXIGA) 10 MG TABS tablet Take 1 tablet (10 mg total) by mouth daily before breakfast. 90 tablet 1 12/21/2021 at unknown  ? fenofibrate 160 MG tablet Take  1 tablet (160 mg total) by mouth daily. 30 tablet 0 12/21/2021 at unknown  ? gabapentin (NEURONTIN) 100 MG capsule Take 1 capsule (100 mg total) by mouth 3 (three) times daily. 90 capsule 0 12/21/2021 at unknown  ? insulin aspart (NOVOLOG) 100 UNIT/ML injection Inject 4 Units into the skin 3 (three) times daily with meals. 10 mL 11 12/21/2021 at unknown  ? insulin detemir (LEVEMIR) 100 UNIT/ML FlexPen Inject 20 Units into the skin 2 (two) times daily. 12 mL 0 12/21/2021 at unknown  ? losartan (COZAAR) 25 MG tablet Take 1 tablet (25 mg total) by mouth daily. 30 tablet 0 12/21/2021 at unknown  ? ticagrelor (BRILINTA) 90 MG TABS tablet Take 1 tablet (90 mg total) by mouth 2 (two) times daily. 60 tablet 0 12/21/2021 at unknown  ? venlafaxine XR (EFFEXOR XR) 75 MG 24 hr capsule Take 1 capsule (75 mg total) by mouth daily with breakfast. 90 capsule 1 12/21/2021 at unknown  ? B-D UF III MINI PEN NEEDLES 31G X 5 MM MISC USE DAILY 100 each 0   ? Continuous Blood Gluc Receiver (FREESTYLE LIBRE 14 DAY READER) DEVI Use to check glucose levels TID; E11.9 1 each 0   ? Continuous Blood Gluc Sensor (FREESTYLE LIBRE 14 DAY SENSOR) MISC Use to check glucose TID. Change every 14 days. E 11.9 6 each 3   ?  ? ? ?Current Facility-Administered Medications:  ?  acetaminophen (TYLENOL) tablet 650 mg, 650 mg, Oral, Q6H PRN, 650  mg at 12/22/21 0725 **OR** acetaminophen (TYLENOL) suppository 650 mg, 650 mg, Rectal, Q6H PRN, Para Skeans, MD ?  aspirin chewable tablet 81 mg, 81 mg, Oral, Daily, Para Skeans, MD, 81 mg at 12/22/21

## 2021-12-23 NOTE — Evaluation (Addendum)
Occupational Therapy Evaluation ?Patient Details ?Name: Andrew Pruitt ?MRN: [id number] ?DOB: [birth year] ?Today's Date: 12/23/2021 ? ? ?History of Present Illness Pt is a 66 year old male was discharged from the hospital on 12/21/2021.  However, he said that he only took about 10 steps from his truck and his legs buckled and he fell before he could reach his house.  He fell on a piece of wood on his left side.  He complained of severe left-sided pleuritic chest pain.  He returned to the emergency room for further evaluation. Pt found with L sided 5-10th rib fracture and MRI brain showed acute ischemic infarcts, punctate, bilateral, c/w central embolic source.  ? ?Clinical Impression ?  ?Chart reviewed to date, RN cleared pt for participation in OT evaluation. Co tx completed with PT on this date. Mobility and ADL limited by rib pain. Pt provided education re: compensatory strategies from both OT and PT to manage pain  for mobility and ADL task completion. MOD A +2 required for supine>sit, supervision and increased time required for STS, short ambulatory transfer to bedside chair. Dizziness reported with supine>sit, BP remains WNL. MAX A required for LB dressing (socks) due to rib pain. Pt reports wife will assist with all ADL as needed. Pt will benefit from [facility] to address functional deficits. OT will continue to follow acutely. Pt is left in bedside chair, NAD, all needs met.  ?   ? ?Recommendations for follow up therapy are one component of a multi-disciplinary discharge planning process, led by the attending physician.  Recommendations may be updated based on patient status, additional functional criteria and insurance authorization.  ? ?Follow Up Recommendations ? Home health OT  ?  ?Assistance Recommended at Discharge Intermittent Supervision/Assistance  ?Patient can return home with the following A little help with walking and/or transfers;A little help with bathing/dressing/bathroom;Assistance with  cooking/housework;Assist for transportation;Help with stairs or ramp for entrance;Direct supervision/assist for medications management ? ?  ?Functional Status Assessment ? Patient has had a recent decline in their functional status and demonstrates the ability to make significant improvements in function in a reasonable and predictable amount of time.  ?Equipment Recommendations ? None recommended by OT  ?  ?Recommendations for Other Services   ? ? ?  ?Precautions / Restrictions Precautions ?Precautions: Fall ?Restrictions ?Weight Bearing Restrictions: No  ? ?  ? ?Mobility Bed Mobility ?Overal bed mobility: Needs Assistance ?Bed Mobility: Supine to Sit ?  ?  ?Supine to sit: Mod assist, HOB elevated ?  ?  ?  ?  ? ?Transfers ?Overall transfer level: Needs assistance ?  ?Transfers: Sit to/from Stand ?Sit to Stand: Supervision, From elevated surface ?  ?  ?  ?  ?  ?General transfer comment: vcs for technique with rib pain ?  ? ?  ?Balance Overall balance assessment: Needs assistance ?Sitting-balance support: Feet supported ?Sitting balance-Leahy Scale: Good ?  ?  ?Standing balance support: No upper extremity supported ?Standing balance-Leahy Scale: Fair ?  ?  ?  ?  ?  ?  ?  ?  ?  ?  ?  ?  ?   ? ?ADL either performed or assessed with clinical judgement  ? ?ADL Overall ADL's : Needs assistance/impaired ?  ?  ?Grooming: Wash/dry face;Sitting;Set up ?  ?  ?  ?  ?  ?  ?  ?Lower Body Dressing: Maximal assistance ?Lower Body Dressing Details (indicate cue type and reason): due to rib pain ?Toilet Transfer: Min guard ?  ?  ?  ?  ?  ?  Functional mobility during ADLs: Min guard;Supervision/safety ?   ? ? ? ?Vision Patient Visual Report: No change from baseline ?   ?   ?Perception   ?  ?Praxis   ?  ? ?Pertinent Vitals/Pain Pain Assessment ?Pain Assessment: 0-10 ?Pain Score: 7  ?Pain Location: L sided rib pain ?Pain Descriptors / Indicators: Aching, Grimacing, Guarding, Moaning ?Pain Intervention(s): Limited activity within  patient's tolerance, Monitored during session, Repositioned  ? ? ? ?Hand Dominance   ?  ?Extremity/Trunk Assessment Upper Extremity Assessment ?Upper Extremity Assessment: Overall WFL for tasks assessed ?  ?Lower Extremity Assessment ?Lower Extremity Assessment: Overall WFL for tasks assessed ?  ?  ?  ?Communication Communication ?Communication: No difficulties ?  ?Cognition Arousal/Alertness: Awake/alert ?Behavior During Therapy: [facility] for tasks assessed/performed ?Overall Cognitive Status: Within Functional Limits for tasks assessed ?  ?  ?  ?  ?  ?  ?  ?  ?  ?  ?  ?  ?  ?  ?  ?  ?General Comments: alert and oriented x4, verbose ?  ?  ?General Comments  BP 145/89 in sitting, 144/100 in chair after mobility ? ?  ?Exercises Other Exercises ?Other Exercises: edu re: role of rehab, discharge recommendations, importance of OOB mobility, compensatory strategies for rib pain ?  ?Shoulder Instructions    ? ? ?Home Living Family/patient expects to be discharged to:: Private residence ?Living Arrangements: Spouse/significant other ?Available Help at Discharge: Family;Available 24 hours/day ?Type of Home: House ?Home Access: Stairs to enter ?Entrance Stairs-Number of Steps: pt lives in basement- no STE ?  ?Home Layout: Multi-level;Able to live on main level with bedroom/bathroom ?  ?  ?Bathroom Shower/Tub: Walk-in shower ?  ?Bathroom Toilet: Standard ?Bathroom Accessibility: Yes ?  ?Home Equipment: Grab bars - tub/shower;Grab bars - toilet ?  ?  ?  ? ?  ?Prior Functioning/Environment Prior Level of Function : Independent/Modified Independent ?  ?  ?  ?  ?  ?  ?  ?  ?  ? ?  ?  ?OT Problem List: Decreased strength;Decreased activity tolerance;Pain ?  ?   ?OT Treatment/Interventions: Self-care/ADL training;Therapeutic exercise;Patient/family education;DME and/or AE instruction;Therapeutic activities;Energy conservation  ?  ?OT Goals(Current goals can be found in the care plan section) Acute Rehab OT Goals ?Patient Stated Goal: go  home ?OT Goal Formulation: With patient ?Time For Goal Achievement: 01/06/22 ?Potential to Achieve Goals: Good ?ADL Goals ?Pt Will Perform Grooming: with modified independence ?Pt Will Perform Upper Body Dressing: with modified independence ?Pt Will Perform Lower Body Dressing: with modified independence ?Pt Will Transfer to Toilet: with modified independence;ambulating  ?OT Frequency: Min 2X/week ?  ? ?Co-evaluation   ?  ?  ?  ?  ? ?  ?AM-PAC OT "6 Clicks" Daily Activity     ?Outcome Measure Help from another person eating meals?: None ?Help from another person taking care of personal grooming?: None ?Help from another person toileting, which includes using toliet, bedpan, or urinal?: A Little ?Help from another person bathing (including washing, rinsing, drying)?: A Lot ?Help from another person to put on and taking off regular upper body clothing?: A Little ?Help from another person to put on and taking off regular lower body clothing?: Total ?6 Click Score: 17 ?  ?End of Session Nurse Communication: Mobility status ? ?Activity Tolerance: Patient limited by pain ?Patient left: in chair;with chair alarm set;with call bell/phone within reach ? ?OT Visit Diagnosis: Unsteadiness on feet (R26.81);History of falling (Z91.81)  ?              ?  Time: 0349-1791 ?OT Time Calculation (min): 28 min ?Charges:  OT General Charges ?$OT Visit: 1 Visit ?OT Evaluation ?$OT Eval Moderate Complexity: 1 Mod ? ?Shanon Payor, OTD OTR/L  ?12/23/21, 1:06 PM  ?

## 2021-12-23 NOTE — Progress Notes (Signed)
? ?Cardiology Progress Note  ? ?Patient Name: Andrew Pruitt ?Date of Encounter: 12/23/2021 ? ?Primary Cardiologist: Nelva Bush, MD ? ?Subjective  ? ?Ongoing left chest wall pain and tenderness.  Denies angina/dyspnea.  Eager to go home. ? ?Inpatient Medications  ?  ?Scheduled Meds: ? aspirin  81 mg Oral Daily  ? atorvastatin  80 mg Oral Daily  ? carvedilol  3.125 mg Oral BID WC  ? docusate sodium  100 mg Oral BID  ? fenofibrate  160 mg Oral Daily  ? gabapentin  100 mg Oral TID  ? heparin  5,000 Units Subcutaneous Q12H  ? insulin aspart  0-15 Units Subcutaneous TID WC  ? insulin detemir  20 Units Subcutaneous BID  ? sodium chloride flush  3 mL Intravenous Q12H  ? ticagrelor  90 mg Oral BID  ? venlafaxine XR  75 mg Oral Q breakfast  ? ?Continuous Infusions: ? ?PRN Meds: ?acetaminophen **OR** acetaminophen, bisacodyl, hydrALAZINE, HYDROmorphone (DILAUDID) injection, ondansetron **OR** ondansetron (ZOFRAN) IV, oxyCODONE, polyethylene glycol, sodium phosphate  ? ?Vital Signs  ?  ?Vitals:  ? 12/22/21 2255 12/23/21 0405 12/23/21 0500 12/23/21 0738  ?BP: 126/78 139/78  (!) 145/75  ?Pulse: 79 86  90  ?Resp: '18 20  18  '$ ?Temp: 99.2 ?F (37.3 ?C) 98.8 ?F (37.1 ?C)  98.2 ?F (36.8 ?C)  ?TempSrc:  Oral  Oral  ?SpO2: 95% 96%  93%  ?Weight:   124 kg   ? ? ?Intake/Output Summary (Last 24 hours) at 12/23/2021 0934 ?Last data filed at 12/23/2021 0601 ?Gross per 24 hour  ?Intake --  ?Output 2370 ml  ?Net -2370 ml  ? ?Filed Weights  ? 12/23/21 0500  ?Weight: 124 kg  ? ? ?Physical Exam  ? ?GEN: Obese, in no acute distress.  ?HEENT: Grossly normal.  ?Neck: Supple, obese, difficult to gauge JVP.  No carotid bruits, or masses. ?Cardiac: RRR, no murmurs, rubs, or gallops. No clubbing, cyanosis, edema.  Radials 2+, PT 1+ and equal bilaterally.  Left foot wrapped. ?Respiratory:  Respirations regular and unlabored, diminished breath sounds bilat - limited effort. ?GI: Obese, protuberant, firm, nontender BS + x 4. ?MS: no deformity or  atrophy. ?Skin: warm and dry, no rash. ?Neuro:  Strength and sensation are intact. ?Psych: AAOx3.  Normal affect. ? ?Labs  ?  ?Chemistry ?Recent Labs  ?Lab 12/19/21 ?2054 12/20/21 ?0207 12/21/21 ?2056 12/22/21 ?0505 12/23/21 ?2956  ?NA 134*   < > 133* 134* 132*  ?K 4.0   < > 3.9 3.9 3.3*  ?CL 97*   < > 95* 95* 95*  ?CO2 26   < > '25 25 24  '$ ?GLUCOSE 472*   < > 224* 229* 119*  ?BUN 27*   < > 45* 54* 48*  ?CREATININE 2.09*   < > 2.19* 2.21* 1.67*  ?CALCIUM 9.0   < > 9.1 8.9 8.3*  ?PROT 7.2  --  7.5 7.5  --   ?ALBUMIN 3.6  --  3.5 3.6  --   ?AST 28  --  91* 69*  --   ?ALT 53*  --  68* 65*  --   ?ALKPHOS 52  --  50 51  --   ?BILITOT 0.7  --  2.0* 2.0*  --   ?GFRNONAA 34*   < > 32* 32* 45*  ?ANIONGAP 11   < > '13 14 13  '$ ? < > = values in this interval not displayed.  ?  ? ?Hematology ?Recent Labs  ?Lab 12/19/21 ?2054 12/20/21 ?  0207 12/21/21 ?2056  ?WBC 7.3 10.6* 15.8*  ?RBC 5.15 5.41 5.16  ?HGB 15.6 16.3 15.5  ?HCT 45.3 47.5 46.6  ?MCV 88.0 87.8 90.3  ?MCH 30.3 30.1 30.0  ?MCHC 34.4 34.3 33.3  ?RDW 13.2 13.2 13.6  ?PLT 306 318 280  ? ? ?Cardiac Enzymes  ?Recent Labs  ?Lab 12/19/21 ?2054 12/19/21 ?2325 12/21/21 ?2056 12/23/21 ?0806  ?TROPONINIHS 11 9,806* >24,000* 23,416*  ?  ?Lipids  ?Lab Results  ?Component Value Date  ? CHOL 239 (H) 12/19/2021  ? HDL 26 (L) 12/19/2021  ? LDLCALC UNABLE TO CALCULATE IF TRIGLYCERIDE OVER 400 mg/dL 12/19/2021  ? LDLDIRECT 97.3 12/19/2021  ? TRIG 711 (H) 12/19/2021  ? CHOLHDL 9.2 12/19/2021  ? ? ?HbA1c  ?Lab Results  ?Component Value Date  ? HGBA1C 11.0 (H) 12/20/2021  ? ? ?Radiology  ?  ?MR ANGIO HEAD WO CONTRAST ? ?Result Date: 12/22/2021 ?CLINICAL DATA:  Neuro deficit, acute, stroke suspected EXAM: MRA NECK WITHOUT CONTRAST MRA HEAD WITHOUT CONTRAST TECHNIQUE: Angiographic images of the Circle of [place] were acquired using MRA technique without intravenous contrast. COMPARISON:  MRI head from the same day. Carotid ultrasound from the same day. FINDINGS: MRA NECK FINDINGS Artifact limits  evaluation, particularly proximally. Within this limitation, no visible high-grade stenosis of either the carotid systems or vertebral arteries. Right vertebral artery is dominant. Also, in plane loss of flow related signal limits evaluation of the V3 vertebral arteries and proximal left vertebral artery. MRA HEAD FINDINGS Anterior circulation: Bilateral intracranial ICAs, MCAs, and ACAs are patent. Probably severe proximal right M2 MCA stenosis per no aneurysm identified. Posterior circulation: Bilateral intradural vertebral arteries, basilar artery, and posterior cerebral arteries are patent without proximal hemodynamically significant stenosis. Right posterior communicating artery is present. No aneurysm identified. IMPRESSION: MRA head: 1. No emergent large vessel occlusion. 2. Probably severe proximal right M2 MCA stenosis. MRA neck: Limited by artifact without visible high-grade stenosis. Electronically Signed   By: Margaretha Sheffield M.D.   On: 12/22/2021 15:29  ? ?MR ANGIO NECK WO CONTRAST ? ?Result Date: 12/22/2021 ?CLINICAL DATA:  Neuro deficit, acute, stroke suspected EXAM: MRA NECK WITHOUT CONTRAST MRA HEAD WITHOUT CONTRAST TECHNIQUE: Angiographic images of the Circle of [place] were acquired using MRA technique without intravenous contrast. COMPARISON:  MRI head from the same day. Carotid ultrasound from the same day. FINDINGS: MRA NECK FINDINGS Artifact limits evaluation, particularly proximally. Within this limitation, no visible high-grade stenosis of either the carotid systems or vertebral arteries. Right vertebral artery is dominant. Also, in plane loss of flow related signal limits evaluation of the V3 vertebral arteries and proximal left vertebral artery. MRA HEAD FINDINGS Anterior circulation: Bilateral intracranial ICAs, MCAs, and ACAs are patent. Probably severe proximal right M2 MCA stenosis per no aneurysm identified. Posterior circulation: Bilateral intradural vertebral arteries, basilar  artery, and posterior cerebral arteries are patent without proximal hemodynamically significant stenosis. Right posterior communicating artery is present. No aneurysm identified. IMPRESSION: MRA head: 1. No emergent large vessel occlusion. 2. Probably severe proximal right M2 MCA stenosis. MRA neck: Limited by artifact without visible high-grade stenosis. Electronically Signed   By: Margaretha Sheffield M.D.   On: 12/22/2021 15:29  ? ?MR BRAIN WO CONTRAST ? ?Result Date: 12/22/2021 ?CLINICAL DATA:  Initial evaluation for acute syncope/presyncope, CVA suspected. EXAM: MRI HEAD WITHOUT CONTRAST TECHNIQUE: Multiplanar, multiecho pulse sequences of the brain and surrounding structures were obtained without intravenous contrast. COMPARISON:  None available. FINDINGS: Brain: Cerebral volume within normal limits for age. Hazy  T2/FLAIR hyperintensity involving the periventricular white matter most consistent with chronic small vessel ischemic disease, mild for age. Few small remote lacunar infarcts present about the right basal ganglia, right corona radiata, and right thalamus Punctate focus of diffusion abnormality involving the high posterior left frontal cortex, consistent with an acute ischemic nonhemorrhagic infarct (series 5, image 42). Additional punctate acute ischemic cortical infarct noted at the parasagittal right parieto-occipital region (series 5, image 29). Punctate ischemic nonhemorrhagic infarct present at the left periatrial white matter (series 5, image 22). Additional punctate ischemic infarct noted at the subcortical left temporal lobe (series 5, image 15). Findings presumably embolic in nature. Otherwise, gray-white matter differentiation well maintained. No other areas of chronic cortical infarction. No acute or chronic intracranial blood products. No mass lesion, midline shift or mass effect no hydrocephalus or extra-axial fluid collection. Pituitary gland suprasellar region within normal limits.  Vascular: Major intracranial vascular flow voids are maintained. Skull and upper cervical spine: Craniocervical junction within normal limits. Bone marrow signal intensity normal. No scalp soft tissue abnormality. Sinuses/Or

## 2021-12-23 NOTE — Progress Notes (Signed)
? ? ? ?Progress Note  ? ? Andrew Pruitt  OEH:[id number] DOB: 12-05-1955  DOA: 12/21/2021 ?PCP: Jon Billings, NP  ? ? ? ? ?Brief Narrative:  ? ? ?Medical records reviewed and are as summarized below: ? ?Andrew Pruitt is a 66 y.o. male with medical history significant for CAD with recent STEMI s/p DES to LAD on 12/19/2021, hyperlipidemia, poorly controlled insulin-dependent diabetes mellitus, chronic systolic and diastolic CHF, CKD stage IIIb, dilated aortic root, arthritis, depression, left foot wound, OSA on CPAP intermittently at home.  He was discharged from the hospital on 12/21/2021.  However, he said that he only took about 10 steps from his truck and his legs buckled and he fell before he could reach his house.  He fell on a piece of wood on his left side.  He complained of severe left-sided pleuritic chest pain.  He returned to the emergency room for further evaluation. ? ?He was found to have left-sided 5th-10th rib fractures and acute stroke. ? ? ? ? ?Assessment/Plan:  ? ?Principal Problem: ?  CVA (cerebral vascular accident) ([place]) ?Active Problems: ?  Fall ?  Low blood pressure ?  STEMI involving left anterior descending coronary artery ([place]) ?  SIRS (systemic inflammatory response syndrome) ([facility]) ?  Type 2 diabetes mellitus with proliferative diabetic retinopathy of left eye without macular edema ([facility]) ?  Diabetic ulcer of left foot ([place]) ?  Absent pedal pulses ?  Hyperlipidemia associated with type 2 diabetes mellitus ([place]) ?  Elevated LFTs ?  OSA (obstructive sleep apnea) ?  Hyponatremia ?  Rib fractures ?  Stage 3b chronic kidney disease (CKD) ([place]) ? ? ? ?Acute stroke: Appreciate input from neurologist.  Continue Lipitor and DAPT (aspirin and Brilinta).  He has been encouraged to work with PT and OT.  ? ?S/p fall leading to left-sided 5th-10th rib fractures: Use IV Dilaudid, Tylenol and oxycodone as needed for pain.  PT and OT evaluation.. ? ?Orthostatic hypotension: Repeat orthostatic vitals has  not been done. ? ?Recent STEMI s/p DES to LAD on 12/19/2021.  Continue aspirin, Brilinta and Lipitor. ? ?AKI on CKD stage IIIb: Creatinine is down from 2.2 to 1.67 ? ?Chronic systolic and diastolic CHF: Continue carvedilol.  2D echo on 12/20/2021 showed EF estimated at 35 to 03%, grade 1 diastolic dysfunction, mild dilation of aortic root. ? ?Insulin-dependent type 2 diabetes mellitus with hyperglycemia: Hemoglobin A1c was 11.  Continue Levemir and NovoLog as needed. ? ?Diabetic left foot ulcer in the interdigital space between the left big and second toes.  He was seen by Dr. Guy Sandifer, podiatrist, on 12/20/2021.  Continue local wound care with Betadine dressing. ? ?Nocturnal hypoxemia: Improved. ?He used CPAP briefly last night because he could not tolerate it ? ?Other comorbidities include hypertension, hyperlipidemia, OSA, CKD stage IIIb  ? ? ? ? ?Diet Order   ? ?       ?  Diet Carb Modified Fluid consistency: Thin; Room service appropriate? Yes  Diet effective now       ?  ? ?  ?  ? ?  ? ? ? ? ?Consultants: ?Neurologist ?Cardiologist ? ?Procedures: ?None ? ? ? ?Medications:  ? ? aspirin  81 mg Oral Daily  ? atorvastatin  80 mg Oral Daily  ? carvedilol  3.125 mg Oral BID WC  ? docusate sodium  100 mg Oral BID  ? fenofibrate  160 mg Oral Daily  ? gabapentin  100 mg Oral TID  ? heparin  5,000 Units  Subcutaneous Q12H  ? insulin aspart  0-15 Units Subcutaneous TID WC  ? insulin detemir  20 Units Subcutaneous BID  ? sodium chloride flush  3 mL Intravenous Q12H  ? ticagrelor  90 mg Oral BID  ? venlafaxine XR  75 mg Oral Q breakfast  ? ?Continuous Infusions: ? ? ? ? ?Anti-infectives (From admission, onward)  ? ? None  ? ?  ? ? ? ? ? ? ? ? ? ?Family Communication/Anticipated D/C date and plan/Code Status  ? ?DVT prophylaxis: heparin injection 5,000 Units Start: 12/22/21 0130 ? ? ?  Code Status: Full Code ? ?Family Communication: None ?Disposition Plan: Plan to discharge home versus SNF ? ? ?Status is: Observation ?The  patient will require care spanning > 2 midnights and should be moved to inpatient because: Acute stroke, uncontrolled pain from her fracture ? ? ? ? ? ? ?Subjective:  ? ?He complains of left-sided pleuritic chest pain.  He has not been able to work with PT or OT because of chest pain. ? ?Objective:  ? ? ?Vitals:  ? 12/22/21 2255 12/23/21 0405 12/23/21 0500 12/23/21 0738  ?BP: 126/78 139/78  (!) 145/75  ?Pulse: 79 86  90  ?Resp: '18 20  18  '$ ?Temp: 99.2 ?F (37.3 ?C) 98.8 ?F (37.1 ?C)  98.2 ?F (36.8 ?C)  ?TempSrc:  Oral  Oral  ?SpO2: 95% 96%  93%  ?Weight:   124 kg   ? ?No data found. ? ? ? ?Intake/Output Summary (Last 24 hours) at 12/23/2021 0952 ?Last data filed at 12/23/2021 0601 ?Gross per 24 hour  ?Intake --  ?Output 2370 ml  ?Net -2370 ml  ? ?Filed Weights  ? 12/23/21 0500  ?Weight: 124 kg  ? ? ?Exam: ? ?GEN: NAD ?SKIN: Abrasion noted on the left elbow.  Small wound under interdigital webspace between the left first and second toes. ?EYES: EOMI ?ENT: MMM ?CV: RRR ?PULM: CTA B ?ABD: soft, obese, NT, +BS ?CNS: AAO x 3, non focal ?EXT: No edema or tenderness ?MSK: Left sided chest wall tenderness ? ? ? ? ? ?  ? ? ?Data Reviewed:  ? ?I have personally reviewed following labs and imaging studies: ? ?Labs: ?Labs show the following:  ? ?Basic Metabolic Panel: ?Recent Labs  ?Lab 12/20/21 ?0207 12/21/21 ?4696 12/21/21 ?2056 12/22/21 ?0505 12/23/21 ?2952  ?NA 136 138 133* 134* 132*  ?K 3.8 3.8 3.9 3.9 3.3*  ?CL 99 98 95* 95* 95*  ?CO2 '25 28 25 25 24  '$ ?GLUCOSE 450* 246* 224* 229* 119*  ?BUN 31* 39* 45* 54* 48*  ?CREATININE 1.92* 2.08* 2.19* 2.21* 1.67*  ?CALCIUM 8.8* 9.1 9.1 8.9 8.3*  ? ?GFR ?Estimated Creatinine Clearance: 59.2 mL/min (A) (by C-G formula based on SCr of 1.67 mg/dL (H)). ?Liver Function Tests: ?Recent Labs  ?Lab 12/19/21 ?2054 12/21/21 ?2056 12/22/21 ?0505  ?AST 28 91* 69*  ?ALT 53* 68* 65*  ?ALKPHOS 52 50 51  ?BILITOT 0.7 2.0* 2.0*  ?PROT 7.2 7.5 7.5  ?ALBUMIN 3.6 3.5 3.6  ? ?Recent Labs  ?Lab 12/21/21 ?2056   ?LIPASE 28  ? ?No results for input(s): AMMONIA in the last 168 hours. ?Coagulation profile ?Recent Labs  ?Lab 12/19/21 ?2054  ?INR 0.9  ? ? ?CBC: ?Recent Labs  ?Lab 12/19/21 ?2054 12/20/21 ?0207 12/21/21 ?2056  ?WBC 7.3 10.6* 15.8*  ?NEUTROABS 3.9  --  12.6*  ?HGB 15.6 16.3 15.5  ?HCT 45.3 47.5 46.6  ?MCV 88.0 87.8 90.3  ?PLT 306 318 280  ? ?  Cardiac Enzymes: ?Recent Labs  ?Lab 12/22/21 ?0505 12/23/21 ?0436  ?CKTOTAL 556* 244  ? ?BNP (last 3 results) ?No results for input(s): PROBNP in the last 8760 hours. ?CBG: ?Recent Labs  ?Lab 12/22/21 ?1137 12/22/21 ?1633 12/22/21 ?2033 12/23/21 ?0411 12/23/21 ?0739  ?GLUCAP 199* 151* 126* 144* 138*  ? ?D-Dimer: ?No results for input(s): DDIMER in the last 72 hours. ?Hgb A1c: ?No results for input(s): HGBA1C in the last 72 hours. ? ?Lipid Profile: ?No results for input(s): CHOL, HDL, LDLCALC, TRIG, CHOLHDL, LDLDIRECT in the last 72 hours. ? ?Thyroid function studies: ?No results for input(s): TSH, T4TOTAL, T3FREE, THYROIDAB in the last 72 hours. ? ?Invalid input(s): FREET3 ?Anemia work up: ?No results for input(s): VITAMINB12, FOLATE, FERRITIN, TIBC, IRON, RETICCTPCT in the last 72 hours. ?Sepsis Labs: ?Recent Labs  ?Lab 12/19/21 ?2054 12/20/21 ?0207 12/21/21 ?2056 12/22/21 ?1342 12/23/21 ?0436  ?WBC 7.3 10.6* 15.8*  --   --   ?LATICACIDVEN  --   --   --  2.9* 1.5  ? ? ?Microbiology ?Recent Results (from the past 240 hour(s))  ?Resp Panel by RT-PCR (Flu A&B, Covid) Nasopharyngeal Swab     Status: None  ? Collection Time: 12/19/21  9:02 PM  ? Specimen: Nasopharyngeal Swab; Nasopharyngeal(NP) swabs in vial transport medium  ?Result Value Ref Range Status  ? SARS Coronavirus 2 by RT PCR NEGATIVE NEGATIVE Final  ?  Comment: (NOTE) ?SARS-CoV-2 target nucleic acids are NOT DETECTED. ? ?The SARS-CoV-2 RNA is generally detectable in upper respiratory ?specimens during the acute phase of infection. The lowest ?concentration of SARS-CoV-2 viral copies this assay can detect is ?138  copies/mL. A negative result does not preclude SARS-Cov-2 ?infection and should not be used as the sole basis for treatment or ?other patient management decisions. A negative result may occur with  ?improper sp

## 2021-12-24 DIAGNOSIS — S2242XA Multiple fractures of ribs, left side, initial encounter for closed fracture: Secondary | ICD-10-CM | POA: Diagnosis not present

## 2021-12-24 DIAGNOSIS — I639 Cerebral infarction, unspecified: Secondary | ICD-10-CM | POA: Diagnosis not present

## 2021-12-24 LAB — GLUCOSE, CAPILLARY
Glucose-Capillary: 124 mg/dL — ABNORMAL HIGH (ref 70–99)
Glucose-Capillary: 113 mg/dL — ABNORMAL HIGH (ref 70–99)
Glucose-Capillary: 195 mg/dL — ABNORMAL HIGH (ref 70–99)
Glucose-Capillary: 188 mg/dL — ABNORMAL HIGH (ref 70–99)
Glucose-Capillary: 144 mg/dL — ABNORMAL HIGH (ref 70–99)

## 2021-12-24 LAB — BASIC METABOLIC PANEL
Anion gap: 13 (ref 5–15)
BUN: 42 mg/dL — ABNORMAL HIGH (ref 8–23)
CO2: 25 mmol/L (ref 22–32)
Calcium: 8.4 mg/dL — ABNORMAL LOW (ref 8.9–10.3)
Chloride: 92 mmol/L — ABNORMAL LOW (ref 98–111)
Creatinine, Ser: 1.46 mg/dL — ABNORMAL HIGH (ref 0.61–1.24)
GFR, Estimated: 53 mL/min — ABNORMAL LOW (ref >60)
Glucose, Bld: 213 mg/dL — ABNORMAL HIGH (ref 70–99)
Potassium: 3.8 mmol/L (ref 3.5–5.1)
Sodium: 130 mmol/L — ABNORMAL LOW (ref 135–145)

## 2021-12-24 LAB — MAGNESIUM: Magnesium: 2.6 mg/dL — ABNORMAL HIGH (ref 1.7–2.4)

## 2021-12-24 MED ORDER — POTASSIUM CHLORIDE CRYS ER 20 MEQ PO TBCR
40.0000 meq | EXTENDED_RELEASE_TABLET | Freq: Once | ORAL | Status: AC
  Administered 2021-12-24: 40 meq via ORAL
  Filled 2021-12-24: qty 2

## 2021-12-24 NOTE — Progress Notes (Signed)
? ?Progress Note ? ?Patient Name: Andrew Pruitt ?Date of Encounter: 12/24/2021 ? ?[facility] Cardiologist: Nelva Bush, MD  ? ?Subjective  ? ?Reports having significant pain left ribs ?Requesting Dilaudid ?Limited mobility on working with PT despite premedication for session ?Requiring moderate assist with bed mobility ?Some dizziness on standing but blood pressure within normal limits ?Noted to be in a cold sweat, ?Sleeping 3 hours at a time ? ?Inpatient Medications  ?  ?Scheduled Meds: ? aspirin  81 mg Oral Daily  ? atorvastatin  80 mg Oral Daily  ? carvedilol  3.125 mg Oral BID WC  ? docusate sodium  100 mg Oral BID  ? fenofibrate  160 mg Oral Daily  ? gabapentin  100 mg Oral TID  ? heparin  5,000 Units Subcutaneous Q12H  ? insulin aspart  0-15 Units Subcutaneous TID WC  ? insulin detemir  20 Units Subcutaneous BID  ? sodium chloride flush  3 mL Intravenous Q12H  ? ticagrelor  90 mg Oral BID  ? venlafaxine XR  75 mg Oral Q breakfast  ? ?Continuous Infusions: ? ?PRN Meds: ?acetaminophen **OR** acetaminophen, bisacodyl, hydrALAZINE, HYDROmorphone (DILAUDID) injection, ondansetron **OR** ondansetron (ZOFRAN) IV, oxyCODONE, polyethylene glycol, sodium phosphate  ? ?Vital Signs  ?  ?Vitals:  ? 12/24/21 0400 12/24/21 0405 12/24/21 0743 12/24/21 1133  ?BP: (!) 146/86  (!) 150/97 (!) 141/87  ?Pulse: 78  85 85  ?Resp: (!) '22  18 20  '$ ?Temp: 98.3 ?F (36.8 ?C)  98.1 ?F (36.7 ?C) 98.6 ?F (37 ?C)  ?TempSrc: Axillary     ?SpO2: 97%  94% 94%  ?Weight:  124.6 kg    ? ? ?Intake/Output Summary (Last 24 hours) at 12/24/2021 1134 ?Last data filed at 12/24/2021 0355 ?Gross per 24 hour  ?Intake --  ?Output 1500 ml  ?Net -1500 ml  ? ? ?  12/24/2021  ?  4:05 AM 12/23/2021  ?  5:00 AM 12/19/2021  ? 10:30 PM  ?Last 3 Weights  ?Weight (lbs) 274 lb 11.1 oz 273 lb 5.9 oz 284 lb 2.8 oz  ?Weight (kg) 124.6 kg 124 kg 128.9 kg  ?   ? ?Telemetry  ?  ?Normal sinus rhythm- Personally Reviewed ? ?ECG  ?  ? - Personally Reviewed ? ?Physical Exam   ? ?GEN: No acute distress.   ?Neck: No JVD ?Cardiac: RRR, no murmurs, rubs, or gallops.  ?Respiratory: Clear to auscultation bilaterally. ?GI: Soft, nontender, non-distended  ?MS: No edema; No deformity.  Pain left flank ?Neuro:  Nonfocal  ?Psych: Normal affect  ? ?Labs  ?  ?High Sensitivity Troponin:   ?Recent Labs  ?Lab 12/19/21 ?2054 12/19/21 ?2325 12/21/21 ?2056 12/23/21 ?0806  ?TROPONINIHS 11 9,806* >24,000* 23,416*  ?   ?Chemistry ?Recent Labs  ?Lab 12/19/21 ?2054 12/20/21 ?0207 12/21/21 ?2056 12/22/21 ?0505 12/23/21 ?2703  ?NA 134*   < > 133* 134* 132*  ?K 4.0   < > 3.9 3.9 3.3*  ?CL 97*   < > 95* 95* 95*  ?CO2 26   < > '25 25 24  '$ ?GLUCOSE 472*   < > 224* 229* 119*  ?BUN 27*   < > 45* 54* 48*  ?CREATININE 2.09*   < > 2.19* 2.21* 1.67*  ?CALCIUM 9.0   < > 9.1 8.9 8.3*  ?PROT 7.2  --  7.5 7.5  --   ?ALBUMIN 3.6  --  3.5 3.6  --   ?AST 28  --  91* 69*  --   ?ALT 53*  --  68* 65*  --   ?ALKPHOS 52  --  50 51  --   ?BILITOT 0.7  --  2.0* 2.0*  --   ?GFRNONAA 34*   < > 32* 32* 45*  ?ANIONGAP 11   < > '13 14 13  '$ ? < > = values in this interval not displayed.  ?  ?Lipids  ?Recent Labs  ?Lab 12/19/21 ?2054  ?CHOL 239*  ?TRIG 711*  ?HDL 26*  ?LDLCALC UNABLE TO CALCULATE IF TRIGLYCERIDE OVER 400 mg/dL  ?CHOLHDL 9.2  ?  ?Hematology ?Recent Labs  ?Lab 12/19/21 ?2054 12/20/21 ?0207 12/21/21 ?2056  ?WBC 7.3 10.6* 15.8*  ?RBC 5.15 5.41 5.16  ?HGB 15.6 16.3 15.5  ?HCT 45.3 47.5 46.6  ?MCV 88.0 87.8 90.3  ?MCH 30.3 30.1 30.0  ?MCHC 34.4 34.3 33.3  ?RDW 13.2 13.2 13.6  ?PLT 306 318 280  ? ?Thyroid No results for input(s): TSH, FREET4 in the last 168 hours.  ?BNPNo results for input(s): BNP, PROBNP in the last 168 hours.  ?DDimer No results for input(s): DDIMER in the last 168 hours.  ? ?Radiology  ?  ?MR ANGIO HEAD WO CONTRAST ? ?Result Date: 12/22/2021 ?CLINICAL DATA:  Neuro deficit, acute, stroke suspected EXAM: MRA NECK WITHOUT CONTRAST MRA HEAD WITHOUT CONTRAST TECHNIQUE: Angiographic images of the Circle of [place] were  acquired using MRA technique without intravenous contrast. COMPARISON:  MRI head from the same day. Carotid ultrasound from the same day. FINDINGS: MRA NECK FINDINGS Artifact limits evaluation, particularly proximally. Within this limitation, no visible high-grade stenosis of either the carotid systems or vertebral arteries. Right vertebral artery is dominant. Also, in plane loss of flow related signal limits evaluation of the V3 vertebral arteries and proximal left vertebral artery. MRA HEAD FINDINGS Anterior circulation: Bilateral intracranial ICAs, MCAs, and ACAs are patent. Probably severe proximal right M2 MCA stenosis per no aneurysm identified. Posterior circulation: Bilateral intradural vertebral arteries, basilar artery, and posterior cerebral arteries are patent without proximal hemodynamically significant stenosis. Right posterior communicating artery is present. No aneurysm identified. IMPRESSION: MRA head: 1. No emergent large vessel occlusion. 2. Probably severe proximal right M2 MCA stenosis. MRA neck: Limited by artifact without visible high-grade stenosis. Electronically Signed   By: Margaretha Sheffield M.D.   On: 12/22/2021 15:29  ? ?MR ANGIO NECK WO CONTRAST ? ?Result Date: 12/22/2021 ?CLINICAL DATA:  Neuro deficit, acute, stroke suspected EXAM: MRA NECK WITHOUT CONTRAST MRA HEAD WITHOUT CONTRAST TECHNIQUE: Angiographic images of the Circle of Willis were acquired using MRA technique without intravenous contrast. COMPARISON:  MRI head from the same day. Carotid ultrasound from the same day. FINDINGS: MRA NECK FINDINGS Artifact limits evaluation, particularly proximally. Within this limitation, no visible high-grade stenosis of either the carotid systems or vertebral arteries. Right vertebral artery is dominant. Also, in plane loss of flow related signal limits evaluation of the V3 vertebral arteries and proximal left vertebral artery. MRA HEAD FINDINGS Anterior circulation: Bilateral intracranial  ICAs, MCAs, and ACAs are patent. Probably severe proximal right M2 MCA stenosis per no aneurysm identified. Posterior circulation: Bilateral intradural vertebral arteries, basilar artery, and posterior cerebral arteries are patent without proximal hemodynamically significant stenosis. Right posterior communicating artery is present. No aneurysm identified. IMPRESSION: MRA head: 1. No emergent large vessel occlusion. 2. Probably severe proximal right M2 MCA stenosis. MRA neck: Limited by artifact without visible high-grade stenosis. Electronically Signed   By: Margaretha Sheffield M.D.   On: 12/22/2021 15:29  ? ?US Carotid Bilateral ? ?Result Date:  12/22/2021 ?CLINICAL DATA:  66 year old male with a history of stroke-like symptoms EXAM: BILATERAL CAROTID DUPLEX ULTRASOUND TECHNIQUE: Pearline Cables scale imaging, color Doppler and duplex ultrasound were performed of bilateral carotid and vertebral arteries in the neck. COMPARISON:  None. FINDINGS: Criteria: Quantification of carotid stenosis is based on velocity parameters that correlate the residual internal carotid diameter with NASCET-based stenosis levels, using the diameter of the distal internal carotid lumen as the denominator for stenosis measurement. The following velocity measurements were obtained: RIGHT ICA:  Systolic 82 cm/sec, Diastolic 24 cm/sec CCA:  54 cm/sec SYSTOLIC ICA/CCA RATIO:  1.5 ECA:  76 cm/sec LEFT ICA:  Systolic 60 cm/sec, Diastolic 23 cm/sec CCA:  62 cm/sec SYSTOLIC ICA/CCA RATIO:  1.0 ECA:  73 cm/sec Right Brachial SBP: Not acquired Left Brachial SBP: Not acquired RIGHT CAROTID ARTERY: No significant calcifications of the right common carotid artery. Intermediate waveform maintained. Heterogeneous and partially calcified plaque at the right carotid bifurcation. No significant lumen shadowing. Low resistance waveform of the right ICA. No significant tortuosity. RIGHT VERTEBRAL ARTERY: Antegrade flow with low resistance waveform. LEFT CAROTID ARTERY: No  significant calcifications of the left common carotid artery. Intermediate waveform maintained. Heterogeneous and partially calcified plaque at the left carotid bifurcation without significant lumen shadowing.

## 2021-12-24 NOTE — TOC Progression Note (Signed)
Transition of Care (TOC) - Progression Note  ? ? ?Patient Details  ?Name: Andrew Pruitt ?MRN: [id number] ?Date of Birth: 03-12-1956 ? ?Transition of Care (TOC) CM/SW Contact  ?Raina Mina, LCSWA ?Phone Number: ?12/24/2021, 12:13 PM ? ?Clinical Narrative:   Patient agreed to participate in home health services at discharge. ? ? ? ?Expected Discharge Plan: [place] ?Barriers to Discharge: Continued Medical Work up ? ?Expected Discharge Plan and Services ?Expected Discharge Plan: [place] ?  ?Discharge Planning Services: CM Consult ?  ?Living arrangements for the past 2 months: [place] ?                ?DME Arranged: N/A ?DME Agency: NA ?  ?  ?  ?  ?  ?  ?  ?  ? ? ?Social Determinants of Health (SDOH) Interventions ?  ? ?Readmission Risk Interventions ?   ? View : No data to display.  ?  ?  ?  ? ? ?

## 2021-12-24 NOTE — Discharge Summary (Signed)
?Physician Discharge Summary ?  ?Patient: Andrew Pruitt MRN: [id number] DOB: 04-Apr-1956  ?Admit date:     12/19/2021  ?Discharge date: 12/21/2021  ?Discharge Physician: Max Sane  ? ?PCP: Jon Billings, NP  ? ?Recommendations at discharge:  ? ?Follow-up with outpatient providers as requested ? ?Discharge Diagnoses: ?Principal Problem: ?  STEMI involving left anterior descending coronary artery ([place]) ?Active Problems: ?  Type 2 diabetes mellitus with proliferative diabetic retinopathy of left eye without macular edema (HCC) ?  Diabetic ulcer of left foot ([place]) ?  Hyperlipidemia associated with type 2 diabetes mellitus ([place]) ?  Diabetic neuropathy associated with type 2 diabetes mellitus ([place]) ?  Hypertension associated with diabetes ([place]) ?  Depression, recurrent ([place]) ?  STEMI (ST elevation myocardial infarction) ([place]) ?  Poorly controlled diabetes mellitus ([place]) ?  Obesity (BMI 30-39.9) ? ?Hospital Course: ?[name] is a 66 year old with history of obesity, BMI of 38, hypertension, hyperlipidemia, insulin-dependent diabetes mellitus, poorly controlled, difficulty affording insulin, CKD 3B at baseline, who presents to the emergency department for chief concerns of chest pain. ? ?He was found to have STEMI. ? ?Initial vitals in the emergency department showed temperature of 98.4, respiration rate of 16, heart rate 71, blood pressure 137/88, SPO2 of 97% on room air. ? ?Serum sodium 134, potassium 4.0, chloride 97, bicarb 26, BUN 27, serum creatinine of 2.09, GFR 34, nonfasting blood glucose 472. ? ?WBC was 7.3, hemoglobin 15.6, platelets of 306. ? ?Lipid panel ordered showed cholesterol was 239, HDL 26, triglyceride 711. ? ?Troponin was 11. ? ?EKG in the emergency department showed ST depression in leads II, III and aVF with clear ST elevation in leads II. ? ?Per ED provider note, EMS EKG was concerning for ST elevation in leads V1 to V3.  EMS gave patient aspirin and nitroglycerin. ? ?Code STEMI was  called and patient was taken to the Cath Lab. ? ?Left heart cath showed proximal RCA with 50% stenosis and mid RCA with 80% stenosis, patient is now status post PCI to the RCA. ? ?Patient was loaded on Brilinta and given a dose of Aggrastat. ? ?4/12: transfer to PCU, Podiatry c/s for LE wound ? ?Assessment and Plan: ?* STEMI involving left anterior descending coronary artery ([place]) ?- Status post left heart cath showing 50% stenosis of the proximal RCA and 80% stenosis of the mid RCA ?- Status post PCI to RCA ?- continue Brilanta, b-blocker, asa, statin at discharge ?- Per catheterization, patient's EF was around 30%, patient received a dose of furosemide ?- Continue Farxiga 10 mg daily if he can afford ? ?Hyperlipidemia associated with type 2 diabetes mellitus ([place]) ?- Continue atorvastatin 80 mg daily. ? ?Obesity (BMI 30-39.9) ?- Morbid obesity ?- Recommend patient work with PCP for appropriate diet and activity as tolerated plan in order to encourage healthy weight loss ? ?Poorly controlled diabetes mellitus ([place]) ?- TOC has been consulted for medication assistance as patient endorses difficulty affording insulin.  Outpatient endocrine evaluation at [place] as he is moving there soon ?-Discharged on insulin and [place].  Patient is unable to afford Trulicity ? ?Depression, recurrent ([place]) ?- Venlafaxine 75 mg daily with breakfast. ? ?Hypertension associated with diabetes ([place]) ?-Coreg, losartan ? ?Diabetic neuropathy associated with type 2 diabetes mellitus ([place]) ?Not symptomatic - not on meds ? ? ? ? ?  ? ? ?Consultants: Cardiology, podiatry ?Procedures performed: Cardiac cath status post stenting ?Disposition: Home  ?Diet recommendation:  ?Discharge Diet Orders (From admission, onward)  ? ?  Start     Ordered  ? 12/21/21 0000  Diet - low sodium heart healthy       ? 12/21/21 1153  ? ?  ?  ? ?  ? ?Carb modified diet ?DISCHARGE MEDICATION: ?Allergies as of 12/21/2021   ? ?   Reactions  ? Niaspan [niacin] Other (See  Comments)  ? Red skin, red eyes  ? Trazodone Other (See Comments)  ? nightmares  ? ?  ? ?  ?Medication List  ?  ? ?STOP taking these medications   ? ?amLODipine 5 MG tablet ?Commonly known as: NORVASC ?  ?diclofenac Sodium 1 % Gel ?Commonly known as: Voltaren ?  ?hydrochlorothiazide 25 MG tablet ?Commonly known as: HYDRODIURIL ?  ?insulin NPH Human 100 UNIT/ML injection ?Commonly known as: NOVOLIN N ?  ? ?  ? ?TAKE these medications   ? ?aspirin 81 MG chewable tablet ?Chew 1 tablet (81 mg total) by mouth daily. ?  ?atorvastatin 80 MG tablet ?Commonly known as: LIPITOR ?Take 1 tablet (80 mg total) by mouth daily. ?  ?B-D UF III MINI PEN NEEDLES 31G X 5 MM Misc ?Generic drug: Insulin Pen Needle ?USE DAILY ?  ?carvedilol 3.125 MG tablet ?Commonly known as: COREG ?Take 1 tablet (3.125 mg total) by mouth 2 (two) times daily with a meal. ?  ?dapagliflozin propanediol 10 MG Tabs tablet ?Commonly known as: [place] ?Take 1 tablet (10 mg total) by mouth daily before breakfast. ?  ?fenofibrate 160 MG tablet ?Take 1 tablet (160 mg total) by mouth daily. ?What changed:  ?medication strength ?how much to take ?  ?FreeStyle Libre 14 Day Reader [name] ?Use to check glucose levels TID; E11.9 ?  ?FreeStyle Libre 14 Day Sensor Misc ?Use to check glucose TID. Change every 14 days. E 11.9 ?  ?gabapentin 100 MG capsule ?Commonly known as: NEURONTIN ?Take 1 capsule (100 mg total) by mouth 3 (three) times daily. ?  ?insulin aspart 100 UNIT/ML injection ?Commonly known as: novoLOG ?Inject 4 Units into the skin 3 (three) times daily with meals. ?  ?insulin detemir 100 UNIT/ML FlexPen ?Commonly known as: LEVEMIR ?Inject 20 Units into the skin 2 (two) times daily. ?  ?losartan 25 MG tablet ?Commonly known as: COZAAR ?Take 1 tablet (25 mg total) by mouth daily. ?  ?ticagrelor 90 MG Tabs tablet ?Commonly known as: BRILINTA ?Take 1 tablet (90 mg total) by mouth 2 (two) times daily. ?  ?venlafaxine XR 75 MG 24 hr capsule ?Commonly known as: Effexor  XR ?Take 1 capsule (75 mg total) by mouth daily with breakfast. ?  ? ?  ? ?  ?  ? ? ?  ?Discharge Care Instructions  ?(From admission, onward)  ?  ? ? ?  ? ?  Start     Ordered  ? 12/21/21 0000  Discharge wound care:       ?Comments: As above  ? 12/21/21 1153  ? ?  ?  ? ?  ? ? Follow-up Information   ? ? Theora Gianotti, NP Follow up in 1 week(s).   ?Specialties: Nurse Practitioner, Cardiology, Radiology ?Why: Appointment 12/12/2021 10:55am ?Contact information: ?[address] ?[address] ?[place] [address] ?[phone] ? ? ?  ?  ? ? Jon Billings, NP. Go on 01/18/2022.   ?Specialty: Nurse Practitioner ?Why: 8:40am. ?Contact information: ?[address] ?[place] [address] ?[phone] ? ? ?  ?  ? ? End, Harrell Gave, MD .   ?Specialty: Cardiology ?Contact information: ?[address] ?[address] ?[place] [address] ?[phone] ? ? ?  ?  ? ?  ?  ? ?  ? ?Discharge Exam: ?Filed Weights  ? 12/19/21 2058 12/19/21 2230  ?Weight: (!) 139.5 kg 128.9 kg  ? ?Constitutional: appears age-appropriate, NAD, calm, comfortable ?Eyes: PERRL, lids and conjunctivae normal ?ENMT: Mucous membranes are moist. Posterior pharynx clear of any exudate or lesions. Age-appropriate dentition. Hearing appropriate ?Neck: normal, supple, no masses, no thyromegaly ?Respiratory: clear to auscultation bilaterally, no wheezing, no crackles. Normal respiratory effort. No accessory muscle use.  ?Cardiovascular: Regular rate and rhythm, no murmurs / rubs / gallops. No extremity edema. 2+ pedal pulses. No carotid bruits.  ?Abdomen: Morbidly obese abdomen, no tenderness, no masses palpated, no hepatosplenomegaly. Bowel sounds positive.  ?Musculoskeletal: no clubbing / cyanosis. No joint deformity upper and lower extremities. Good ROM, no contractures, no atrophy. Normal muscle tone.  ?Skin: no rashes, lesions, ulcers. No induration ?Neurologic: Sensation intact. Strength 5/5 in all 4.  ?Psychiatric: Normal judgment and insight.  Alert and oriented x 3. Normal mood.  ? ?Condition at discharge: fair ? ?The results of significant diagnostics from this hospitalization (including imaging, microbiology, ancillary and laboratory) are li

## 2021-12-24 NOTE — Progress Notes (Signed)
Patient refused cpap. He states does not wear unit at home and has pain in chest and side due to rib fxs. Unit pulled from room. Will have RN to call if he wants to wear cpap during admission ?

## 2021-12-24 NOTE — Progress Notes (Signed)
Mobility Specialist - Progress Note ? ? ? 12/24/21 1600  ?Mobility  ?Activity Ambulated with assistance in hallway;Stood at bedside;Transferred from bed to chair;Dangled on edge of bed  ?Level of Assistance Standby assist, set-up cues, supervision of patient - no hands on  ?Assistive Device Front wheel walker  ?Distance Ambulated (ft) 120 ft  ?Activity Response Tolerated well  ?$Mobility charge 1 Mobility  ? ? ? ?During mobility: 93-96HR,  ?Post-mobility: 89 HR,  ? ?Pt sitting in chair upon arrival using RA. Pt completes STS ModI and ambulates 142f with SUPERVISION -- voices 9/10 in upper back area. Pt returns EOB grimacing from pain and completes bed mobility to lay supine with MinA. Pt is left with needs in reach, family at bedside and RN notified. ? ?MMerrily Brittle?Mobility Specialist ?12/24/21, 4:31 PM ? ? ? ?

## 2021-12-24 NOTE — Consult Note (Signed)
[facility] Nurse Consult Note: ?[facility] is consulted for left foot, full thickness wound. Patient saw podiatric medicine (Dr. Posey Pronto) last week as outpatient on 12/19/21.  ? ?Upon admission for an unrelated problem on 12/19/21, [facility] nursing was consulted. I discussed with Dr. Posey Pronto via [place] and he recommended Podiatry continue to follow in house. Dr. Sherryle Lis saw on 12/20/21. ? ?Patient is readmitted on 12/21/21 following a fall.  I will place orders for topical care of the left foot using povidone iodine/betadine solution according to Dr. Maxie Barb note on 12/20/21.  ? ?I have recommended to Dr. Mal Misty via [place] that Podiatric Medicine be reconsulted for any further needs for the left foot. ? ?[facility] nursing team will not follow, but will remain available to this patient, the nursing and medical teams.  Please re-consult if needed. ?Thanks, ?Maudie Flakes, MSN, RN, [place], [place], CWON-AP, [place]  ?Pager# [phone]  ? ? ?  ?

## 2021-12-24 NOTE — Plan of Care (Signed)

## 2021-12-24 NOTE — Progress Notes (Signed)
? ? ? ?Progress Note  ? ? Andrew Pruitt  GYB:[id number] DOB: July 31, 1956  DOA: 12/21/2021 ?PCP: Jon Billings, NP  ? ? ? ? ?Brief Narrative:  ? ? ?Medical records reviewed and are as summarized below: ? ?Andrew Pruitt is a 66 y.o. male with medical history significant for CAD with recent STEMI s/p DES to LAD on 12/19/2021, hyperlipidemia, poorly controlled insulin-dependent diabetes mellitus, chronic systolic and diastolic CHF, CKD stage IIIb, dilated aortic root, arthritis, depression, left foot wound, OSA on CPAP intermittently at home.  He was discharged from the hospital on 12/21/2021.  However, he said that he only took about 10 steps from his truck and his legs buckled and he fell before he could reach his house.  He fell on a piece of wood on his left side.  He complained of severe left-sided pleuritic chest pain.  He returned to the emergency room for further evaluation. ? ?He was found to have left-sided 5th-10th rib fractures and acute stroke. ? ? ? ? ?Assessment/Plan:  ? ?Principal Problem: ?  CVA (cerebral vascular accident) ([place]) ?Active Problems: ?  Fall ?  Low blood pressure ?  STEMI involving left anterior descending coronary artery ([place]) ?  SIRS (systemic inflammatory response syndrome) (HCC) ?  Type 2 diabetes mellitus with proliferative diabetic retinopathy of left eye without macular edema (HCC) ?  Diabetic ulcer of left foot ([place]) ?  Absent pedal pulses ?  Hyperlipidemia associated with type 2 diabetes mellitus ([place]) ?  Elevated LFTs ?  OSA (obstructive sleep apnea) ?  Hyponatremia ?  Rib fractures ?  Stage 3b chronic kidney disease (CKD) ([place]) ? ? ? ?Acute stroke: Appreciate input from neurologist.  Continue Lipitor and DAPT (aspirin and Brilinta).   ? ?S/p fall leading to left-sided 5th-10th rib fractures: Continue analgesics as needed for pain.  Continue PT and OT.  Home health therapy recommended. ? ?Orthostatic hypotension: Repeat orthostatic vitals has not been done. ? ?Recent STEMI s/p  DES to LAD on 12/19/2021.  Continue aspirin, Brilinta and Lipitor. ? ?AKI on CKD stage IIIb: Repeat BMP  ? ?Hypokalemia: Replete potassium and monitor level ? ?Chronic systolic and diastolic CHF: Continue carvedilol.  2D echo on 12/20/2021 showed EF estimated at 35 to 87%, grade 1 diastolic dysfunction, mild dilation of aortic root. ? ?Insulin-dependent type 2 diabetes mellitus with hyperglycemia: Hemoglobin A1c was 11.  Continue Levemir and NovoLog as needed. ? ?Diabetic left foot ulcer in the interdigital space between the left big and second toes.  He was seen by Dr. Guy Sandifer, podiatrist, on 12/20/2021.  Continue local wound care with Betadine dressing. ? ?Nocturnal hypoxemia: Improved. ?He used CPAP briefly last night because he could not tolerate it ? ?Other comorbidities include hypertension, hyperlipidemia, OSA, CKD stage IIIb  ? ? ? ? ?Diet Order   ? ?       ?  Diet Carb Modified Fluid consistency: Thin; Room service appropriate? Yes  Diet effective now       ?  ? ?  ?  ? ?  ? ? ? ? ?Consultants: ?Neurologist ?Cardiologist ? ?Procedures: ?None ? ? ? ?Medications:  ? ? aspirin  81 mg Oral Daily  ? atorvastatin  80 mg Oral Daily  ? carvedilol  3.125 mg Oral BID WC  ? docusate sodium  100 mg Oral BID  ? fenofibrate  160 mg Oral Daily  ? gabapentin  100 mg Oral TID  ? heparin  5,000 Units Subcutaneous Q12H  ? insulin aspart  0-15 Units Subcutaneous TID WC  ? insulin detemir  20 Units Subcutaneous BID  ? sodium chloride flush  3 mL Intravenous Q12H  ? ticagrelor  90 mg Oral BID  ? venlafaxine XR  75 mg Oral Q breakfast  ? ?Continuous Infusions: ? ? ? ? ?Anti-infectives (From admission, onward)  ? ? None  ? ?  ? ? ? ? ? ? ? ? ? ?Family Communication/Anticipated D/C date and plan/Code Status  ? ?DVT prophylaxis: heparin injection 5,000 Units Start: 12/22/21 0130 ? ? ?  Code Status: Full Code ? ?Family Communication: None ?Disposition Plan: Plan to discharge home with home health therapy ? ? ?Status is:  Inpatient ?Remains inpatient appropriate because: Uncontrolled chest pain, unsafe discharge plan, too weak to go home today ? ? ? ? ? ? ? ?Subjective:  ? ?Interval events noted.  He complains of left-sided pleuritic chest pain.  He was able to get up with PT and sitting in the chair.  However, he said he felt dizzy while working with PT. ? ?Objective:  ? ? ?Vitals:  ? 12/24/21 0400 12/24/21 0405 12/24/21 0743 12/24/21 1133  ?BP: (!) 146/86  (!) 150/97 (!) 141/87  ?Pulse: 78  85 85  ?Resp: (!) '22  18 20  '$ ?Temp: 98.3 ?F (36.8 ?C)  98.1 ?F (36.7 ?C) 98.6 ?F (37 ?C)  ?TempSrc: Axillary     ?SpO2: 97%  94% 94%  ?Weight:  124.6 kg    ? ?Orthostatic VS for the past 24 hrs: ? BP- Sitting  ?12/23/21 1244 145/89  ? ? ? ? ?Intake/Output Summary (Last 24 hours) at 12/24/2021 1229 ?Last data filed at 12/24/2021 0355 ?Gross per 24 hour  ?Intake --  ?Output 1500 ml  ?Net -1500 ml  ? ?Filed Weights  ? 12/23/21 0500 12/24/21 0405  ?Weight: 124 kg 124.6 kg  ? ? ?Exam: ? ?GEN: NAD ?SKIN: Abrasion on left elbow.  Small wound between the left first and second proximal ?EYES: No pallor or icterus ?ENT: MMM ?CV: RRR ?PULM: CTA B ?ABD: soft, ND, NT, +BS ?CNS: AAO x 3, non focal ?EXT: No edema or tenderness ?MSK: Left-sided chest wall tenderness ? ? ? ? ? ? ?  ? ? ?Data Reviewed:  ? ?I have personally reviewed following labs and imaging studies: ? ?Labs: ?Labs show the following:  ? ?Basic Metabolic Panel: ?Recent Labs  ?Lab 12/20/21 ?0207 12/21/21 ?8588 12/21/21 ?2056 12/22/21 ?0505 12/23/21 ?5027  ?NA 136 138 133* 134* 132*  ?K 3.8 3.8 3.9 3.9 3.3*  ?CL 99 98 95* 95* 95*  ?CO2 '25 28 25 25 24  '$ ?GLUCOSE 450* 246* 224* 229* 119*  ?BUN 31* 39* 45* 54* 48*  ?CREATININE 1.92* 2.08* 2.19* 2.21* 1.67*  ?CALCIUM 8.8* 9.1 9.1 8.9 8.3*  ? ?GFR ?Estimated Creatinine Clearance: 59.3 mL/min (A) (by C-G formula based on SCr of 1.67 mg/dL (H)). ?Liver Function Tests: ?Recent Labs  ?Lab 12/19/21 ?2054 12/21/21 ?2056 12/22/21 ?0505  ?AST 28 91* 69*  ?ALT 53*  68* 65*  ?ALKPHOS 52 50 51  ?BILITOT 0.7 2.0* 2.0*  ?PROT 7.2 7.5 7.5  ?ALBUMIN 3.6 3.5 3.6  ? ?Recent Labs  ?Lab 12/21/21 ?2056  ?LIPASE 28  ? ?No results for input(s): AMMONIA in the last 168 hours. ?Coagulation profile ?Recent Labs  ?Lab 12/19/21 ?2054  ?INR 0.9  ? ? ?CBC: ?Recent Labs  ?Lab 12/19/21 ?2054 12/20/21 ?0207 12/21/21 ?2056  ?WBC 7.3 10.6* 15.8*  ?NEUTROABS 3.9  --  12.6*  ?  HGB 15.6 16.3 15.5  ?HCT 45.3 47.5 46.6  ?MCV 88.0 87.8 90.3  ?PLT 306 318 280  ? ?Cardiac Enzymes: ?Recent Labs  ?Lab 12/22/21 ?0505 12/23/21 ?0436  ?CKTOTAL 556* 244  ? ?BNP (last 3 results) ?No results for input(s): PROBNP in the last 8760 hours. ?CBG: ?Recent Labs  ?Lab 12/23/21 ?1628 12/23/21 ?2013 12/24/21 ?0606 12/24/21 ?0725 12/24/21 ?1134  ?GLUCAP 146* 119* 124* 113* 195*  ? ?D-Dimer: ?No results for input(s): DDIMER in the last 72 hours. ?Hgb A1c: ?No results for input(s): HGBA1C in the last 72 hours. ? ?Lipid Profile: ?No results for input(s): CHOL, HDL, LDLCALC, TRIG, CHOLHDL, LDLDIRECT in the last 72 hours. ? ?Thyroid function studies: ?No results for input(s): TSH, T4TOTAL, T3FREE, THYROIDAB in the last 72 hours. ? ?Invalid input(s): FREET3 ?Anemia work up: ?No results for input(s): VITAMINB12, FOLATE, FERRITIN, TIBC, IRON, RETICCTPCT in the last 72 hours. ?Sepsis Labs: ?Recent Labs  ?Lab 12/19/21 ?2054 12/20/21 ?0207 12/21/21 ?2056 12/22/21 ?1342 12/23/21 ?0436  ?WBC 7.3 10.6* 15.8*  --   --   ?LATICACIDVEN  --   --   --  2.9* 1.5  ? ? ?Microbiology ?Recent Results (from the past 240 hour(s))  ?Resp Panel by RT-PCR (Flu A&B, Covid) Nasopharyngeal Swab     Status: None  ? Collection Time: 12/19/21  9:02 PM  ? Specimen: Nasopharyngeal Swab; Nasopharyngeal(NP) swabs in vial transport medium  ?Result Value Ref Range Status  ? SARS Coronavirus 2 by RT PCR NEGATIVE NEGATIVE Final  ?  Comment: (NOTE) ?SARS-CoV-2 target nucleic acids are NOT DETECTED. ? ?The SARS-CoV-2 RNA is generally detectable in upper  respiratory ?specimens during the acute phase of infection. The lowest ?concentration of SARS-CoV-2 viral copies this assay can detect is ?138 copies/mL. A negative result does not preclude SARS-Cov-2 ?infection and should not be use

## 2021-12-25 ENCOUNTER — Other Ambulatory Visit: Payer: Self-pay

## 2021-12-25 ENCOUNTER — Telehealth: Payer: Self-pay

## 2021-12-25 ENCOUNTER — Telehealth: Payer: Medicare Other

## 2021-12-25 ENCOUNTER — Inpatient Hospital Stay (INDEPENDENT_AMBULATORY_CARE_PROVIDER_SITE_OTHER): Payer: Medicare Other

## 2021-12-25 DIAGNOSIS — I639 Cerebral infarction, unspecified: Secondary | ICD-10-CM

## 2021-12-25 LAB — GLUCOSE, CAPILLARY
Glucose-Capillary: 104 mg/dL — ABNORMAL HIGH (ref 70–99)
Glucose-Capillary: 145 mg/dL — ABNORMAL HIGH (ref 70–99)

## 2021-12-25 MED ORDER — BISACODYL 10 MG RE SUPP
10.0000 mg | Freq: Once | RECTAL | Status: AC
  Administered 2021-12-25: 10 mg via RECTAL
  Filled 2021-12-25: qty 1

## 2021-12-25 MED ORDER — OXYCODONE HCL 5 MG PO TABS
5.0000 mg | ORAL_TABLET | Freq: Three times a day (TID) | ORAL | 0 refills | Status: AC | PRN
Start: 2021-12-25 — End: ?

## 2021-12-25 MED ORDER — ACETAMINOPHEN 325 MG PO TABS
650.0000 mg | ORAL_TABLET | Freq: Four times a day (QID) | ORAL | Status: AC | PRN
Start: 2021-12-25 — End: ?

## 2021-12-25 NOTE — Progress Notes (Signed)
Occupational Therapy Treatment ?Patient Details ?Name: Andrew Pruitt ?MRN: [id number] ?DOB: 02-07-1956 ?Today's Date: 12/25/2021 ? ? ?History of present illness Pt is a 66 y/o M with recent STEMI s/p DES to LAD on 12/19/21 & was d/c on 12/21/21 but reports he only took about 10 steps from his truck & his legs buckled & he fell on a peice of wood on his L side before he could reach his house. Pt returned to the ER for further evaluation. Pt was found to have L sided 5-10 rib fx & acute stroke. MRI brain showed acute ischemic infarcts, punctate, bilateral, c/w central embolic source. PMH: CAD, HLD, poorly controlled IDDM, chronic systolic & diastolic CHF, CKD 3B, dilated aortic root, arthritis, depression, L food wound, OSA on CPAP ?  ?OT comments ? Andrew Pruitt was seen for OT treatment on this date. Upon arrival to room pt reclined in bed, agreeable to tx. Pt requires MIN A exit bed - limited by pain and reports plan to sleep in recliner chair. MOD A don pants/underwear sit<>stand - assist provided from wife at bed side. SBA sit<>stand. Pt making good progress toward goals. Pt continues to benefit from skilled OT services to maximize return to PLOF and minimize risk of future falls, injury, caregiver burden, and readmission. Will continue to follow POC. Discharge recommendation remains appropriate.  ?  ? ?Recommendations for follow up therapy are one component of a multi-disciplinary discharge planning process, led by the attending physician.  Recommendations may be updated based on patient status, additional functional criteria and insurance authorization. ?   ?Follow Up Recommendations ? Home health OT  ?  ?Assistance Recommended at Discharge Intermittent Supervision/Assistance  ?Patient can return home with the following ? A little help with walking and/or transfers;A little help with bathing/dressing/bathroom;Assistance with cooking/housework;Assist for transportation;Help with stairs or ramp for entrance;Direct  supervision/assist for medications management ?  ?Equipment Recommendations ? None recommended by OT  ?  ?Recommendations for Other Services   ? ?  ?Precautions / Restrictions Precautions ?Precautions: Fall ?Restrictions ?Weight Bearing Restrictions: No  ? ? ?  ? ?Mobility Bed Mobility ?Overal bed mobility: Needs Assistance ?Bed Mobility: Supine to Sit ?  ?Sidelying to sit: Min assist ?  ?  ?  ?General bed mobility comments: assist from spouse ?  ? ?Transfers ?Overall transfer level: Needs assistance ?Equipment used: None ?Transfers: Sit to/from Stand ?Sit to Stand: Min guard ?  ?  ?  ?  ?  ?General transfer comment: assist from spouse ?  ?  ?Balance Overall balance assessment: Needs assistance ?Sitting-balance support: No upper extremity supported, Feet supported ?Sitting balance-Leahy Scale: Good ?  ?  ?Standing balance support: Single extremity supported, During functional activity ?Standing balance-Leahy Scale: Fair ?  ?  ?  ?  ?  ?  ?  ?  ?  ?  ?  ?  ?   ? ?ADL either performed or assessed with clinical judgement  ? ?ADL Overall ADL's : Needs assistance/impaired ?  ?  ?  ?  ?  ?  ?  ?  ?  ?  ?  ?  ?  ?  ?  ?  ?  ?  ?  ?General ADL Comments: MOD A don pants/underwear sit<.stand - assist provided from wife at bed side. SBA for ADL t/f ?  ? ? ? ?Cognition Arousal/Alertness: Awake/alert ?Behavior During Therapy: [facility] for tasks assessed/performed ?Overall Cognitive Status: Within Functional Limits for tasks assessed ?  ?  ?  ?  ?  ?  ?  ?  ?  ?  ?  ?  ?  ?  ?  ?  ?  ?  ?  ?   ?   ?   ?   ? ? ?  Pertinent Vitals/ Pain       Pain Assessment ?Pain Assessment: No/denies pain ? ? ?Frequency ? Min 2X/week  ? ? ? ? ?  ?Progress Toward Goals ? ?OT Goals(current goals can now be found in the care plan section) ? Progress towards OT goals: Progressing toward goals ? ?Acute Rehab OT Goals ?Patient Stated Goal: to go home ?OT Goal Formulation: With patient ?Time For Goal Achievement: 01/06/22 ?Potential to Achieve Goals: Good ?ADL  Goals ?Pt Will Perform Grooming: with modified independence ?Pt Will Perform Upper Body Dressing: with modified independence ?Pt Will Perform Lower Body Dressing: with modified independence ?Pt Will Transfer to Toilet: with modified independence;ambulating  ?Plan Discharge plan remains appropriate;Frequency remains appropriate   ? ?Co-evaluation ? ? ?   ?  ?  ?  ?  ? ?  ?AM-PAC OT "6 Clicks" Daily Activity     ?Outcome Measure ? ? Help from another person eating meals?: None ?Help from another person taking care of personal grooming?: None ?Help from another person toileting, which includes using toliet, bedpan, or urinal?: A Little ?Help from another person bathing (including washing, rinsing, drying)?: A Lot ?Help from another person to put on and taking off regular upper body clothing?: A Little ?Help from another person to put on and taking off regular lower body clothing?: Total ?6 Click Score: 17 ? ?  ?End of Session   ? ?OT Visit Diagnosis: Unsteadiness on feet (R26.81);History of falling (Z91.81) ?  ?Activity Tolerance Patient limited by pain ?  ?Patient Left in bed;with call bell/phone within reach;with family/visitor present ?  ?Nurse Communication Mobility status ?  ? ?   ? ?Time: 1610-9604 ?OT Time Calculation (min): 27 min ? ?Charges: OT General Charges ?$OT Visit: 1 Visit ?OT Treatments ?$Self Care/Home Management : 23-37 mins ? ?Dessie Coma, M.S. OTR/L  ?12/25/21, 2:53 PM  ?ascom [phone] ? ?

## 2021-12-25 NOTE — Telephone Encounter (Signed)
?  Care Management  ? ?Follow Up Note ? ? ?12/25/2021 ?Name: Andrew Pruitt MRN: [id number] DOB: [birth year] ? ? ?Referred by: Jon Billings, NP ?Reason for referral : Chronic Care Management ? ? ?Patient in hospital at time of the scheduled call ? ?Follow Up Plan:  [facility] to reach out in ~2wks and hopefully RS pt ? ?Madelin Rear, PharmD, BCGP ?Clinical Pharmacist  ?[facility]  ?[phone] ? ?

## 2021-12-25 NOTE — TOC Progression Note (Signed)
Transition of Care (TOC) - Progression Note  ? ? ?Patient Details  ?Name: Andrew Pruitt ?MRN: [id number] ?Date of Birth: 12-04-1955 ? ?Transition of Care (TOC) CM/SW Contact  ?Alberteen Sam, LCSW ?Phone Number: ?12/25/2021, 9:33 AM ? ?Clinical Narrative:    ? ?CSW notes Corene Cornea with [facility] accepted patient for PT OT and RN services, home health orders are present.  ? ?TOC will continue to follow for discharge needs.  ? ?Expected Discharge Plan: [place] ?Barriers to Discharge: Continued Medical Work up ? ?Expected Discharge Plan and Services ?Expected Discharge Plan: [place] ?  ?Discharge Planning Services: CM Consult ?  ?Living arrangements for the past 2 months: [place] ?                ?DME Arranged: N/A ?DME Agency: NA ?  ?  ?  ?  ?  ?  ?  ?  ? ? ?Social Determinants of Health (SDOH) Interventions ?  ? ?Readmission Risk Interventions ?   ? View : No data to display.  ?  ?  ?  ? ? ?

## 2021-12-25 NOTE — Telephone Encounter (Signed)
Prep/previous notes ? ?Hypertension (BP goal <130/80) ?-Controlled ?-Current treatment: ?Amlodipine 5 mg once daily ?Appropriate, Effective, Safe, Accessible ?HCTZ 25 mg once daily  ?Appropriate, Effective, Safe, Accessible ?-Current home readings: n/a ?-Current dietary habits: high salt, high carb ?-Denies hypotensive/hypertensive symptoms ?-Educated on BP goals and benefits of medications for prevention of heart attack, stroke and kidney damage; ?Importance of home blood pressure monitoring; ?Symptoms of hypotension and importance of maintaining adequate hydration; ?-Counseled to monitor BP at home 1-2x/wk, document, and provide log at future appointments ?-Counseled on diet and exercise extensively ? ?Hyperlipidemia: (LDL goal < 100) ?-Not ideally controlled ?-Current treatment: ?Atorvastatin 80 mg once daily  ?Appropriate, Query effective, Safe, Accessible ?Fenofibrate 145 mg once daily ?Appropriate, Query effective, Safe, Accessible ?-Medications previously tried: n/a  ?-Side effect treview - no problems noted  ?-Educated on Cholesterol goals;  ?Importance of limiting foods high in cholesterol; ?-Recommended to continue current medication ? ?Diabetes (A1c goal <7%) ?-Controlled ?-Followed at [facility]  ?-Current medications: ?Novolin 75/25 200 units daily  ?Appropriate, Query effective, Safe, Accessible ?Trulicity 0.86 mg SQ Once Weekly (not yet approved)  ?Appropriate, Query effective, Safe, Accessible ?Farxiga 10 mg once daily (PAP approved)  ?Appropriate, Query effective, Safe, Accessible ?-Medications previously tried: metformin, other insulins   ?-Current home glucose readings. AVG 190s through CBG (equiv to a1c in the 8-9% range) ?-Denies hypoglycemic/hyperglycemic symptoms ?-Current meal patterns: breakfast: sausage, water or diet coke (a liter and a half/day). water late at night. Trying to cut down on bread which has been a major source of carb intake ?-Current exercise:  minimal ?-Educated on A1c and blood sugar goals; ?Complications of diabetes including kidney damage, retinal damage, and cardiovascular disease; ?Proper insulin injection technique; ?Prevention and management of hypoglycemic episodes; ?Benefits of routine self-monitoring of blood sugar; ?Continuous glucose monitoring; ?-Recommended to continue current medication ?

## 2021-12-25 NOTE — Care Management Important Message (Signed)
Important Message ? ?Patient Details  ?Name: Andrew Pruitt ?MRN: [id number] ?Date of Birth: 1956-05-19 ? ? ?Medicare Important Message Given:  Yes ? ? ? ? ?Dannette Barbara ?12/25/2021, 4:00 PM ?

## 2021-12-25 NOTE — Progress Notes (Signed)
Physical Therapy Treatment ?Patient Details ?Name: Andrew Pruitt ?MRN: [id number] ?DOB: 07/19/1956 ?Today's Date: 12/25/2021 ? ? ?History of Present Illness Pt is a 66 y/o M with recent STEMI s/p DES to LAD on 12/19/21 & was d/c on 12/21/21 but reports he only took about 10 steps from his truck & his legs buckled & he fell on a peice of wood on his L side before he could reach his house. Pt returned to the ER for further evaluation. Pt was found to have L sided 5-10 rib fx & acute stroke. MRI brain showed acute ischemic infarcts, punctate, bilateral, c/w central embolic source. PMH: CAD, HLD, poorly controlled IDDM, chronic systolic & diastolic CHF, CKD 3B, dilated aortic root, arthritis, depression, L food wound, OSA on CPAP ? ?  ?PT Comments  ? ? Pt resting in bed upon PT arrival; pt pre-medicated with pain meds for therapy session.  Performed orthostatic vitals during session: supine BP 136/83 with HR 78 bpm; sitting BP 118/76 with HR 77 bpm; standing BP at 0 minutes 116/66 with HR 83 bpm; and standing BP at 3 minutes 119/78 with HR 83 bpm.  Pt able to ambulate 30 feet with RW CGA but limited distance ambulating d/t pt reporting his back was cramping up (10/10) and unable to walk any further--pt reporting pain subsided to 4/10 resting in bed end of session.  Will continue to focus on strengthening and progressive functional mobility during hospitalization.  Anticipate pt's mobility will continue to progress with improved pain control. ?  ?Recommendations for follow up therapy are one component of a multi-disciplinary discharge planning process, led by the attending physician.  Recommendations may be updated based on patient status, additional functional criteria and insurance authorization. ? ?Follow Up Recommendations ? Home health PT ?  ?  ?Assistance Recommended at Discharge Intermittent Supervision/Assistance  ?Patient can return home with the following A little help with walking and/or transfers;A little help  with bathing/dressing/bathroom;Assistance with cooking/housework;Help with stairs or ramp for entrance ?  ?Equipment Recommendations ? Rolling walker (2 wheels);BSC/3in1  ?  ?Recommendations for Other Services   ? ? ?  ?Precautions / Restrictions Precautions ?Precautions: Fall ?Restrictions ?Weight Bearing Restrictions: No  ?  ? ?Mobility ? Bed Mobility ?Overal bed mobility: Needs Assistance ?Bed Mobility: Supine to Sit, Sit to Supine ?  ?Sidelying to sit: Supervision, HOB elevated ?Supine to sit: Supervision, HOB elevated ?  ?  ?General bed mobility comments: increased effort/time to perform on own ?  ? ?Transfers ?Overall transfer level: Needs assistance ?Equipment used: Rolling walker (2 wheels) ?Transfers: Sit to/from Stand ?Sit to Stand: Min guard ?  ?  ?  ?  ?  ?General transfer comment: x2 trials standing from bed with RW use; vc's for UE placement (pt preferring to keep hands on walker to stand) ?  ? ?Ambulation/Gait ?Ambulation/Gait assistance: Min guard ?Gait Distance (Feet): 30 Feet ?Assistive device: Rolling walker (2 wheels) ?  ?Gait velocity: decreased ?  ?  ?General Gait Details: partial step through gait pattern; vc's to stay within RW during turns; limited distance d/t back cramping ? ? ?Stairs ?  ?  ?  ?  ?  ? ? ?Wheelchair Mobility ?  ? ?Modified Rankin (Stroke Patients Only) ?  ? ? ?  ?Balance Overall balance assessment: Needs assistance ?Sitting-balance support: No upper extremity supported, Feet supported ?Sitting balance-Leahy Scale: Good ?Sitting balance - Comments: steady sitting reaching within BOS ?  ?Standing balance support: No upper extremity supported ?Standing balance-Leahy Scale:  Fair ?Standing balance comment: steady static standing no UE support ?  ?  ?  ?  ?  ?  ?  ?  ?  ?  ?  ?  ? ?  ?Cognition Arousal/Alertness: Awake/alert ?Behavior During Therapy: [facility] for tasks assessed/performed ?Overall Cognitive Status: Within Functional Limits for tasks assessed ?  ?  ?  ?  ?  ?  ?  ?  ?   ?  ?  ?  ?  ?  ?  ?  ?  ?  ?  ? ?  ?Exercises   ? ?  ?General Comments  Nursing cleared pt for participation in physical therapy.  Pt agreeable to PT session. ?  ?  ? ?Pertinent Vitals/Pain Pain Assessment ?Pain Assessment: Faces ?Pain Score: 4  ?Pain Location: L sided rib pain ?Pain Descriptors / Indicators: Aching, Grimacing, Guarding ?Pain Intervention(s): Limited activity within patient's tolerance, Monitored during session, Premedicated before session, Repositioned  ? ? ?Home Living   ?  ?  ?  ?  ?  ?  ?  ?  ?  ?   ?  ?Prior Function    ?  ?  ?   ? ?PT Goals (current goals can now be found in the care plan section) Acute Rehab PT Goals ?Patient Stated Goal: decrease pain ?PT Goal Formulation: With patient ?Time For Goal Achievement: 01/06/22 ?Potential to Achieve Goals: Fair ?Progress towards PT goals: Progressing toward goals ? ?  ?Frequency ? ? ? Min 2X/week ? ? ? ?  ?PT Plan Current plan remains appropriate  ? ? ?Co-evaluation   ?  ?  ?  ?  ? ?  ?AM-PAC PT "6 Clicks" Mobility   ?Outcome Measure ? Help needed turning from your back to your side while in a flat bed without using bedrails?: A Little ?Help needed moving from lying on your back to sitting on the side of a flat bed without using bedrails?: A Little ?Help needed moving to and from a bed to a chair (including a wheelchair)?: A Little ?Help needed standing up from a chair using your arms (e.g., wheelchair or bedside chair)?: A Little ?Help needed to walk in hospital room?: A Little ?Help needed climbing 3-5 steps with a railing? : A Lot ?6 Click Score: 17 ? ?  ?End of Session Equipment Utilized During Treatment: Gait belt ?Activity Tolerance: Patient limited by pain ?Patient left: in bed;with call bell/phone within reach;with bed alarm set;with nursing/sitter in room ?Nurse Communication: Mobility status;Precautions ?PT Visit Diagnosis: Muscle weakness (generalized) (M62.81);Difficulty in walking, not elsewhere classified (R26.2);Pain ?Pain -  Right/Left: Left ?Pain - part of body:  (ribs) ?  ? ? ?Time: 5883-2549 ?PT Time Calculation (min) (ACUTE ONLY): 31 min ? ?Charges:  $Therapeutic Exercise: 8-22 mins ?$Therapeutic Activity: 8-22 mins          ?          ? ?Leitha Bleak, PT ?12/25/21, 12:06 PM ? ? ?

## 2021-12-25 NOTE — Discharge Summary (Signed)
?Physician Discharge Summary ?  ?Patient: Andrew Pruitt MRN: [id number] DOB: Mar 14, 1956  ?Admit date:     12/21/2021  ?Discharge date: 12/25/21  ?Discharge Physician: Jennye Boroughs  ? ?PCP: Jon Billings, NP  ? ?Recommendations at discharge:  ? ?Follow-up with PCP in 1 week ?Follow-up with cardiologist on 12/09/2021 as scheduled ?Follow-up with cardiologist for Zio patch monitor ? ?Discharge Diagnoses: ?Principal Problem: ?  CVA (cerebral vascular accident) ([place]) ?Active Problems: ?  Fall ?  Low blood pressure ?  STEMI involving left anterior descending coronary artery ([place]) ?  SIRS (systemic inflammatory response syndrome) (HCC) ?  Type 2 diabetes mellitus with proliferative diabetic retinopathy of left eye without macular edema (HCC) ?  Diabetic ulcer of left foot ([place]) ?  Absent pedal pulses ?  Hyperlipidemia associated with type 2 diabetes mellitus ([place]) ?  Elevated LFTs ?  OSA (obstructive sleep apnea) ?  Hyponatremia ?  Rib fractures ?  Stage 3b chronic kidney disease (CKD) ([place]) ? ?Resolved Problems: ?  * No resolved hospital problems. * ? ?Hospital Course: ? ?[name] is a 66 y.o. male with medical history significant for CAD with recent STEMI s/p DES to LAD on 12/19/2021, hyperlipidemia, poorly controlled insulin-dependent diabetes mellitus, chronic systolic and diastolic CHF, CKD stage IIIb, dilated aortic root, arthritis, depression, left foot wound, OSA on CPAP intermittently at home.  He was discharged from the hospital on 12/21/2021.  However, he said that he only took about 10 steps from his truck and his legs buckled and he fell before he could reach his house.  He fell on a piece of wood on his left side.  He complained of severe left-sided pleuritic chest pain.  He returned to the emergency room for further evaluation. ? ?He was found to have left-sided 5th-10th rib fractures and acute stroke.  He was treated with analgesics.  He was evaluated by neurologist and no changes were made to  dual antiplatelet therapy (aspirin and Brilinta).  Zio patch monitor was recommended as an outpatient.  He was evaluated by PT and OT who recommended home health therapy.  His condition has improved.  Dizziness has resolved and left-sided chest pain and back pain have improved.  He is deemed stable for discharge to home today. ?  ? Pain control - [employer] Controlled Substance Reporting System database was reviewed. and patient was instructed, not to drive, operate heavy machinery, perform activities at heights, swimming or participation in water activities or provide baby-sitting services while on Pain, Sleep and Anxiety Medications; until their outpatient Physician has advised to do so again. Also recommended to not to take more than prescribed Pain, Sleep and Anxiety Medications.  ?Consultants: Cardiologist, neurologist ?Procedures performed: None ?Disposition: Home health ?Diet recommendation:  ?Discharge Diet Orders (From admission, onward)  ? ?  Start     Ordered  ? 12/25/21 0000  Diet - low sodium heart healthy       ? 12/25/21 1310  ? ?  ?  ? ?  ? ?Cardiac and Carb modified diet ?DISCHARGE MEDICATION: ?Allergies as of 12/25/2021   ? ?   Reactions  ? Niaspan [niacin] Other (See Comments)  ? Red skin, red eyes  ? Trazodone Other (See Comments)  ? nightmares  ? ?  ? ?  ?Medication List  ?  ? ?STOP taking these medications   ? ?insulin aspart 100 UNIT/ML injection ?Commonly known as: novoLOG ?  ? ?  ? ?TAKE these medications   ? ?acetaminophen  325 MG tablet ?Commonly known as: TYLENOL ?Take 2 tablets (650 mg total) by mouth every 6 (six) hours as needed for mild pain (or Fever >/= 101). ?  ?aspirin 81 MG chewable tablet ?Chew 1 tablet (81 mg total) by mouth daily. ?  ?atorvastatin 80 MG tablet ?Commonly known as: LIPITOR ?Take 1 tablet (80 mg total) by mouth daily. ?  ?B-D UF III MINI PEN NEEDLES 31G X 5 MM Misc ?Generic drug: Insulin Pen Needle ?USE DAILY ?  ?carvedilol 3.125 MG tablet ?Commonly known as:  COREG ?Take 1 tablet (3.125 mg total) by mouth 2 (two) times daily with a meal. ?  ?dapagliflozin propanediol 10 MG Tabs tablet ?Commonly known as: [place] ?Take 1 tablet (10 mg total) by mouth daily before breakfast. ?  ?fenofibrate 160 MG tablet ?Take 1 tablet (160 mg total) by mouth daily. ?  ?FreeStyle Libre 14 Day Reader [name] ?Use to check glucose levels TID; E11.9 ?  ?FreeStyle Libre 14 Day Sensor Misc ?Use to check glucose TID. Change every 14 days. E 11.9 ?  ?gabapentin 100 MG capsule ?Commonly known as: NEURONTIN ?Take 1 capsule (100 mg total) by mouth 3 (three) times daily. ?  ?insulin detemir 100 UNIT/ML FlexPen ?Commonly known as: LEVEMIR ?Inject 20 Units into the skin 2 (two) times daily. ?  ?losartan 25 MG tablet ?Commonly known as: COZAAR ?Take 1 tablet (25 mg total) by mouth daily. ?  ?oxyCODONE 5 MG immediate release tablet ?Commonly known as: Oxy IR/ROXICODONE ?Take 1 tablet (5 mg total) by mouth every 8 (eight) hours as needed for moderate pain. ?  ?ticagrelor 90 MG Tabs tablet ?Commonly known as: BRILINTA ?Take 1 tablet (90 mg total) by mouth 2 (two) times daily. ?  ?venlafaxine XR 75 MG 24 hr capsule ?Commonly known as: Effexor XR ?Take 1 capsule (75 mg total) by mouth daily with breakfast. ?  ? ?  ? ?  ?  ? ? ?  ?Discharge Care Instructions  ?(From admission, onward)  ?  ? ? ?  ? ?  Start     Ordered  ? 12/25/21 0000  Discharge wound care:       ?Comments: Wound care to left foot full thickness wound:  Cleanse with NS, pat dry. Apply povidone iodine (betadine) moistened gauze dressings to wound, top with dry gauze and secure with Kerlix roll gauze/paper tape.  ? 12/25/21 1310  ? ?  ?  ? ?  ? ? Follow-up Information   ? ? Theora Gianotti, NP Follow up on 12/14/2021.   ?Specialties: Nurse Practitioner, Cardiology, Radiology ?Why: Office follow-up @ 10:55 am. ?We will also arrange for a heart monitor to wear at home x 2 wks and mail to you. ?Contact information: ?[address] ?[address] ?[place] [address] ?[phone] ? ? ?  ?  ? ?  ?  ? ?  ? ?Discharge Exam: ?Filed Weights  ? 12/23/21 0500 12/24/21 0405 12/25/21 0400  ?Weight: 124 kg 124.6 kg 120.2 kg  ? ?GEN: NAD ?SKIN: Abrasion on left elbow.  Small wound between the left first and second toes ?EYES: EOMI ?ENT: MMM ?CV: RRR ?PULM: CTA B ?ABD: soft, obese, NT, +BS ?CNS: AAO x 3, non focal ?EXT: No edema or tenderness ?MSK: Left-sided chest wall tenderness ? ?Condition at discharge: good ? ?The results of significant diagnostics from this hospitalization (including imaging, microbiology, ancillary and laboratory) are listed below for reference.  ? ?Imaging Studies: ?MR ANGIO HEAD WO CONTRAST ? ?  Result Date: 12/22/2021 ?CLINICAL DATA:  Neuro deficit, acute, stroke suspected EXAM: MRA NECK WITHOUT CONTRAST MRA HEAD WITHOUT CONTRAST TECHNIQUE: Angiographic images of the Circle of Willis were acquired using MRA technique without intravenous contrast. COMPARISON:  MRI head from the same day. Carotid ultrasound from the same day. FINDINGS: MRA NECK FINDINGS Artifact limits evaluation, particularly proximally. Within this limitation, no visible high-grade stenosis of either the carotid systems or vertebral arteries. Right vertebral artery is dominant. Also, in plane loss of flow related signal limits evaluation of the V3 vertebral arteries and proximal left vertebral artery. MRA HEAD FINDINGS Anterior circulation: Bilateral intracranial ICAs, MCAs, and ACAs are patent. Probably severe proximal right M2 MCA stenosis per no aneurysm identified. Posterior circulation: Bilateral intradural vertebral arteries, basilar artery, and posterior cerebral arteries are patent without proximal hemodynamically significant stenosis. Right posterior communicating artery is present. No aneurysm identified. IMPRESSION: MRA head: 1. No emergent large vessel occlusion. 2. Probably severe proximal right M2 MCA stenosis. MRA neck: Limited by artifact without visible  high-grade stenosis. Electronically Signed   By: Margaretha Sheffield M.D.   On: 12/22/2021 15:29  ? ?MR ANGIO NECK WO CONTRAST ? ?Result Date: 12/22/2021 ?CLINICAL DATA:  Neuro deficit, acute, stroke suspected

## 2021-12-25 NOTE — Progress Notes (Signed)
Andrew Pruitt to be D/C'd Home per MD order.  Discussed prescriptions and follow up appointments with the patient. Prescriptions given to patient, medication list explained in detail. Pt verbalized understanding. ? ?Allergies as of 12/25/2021   ? ?   Reactions  ? Niaspan [niacin] Other (See Comments)  ? Red skin, red eyes  ? Trazodone Other (See Comments)  ? nightmares  ? ?  ? ?  ?Medication List  ?  ? ?STOP taking these medications   ? ?insulin aspart 100 UNIT/ML injection ?Commonly known as: novoLOG ?  ? ?  ? ?TAKE these medications   ? ?acetaminophen 325 MG tablet ?Commonly known as: TYLENOL ?Take 2 tablets (650 mg total) by mouth every 6 (six) hours as needed for mild pain (or Fever >/= 101). ?  ?aspirin 81 MG chewable tablet ?Chew 1 tablet (81 mg total) by mouth daily. ?  ?atorvastatin 80 MG tablet ?Commonly known as: LIPITOR ?Take 1 tablet (80 mg total) by mouth daily. ?  ?B-D UF III MINI PEN NEEDLES 31G X 5 MM Misc ?Generic drug: Insulin Pen Needle ?USE DAILY ?  ?carvedilol 3.125 MG tablet ?Commonly known as: COREG ?Take 1 tablet (3.125 mg total) by mouth 2 (two) times daily with a meal. ?  ?dapagliflozin propanediol 10 MG Tabs tablet ?Commonly known as: [place] ?Take 1 tablet (10 mg total) by mouth daily before breakfast. ?  ?fenofibrate 160 MG tablet ?Take 1 tablet (160 mg total) by mouth daily. ?  ?FreeStyle Libre 14 Day Reader [name] ?Use to check glucose levels TID; E11.9 ?  ?FreeStyle Libre 14 Day Sensor Misc ?Use to check glucose TID. Change every 14 days. E 11.9 ?  ?gabapentin 100 MG capsule ?Commonly known as: NEURONTIN ?Take 1 capsule (100 mg total) by mouth 3 (three) times daily. ?  ?insulin detemir 100 UNIT/ML FlexPen ?Commonly known as: LEVEMIR ?Inject 20 Units into the skin 2 (two) times daily. ?  ?losartan 25 MG tablet ?Commonly known as: COZAAR ?Take 1 tablet (25 mg total) by mouth daily. ?  ?oxyCODONE 5 MG immediate release tablet ?Commonly known as: Oxy IR/ROXICODONE ?Take 1 tablet (5 mg  total) by mouth every 8 (eight) hours as needed for moderate pain. ?  ?ticagrelor 90 MG Tabs tablet ?Commonly known as: BRILINTA ?Take 1 tablet (90 mg total) by mouth 2 (two) times daily. ?  ?venlafaxine XR 75 MG 24 hr capsule ?Commonly known as: Effexor XR ?Take 1 capsule (75 mg total) by mouth daily with breakfast. ?  ? ?  ? ?  ?  ? ? ?  ?Discharge Care Instructions  ?(From admission, onward)  ?  ? ? ?  ? ?  Start     Ordered  ? 12/25/21 0000  Discharge wound care:       ?Comments: Wound care to left foot full thickness wound:  Cleanse with NS, pat dry. Apply povidone iodine (betadine) moistened gauze dressings to wound, top with dry gauze and secure with Kerlix roll gauze/paper tape.  ? 12/25/21 1310  ? ?  ?  ? ?  ? ? ?Vitals:  ? 12/25/21 1020 12/25/21 1027  ?BP: (!) 155/82   ?Pulse: 78 81  ?Resp:    ?Temp:    ?SpO2: 97% 99%  ? ? ?Tele box removed and returned. Skin clean, dry and intact without evidence of skin break down, no evidence of skin tears noted. IV catheter discontinued intact. Site without signs and symptoms of complications. Dressing and pressure applied. Pt denies pain  at this time. No complaints noted. ? ?An After Visit Summary was printed and given to the patient. ?Patient escorted via [place], and D/C home via private auto. ? ?Rolley Sims  ?

## 2021-12-25 NOTE — Telephone Encounter (Signed)
TOC patient discharged today- call pt 12/26/21. ?

## 2021-12-26 ENCOUNTER — Telehealth: Payer: Self-pay

## 2021-12-26 NOTE — Telephone Encounter (Signed)
Transition Care Management Unsuccessful Follow-up Telephone Call ? ?Date of discharge and from where:  12/25/21 [facility]  ? ?Attempts:  1st Attempt ? ?Reason for unsuccessful TCM follow-up call:  Left voice message ? ? ? ?

## 2021-12-26 NOTE — Telephone Encounter (Signed)
Transition Care Management Follow-up Telephone Call ?Date of discharge and from where: 12/25/21 [facility]  ?How have you been since you were released from the hospital? Sore  ?Any questions or concerns? No ? ?Items Reviewed: ?Did the pt receive and understand the discharge instructions provided? Yes  ?Medications obtained and verified? Yes  ?Other? No  ?Any new allergies since your discharge? No  ?Dietary orders reviewed? Yes ?Do you have support at home? Yes  ? ?Home Care and Equipment/Supplies: ?Were home health services ordered? no ?If so, what is the name of the agency?  ?Has the agency set up a time to come to the patient's home? not applicable ?Were any new equipment or medical supplies ordered?  No ?What is the name of the medical supply agency?  ?Were you able to get the supplies/equipment? not applicable ?Do you have any questions related to the use of the equipment or supplies? No ? ?Functional Questionnaire: (I = Independent and D = Dependent) ?ADLs: I ? ?Bathing/Dressing- I ? ?Meal Prep- I ? ?Eating- I ? ?Maintaining continence- I ? ?Transferring/Ambulation- I ? ?Managing Meds- I ? ?Follow up appointments reviewed: ? ?PCP Hospital f/u appt confirmed? No   ?Specialist Hospital f/u appt confirmed? Yes  Scheduled to see Dr Dr Sharolyn Douglas on 12/09/2021 @ 10:55. ?Are transportation arrangements needed? No  ?If their condition worsens, is the pt aware to call PCP or go to the Emergency Dept.? Yes ?Was the patient provided with contact information for the PCP's office or ED? Yes ?Was to pt encouraged to call back with questions or concerns? Yes ? ?

## 2021-12-27 ENCOUNTER — Telehealth: Payer: Self-pay | Admitting: Emergency Medicine

## 2021-12-27 NOTE — Telephone Encounter (Signed)
Transition Care Management Unsuccessful Follow-up Telephone Call ? ?Date of discharge and from where:  12/25/21 [facility] ? ?Attempts:  3rd Attempt ? ?Reason for unsuccessful TCM follow-up call:  Left voice message ? ? ? ?

## 2021-12-27 NOTE — Telephone Encounter (Signed)
Attempted to call patient. No answer. Lmtcb ?

## 2021-12-27 NOTE — Telephone Encounter (Signed)
Transition Care Management Unsuccessful Follow-up Telephone Call ? ?Date of discharge and from where:  [facility] 12/25/21 ? ?Attempts:  2nd Attempt ? ?Reason for unsuccessful TCM follow-up call:  Left voice message ? ? ? ?

## 2021-12-27 NOTE — Telephone Encounter (Signed)
-----   Message from Minna Merritts, MD sent at 12/24/2021 11:57 AM EDT ----- ?Hospitalized with strokes ?Can we send him a Zio monitor in the mail ?He will likely need a phone call to discuss that it will be coming in the mail to his house ?Will likely be discharged from the hospital this week ? ?Thx ?TG ? ?

## 2021-12-28 DIAGNOSIS — I639 Cerebral infarction, unspecified: Secondary | ICD-10-CM

## 2021-12-29 ENCOUNTER — Encounter: Payer: Self-pay | Admitting: Nurse Practitioner

## 2021-12-29 ENCOUNTER — Telehealth: Payer: Self-pay | Admitting: Nurse Practitioner

## 2021-12-29 ENCOUNTER — Ambulatory Visit (INDEPENDENT_AMBULATORY_CARE_PROVIDER_SITE_OTHER): Payer: Medicare Other | Admitting: Nurse Practitioner

## 2021-12-29 ENCOUNTER — Telehealth: Payer: Medicare Other

## 2021-12-29 ENCOUNTER — Ambulatory Visit: Payer: Self-pay

## 2021-12-29 VITALS — BP 138/68 | HR 93 | Ht 72.0 in | Wt 276.5 lb

## 2021-12-29 DIAGNOSIS — I2510 Atherosclerotic heart disease of native coronary artery without angina pectoris: Secondary | ICD-10-CM

## 2021-12-29 DIAGNOSIS — I2102 ST elevation (STEMI) myocardial infarction involving left anterior descending coronary artery: Secondary | ICD-10-CM

## 2021-12-29 DIAGNOSIS — Z794 Long term (current) use of insulin: Secondary | ICD-10-CM

## 2021-12-29 DIAGNOSIS — I255 Ischemic cardiomyopathy: Secondary | ICD-10-CM | POA: Diagnosis not present

## 2021-12-29 DIAGNOSIS — I2109 ST elevation (STEMI) myocardial infarction involving other coronary artery of anterior wall: Secondary | ICD-10-CM | POA: Diagnosis not present

## 2021-12-29 DIAGNOSIS — E785 Hyperlipidemia, unspecified: Secondary | ICD-10-CM

## 2021-12-29 DIAGNOSIS — N183 Chronic kidney disease, stage 3 unspecified: Secondary | ICD-10-CM

## 2021-12-29 DIAGNOSIS — E1169 Type 2 diabetes mellitus with other specified complication: Secondary | ICD-10-CM

## 2021-12-29 DIAGNOSIS — I5022 Chronic systolic (congestive) heart failure: Secondary | ICD-10-CM

## 2021-12-29 DIAGNOSIS — I639 Cerebral infarction, unspecified: Secondary | ICD-10-CM

## 2021-12-29 DIAGNOSIS — E1122 Type 2 diabetes mellitus with diabetic chronic kidney disease: Secondary | ICD-10-CM

## 2021-12-29 DIAGNOSIS — I10 Essential (primary) hypertension: Secondary | ICD-10-CM

## 2022-01-01 ENCOUNTER — Telehealth: Payer: Self-pay | Admitting: Nurse Practitioner

## 2022-01-01 ENCOUNTER — Inpatient Hospital Stay: Payer: Medicare Other | Admitting: Physician Assistant

## 2022-01-01 NOTE — Telephone Encounter (Signed)
I called and spoke with an [employer] rep and advised that the patient passed away on Friday 23-Jan-2022 after a VF arrest, therefore there will be no further notifications coming from this patient's ZIO AT monitor. ? ?They inquired if I knew if they family was sending the monitor back. ?I advised I was unsure of the status of the monitor at this time. ? ?Per [employer] rep, she will update the patient's status in their system. ? ? ?

## 2022-01-04 ENCOUNTER — Ambulatory Visit: Payer: Medicare Other | Admitting: Podiatry

## 2022-01-08 ENCOUNTER — Ambulatory Visit: Payer: Medicare Other | Admitting: Nurse Practitioner

## 2022-01-08 NOTE — Telephone Encounter (Signed)
Left voice mail for Home health to return call ?

## 2022-01-08 NOTE — Telephone Encounter (Signed)
Okay to give verbal order.  Please encourage patient to keep appointment for Monday so we can have a visit within 30 days for home health purposes.  ?

## 2022-01-08 NOTE — Progress Notes (Addendum)
? ? ?Office Visit  ?  ?Patient Name: Andrew Pruitt ?Date of Encounter: 01/02/2022 ? ?Primary Care Provider:  Jon Billings, NP ?Primary Cardiologist:  Nelva Bush, MD ? ?Chief Complaint  ?  ?66 year old male with a history of hypertension, hyperlipidemia, poorly controlled diabetes, stage III chronic kidney disease, osteoarthritis, left foot ulceration, and depression, who was recently hospitalized with anterior STEMI requiring LAD stenting, HFrEF, ischemic cardiomyopathy, and subsequent readmission following fall with rib fractures and finding of small acute brain infarcts, who presents for follow-up of CAD and heart failure. ? ?Past Medical History  ?  ?Past Medical History:  ?Diagnosis Date  ? Arthritis   ? CAD (coronary artery disease)   ? a. 12/2021 Anterior STEMI/PCI: LM nl, LAD 95p/34m45m (2.75x34 Onyx Frontier DES), LCX 60d, LPDA 100, RCA nondom, 50p, 869m ? CKD (chronic kidney disease), stage III ([place]  ? Depression   ? Diabetes mellitus without complication ([place]  ? Dilated aortic root ([facility] [place]  ? a. 12/2021 Echo: Ao Root 3919m ? HFrEF (heart failure with reduced ejection fraction) ([place] ? a. 12/2021 Echo: EF 35-40%, mld LVH, GrI DD, mid-apical anteroseptal/apical/ant AK. Nl RV fxn; b. 12/2021 Echo/bubble: EF 35-40%, mid-dist ant and apical HK, neg bubble study.  ? Hyperlipidemia   ? Hypertension   ? Ischemic cardiomyopathy   ? a. 12/2021 s/p Ant STEMI and LAD DES; b. 12/2021 Echo: EF 35-40%.  ? ?Past Surgical History:  ?Procedure Laterality Date  ? CATARACT EXTRACTION Right   ? CATARACT EXTRACTION Left   ? CORONARY/GRAFT ACUTE MI REVASCULARIZATION N/A 12/19/2021  ? Procedure: Coronary/Graft Acute MI Revascularization;  Surgeon: EndNelva BushD;  [facility];  Service: Cardiovascular;  Laterality: N/A;  ? LEFT HEART CATH AND CORONARY ANGIOGRAPHY N/A 12/19/2021  ? Procedure: LEFT HEART CATH AND CORONARY ANGIOGRAPHY;  Surgeon: EndNelva BushD;  [facility];  Service: Cardiovascular;  Laterality: N/A;  ? TONSILLECTOMY    ? ? ?Allergies ? ?Allergies  ?Allergen Reactions  ? Niaspan [Niacin] Other (See Comments)  ?  Red skin, red eyes  ? Trazodone Other (See Comments)  ?  nightmares  ? ? ?History of Present Illness  ?  ?[age]ar old male with the above past medical history including recent anterior STEMI, CAD, hypertension, hyperlipidemia, poorly controlled diabetes, stage III chronic kidney disease, HFrEF, ischemic cardiomyopathy, dilated aortic root, osteoarthritis, depression, and left foot ulceration.  [facility] in the setting of severe, sharp substernal chest pain with nausea.  He was found to have anterior ST segment elevation and underwent emergent catheterization revealing severe proximal and mid LAD disease as well as small vessel LPDA and RCA disease, and moderate nonobstructive left circumflex disease.  The LAD was successfully treated with a 2.75 x 34 mm Onyx frontier drug-eluting stent.  Echo showed an EF of 35 to 40% with mild LVH, grade 1 diastolic dysfunction, and mid to apical anteroseptal/apical/anterior akinesis.  Post PCI course was relatively uneventful and he was placed on guideline directed medical therapy and discharged April 13.  Upon arriving home, he got out of his truck and then felt wobbly and fell onto his left side, striking his left upper chest and rib cage on a piece of wood.  He developed severe left-sided chest pain.  His wife drove back to the ED, where ECG showed mild persistent inferior ST segment elevation with evidence of anteroseptal infarct.  Overall, ECG was unchanged  and this was not felt to represent an acute MI.  Lab work was notable for troponin greater than 24,000.  CT of the chest and abdomen showed minimally displaced left fifth through eighth rib fractures, nondisplaced left anterior ninth rib fracture, and fracture through the anterior 10th rib chondral cartilage.  In the setting of  leg weakness leading to fall, MRI of the brain was performed and showed few, scattered punctate acute ischemic nonhemorrhagic infarcts involving both cerebral hemispheres.  Troponin elevation was felt to be secondary to prior event, his clinical picture was not consistent with ACS.  Patient was seen by neurology and was not noted to have any significant neurologic deficits.  Echo with bubble study continue to show LV dysfunction with an EF of 35 to 40%, without evidence of interatrial shunt.  Carotid ultrasound did not show any significant stenoses while MRA of the head and neck showed probable severe stenosis of the proximal right M2 MCA.  Antiplatelet and statin therapy were continued.  Mr. Sitar was discharged home April 16, with plan for Zio monitoring to rule out arrhythmia as cause of embolic strokes. ? ?Since discharge, he has continued to have significant left-sided rib pain.  He tried using oxycodone, which was prescribed at discharge however, this caused severe nightmares.  He is currently using Tylenol and cough medicine, as symptoms are worse when he coughs.  He has not had any angina and denies dyspnea, palpitations, PND, orthopnea, dizziness, syncope, edema, or early satiety.  He has been tolerating his medications well. ? ?Home Medications  ?  ?Current Outpatient Medications  ?Medication Sig Dispense Refill  ? acetaminophen (TYLENOL) 325 MG tablet Take 2 tablets (650 mg total) by mouth every 6 (six) hours as needed for mild pain (or Fever >/= 101).    ? atorvastatin (LIPITOR) 80 MG tablet Take 1 tablet (80 mg total) by mouth daily. 90 tablet 3  ? B-D UF III MINI PEN NEEDLES 31G X 5 MM MISC USE DAILY 100 each 0  ? carvedilol (COREG) 3.125 MG tablet Take 1 tablet (3.125 mg total) by mouth 2 (two) times daily with a meal. 60 tablet 0  ? Continuous Blood Gluc Receiver (FREESTYLE LIBRE 14 DAY READER) DEVI Use to check glucose levels TID; E11.9 1 each 0  ? Continuous Blood Gluc Sensor (FREESTYLE LIBRE 14  DAY SENSOR) MISC Use to check glucose TID. Change every 14 days. E 11.9 6 each 3  ? dapagliflozin propanediol (FARXIGA) 10 MG TABS tablet Take 1 tablet (10 mg total) by mouth daily before breakfast. 90 tablet 1  ? fenofibrate 160 MG tablet Take 1 tablet (160 mg total) by mouth daily. 30 tablet 0  ? gabapentin (NEURONTIN) 100 MG capsule Take 1 capsule (100 mg total) by mouth 3 (three) times daily. 90 capsule 0  ? insulin detemir (LEVEMIR) 100 UNIT/ML FlexPen Inject 20 Units into the skin 2 (two) times daily. 12 mL 0  ? losartan (COZAAR) 25 MG tablet Take 1 tablet (25 mg total) by mouth daily. 30 tablet 0  ? oxyCODONE (OXY IR/ROXICODONE) 5 MG immediate release tablet Take 1 tablet (5 mg total) by mouth every 8 (eight) hours as needed for moderate pain. 10 tablet 0  ? ticagrelor (BRILINTA) 90 MG TABS tablet Take 1 tablet (90 mg total) by mouth 2 (two) times daily. 60 tablet 0  ? venlafaxine XR (EFFEXOR XR) 75 MG 24 hr capsule Take 1 capsule (75 mg total) by mouth daily with breakfast. 90 capsule 1  ?  aspirin 81 MG chewable tablet Chew 1 tablet (81 mg total) by mouth daily. (Patient not taking: Reported on 01/06/2022) 30 tablet 0  ? ?No current facility-administered medications for this visit.  ?  ? ?Review of Systems  ?  ?Ongoing rib pain as outlined above.  He denies angina, dyspnea, palpitations, PND, orthopnea, dizziness, syncope, edema, or early satiety.  All other systems reviewed and are otherwise negative except as noted above. ?  ? ?Physical Exam  ?  ?VS:  BP 138/68 (BP Location: Left Arm, Patient Position: Sitting, Cuff Size: Large)   Pulse 93   Ht 6' (1.829 m)   Wt 276 lb 8 oz (125.4 kg)   SpO2 98%   BMI 37.50 kg/m?  , BMI Body mass index is 37.5 kg/m?. ?    ?GEN: Obese, in no acute distress. ?HEENT: normal. ?Neck: Supple, obese, difficult to gauge JVP.  No bruits or masses.   ?Cardiac: RRR, no murmurs, rubs, or gallops. No clubbing, cyanosis, trace ankle edema above his sock line bilaterally.  Radials/PT  2+ and equal bilaterally.  Right radial cath site without bleeding, bruit, or hematoma. ?Respiratory:  Respirations regular and unlabored, clear to auscultation bilaterally. ?GI: Obese and protuberant, sem

## 2022-01-08 NOTE — Chronic Care Management (AMB) (Signed)
? Care Management ?  ? RN Visit Note ? ?12/20/2021 ?Name: Andrew Pruitt MRN: [id number] DOB: 14-Sep-1955 ? ?Subjective: ?Andrew Pruitt is a 66 y.o. year old male who is a primary care patient of Jon Billings, NP. The care management team was consulted for assistance with disease management and care coordination needs.   ? ?Spoke to the patients wife. The patient has expired  for follow up visit in response to provider referral for case management and/or care coordination services. 12/13/2021: The patient had a cardiac event and expired. Time of death 72.  ? ?Consent to Services:  ? Andrew Pruitt was given information about Care Management services today including:  ?Care Management services includes personalized support from designated clinical staff supervised by his physician, including individualized plan of care and coordination with other care providers ?24/7 contact phone numbers for assistance for urgent and routine care needs. ?The patient may stop case management services at any time by phone call to the office staff. ? ?Patient agreed to services and consent obtained.  ? ?Assessment: Review of patient past medical history, allergies, medications, health status, including review of consultants reports, laboratory and other test data, was performed as part of comprehensive evaluation and provision of chronic care management services.  ? ?SDOH (Social Determinants of Health) assessments and interventions performed:   ? ?Care Plan ? ?Allergies  ?Allergen Reactions  ? Niaspan [Niacin] Other (See Comments)  ?  Red skin, red eyes  ? Trazodone Other (See Comments)  ?  nightmares  ? ? ?Outpatient Encounter Medications as of 12/23/2021  ?Medication Sig  ? acetaminophen (TYLENOL) 325 MG tablet Take 2 tablets (650 mg total) by mouth every 6 (six) hours as needed for mild pain (or Fever >/= 101).  ? aspirin 81 MG chewable tablet Chew 1 tablet (81 mg total) by mouth daily. (Patient not taking: Reported on 12/21/2021)  ?  atorvastatin (LIPITOR) 80 MG tablet Take 1 tablet (80 mg total) by mouth daily.  ? B-D UF III MINI PEN NEEDLES 31G X 5 MM MISC USE DAILY  ? carvedilol (COREG) 3.125 MG tablet Take 1 tablet (3.125 mg total) by mouth 2 (two) times daily with a meal.  ? Continuous Blood Gluc Receiver (FREESTYLE LIBRE 14 DAY READER) DEVI Use to check glucose levels TID; E11.9  ? Continuous Blood Gluc Sensor (FREESTYLE LIBRE 14 DAY SENSOR) MISC Use to check glucose TID. Change every 14 days. E 11.9  ? dapagliflozin propanediol (FARXIGA) 10 MG TABS tablet Take 1 tablet (10 mg total) by mouth daily before breakfast.  ? fenofibrate 160 MG tablet Take 1 tablet (160 mg total) by mouth daily.  ? gabapentin (NEURONTIN) 100 MG capsule Take 1 capsule (100 mg total) by mouth 3 (three) times daily.  ? insulin detemir (LEVEMIR) 100 UNIT/ML FlexPen Inject 20 Units into the skin 2 (two) times daily.  ? losartan (COZAAR) 25 MG tablet Take 1 tablet (25 mg total) by mouth daily.  ? oxyCODONE (OXY IR/ROXICODONE) 5 MG immediate release tablet Take 1 tablet (5 mg total) by mouth every 8 (eight) hours as needed for moderate pain.  ? ticagrelor (BRILINTA) 90 MG TABS tablet Take 1 tablet (90 mg total) by mouth 2 (two) times daily.  ? venlafaxine XR (EFFEXOR XR) 75 MG 24 hr capsule Take 1 capsule (75 mg total) by mouth daily with breakfast.  ? ?No facility-administered encounter medications on file as of 12/15/2021.  ? ? ?Patient Active Problem List  ? Diagnosis Date Noted  ? Fall  12/22/2021  ? Elevated LFTs 12/22/2021  ? SIRS (systemic inflammatory response syndrome) ([place]) 12/22/2021  ? Absent pedal pulses 12/22/2021  ? Low blood pressure 12/22/2021  ? OSA (obstructive sleep apnea) 12/22/2021  ? Hyponatremia 12/22/2021  ? Rib fractures 12/22/2021  ? CVA (cerebral vascular accident) (Andrew Pruitt) 12/22/2021  ? Stage 3b chronic kidney disease (CKD) ([place]) 12/22/2021  ? Obesity (BMI 30-39.9) 12/20/2021  ? STEMI (ST elevation myocardial infarction) ([place]) 12/19/2021  ? STEMI  involving left anterior descending coronary artery ([place]) 12/19/2021  ? Poorly controlled diabetes mellitus ([place]) 12/19/2021  ? Diabetic ulcer of left foot (Andrew Pruitt) 12/18/2021  ? Depression, recurrent (Andrew Pruitt)   ? Nuclear sclerotic cataract of both eyes 09/07/2020  ? [place]hoe retinal tear, right eye 08/11/2020  ? Viral warts 06/07/2020  ? Diabetic macular edema of right eye with proliferative retinopathy associated with type 2 diabetes mellitus (Andrew Pruitt) 04/20/2020  ? Proliferative diabetic retinopathy of right eye (Andrew Pruitt) 03/02/2020  ? Type 2 diabetes mellitus with proliferative diabetic retinopathy of left eye without macular edema (Andrew Pruitt) 03/02/2020  ? Diabetic neuropathy associated with type 2 diabetes mellitus (Andrew Pruitt) 02/22/2020  ? Type 2 diabetes mellitus (Andrew Pruitt) 02/22/2020  ? Hypertension associated with diabetes (Andrew Pruitt) 02/22/2020  ? Hyperlipidemia associated with type 2 diabetes mellitus ([place]) 02/22/2020  ? Prostate cancer screening 02/22/2020  ? Plantar warts 02/22/2020  ? ? ?Conditions to be addressed/monitored: Patient called for regular outreach and spoke with the patients wife and paramedic at the scene. The patient collapsed when returning home from the cardiologist office today. See notes below.  ? ?Care Plan : [place]  ?Updates made by Vanita Ingles, RN since 01/01/2022 12:00 AM  ?Completed 01/01/2022  ? ?Problem: HTN, HLD, DM2, depression, insomnia Resolved 01/04/2022  ?Priority: High  ?  ? ?Long-Range Goal: Disease Management Completed 12/31/2021  ?Start Date: 09/25/2021  ?Recent Progress: On track  ?Priority: High  ?Note:   ?Current Barriers:  ?Unable to independently afford treatment regimen ?Unable to achieve control of t2dm  ? ?Pharmacist Clinical Goal(s):  ?Patient will verbalize ability to afford treatment regimen ?achieve adherence to monitoring guidelines and medication adherence to achieve therapeutic efficacy ?contact provider office for questions/concerns as evidenced notation of same in  electronic health record through collaboration with PharmD and provider.  ? ?Interventions: ?1:1 collaboration with Jon Billings, NP regarding development and update of comprehensive plan of care as evidenced by provider attestation and co-signature ?Inter-disciplinary care team collaboration (see longitudinal plan of care) ?Comprehensive medication review performed; medication list updated in electronic medical record ? ?Hypertension (BP goal <130/80) ?-Controlled ?-Current treatment: ?Amlodipine 5 mg once daily ?Appropriate, Effective, Safe, Accessible ?HCTZ 25 mg once daily  ?Appropriate, Effective, Safe, Accessible ?-Current home readings: n/a ?-Current dietary habits: high salt, high carb ?-Denies hypotensive/hypertensive symptoms ?-Educated on BP goals and benefits of medications for prevention of heart attack, stroke and kidney damage; ?Importance of home blood pressure monitoring; ?Symptoms of hypotension and importance of maintaining adequate hydration; ?-Counseled to monitor BP at home 1-2x/wk, document, and provide log at future appointments ?-Counseled on diet and exercise extensively ? ?Hyperlipidemia: (LDL goal < 100) ?-Not ideally controlled ?-Current treatment: ?Atorvastatin 80 mg once daily  ?Appropriate, Query effective, Safe, Accessible ?Fenofibrate 145 mg once daily ?Appropriate, Query effective, Safe, Accessible ?-Medications previously tried: n/a  ?-Side effect treview - no problems noted  ?-Educated on Cholesterol goals;  ?Importance of limiting foods high in cholesterol; ?-Recommended to continue current medication ? ?Diabetes (A1c goal <7%) ?-Controlled ?-Now followed  at [facility]  ?-Current medications: ?Novolin 75/25 200 units daily  ?Appropriate, Query effective, Safe, Accessible ?Trulicity 5.52 mg SQ Once Weekly (not yet approved)  ?Appropriate, Query effective, Safe, Accessible ?Farxiga 10 mg once daily (PAP approved)  ?Appropriate, Query effective, Safe,  Accessible ?-Medications previously tried: metformin, other insulins   ?-Current home glucose readings. AVG 190s through CBG (equiv to a1c in the 8-9% range) ?-Denies hypoglycemic/hyperglycemic symptoms ?-Current m

## 2022-01-08 NOTE — Telephone Encounter (Signed)
?  Andrew Pruitt is calling to give zio critical result ref# [id number] ?

## 2022-01-08 NOTE — Patient Instructions (Signed)
Medication Instructions:  ?- Your physician recommends that you continue on your current medications as directed. Please refer to the Current Medication list given to you today. ? ?*If you need a refill on your cardiac medications before your next appointment, please call your pharmacy* ? ? ?Lab Work: ?- none ordered ? ?If you have labs (blood work) drawn today and your tests are completely normal, you will receive your results only by: ?MyChart Message (if you have MyChart) OR ?A paper copy in the mail ?If you have any lab test that is abnormal or we need to change your treatment, we will call you to review the results. ? ? ?Testing/Procedures: ?- none ordered ? ? ?Follow-Up: ?At [facility], you and your health needs are our priority.  As part of our continuing mission to provide you with exceptional heart care, we have created designated Provider Care Teams.  These Care Teams include your primary Cardiologist (physician) and Advanced Practice Providers (APPs -  Physician Assistants and Nurse Practitioners) who all work together to provide you with the care you need, when you need it. ? ?We recommend signing up for the patient portal called "MyChart".  Sign up information is provided on this After Visit Summary.  MyChart is used to connect with patients for Virtual Visits (Telemedicine).  Patients are able to view lab/test results, encounter notes, upcoming appointments, etc.  Non-urgent messages can be sent to your provider as well.   ?To learn more about what you can do with MyChart, go to [email or web address].   ? ?Your next appointment:   ?Tuesday 01/09/22 at 10:55 am   ? ?The format for your next appointment:   ?In Person ? ?Provider:   ?Murray Hodgkins, NP  ? ? ?Other Instructions ?N/a ? ?Important Information About Sugar ? ? ? ? ? ? ?

## 2022-01-08 NOTE — Telephone Encounter (Signed)
Home Health Verbal Orders - Caller/Agency:  ?[facility] ? ?Callback Number: [phone] ? ?Requesting OT/PT/Skilled Nursing/Social Work/Speech Therapy: PT ? ?Frequency: 2w4, 1w5 ?

## 2022-01-08 NOTE — Patient Instructions (Signed)
Visit Information ? ?The patient has expired. No further follow up. Condolences offered.  ? ? ?No further follow up required: patient has expired ? ?Noreene Larsson RN, MSN, CCM ?Community Care Coordinator ?[facility] ?[place] ?Mobile: [phone]  ?

## 2022-01-08 NOTE — Telephone Encounter (Signed)
I called and spoke with Lorriane Shire at [facility] regarding the patient's critical result. ? ?  ? ?Cardiac Monitor Alert ? ?Date of alert:  12/12/2021  ? ?Patient Name: Andrew Pruitt  ?DOB: 10/18/1955  ?MRN: [id number]  ? ?[facility] Cardiologist: Nelva Bush, MD  ?[facility] EP:  None   ? ?Monitor Information: ?Long Term Monitor-Live Telemetry [ZioAT]  ?Reason:  Embolic Strokes   ?Ordering provider:  Murray Hodgkins, NP ?  ?Alert ?[place]ades & Ventricular Fibrillation ?This is the 1st alert for this rhythm.  ? ?Next Cardiology Appointment {  ?Date:  01/09/22  Provider:  Murray Hodgkins, NP ? ?Also had an appointment in the office today with Ignacia Bayley, NP ? ?The patient's wife was contacted today by [employer]. They attempted to reach the patient initally with no response. The patient's wife advised [employer] that EMS was with the patient.   ? ? ?Other: ?Tracings uploaded and reviewed with Ignacia Bayley, NP- confirmed VF- immediately called the patient's wife. ?Upon answering the phone, I advised her the monitor company had contacted [place]. Per the patient wife "he passed and I am calling the funeral home." ? ?Will forward to Dr. Saunders Revel as an Juluis Rainier as well. ? ?Alvis Lemmings, RN  ?12/27/2021 3:06 PM  ? ?

## 2022-01-08 DEATH — deceased

## 2022-01-09 ENCOUNTER — Ambulatory Visit: Payer: Medicare Other | Admitting: Internal Medicine

## 2022-01-09 ENCOUNTER — Ambulatory Visit: Payer: Medicare Other | Admitting: Nurse Practitioner

## 2022-01-18 ENCOUNTER — Inpatient Hospital Stay: Payer: Medicare Other | Admitting: Nurse Practitioner

## 2022-03-05 ENCOUNTER — Other Ambulatory Visit: Payer: Self-pay | Admitting: Nurse Practitioner

## 2022-03-16 ENCOUNTER — Other Ambulatory Visit: Payer: Self-pay | Admitting: Nurse Practitioner
